# Patient Record
Sex: Male | Born: 1935 | Race: White | Hispanic: No | Smoking: Never smoker
Health system: Southern US, Community
[De-identification: ages and names within clinical notes are randomized; demographics above are authoritative.]

## PROBLEM LIST (undated history)

## (undated) DIAGNOSIS — R0601 Orthopnea: Secondary | ICD-10-CM

## (undated) DIAGNOSIS — K922 Gastrointestinal hemorrhage, unspecified: Secondary | ICD-10-CM

## (undated) DIAGNOSIS — J9610 Chronic respiratory failure, unspecified whether with hypoxia or hypercapnia: Secondary | ICD-10-CM

## (undated) DIAGNOSIS — H9190 Unspecified hearing loss, unspecified ear: Secondary | ICD-10-CM

## (undated) DIAGNOSIS — I10 Essential (primary) hypertension: Secondary | ICD-10-CM

## (undated) DIAGNOSIS — N189 Chronic kidney disease, unspecified: Secondary | ICD-10-CM

## (undated) DIAGNOSIS — I2510 Atherosclerotic heart disease of native coronary artery without angina pectoris: Secondary | ICD-10-CM

## (undated) DIAGNOSIS — R55 Syncope and collapse: Secondary | ICD-10-CM

## (undated) DIAGNOSIS — K219 Gastro-esophageal reflux disease without esophagitis: Secondary | ICD-10-CM

## (undated) DIAGNOSIS — E039 Hypothyroidism, unspecified: Secondary | ICD-10-CM

## (undated) DIAGNOSIS — E785 Hyperlipidemia, unspecified: Secondary | ICD-10-CM

## (undated) DIAGNOSIS — J449 Chronic obstructive pulmonary disease, unspecified: Secondary | ICD-10-CM

## (undated) DIAGNOSIS — M1990 Unspecified osteoarthritis, unspecified site: Secondary | ICD-10-CM

## (undated) DIAGNOSIS — D62 Acute posthemorrhagic anemia: Secondary | ICD-10-CM

## (undated) DIAGNOSIS — G4730 Sleep apnea, unspecified: Secondary | ICD-10-CM

## (undated) DIAGNOSIS — I5032 Chronic diastolic (congestive) heart failure: Secondary | ICD-10-CM

## (undated) DIAGNOSIS — J45909 Unspecified asthma, uncomplicated: Secondary | ICD-10-CM

## (undated) HISTORY — DX: Acute posthemorrhagic anemia: D62

## (undated) HISTORY — DX: Orthopnea: R06.01

## (undated) HISTORY — DX: Syncope and collapse: R55

## (undated) HISTORY — DX: Gastrointestinal hemorrhage, unspecified: K92.2

## (undated) HISTORY — DX: Chronic obstructive pulmonary disease, unspecified: J44.9

## (undated) HISTORY — PX: EXCISIONAL HEMORRHOIDECTOMY: SHX1541

## (undated) HISTORY — DX: Hyperlipidemia, unspecified: E78.5

## (undated) HISTORY — DX: Atherosclerotic heart disease of native coronary artery without angina pectoris: I25.10

## (undated) HISTORY — DX: Chronic diastolic (congestive) heart failure: I50.32

## (undated) HISTORY — DX: Chronic kidney disease, unspecified: N18.9

## (undated) HISTORY — DX: Essential (primary) hypertension: I10

---
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
## 1989-03-19 HISTORY — PX: KNEE ARTHROSCOPY: SHX127

## 1993-07-19 HISTORY — PX: CORONARY ARTERY BYPASS GRAFT: SHX141

## 1996-07-19 HISTORY — PX: PROSTATE SURGERY: SHX751

## 1998-02-28 ENCOUNTER — Ambulatory Visit: Admission: RE | Admit: 1998-02-28 | Discharge: 1998-02-28 | Payer: Self-pay | Admitting: Otolaryngology

## 1999-01-06 ENCOUNTER — Ambulatory Visit (HOSPITAL_COMMUNITY): Admission: RE | Admit: 1999-01-06 | Discharge: 1999-01-06 | Payer: Self-pay | Admitting: Family Medicine

## 1999-11-12 ENCOUNTER — Encounter (INDEPENDENT_AMBULATORY_CARE_PROVIDER_SITE_OTHER): Payer: Self-pay | Admitting: Specialist

## 1999-11-12 ENCOUNTER — Ambulatory Visit (HOSPITAL_COMMUNITY): Admission: RE | Admit: 1999-11-12 | Discharge: 1999-11-12 | Payer: Self-pay | Admitting: *Deleted

## 2003-07-20 HISTORY — PX: HYDROCELE EXCISION: SHX482

## 2004-03-10 ENCOUNTER — Encounter: Admission: RE | Admit: 2004-03-10 | Discharge: 2004-03-10 | Payer: Self-pay | Admitting: Surgery

## 2004-03-12 ENCOUNTER — Ambulatory Visit (HOSPITAL_COMMUNITY): Admission: RE | Admit: 2004-03-12 | Discharge: 2004-03-12 | Payer: Self-pay | Admitting: Obstetrics and Gynecology

## 2004-03-12 ENCOUNTER — Ambulatory Visit (HOSPITAL_BASED_OUTPATIENT_CLINIC_OR_DEPARTMENT_OTHER): Admission: RE | Admit: 2004-03-12 | Discharge: 2004-03-12 | Payer: Self-pay | Admitting: Obstetrics and Gynecology

## 2004-06-01 ENCOUNTER — Ambulatory Visit: Payer: Self-pay | Admitting: Cardiology

## 2004-06-25 ENCOUNTER — Ambulatory Visit (HOSPITAL_BASED_OUTPATIENT_CLINIC_OR_DEPARTMENT_OTHER): Admission: RE | Admit: 2004-06-25 | Discharge: 2004-06-25 | Payer: Self-pay | Admitting: Surgery

## 2004-06-25 ENCOUNTER — Ambulatory Visit (HOSPITAL_COMMUNITY): Admission: RE | Admit: 2004-06-25 | Discharge: 2004-06-25 | Payer: Self-pay | Admitting: Surgery

## 2004-07-19 HISTORY — PX: FUNCTIONAL ENDOSCOPIC SINUS SURGERY: SUR616

## 2004-07-19 HISTORY — PX: INGUINAL HERNIA REPAIR: SUR1180

## 2004-12-10 ENCOUNTER — Ambulatory Visit: Payer: Self-pay | Admitting: Cardiology

## 2004-12-17 ENCOUNTER — Ambulatory Visit: Payer: Self-pay

## 2005-04-02 ENCOUNTER — Ambulatory Visit (HOSPITAL_COMMUNITY): Admission: RE | Admit: 2005-04-02 | Discharge: 2005-04-03 | Payer: Self-pay | Admitting: Otolaryngology

## 2005-04-02 ENCOUNTER — Encounter (INDEPENDENT_AMBULATORY_CARE_PROVIDER_SITE_OTHER): Payer: Self-pay | Admitting: Specialist

## 2005-06-24 ENCOUNTER — Ambulatory Visit (HOSPITAL_COMMUNITY): Admission: RE | Admit: 2005-06-24 | Discharge: 2005-06-25 | Payer: Self-pay | Admitting: Otolaryngology

## 2005-06-24 ENCOUNTER — Encounter (INDEPENDENT_AMBULATORY_CARE_PROVIDER_SITE_OTHER): Payer: Self-pay | Admitting: Specialist

## 2006-01-03 ENCOUNTER — Ambulatory Visit: Payer: Self-pay | Admitting: Cardiology

## 2006-12-06 ENCOUNTER — Encounter (INDEPENDENT_AMBULATORY_CARE_PROVIDER_SITE_OTHER): Payer: Self-pay | Admitting: Otolaryngology

## 2006-12-06 ENCOUNTER — Ambulatory Visit (HOSPITAL_BASED_OUTPATIENT_CLINIC_OR_DEPARTMENT_OTHER): Admission: RE | Admit: 2006-12-06 | Discharge: 2006-12-06 | Payer: Self-pay | Admitting: Otolaryngology

## 2006-12-21 ENCOUNTER — Ambulatory Visit: Payer: Self-pay | Admitting: Cardiology

## 2007-06-21 ENCOUNTER — Ambulatory Visit (HOSPITAL_COMMUNITY): Admission: RE | Admit: 2007-06-21 | Discharge: 2007-06-21 | Payer: Self-pay | Admitting: Otolaryngology

## 2007-06-21 ENCOUNTER — Encounter (INDEPENDENT_AMBULATORY_CARE_PROVIDER_SITE_OTHER): Payer: Self-pay | Admitting: Otolaryngology

## 2007-10-10 ENCOUNTER — Ambulatory Visit: Payer: Self-pay

## 2007-12-18 ENCOUNTER — Ambulatory Visit: Payer: Self-pay | Admitting: Cardiology

## 2008-12-24 ENCOUNTER — Encounter: Payer: Self-pay | Admitting: Cardiology

## 2009-01-17 ENCOUNTER — Encounter: Payer: Self-pay | Admitting: Cardiology

## 2009-01-23 ENCOUNTER — Encounter: Payer: Self-pay | Admitting: Cardiology

## 2009-01-24 DIAGNOSIS — E669 Obesity, unspecified: Secondary | ICD-10-CM

## 2009-01-24 DIAGNOSIS — I10 Essential (primary) hypertension: Secondary | ICD-10-CM | POA: Insufficient documentation

## 2009-02-04 ENCOUNTER — Ambulatory Visit: Payer: Self-pay | Admitting: Cardiology

## 2009-12-16 ENCOUNTER — Telehealth: Payer: Self-pay | Admitting: Cardiology

## 2010-01-27 ENCOUNTER — Ambulatory Visit: Payer: Self-pay | Admitting: Cardiology

## 2010-01-27 DIAGNOSIS — I2510 Atherosclerotic heart disease of native coronary artery without angina pectoris: Secondary | ICD-10-CM | POA: Insufficient documentation

## 2010-01-27 DIAGNOSIS — I25810 Atherosclerosis of coronary artery bypass graft(s) without angina pectoris: Secondary | ICD-10-CM

## 2010-01-27 DIAGNOSIS — R0789 Other chest pain: Secondary | ICD-10-CM | POA: Insufficient documentation

## 2010-02-02 ENCOUNTER — Telehealth (INDEPENDENT_AMBULATORY_CARE_PROVIDER_SITE_OTHER): Payer: Self-pay | Admitting: *Deleted

## 2010-02-03 ENCOUNTER — Encounter: Payer: Self-pay | Admitting: Cardiology

## 2010-02-03 ENCOUNTER — Ambulatory Visit: Payer: Self-pay

## 2010-02-03 ENCOUNTER — Ambulatory Visit: Payer: Self-pay | Admitting: Cardiology

## 2010-02-03 ENCOUNTER — Encounter (HOSPITAL_COMMUNITY)
Admission: RE | Admit: 2010-02-03 | Discharge: 2010-04-07 | Payer: Self-pay | Source: Home / Self Care | Admitting: Cardiology

## 2010-02-04 ENCOUNTER — Ambulatory Visit: Payer: Self-pay

## 2010-08-03 ENCOUNTER — Encounter: Payer: Self-pay | Admitting: Cardiology

## 2010-08-04 ENCOUNTER — Encounter: Payer: Self-pay | Admitting: Physician Assistant

## 2010-08-06 ENCOUNTER — Ambulatory Visit
Admission: RE | Admit: 2010-08-06 | Discharge: 2010-08-06 | Payer: Self-pay | Source: Home / Self Care | Attending: Physician Assistant | Admitting: Physician Assistant

## 2010-08-06 ENCOUNTER — Encounter: Payer: Self-pay | Admitting: Physician Assistant

## 2010-08-06 DIAGNOSIS — R42 Dizziness and giddiness: Secondary | ICD-10-CM | POA: Insufficient documentation

## 2010-08-06 DIAGNOSIS — R0902 Hypoxemia: Secondary | ICD-10-CM | POA: Insufficient documentation

## 2010-08-06 DIAGNOSIS — R55 Syncope and collapse: Secondary | ICD-10-CM | POA: Insufficient documentation

## 2010-08-07 ENCOUNTER — Telehealth: Payer: Self-pay | Admitting: Physician Assistant

## 2010-08-10 ENCOUNTER — Encounter (INDEPENDENT_AMBULATORY_CARE_PROVIDER_SITE_OTHER): Payer: Self-pay

## 2010-08-10 ENCOUNTER — Encounter: Payer: Self-pay | Admitting: Physician Assistant

## 2010-08-10 ENCOUNTER — Ambulatory Visit (HOSPITAL_COMMUNITY)
Admission: RE | Admit: 2010-08-10 | Discharge: 2010-08-10 | Payer: Self-pay | Source: Home / Self Care | Attending: Cardiology | Admitting: Cardiology

## 2010-08-10 ENCOUNTER — Other Ambulatory Visit: Payer: Self-pay | Admitting: Physician Assistant

## 2010-08-10 ENCOUNTER — Ambulatory Visit: Admission: RE | Admit: 2010-08-10 | Discharge: 2010-08-10 | Payer: Self-pay | Source: Home / Self Care

## 2010-08-10 ENCOUNTER — Ambulatory Visit
Admission: RE | Admit: 2010-08-10 | Discharge: 2010-08-10 | Payer: Self-pay | Source: Home / Self Care | Attending: Physician Assistant | Admitting: Physician Assistant

## 2010-08-10 LAB — CBC WITH DIFFERENTIAL/PLATELET
Basophils Absolute: 0 10*3/uL (ref 0.0–0.1)
Basophils Relative: 0.4 % (ref 0.0–3.0)
Eosinophils Absolute: 0.2 10*3/uL (ref 0.0–0.7)
Eosinophils Relative: 3.6 % (ref 0.0–5.0)
HCT: 45.8 % (ref 39.0–52.0)
Lymphocytes Relative: 29.9 % (ref 12.0–46.0)
MCHC: 33.6 g/dL (ref 30.0–36.0)
MCV: 96.8 fl (ref 78.0–100.0)
Neutrophils Relative %: 57.2 % (ref 43.0–77.0)
Platelets: 203 10*3/uL (ref 150.0–400.0)
RBC: 4.73 Mil/uL (ref 4.22–5.81)
RDW: 13.8 % (ref 11.5–14.6)

## 2010-08-10 LAB — BASIC METABOLIC PANEL: Sodium: 135 mEq/L (ref 135–145)

## 2010-08-10 LAB — TSH: TSH: 1.79 u[IU]/mL (ref 0.35–5.50)

## 2010-08-11 ENCOUNTER — Encounter: Payer: Self-pay | Admitting: Physician Assistant

## 2010-08-13 ENCOUNTER — Ambulatory Visit (HOSPITAL_BASED_OUTPATIENT_CLINIC_OR_DEPARTMENT_OTHER)
Admission: RE | Admit: 2010-08-13 | Discharge: 2010-08-13 | Payer: Self-pay | Source: Home / Self Care | Attending: Critical Care Medicine | Admitting: Critical Care Medicine

## 2010-08-13 ENCOUNTER — Ambulatory Visit
Admission: RE | Admit: 2010-08-13 | Discharge: 2010-08-13 | Payer: Self-pay | Source: Home / Self Care | Attending: Critical Care Medicine | Admitting: Critical Care Medicine

## 2010-08-13 ENCOUNTER — Ambulatory Visit: Admit: 2010-08-13 | Payer: Self-pay | Admitting: Critical Care Medicine

## 2010-08-13 ENCOUNTER — Encounter: Payer: Self-pay | Admitting: Critical Care Medicine

## 2010-08-13 ENCOUNTER — Encounter: Payer: Self-pay | Admitting: Physician Assistant

## 2010-08-14 ENCOUNTER — Encounter: Payer: Self-pay | Admitting: Cardiology

## 2010-08-14 ENCOUNTER — Ambulatory Visit: Payer: Self-pay | Admitting: Internal Medicine

## 2010-08-18 ENCOUNTER — Encounter: Payer: Self-pay | Admitting: Critical Care Medicine

## 2010-08-18 ENCOUNTER — Ambulatory Visit
Admission: RE | Admit: 2010-08-18 | Discharge: 2010-08-18 | Payer: Self-pay | Source: Home / Self Care | Attending: Critical Care Medicine | Admitting: Critical Care Medicine

## 2010-08-19 ENCOUNTER — Ambulatory Visit: Payer: Self-pay | Admitting: Cardiology

## 2010-08-19 ENCOUNTER — Ambulatory Visit: Admit: 2010-08-19 | Payer: Self-pay | Admitting: Cardiology

## 2010-08-20 NOTE — Miscellaneous (Signed)
Summary: IV for Definity Echo  Clinical Lists Changes     IV 22 G angiocath (R) Wrist for Definity Echo.Keelin Neville,RN.

## 2010-08-20 NOTE — Assessment & Plan Note (Signed)
Summary: syncopal episodes/mt    Visit Type:  Follow-up Primary Provider:  Dr. Tenny Craw  CC:  PER PT HAS PASSED OUT  GLASSES WEARING AT THIS TIME ARE SCRATCHED  STATES THIS IS SECOND PAIR  FROM FALLING.  History of Present Illness: Primary Cardiologist:  Dr. Valera Castle  [name] is a 77 yo male with a h/o CAD, s/p CABG in 1995, HTN, DM2 and hyperlipidemia.  His last myoview was in 01/2010 and demonstrated an EF 55% and normal perfusion.  He presents today for evaluation of syncope.  He states he has been worked up for this before.  He states he had a syncopal episode before he last saw Dr. Daleen Squibb.  However, he reported chest pain to Dr. Daleen Squibb in July 2011.  As noted above, his myoview was negative for ischemia and he had a normal EF.  He states he typically passess out after walking for 5-10 minutes to go to his neighbor's house.  He denies assoc chest pain.  No arm or jaw pain.  He has some tingling in his left arm sometimes.  He denies coughing or wheezing.  He has slept in a recliner for years.  He denies a significant h/o smoking.  He states he has been evaluated for sleep apnea in the past.  He does not have sleep apnea but states he has a "breathing machine."  He describes a spinning sensation at times as well.  He has no warning prior to the syncopal episodes.  He states he is followed on his face several times with this.  He was apparently worked up in a hospital in [place] several years ago.  He denies knowing what types of testing was done.  Current Medications (verified): 1)  Diovan Hct 160-12.5 Mg Tabs (Valsartan-Hydrochlorothiazide) .Marland Kitchen.. 1 1/2 Tabs Every Morning 2)  Metoprolol Tartrate 100 Mg Tabs (Metoprolol Tartrate) .Marland Kitchen.. 1 Tab Two Times A Day 3)  Lipitor 10 Mg Tabs (Atorvastatin Calcium) .Marland Kitchen.. 1 Tab At Bedtime 4)  Aspirin Ec 325 Mg Tbec (Aspirin) .... Take One Tablet By Mouth Daily 5)  Multivitamins   Tabs (Multiple Vitamin) .Marland Kitchen.. 1 Tab Once Daily 6)  Bonine 25 Mg Chew (Meclizine  Hcl) .Marland Kitchen.. 1 Tab Two Times A Day 7)  Nitrostat 0.4 Mg Subl (Nitroglycerin) .Marland Kitchen.. 1 Tablet Under Tongue At Onset of Chest Pain; You May Repeat Every 5 Minutes For Up To 3 Doses.  Allergies: No Known Drug Allergies  Past History:  Past Medical History: Last updated: 01/24/2009 CAD, UNSPECIFIED SITE (ICD-414.00) HYPERTENSION, UNSPECIFIED (ICD-401.9) HYPERLIPIDEMIA-MIXED (ICD-272.4) DIABETES MELLITUS, TYPE II (ICD-250.00) OBESITY (ICD-278.00)    Past Surgical History: Reviewed history from 01/24/2009 and no changes required. CABG..1995 Frozen section basal cell margins clear, right brow frozen section negative for melanoma.  Resection of the above-mentioned lesions.   06/21/2007 .Marland Kitchen Hermelinda Medicus, M.D.  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 Hermelinda Medicus, M.D.  Functional endoscopic sinus surgery  06/24/2005 ..04/02/05.. SURGEON:  Hermelinda Medicus, M.D. Right hydrocele resection with marsupialization of sac. SURGEON:  Sandria Bales. Newman, M.D..06/25/2004 Right inguinal herniorrhaphy with mesh, marsupialization of the right scrotal hydrocele. SURGEON:  Sandria Bales. Ezzard Standing, M.D. 03/12/2004 History of prostate surgery in 1998.          Social History: Reviewed history from 01/24/2009 and no changes required. Tobacco Use - No.  Alcohol Use - no Drug Use -  no  Review of Systems       As per  the HPI.  All other systems reviewed and negative.   Vital Signs:  Patient profile:   77 year old male Height:      71 inches Weight:      306.50 pounds O2 Sat:      82 % on Room air Pulse (ortho):   78 / minute BP standing:   156 / 95  Vitals Entered By: Micki Riley CNA (August 06, 2010 3:49 PM)  O2 Flow:  Room air  Serial Vital Signs/Assessments:  Time      Position  BP       Pulse  Resp  Temp     By           Lying RA   201/123  76                    Scherrie Bateman, LPN           Sitting   152/96   [address] LPN           Standing  156/95   78                    Scherrie Bateman, LPN   Physical Exam  General:  Well nourished, well developed, in no acute distress HEENT: normal Neck: no JVD Cardiac:  normal S1, S2; RRR; no murmur Lungs:  decreased breath sounds bilat; no rale or wheezing Abd: soft, nontender, no hepatomegaly Ext: trace edema Vascular: no carotid  bruits or supraclavicular bruits Skin: warm and dry Neuro:  CNs 2-12 intact, no focal abnormalities noted    EKG  Procedure date:  08/06/2010  Findings:      normal sinus rhythm Heart rate 73 Left axis Increased artifact First degree AV block with a PR interval of 244 ms Poor R-wave progression Nonspecific ST-T wave changes No significant change when compared to prior tracing  Impression & Recommendations:  Problem # 1:  SYNCOPE AND COLLAPSE (ICD-780.2) I ambulated the patient to the office while monitoring his oxygen saturation.  His O2 sats started about 92% and dropped to  80% with walking approximately 100 feet.  I placed him on O2 at 2LPM on nasal canula.  His sats came up to 93% with this.  He did feel somewhat lightheaded.  I repeated his orthostatic blood pressures.  His blood pressures are hard to hear, but on palpation his systolic blood pressure did not drop significantly from lying to standing.  I did lay him back in a modified Hallpike maneuver.  He did have some spinning sensation with turning his head to the left.  His EKG shows sinus rhythm with a first degree AV block.  This is not significantly different.  He had normal LV function on a Myoview study in July 2011.  It appears that his syncopal episodes are related to hypoxia.  I will arrange for him to have home oxygen.  I am unsure what pulmonary diagnosis he already has.  He has what sounds like a nebulizer at home.  I will set him up for a  chest CT with contrast to rule out the possibility of pulmonary embolism.  I will also check an echocardiogram to assess his LV function as well as his RV pressures.  He will have labs  to include a basic metabolic panel, CBC, TSH and a BNP.  He will have pulmonary function tests with DLCO.  I will set him up for a 24-hour Holter to rule out the possibility of arrhythmia although this seems less likely.  I will refer him to pulmonology for further evaluation.  I will also bring him back in close followup with Dr. Daleen Squibb.  He has been advised to do no driving for now.  I discussed his case with Dr Ladona Ridgel who agreed with the above.  Problem # 2:  HYPOXEMIA (ICD-799.02) As above.  Will work up pulmonary process.  Orders: Pulmonary Referral (Pulmonary) Pulmonary Function Test (PFT) CT Scan  (CT Scan) Misc. Referral (Misc. Ref)  Problem # 3:  CAD, NATIVE VESSEL (ICD-414.01) Continue ASA.  He is not having angina.  Recent myoview negative.  Orders: EKG w/ Interpretation (93000)  Problem # 4:  HYPERTENSION, UNSPECIFIED (ICD-401.9) Elevated.  But, will continue current meds for now.  Orders: EKG w/ Interpretation (93000)  Other Orders: Holter (Holter) Echocardiogram (Echo)  Patient Instructions: 1)  Your physician recommends that you schedule a follow-up appointment in: 2 WEEKS WITH DR WALL 2)  Your physician recommends that you return for lab work ZO:[id number]  BMET BNP TSH CBC 3)  Your physician recommends that you continue on your current medications as directed. Please refer to the Current Medication list given to you today. 4)  You have been referred to PULMONARY  HYPOXIA 5)  Non-Cardiac CT scanning, (CAT scanning), is a noninvasive, special x-ray that produces cross-sectional images of the body using x-rays and a computer. CT scans help physicians diagnose and treat medical conditions. For some CT exams, a contrast material is used to enhance visibility in the area of the body being studied.  CT scans provide greater clarity and reveal more details than regular x-ray exams. 6)  Your physician has recommended that you wear a holter monitor.  Holter monitors are medical devices that record the heart's electrical activity. Doctors most often use these monitors to diagnose arrhythmias. Arrhythmias are problems with the speed or rhythm of the heartbeat. The monitor is a small, portable device. You can wear one while you do your normal daily activities. This is usually used to diagnose what is causing palpitations/syncope (passing out).24 HOUR  7)  Your physician has recommended that you have a pulmonary function test.  Pulmonary Function Tests are a group of tests that measure how well air moves in and out of your lungs. WITH DLCO 8)  Your physician has requested that you have an echocardiogram.  Echocardiography is a painless test that uses sound waves to create images of your heart. It provides your doctor with information about the size and shape of your heart and how well your heart's chambers and valves are working.  This procedure takes approximately one hour. There are no restrictions for this procedure. 9)  MAY NOT DRIVE  AT THIS TIME 10)  ALSO NEEDS EVALUATION FOR HOME 02

## 2010-08-20 NOTE — Progress Notes (Signed)
Summary: pt told to come in today   Phone Note Outgoing Call   Caller: Patient [phone] Reason for Call: Talk to Nurse Summary of Call: pt was told to come back in today-pls call Initial call taken by: Glynda Jaeger,  August 07, 2010 9:10 AM Summary of Call: CMA [facility] for ptcb to verify if he had labs done 08/06/10 or not. If not, he needs to come in today and get labs done. Danielle Rankin, CMA  August 07, 2010 9:23 AM     Follow-up for Phone Call        CMA s/w Merita Norton in our office today who states pt will be coming in on Monday to have his blood work done since he is coming in Monday to have other test done in the office, CMA states that is fine. Danielle Rankin, CMA  August 07, 2010 11:02 AM

## 2010-08-20 NOTE — Progress Notes (Signed)
Summary: Nuclear Pre-Procedure  Phone Note Outgoing Call Call back at [facility] Phone [phone]   Call placed by: Stanton Kidney, EMT-P,  February 02, 2010 2:20 PM Call placed to: Patient Action Taken: Phone Call Completed Summary of Call: Reviewed information on Myoview Information Sheet (see scanned document for further details).  Spoke with Patient.    Nuclear Med Background Indications for Stress Test: Evaluation for Ischemia, Graft Patency   History: CABG, Heart Catheterization, Myocardial Perfusion Study  History Comments: '95 CABG '09 MPS: EF=65%  Symptoms: Chest Tightness, Chest Tightness with Exertion    Nuclear Pre-Procedure Cardiac Risk Factors: Hypertension, Lipids, NIDDM, Obesity Height (in): 71

## 2010-08-20 NOTE — Progress Notes (Signed)
Summary: rx lipitor..diovan...metoprolol tart   Phone Note Refill Request Message from:  Patient on Dec 16, 2009 11:39 AM  Refills Requested: Medication #1:  LIPITOR 10 MG TABS 1 tab at bedtime  Medication #2:  DIOVAN HCT 160-12.5 MG TABS 1 1/2 tabs every morning  Medication #3:  METOPROLOL TARTRATE 100 MG TABS 1 tab two times a day [employer]  Initial call taken by: Judie Grieve,  Dec 16, 2009 11:40 AM    Prescriptions: LIPITOR 10 MG TABS (ATORVASTATIN CALCIUM) 1 tab at bedtime  #30 x 11   Entered by:   Danielle Rankin, CMA   Authorized by:   Gaylord Shih, MD, [facility]   Signed by:   Danielle Rankin, CMA on 12/16/2009   Method used:   Electronically to        [facility]  [phone]* (retail)       [address]       [place], [place]  [address]       Ph: [phone] or [phone]       Fax: [phone]   RxID:   [id number] METOPROLOL TARTRATE 100 MG TABS (METOPROLOL TARTRATE) 1 tab two times a day  #180 x 11   Entered by:   Danielle Rankin, CMA   Authorized by:   Gaylord Shih, MD, [facility]   Signed by:   Danielle Rankin, CMA on 12/16/2009   Method used:   Electronically to        [facility]  [phone]* (retail)       [address]       [place], [place]  [address]       Ph: [phone] or [phone]       Fax: [phone]   RxID:   [id number] DIOVAN HCT 160-12.5 MG TABS (VALSARTAN-HYDROCHLOROTHIAZIDE) 1 1/2 tabs every morning  #45 x 11   Entered by:   Danielle Rankin, CMA   Authorized by:   Gaylord Shih, MD, [facility]   Signed by:   Danielle Rankin, CMA on 12/16/2009   Method used:   Electronically to        [facility]  [phone]* (retail)       [address]       [place], [place]  [address]       Ph: [phone] or [phone]       Fax: [phone]   RxID:   [id number]

## 2010-08-20 NOTE — Assessment & Plan Note (Signed)
Summary: Cardiology Nuclear Study  Nuclear Med Background Indications for Stress Test: Evaluation for Ischemia, Graft Patency   History: CABG, Heart Catheterization, Myocardial Perfusion Study  History Comments: '95 CABG '09 MPS: EF=65%  Symptoms: Chest Tightness, Chest Tightness with Exertion    Nuclear Pre-Procedure Cardiac Risk Factors: Hypertension, Lipids, NIDDM, Obesity Caffeine/Decaff Intake: None NPO After: 6:00 PM Lungs: clear IV 0.9% NS with Angio Cath: 22g     IV Site: (R) Wrist IV Started by: Irean Hong RN Chest Size (in) 54     Height (in): 71 Weight (lb): 299 BMI: 41.85 Tech Comments: Held metoprolol this am.  Patient had Albuterol Inhaler 2 puffs prior to the [place].  Nuclear Med Study 1 or 2 day study:  2 day     Stress Test Type:  Eugenie Birks Reading MD:  Willa Rough, MD     Referring MD:  S.Klein Resting Radionuclide:  Technetium 48m Tetrofosmin     Resting Radionuclide Dose:  33.0 mCi  Stress Radionuclide:  Technetium 106m Tetrofosmin     Stress Radionuclide Dose:  33.0 mCi   Stress Protocol   Lexiscan: 0.4 mg   Stress Test Technologist:  Milana Na EMT-P     Nuclear Technologist:  Harlow Asa CNMT  Rest Procedure  Myocardial perfusion imaging was performed at rest 45 minutes following the intravenous administration of Myoview Technetium 40m Tetrofosmin.  Stress Procedure  The patient received IV Lexiscan 0.4 mg over 15-seconds.  Myoview injected at 30-seconds.  There were no significant changes with infusion.  Quantitative spect images were obtained after a 45 minute delay.  QPS Raw Data Images:  Normal; no motion artifact; normal heart/lung ratio. Stress Images:  No diagnostic abnormalities. Rest Images:  Same as stress Subtraction (SDS):  No evidence of ischemia. Transient Ischemic Dilatation:  .97  (Normal <1.22)  Lung/Heart Ratio:  .33  (Normal <0.45)  Quantitative Gated Spect Images QGS EDV:  126 ml QGS ESV:  57 ml QGS EF:   55 % QGS cine images:  Septal dyssynergy c/w prior CABG  Findings Normal nuclear study      Overall Impression  Exercise Capacity: Lexiscan BP Response: Normal blood pressure response. Clinical Symptoms: numbness ECG Impression: No significant ST segment change suggestive of ischemia. Overall Impression: Normal stress nuclear study.  Appended Document: Cardiology Nuclear Study excellent. no change.  Appended Document: Cardiology Nuclear Study pt's wife aware

## 2010-08-20 NOTE — Miscellaneous (Signed)
Summary: Orders Update pft charges   Clinical Lists Changes  Orders: Added new Service order of Carbon Monoxide diffusing w/capacity (94729) - Signed Added new Service order of Lung Volumes/Gas dilution or washout (94727) - Signed Added new Service order of Spirometry (Pre & Post) (94060) - Signed 

## 2010-08-20 NOTE — Assessment & Plan Note (Signed)
Summary: F1Y/ANAS  Medications Added NITROSTAT 0.4 MG SUBL (NITROGLYCERIN) 1 tablet under tongue at onset of chest pain; you may repeat every 5 minutes for up to 3 doses.      Allergies Added: NKDA  Visit Type:  1 yr f/u Primary Provider:  Dr. Tenny Craw  CC:  chest pain...sob...edema/ankles..pt states he had a pain that radiated down the left side from his face downward...says he cannot pick anything up w/left arm w/o pain.  History of Present Illness: [name] returns today for evaluation and management of his coronary artery disease.  About 3 weeks ago, while working around the yard, and chest tightness with some aching up in his left face and left shoulder. It lasted for about 10 minutes. He did not have any fresh nitroglycerin.  He's not had any recurrent symptoms since then.  He does have an aching in his left arm when he picks up something. He denies any presyncope or presyncope with use of his left arm. He sometimes has aches in his left shoulder.  He is now followed by Dr. Tenny Craw.  His blood pressure at home runs about 1:30 to 150 over about 70-80.  He denies orthopnea, PND or significant edema.  Current Medications (verified): 1)  Diovan Hct 160-12.5 Mg Tabs (Valsartan-Hydrochlorothiazide) .Marland Kitchen.. 1 1/2 Tabs Every Morning 2)  Metoprolol Tartrate 100 Mg Tabs (Metoprolol Tartrate) .Marland Kitchen.. 1 Tab Two Times A Day 3)  Lipitor 10 Mg Tabs (Atorvastatin Calcium) .Marland Kitchen.. 1 Tab At Bedtime 4)  Aspirin Ec 325 Mg Tbec (Aspirin) .... Take One Tablet By Mouth Daily 5)  Multivitamins   Tabs (Multiple Vitamin) .Marland Kitchen.. 1 Tab Once Daily 6)  Bonine 25 Mg Chew (Meclizine Hcl) .Marland Kitchen.. 1 Tab Two Times A Day  Allergies (verified): No Known Drug Allergies  Past History:  Past Medical History: Last updated: 01/24/2009 CAD, UNSPECIFIED SITE (ICD-414.00) HYPERTENSION, UNSPECIFIED (ICD-401.9) HYPERLIPIDEMIA-MIXED (ICD-272.4) DIABETES MELLITUS, TYPE II (ICD-250.00) OBESITY (ICD-278.00)    Past Surgical  History: Last updated: 01/24/2009 CABG..1995 Frozen section basal cell margins clear, right brow frozen section negative for melanoma.  Resection of the above-mentioned lesions.   06/21/2007 .Marland Kitchen Hermelinda Medicus, M.D.  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 Hermelinda Medicus, M.D.  Functional endoscopic sinus surgery  06/24/2005 ..04/02/05.. SURGEON:  Hermelinda Medicus, M.D. Right hydrocele resection with marsupialization of sac. SURGEON:  Sandria Bales. Newman, M.D..06/25/2004 Right inguinal herniorrhaphy with mesh, marsupialization of the right scrotal hydrocele. SURGEON:  Sandria Bales. Ezzard Standing, M.D. 03/12/2004 History of prostate surgery in 1998.          Family History: Last updated: 01/24/2009 noncontributory  Social History: Last updated: 01/24/2009 Tobacco Use - No.  Alcohol Use - no Drug Use - no  Risk Factors: Smoking Status: never (01/24/2009)  Review of Systems       negative other than history of present illness  Vital Signs:  Patient profile:   77 year old male Height:      71 inches Weight:      300 pounds BMI:     41.99 Pulse rate:   79 / minute Pulse rhythm:   irregular BP sitting:   162 / 90  (left arm) Cuff size:   large  Vitals Entered By: Danielle Rankin, CMA (January 27, 2010 9:35 AM)  Physical Exam  General:  obese.  chronically ill-appearing, in no acute distress Head:  normocephalic  and atraumatic Eyes:  injected, otherwise normal Neck:  Neck supple, no JVD. No masses, thyromegaly or abnormal cervical nodes. Chest Raysha Tilmon:  no deformities or breast masses noted Lungs:  Clear bilaterally to auscultation and percussion. Heart:  poorly appreciated PMI, soft S1-S2, no significant murmur. No bruits Abdomen:  markedly overweight, difficult to assess but no gross abnormality Msk:  decreased ROM.   Pulses:  one  plus over 4+ dorsalis pedis and posterior tibial. Decreased capillary refill with some dependent rubor Extremities:  trace left pedal edema and trace right pedal edema.   Neurologic:  Alert and oriented x 3. Skin:  Intact without lesions or rashes. Psych:  Normal affect.   Problems:  Medical Problems Added: 1)  Dx of Cad, Artery Bypass Graft  (ICD-414.04) 2)  Dx of Cad, Native Vessel  (ICD-414.01) 3)  Dx of Other Chest Pain  (ZOX-096.04)  EKG  Procedure date:  01/27/2010  Findings:      normal sinus rhythm first-degree block, left axis deviation, old inferior Estalene Bergey infarct, poor R-wave progression, no acute changes.  Impression & Recommendations:  Problem # 1:  OTHER CHEST PAIN (ICD-786.59) Assessment New He has had one episode of discomfort this worrisome for angina. It is no long time since his head objective assessment of his coronary disease. We'll obtain Myoview for risk stratification. Hopefully can continue medical therapy. His updated medication list for this problem includes:    Metoprolol Tartrate 100 Mg Tabs (Metoprolol tartrate) .Marland Kitchen... 1 tab two times a day    Aspirin Ec 325 Mg Tbec (Aspirin) .Marland Kitchen... Take one tablet by mouth daily    Nitrostat 0.4 Mg Subl (Nitroglycerin) .Marland Kitchen... 1 tablet under tongue at onset of chest pain; you may repeat every 5 minutes for up to 3 doses.  Orders: Nuclear Stress Test (Nuc Stress Test)  Problem # 2:  CAD, NATIVE VESSEL (ICD-414.01)  His updated medication list for this problem includes:    Metoprolol Tartrate 100 Mg Tabs (Metoprolol tartrate) .Marland Kitchen... 1 tab two times a day    Aspirin Ec 325 Mg Tbec (Aspirin) .Marland Kitchen... Take one tablet by mouth daily    Nitrostat 0.4 Mg Subl (Nitroglycerin) .Marland Kitchen... 1 tablet under tongue at onset of chest pain; you may repeat every 5 minutes for up to 3 doses.  Problem # 3:  CAD, ARTERY BYPASS GRAFT (ICD-414.04) Assessment: Unchanged  His updated medication list for this problem includes:    Metoprolol Tartrate  100 Mg Tabs (Metoprolol tartrate) .Marland Kitchen... 1 tab two times a day    Aspirin Ec 325 Mg Tbec (Aspirin) .Marland Kitchen... Take one tablet by mouth daily    Nitrostat 0.4 Mg Subl (Nitroglycerin) .Marland Kitchen... 1 tablet under tongue at onset of chest pain; you may repeat every 5 minutes for up to 3 doses.  Problem # 4:  HYPERTENSION, UNSPECIFIED (ICD-401.9)  His updated medication list for this problem includes:    Diovan Hct 160-12.5 Mg Tabs (Valsartan-hydrochlorothiazide) .Marland Kitchen... 1 1/2 tabs every morning    Metoprolol Tartrate 100 Mg Tabs (Metoprolol tartrate) .Marland Kitchen... 1 tab two times a day    Aspirin Ec 325 Mg Tbec (Aspirin) .Marland Kitchen... Take one tablet by mouth daily  Problem # 5:  OBESITY (ICD-278.00) Assessment: Unchanged  Problem # 6:  HYPERLIPIDEMIA-MIXED (ICD-272.4)  His updated medication list for this problem includes:    Lipitor 10 Mg Tabs (Atorvastatin calcium) .Marland Kitchen... 1 tab at bedtime  Other Orders: EKG w/ Interpretation (93000)  Clinical Reports Reviewed:  Nuclear Study:  10/10/2007:  Excerise capacity: Adenosine  study with no exercise  Blood Pressure response:  Clinical symptoms:  ECG impression: No significant ST segment change suggestive of ischemia  Overall impression: There is no sign of scar or ischemia.  Olga Millers, MD   12/17/2004:  Impression: Probable negative pharmacologic stress Myoview study revealing no significant stress-iduced EKG abnormalities, normal left ventricualr size, and normal overall left ventricular systolic function. By scintigraphic imaging, there was prominent physiologic apical thinning but no convincing evidence for myocardial ischemia or infarction. A minor degree of scarring at the cardiac apex could not be unequivocally excluded. When compared to a prior study performed Dec 06, 2003, there has been no significant interval change.  [place] Bing, MD, [facility]   Patient Instructions: 1)  Your physician recommends that you schedule a follow-up appointment in: 6  months with Dr. Daleen Squibb 2)  Your physician recommends that you continue on your current medications as directed. Please refer to the Current Medication list given to you today. 3)  Your physician has requested that you have an lexiscan myoview.  For further information please visit [email or web address]  Please follow instruction sheet, as given. Prescriptions: NITROSTAT 0.4 MG SUBL (NITROGLYCERIN) 1 tablet under tongue at onset of chest pain; you may repeat every 5 minutes for up to 3 doses.  #25 x 6   Entered by:   Lisabeth Devoid RN   Authorized by:   Gaylord Shih, MD, [facility]   Signed by:   Lisabeth Devoid RN on 01/27/2010   Method used:   Electronically to        [facility]  [phone]* (retail)       [address]       [place], [place]  [address]       Ph: [phone] or [phone]       Fax: [phone]   RxID:   [id number]

## 2010-08-20 NOTE — Assessment & Plan Note (Addendum)
Summary: Pulmonary Consultation   Visit Type:  Initial Consult Primary Provider/Referring Provider:  Dr. Tenny Craw  CC:  consult sob, dizziness with exertion had pftss today  was referred by Dr Delford Field, and COPD initial evaluation.  History of Present Illness: Pulmonary Consultation       This is a 77 year old man who presents for dyspnea and syncope  initial evaluation.  The patient complains of shortness of breath, chest tightness, wheezing, and cough, but denies history of diagnosed COPD, chest pain worse with breathing and coughing, mucous production, nocturnal awakening, exercise induced symptoms, and congestion.  Prior evaluation and testing has included Cxr.  The dyspnea is described as at rest, with ADL's, with walking within room, with walking one or two blocks, with walking stairs, and during the day.  Associated disease(s) include(s) productive cough and non-productive cough.  Oxygen evaluation is described as on supplemental O2; _2_ l/min rest but pt is noncompliant with oxygen.  Prior effective treatment has included short acting beta agonists.    THis pt has been passing out for  3-4 times.  The pt notes if walk across the road will pass out THe is not dyspneic at rest  or exertion.   THe pt is dizzy on exertion. The pt has tried rx oxygen at home.  He has not been compliant  Preventive Screening-Counseling & Management  Alcohol-Tobacco     Smoking Status: quit  Current Medications (verified): 1)  Diovan Hct 160-12.5 Mg Tabs (Valsartan-Hydrochlorothiazide) .Marland Kitchen.. 1 1/2 Tabs Every Morning 2)  Metoprolol Tartrate 100 Mg Tabs (Metoprolol Tartrate) .Marland Kitchen.. 1 Tab Two Times A Day 3)  Lipitor 10 Mg Tabs (Atorvastatin Calcium) .Marland Kitchen.. 1 Tab At Bedtime 4)  Aspirin Ec 325 Mg Tbec (Aspirin) .... Take One Tablet By Mouth Daily 5)  Multivitamins   Tabs (Multiple Vitamin) .Marland Kitchen.. 1 Tab Once Daily 6)  Bonine 25 Mg Chew (Meclizine Hcl) .Marland Kitchen.. 1 Tab Two Times A Day 7)  Nitrostat 0.4 Mg Subl (Nitroglycerin)  .Marland Kitchen.. 1 Tablet Under Tongue At Onset of Chest Pain; You May Repeat Every 5 Minutes For Up To 3 Doses.  Allergies (verified): No Known Drug Allergies  Past History:  Past medical, surgical, family and social histories (including risk factors) reviewed, and no changes noted (except as noted below).  Past Medical History: CAD, UNSPECIFIED SITE (ICD-414.00) HYPERTENSION, UNSPECIFIED (ICD-401.9) HYPERLIPIDEMIA-MIXED (ICD-272.4) OBESITY (ICD-278.00) Echo 07/2010: EF 60-65%; mild LAE; normal LV wall thickness  Syncope   a. Holter 08/10/2010 . . . NSR, sinus brady; no significant arrhythmias  Past Surgical History: Reviewed history from 01/24/2009 and no changes required. CABG..1995 Frozen section basal cell margins clear, right brow frozen section negative for melanoma.  Resection of the above-mentioned lesions.   06/21/2007 .Marland Kitchen Hermelinda Medicus, M.D.  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 Hermelinda Medicus, M.D.  Functional endoscopic sinus surgery  06/24/2005 ..04/02/05.. SURGEON:  Hermelinda Medicus, M.D. Right hydrocele resection with marsupialization of sac. SURGEON:  Sandria Bales. Newman, M.D..06/25/2004 Right inguinal herniorrhaphy with mesh, marsupialization of the right scrotal hydrocele. SURGEON:  Sandria Bales. Ezzard Standing, M.D. 03/12/2004 History of prostate surgery in 1998.          Family History: Reviewed history from 01/24/2009 and no changes required. noncontributory  Social History: Reviewed history from 01/24/2009 and no changes required. Tobacco Use - No.  Alcohol Use - no Drug Use - no  Occupation- retired Environmental consultant equipSmoking Status:  quit  Review of Systems       The patient complains of shortness of breath with activity, shortness of breath at rest, productive cough, indigestion, difficulty swallowing,  itching, and hand/feet swelling.  The patient denies non-productive cough, coughing up blood, chest pain, irregular heartbeats, loss of appetite, weight change, abdominal pain, sore throat, tooth/dental problems, headaches, nasal congestion/difficulty breathing through nose, sneezing, ear ache, anxiety, depression, joint stiffness or pain, rash, change in color of mucus, and fever.         pt remains light headed and dizzy      See HPI for Pulmonary, Cardiac, General, and ENT review of systems.  Vital Signs:  Patient profile:   77 year old male Height:      71 inches Weight:      306 pounds BMI:     42.83 O2 Sat:      95 % on Room air Pulse rate:   80 / minute BP sitting:   150 / 100  (right arm) Cuff size:   large  Vitals Entered By: Gweneth Dimitri RN (August 13, 2010 4:08 PM)  O2 Flow:  Room air  Physical Exam  Additional Exam:  Gen: Pleasant, well-nourished, in no distress,  normal affect ENT: No lesions,  mouth clear,  oropharynx clear, no postnasal drip Neck: No JVD, no TMG, no carotid bruits Lungs: No use of accessory muscles, no dullness to percussion, distant BS  Cardiovascular: RRR, heart sounds normal, no murmur or gallops, no peripheral edema Abdomen: soft and NT, no HSM,  BS norma Musculoskeletal: No deformities, no cyanosis or clubbing Neuro: alert, non focal Skin: Warm, no lesions or rashes    CXR  Procedure date:  08/13/2010  Findings:      Findings: Mild cardiac enlargement.   Status post median sternotomy and CABG procedure.   No airspace consolidation identified.   There is diffuse pulmonary venous congestion and left basilar atelectasis.   IMPRESSION:   1.  Mild cardiac enlargement and pulmonary venous congestion. 2.  Left basilar atelectasis.   Read By:  Rosealee Albee,  M.D.     Released By:  Rosealee Albee,  M.D.   Signed by Storm Frisk MD on 08/13/2010 at 5:29 PM  _____________________  CT of Chest  Procedure date:   08/14/2010  Findings:      Findings:  No filling defect is identified in the pulmonary arterial tree to suggest pulmonary embolus.   Prior CABG noted.  There is mild cardiomegaly.   An AP window lymph node has a short axis diameter of 8 mm.   A 5 mm in diameter pulmonary nodules present the right lower lobe on image 37 of series 6.   There is evidence of old granulomatous disease along the left major fissure.   Review of the MIP images confirms the above findings.   IMPRESSION:   1.  No embolus identified. 2.  5 mm right lower lobe pulmonary nodule. If the patient is at high risk for bronchogenic carcinoma, follow-up chest CT at 6-12 months is recommended.  If the patient is at low risk for bronchogenic carcinoma, follow-up chest CT at 12 months is recommended.  This recommendation follows the consensus statement: Guidelines for Management of Small Pulmonary Nodules Detected on CT Scans: A Statement from the Fleischner Society as published in Radiology 2005; 237:395-400. Online at: DietDisorder.cz. 3.  Mild cardiomegaly and prior CABG.  Impression & Recommendations:  Problem #  1:  COPD (ICD-496) Assessment Deteriorated Severe copd with significant airway obstruction, exertional desat to dangerously low levels and  extremely high dosing of non selective beta blockade note CT chest neg for PE note pfts show combined severe obstruction and restriction due to obesity plan d/c metoprolol start bystolic 20mg  /d Prednisone 4 each am x 4 days, 3 x 4 days, 2 x 4 days, 1 x 4 days then stop USe advair 250 one puff two times a day Use oxygen 3L rest  4 L exertion, will call [facility] note cxr obtained today revealed no active disease  Return one week  Medications Added to Medication List This Visit: 1)  Bystolic 20 Mg Tabs (Nebivolol hcl) .... One by mouth daily 2)  Advair Diskus 250-50 Mcg/dose Misc (Fluticasone-salmeterol) .... One puff twice  daily 3)  Prednisone 10 Mg Tabs (Prednisone) .... Take as directed 4 each am x 4 days, 3 x 4 days, 2 x 4 days, 1 x 4 days then stop 4)  Oxygen  .Marland Kitchen.. 3l rest ;  4l exertion  Complete Medication List: 1)  Diovan Hct 160-12.5 Mg Tabs (Valsartan-hydrochlorothiazide) .Marland Kitchen.. 1 1/2 tabs every morning 2)  Bystolic 20 Mg Tabs (Nebivolol hcl) .... One by mouth daily 3)  Lipitor 10 Mg Tabs (Atorvastatin calcium) .Marland Kitchen.. 1 tab at bedtime 4)  Aspirin Ec 325 Mg Tbec (Aspirin) .... Take one tablet by mouth daily 5)  Multivitamins Tabs (Multiple vitamin) .Marland Kitchen.. 1 tab once daily 6)  Bonine 25 Mg Chew (Meclizine hcl) .Marland Kitchen.. 1 tab two times a day 7)  Nitrostat 0.4 Mg Subl (Nitroglycerin) .Marland Kitchen.. 1 tablet under tongue at onset of chest pain; you may repeat every 5 minutes for up to 3 doses. 8)  Advair Diskus 250-50 Mcg/dose Misc (Fluticasone-salmeterol) .... One puff twice daily 9)  Prednisone 10 Mg Tabs (Prednisone) .... Take as directed 4 each am x 4 days, 3 x 4 days, 2 x 4 days, 1 x 4 days then stop 10)  Oxygen  .Marland Kitchen.. 3l rest ;  4l exertion  Other Orders: New Patient Level V (40981) T-2 View CXR (71020TC) DME Referral (DME) .Ambulatory Pulse Oximetry  Resting; HR__81   ___    02 Sat_93____  Lap1 (185 feet)   HR_93____   02 Sat_83____ Lap2 (185 feet)   HR_____   02 Sat_____    Lap3 (185 feet)   HR_____   02 Sat_____  ___Test Completed without Difficulty __x _Test Stopped due to: pt sob, and desat to 83%, pt put on 3 l o2 recovered to 94 sats hr 80 .Kandice Hams CMA  August 13, 2010 5:29 PM    Patient Instructions: 1)  Prednisone 4 each am x 4 days, 3 x 4 days, 2 x 4 days, 1 x 4 days then stop 2)  USe advair 250 one puff two times a day 3)  Use oxygen 3L rest  4 L exertion, will call [facility] 4)  Stop metoprolol 5)  Start bystolic 20mg  daily  6)  Chest xray today 7)  Return one week Prescriptions: PREDNISONE 10 MG  TABS (PREDNISONE) Take as directed 4 each am x 4 days, 3 x 4 days, 2 x 4 days, 1 x 4 days then  stop  #40 x 0   Entered and Authorized by:   Storm Frisk MD   Signed by:   Storm Frisk MD on 08/13/2010   Method used:   Electronically to        [facility]  [phone]* (retail)       [address]       [place], [place]  [address]       Ph: [phone] or [phone]       Fax: [phone]   RxID:   [id number] ADVAIR DISKUS 250-50 MCG/DOSE  MISC (FLUTICASONE-SALMETEROL) One puff twice daily  #1 x 6   Entered and Authorized by:   Storm Frisk MD   Signed by:   Storm Frisk MD on 08/13/2010   Method used:   Electronically to        [facility]  [phone]* (retail)       [address]       [place], [place]  [address]       Ph: [phone] or [phone]       Fax: [phone]   RxID:   [id number] BYSTOLIC 20 MG TABS (NEBIVOLOL HCL) one by mouth daily  #30 x 6   Entered and Authorized by:   Storm Frisk MD   Signed by:   Storm Frisk MD on 08/13/2010   Method used:   Electronically to        [facility]  [phone]* (retail)       [address]       [place], [place]  [address]       Ph: [phone] or [phone]       Fax: [phone]   RxID:   [id number]

## 2010-08-20 NOTE — Miscellaneous (Signed)
  Clinical Lists Changes  Observations: Added new observation of PAST MED HX: CAD, UNSPECIFIED SITE (ICD-414.00) HYPERTENSION, UNSPECIFIED (ICD-401.9) HYPERLIPIDEMIA-MIXED (ICD-272.4) DIABETES MELLITUS, TYPE II (ICD-250.00) OBESITY (ICD-278.00) Echo 07/2010: EF 60-65%; mild LAE; normal LV wall thickness   (08/10/2010 17:35)       Past History:  Past Medical History: CAD, UNSPECIFIED SITE (ICD-414.00) HYPERTENSION, UNSPECIFIED (ICD-401.9) HYPERLIPIDEMIA-MIXED (ICD-272.4) DIABETES MELLITUS, TYPE II (ICD-250.00) OBESITY (ICD-278.00) Echo 07/2010: EF 60-65%; mild LAE; normal LV wall thickness

## 2010-08-20 NOTE — Miscellaneous (Signed)
  Clinical Lists Changes  Observations: Added new observation of NUCLEAR NOS: Exercise Capacity: Lexiscan BP Response: Normal blood pressure response. Clinical Symptoms: numbness ECG Impression: No significant ST segment change suggestive of ischemia. Overall Impression: Normal stress nuclear study.  (01/25/2011 14:06)      Nuclear Study  Procedure date:  01/25/2011  Findings:      Exercise Capacity: Lexiscan BP Response: Normal blood pressure response. Clinical Symptoms: numbness ECG Impression: No significant ST segment change suggestive of ischemia. Overall Impression: Normal stress nuclear study.

## 2010-08-20 NOTE — Miscellaneous (Signed)
Summary: Orders Update  Clinical Lists Changes  Orders: Added new Referral order of CT Scan  (CT Scan) - Signed 

## 2010-08-20 NOTE — Miscellaneous (Signed)
  Clinical Lists Changes  Observations: Added new observation of PAST MED HX: CAD, UNSPECIFIED SITE (ICD-414.00) HYPERTENSION, UNSPECIFIED (ICD-401.9) HYPERLIPIDEMIA-MIXED (ICD-272.4) DIABETES MELLITUS, TYPE II (ICD-250.00) OBESITY (ICD-278.00) Echo 07/2010: EF 60-65%; mild LAE; normal LV wall thickness  Syncope   a. Holter 08/10/2010 . . . NSR, sinus brady; no significant arrhythmias  (08/13/2010 9:50)       Past History:  Past Medical History: CAD, UNSPECIFIED SITE (ICD-414.00) HYPERTENSION, UNSPECIFIED (ICD-401.9) HYPERLIPIDEMIA-MIXED (ICD-272.4) DIABETES MELLITUS, TYPE II (ICD-250.00) OBESITY (ICD-278.00) Echo 07/2010: EF 60-65%; mild LAE; normal LV wall thickness  Syncope   a. Holter 08/10/2010 . . . NSR, sinus brady; no significant arrhythmias  Appended Document:     Clinical Lists Changes  Observations: Added new observation of PAST MED HX: CAD, UNSPECIFIED SITE (ICD-414.00) HYPERTENSION, UNSPECIFIED (ICD-401.9) HYPERLIPIDEMIA-MIXED (ICD-272.4) DIABETES MELLITUS, TYPE II (ICD-250.00) OBESITY (ICD-278.00) Echo 07/2010: EF 60-65%; mild LAE; normal LV wall thickness  Syncope   a. Holter 08/10/2010 . . . NSR, sinus brady; no significant arrhythmias PFTs 07/2010: mod obstructive defect; mod restrictive defect; diff. capacity normal; + response to bronchodilators (08/14/2010 13:34)        Past History:  Past Medical History: CAD, UNSPECIFIED SITE (ICD-414.00) HYPERTENSION, UNSPECIFIED (ICD-401.9) HYPERLIPIDEMIA-MIXED (ICD-272.4) DIABETES MELLITUS, TYPE II (ICD-250.00) OBESITY (ICD-278.00) Echo 07/2010: EF 60-65%; mild LAE; normal LV wall thickness  Syncope   a. Holter 08/10/2010 . . . NSR, sinus brady; no significant arrhythmias PFTs 07/2010: mod obstructive defect; mod restrictive defect; diff. capacity normal; + response to bronchodilators

## 2010-08-26 NOTE — Procedures (Signed)
Summary: summary report  summary report   Imported By: Mirna Mires 08/18/2010 10:09:31  _____________________________________________________________________  External Attachment:    Type:   Image     Comment:   External Document

## 2010-08-26 NOTE — Assessment & Plan Note (Addendum)
Summary: Pulmonary OV   Primary Provider/Referring Provider:  Dr. Tenny Craw  CC:  1 wk follow up.  Pt states breathing and sleeping are better.  Occas chest tightness, wheezing, and Coughing at times - prod with yellow mucus..  History of Present Illness: Pulmonary Consultation       This is a 77 year old man who presents for dyspnea and syncope  initial evaluation.  The patient complains of shortness of breath, chest tightness, wheezing, and cough, but denies history of diagnosed COPD, chest pain worse with breathing and coughing, mucous production, nocturnal awakening, exercise induced symptoms, and congestion.  Prior evaluation and testing has included Cxr.  The dyspnea is described as at rest, with ADL's, with walking within room, with walking one or two blocks, with walking stairs, and during the day.  Associated disease(s) include(s) productive cough and non-productive cough.  Oxygen evaluation is described as on supplemental O2; _2_ l/min rest but pt is noncompliant with oxygen.  Prior effective treatment has included short acting beta agonists.    THis pt has been passing out for  3-4 times.  The pt notes if walk across the road will pass out THe is not dyspneic at rest  or exertion.   THe pt is dizzy on exertion. The pt has tried rx oxygen at home.  He has not been compliant  August 18, 2010 3:27 PM notes breathing is better CT angio was neg for PE pfts showed obstruction and restriction no headaches   Preventive Screening-Counseling & Management  Alcohol-Tobacco     Smoking Status: never     Passive Smoke Exposure: yes  Comments: wife was heavy smoker ,  now has quit smoking   Current Medications (verified): 1)  Diovan Hct 160-12.5 Mg Tabs (Valsartan-Hydrochlorothiazide) .Marland Kitchen.. 1 1/2 Tabs Every Morning 2)  Bystolic 20 Mg Tabs (Nebivolol Hcl) .... One By Mouth Daily 3)  Lipitor 10 Mg Tabs (Atorvastatin Calcium) .Marland Kitchen.. 1 Tab At Bedtime 4)  Aspirin Ec 325 Mg Tbec (Aspirin) ....  Take One Tablet By Mouth Daily 5)  Multivitamins   Tabs (Multiple Vitamin) .Marland Kitchen.. 1 Tab Once Daily 6)  Bonine 25 Mg Chew (Meclizine Hcl) .Marland Kitchen.. 1 Tab Two Times A Day 7)  Nitrostat 0.4 Mg Subl (Nitroglycerin) .Marland Kitchen.. 1 Tablet Under Tongue At Onset of Chest Pain; You May Repeat Every 5 Minutes For Up To 3 Doses. 8)  Advair Diskus 250-50 Mcg/dose  Misc (Fluticasone-Salmeterol) .... One Puff Twice Daily 9)  Prednisone 10 Mg  Tabs (Prednisone) .... Take As Directed 4 Each Am X 4 Days, 3 X 4 Days, 2 X 4 Days, 1 X 4 Days Then Stop 10)  Oxygen .Marland Kitchen.. 3l Rest ;  4l Exertion  Allergies (verified): No Known Drug Allergies  Social History: Smoking Status:  never Passive Smoke Exposure:  yes  Review of Systems       The patient complains of shortness of breath with activity.  The patient denies shortness of breath at rest, productive cough, non-productive cough, coughing up blood, chest pain, irregular heartbeats, acid heartburn, indigestion, loss of appetite, weight change, abdominal pain, difficulty swallowing, sore throat, tooth/dental problems, headaches, nasal congestion/difficulty breathing through nose, sneezing, itching, ear ache, anxiety, depression, hand/feet swelling, joint stiffness or pain, rash, change in color of mucus, and fever.    Vital Signs:  Patient profile:   77 year old male Height:      71 inches Weight:      318.13 pounds BMI:     44.53 O2  Sat:      91 % on 2 L/minpulsed Temp:     97.5 degrees F oral Pulse rate:   81 / minute BP sitting:   144 / 92  (left arm) Cuff size:   large  Vitals Entered By: Gweneth Dimitri RN (August 18, 2010 3:21 PM)  O2 Flow:  2 L/minpulsed CC: 1 wk follow up.  Pt states breathing and sleeping are better.  Occas chest tightness, wheezing,  Coughing at times - prod with yellow mucus. Comments Medications reviewed with patient Daytime contact number verified with patient. Gweneth Dimitri RN  August 18, 2010 3:21 PM    Physical Exam  Additional  Exam:  Gen: Pleasant, well-nourished, in no distress,  normal affect ENT: No lesions,  mouth clear,  oropharynx clear, no postnasal drip Neck: No JVD, no TMG, no carotid bruits Lungs: No use of accessory muscles, no dullness to percussion, distant BS  Cardiovascular: RRR, heart sounds normal, no murmur or gallops, no peripheral edema Abdomen: soft and NT, no HSM,  BS norma Musculoskeletal: No deformities, no cyanosis or clubbing Neuro: alert, non focal Skin: Warm, no lesions or rashes    Impression & Recommendations:  Problem # 1:  COPD (ICD-496) Assessment Improved  Severe copd with significant airway obstruction, exertional desat to dangerously low levels and  extremely high dosing of non selective beta blockade, now improved with change to bystolic.  now on oxygen therapy note CT chest neg for PE note pfts show combined severe obstruction and restriction due to obesity plan stay on oxygen cont advair finish pred rx  Complete Medication List: 1)  Diovan Hct 160-12.5 Mg Tabs (Valsartan-hydrochlorothiazide) .Marland Kitchen.. 1 1/2 tabs every morning 2)  Bystolic 20 Mg Tabs (Nebivolol hcl) .... One by mouth daily 3)  Lipitor 10 Mg Tabs (Atorvastatin calcium) .Marland Kitchen.. 1 tab at bedtime 4)  Aspirin Ec 325 Mg Tbec (Aspirin) .... Take one tablet by mouth daily 5)  Multivitamins Tabs (Multiple vitamin) .Marland Kitchen.. 1 tab once daily 6)  Bonine 25 Mg Chew (Meclizine hcl) .Marland Kitchen.. 1 tab two times a day 7)  Nitrostat 0.4 Mg Subl (Nitroglycerin) .Marland Kitchen.. 1 tablet under tongue at onset of chest pain; you may repeat every 5 minutes for up to 3 doses. 8)  Advair Diskus 250-50 Mcg/dose Misc (Fluticasone-salmeterol) .... One puff twice daily 9)  Prednisone 10 Mg Tabs (Prednisone) .... Take as directed 4 each am x 4 days, 3 x 4 days, 2 x 4 days, 1 x 4 days then stop 10)  Oxygen  .Marland Kitchen.. 3l rest ;  4l exertion  Other Orders: Est. Patient Level IV (16109)  Patient Instructions: 1)  No change in medications 2)  Finish  prednisone 3)  Return in     2     months

## 2010-08-26 NOTE — Miscellaneous (Signed)
Summary: PFTs   Pulmonary Function Test Date: 08/13/2010 Height (in.): 71 Gender: Male  Pre-Spirometry FVC    Value: 1.96 L/min   Pred: 4.34 L/min     % Pred: 45 % FEV1    Value: 1.45 L     Pred: 2.81 L     % Pred: 52 % FEV1/FVC  Value: 74 %     Pred: 66 %    FEF 25-75  Value: 1.07 L/min   Pred: 2.39 L/min     % Pred: 45 %  Post-Spirometry FVC    Value: 2.08 L/min   Pred: 4.34 L/min     % Pred: 48 % FEV1    Value: 1.57 L     Pred: 2.81 L     % Pred: 56 % FEV1/FVC  Value: 76 %     Pred: 66 %    FEF 25-75  Value: 1.28 L/min   Pred: 2.39 L/min     % Pred: 54 %  Lung Volumes TLC    Value: 3.73 L   % Pred: 56 % RV    Value: 1.69 L   % Pred: 62 % DLCO    Value: 22.2 %   % Pred: 80 % DLCO/VA  Value: 6.94 %   % Pred: 207 %  Comments: combined obstruction and restriction  Clinical Lists Changes  Observations: Added new observation of PFT COMMENTS: combined obstruction and restriction (08/18/2010 9:17) Added new observation of DLCO/VA%EXP: 207 % (08/18/2010 9:17) Added new observation of DLCO/VA: 6.94 % (08/18/2010 9:17) Added new observation of DLCO % EXPEC: 80 % (08/18/2010 9:17) Added new observation of DLCO: 22.2 % (08/18/2010 9:17) Added new observation of RV % EXPECT: 62 % (08/18/2010 9:17) Added new observation of RV: 1.69 L (08/18/2010 9:17) Added new observation of TLC % EXPECT: 56 % (08/18/2010 9:17) Added new observation of TLC: 3.73 L (08/18/2010 9:17) Added new observation of FEF2575%EXPS: 54 % (08/18/2010 9:17) Added new observation of PSTFEF25/75P: 2.39  (08/18/2010 9:17) Added new observation of PSTFEF25/75%: 1.28 L/min (08/18/2010 9:17) Added new observation of FEV1FVCPRDPS: 66 % (08/18/2010 9:17) Added new observation of PSTFEV1/FVC: 76 % (08/18/2010 9:17) Added new observation of POSTFEV1%PRD: 56 % (08/18/2010 9:17) Added new observation of FEV1PRDPST: 2.81 L (08/18/2010 9:17) Added new observation of POST FEV1: 1.57 L/min (08/18/2010 9:17) Added new  observation of POST FVC%EXP: 48 % (08/18/2010 9:17) Added new observation of FVCPRDPST: 4.34 L/min (08/18/2010 9:17) Added new observation of POST FVC: 2.08 L (08/18/2010 9:17) Added new observation of FEF % EXPEC: 45 % (08/18/2010 9:17) Added new observation of FEF25-75%PRE: 2.39 L/min (08/18/2010 9:17) Added new observation of FEF 25-75%: 1.07 L/min (08/18/2010 9:17) Added new observation of FEV1/FVC PRE: 66 % (08/18/2010 9:17) Added new observation of FEV1/FVC: 74 % (08/18/2010 9:17) Added new observation of FEV1 % EXP: 52 % (08/18/2010 9:17) Added new observation of FEV1 PREDICT: 2.81 L (08/18/2010 9:17) Added new observation of FEV1: 1.45 L (08/18/2010 9:17) Added new observation of FVC % EXPECT: 45 % (08/18/2010 9:17) Added new observation of FVC PREDICT: 4.34 L (08/18/2010 9:17) Added new observation of FVC: 1.96 L (08/18/2010 9:17) Added new observation of PFT HEIGHT: 71  (08/18/2010 9:17) Added new observation of PFT DATE: 08/13/2010  (08/18/2010 9:17)

## 2010-09-03 NOTE — Medication Information (Signed)
Summary: Physician's Orders   Physician's Orders   Imported By: Roderic Ovens 08/27/2010 09:44:23  _____________________________________________________________________  External Attachment:    Type:   Image     Comment:   External Document

## 2010-09-15 NOTE — Letter (Signed)
[facility]: [facility]   Imported By: Marylou Mccoy 09/07/2010 10:04:30  _____________________________________________________________________  External Attachment:    Type:   Image     Comment:   External Document

## 2010-09-29 ENCOUNTER — Encounter (INDEPENDENT_AMBULATORY_CARE_PROVIDER_SITE_OTHER): Payer: Self-pay | Admitting: *Deleted

## 2010-09-29 NOTE — Letter (Signed)
Summary: [name] - Office Note  [facility] - Office Note   Imported By: Marylou Mccoy 09/24/2010 15:49:08  _____________________________________________________________________  External Attachment:    Type:   Image     Comment:   External Document

## 2010-10-06 NOTE — Letter (Signed)
Summary: Appointment - Missed  [facility], Main Office  [address]   [place], [place] [address]   Phone: [phone]  Fax: [phone]     September 29, 2010 MRN: [id number]   [name] [address] [place], [place]  [address]   Dear [name],  Our records indicate you missed your appointment on 08-19-2010   with Dr.  Daleen Squibb  .                                    It is very important that we reach you to reschedule this appointment. We look forward to participating in your health care needs. Please contact us at the number listed above at your earliest convenience to reschedule this appointment.     Sincerely,      Lorne Skeens   [facility] Scheduling Team

## 2010-10-13 ENCOUNTER — Encounter: Payer: Self-pay | Admitting: Critical Care Medicine

## 2010-10-16 ENCOUNTER — Ambulatory Visit (INDEPENDENT_AMBULATORY_CARE_PROVIDER_SITE_OTHER): Payer: MEDICARE | Admitting: Adult Health

## 2010-10-16 ENCOUNTER — Ambulatory Visit: Payer: MEDICARE | Admitting: Critical Care Medicine

## 2010-10-16 ENCOUNTER — Encounter: Payer: Self-pay | Admitting: Adult Health

## 2010-10-16 DIAGNOSIS — R0902 Hypoxemia: Secondary | ICD-10-CM

## 2010-10-16 DIAGNOSIS — J449 Chronic obstructive pulmonary disease, unspecified: Secondary | ICD-10-CM

## 2010-10-16 NOTE — Patient Instructions (Signed)
Continue on Advair 1 puff Twice daily  -Brush /rinse and gargle after use.  Please wear Oxygen 3 l/m rest and 4 l/m with walking.  Low salt diet.  Advance activity as tolerated- weight loss will really help you.  follow up Dr. Delford Field in 3 months

## 2010-10-16 NOTE — Assessment & Plan Note (Addendum)
Compensated on present regimen. He is a never smoker with severe chronic obstruction Encouraged compliance with meds.   Plan Cont advair.  Samples given.  follow up Dr. Delford Field  In 3 months

## 2010-10-16 NOTE — Progress Notes (Signed)
Subjective:    Patient ID: [name], male    DOB: 02-Jan-1931, 77 y.o.   MRN: [id number]  HPI Comments:  77 year old man with hx of Asthma   08/13/10 -Initial pulmonary consult. Presents for dyspnea and syncope  initial evaluation.  The patient complains of shortness of breath, chest tightness, wheezing, and cough, but denies history of diagnosed COPD, chest pain worse with breathing and coughing, mucous production, nocturnal awakening, exercise induced symptoms, and congestion.  Prior evaluation and testing has included Cxr.  The dyspnea is described as at rest, with ADL's, with walking within room, with walking one or two blocks, with walking stairs, and during the day.  Associated disease(s) include(s) productive cough and non-productive cough.  Oxygen evaluation is described as on supplemental O2; _2_ l/min rest but pt is noncompliant with oxygen.  Prior effective treatment has included short acting beta agonists.     THis pt has been passing out for  3-4 times.  The pt notes if walk across the road will pass out THe is not dyspneic at rest  or exertion.    THe pt is dizzy on exertion. The pt has tried rx oxygen at home.  He has not been compliant   August 18, 2010 3:27 PM notes breathing is better CT angio was neg for PE pfts showed obstruction and restriction no headaches   10/16/2010 Follow up -Returns for 2 months follow up for COPD . His dyspnea is about the same - OCcas prod cough (clear, yellow) .  He is feeling better.  He is not as weak since he was changed to [employer]. Dizziness/syncope has totally resolved.  He has more endurance , can walk farther now. Can walk 1/4 mile without stopping. He admits he does not always wear O2 when walking. Wears as needed during daytime , but wears all night.  Does tell me he does not take Advair regularly due to cost factor. He appreciates samples. He does get some ankle edema worse in evening. Explained that wearing O2 may help with walking.  He desated to 88% with walking from lobby to exam  room on room air.      Review of Systems   Constitutional:   No  weight loss, night sweats,  Fevers, chills, fatigue, lassitude. HEENT:   No headaches,  Difficulty swallowing,  Tooth/dental problems,  Sore throat,                No sneezing, itching, ear ache, nasal congestion, post nasal drip,   CV:  No chest pain,  Orthopnea, PND,  anasarca, dizziness, palpitations  GI  No heartburn, indigestion, abdominal pain, nausea, vomiting, diarrhea, change in bowel habits, loss of appetite  Resp: No shortness of breath with exertion or at rest.  No excess mucus, no productive cough,  No non-productive cough,  No coughing up of blood.  No change in color of mucus.  No wheezing.  No chest wall deformity Neg snoring or hypersolomence  Skin: no rash or lesions.  GU: no dysuria, change in color of urine, no urgency or frequency.  No flank pain.  MS:  No joint pain or swelling.  No decreased range of motion.  No back pain.  Psych:  No change in mood or affect. No depression or anxiety.  No memory loss.  Objective:   Physical Exam Gen: Pleasant, obese , in no distress,  normal affect  ENT: No lesions,  mouth clear,  oropharynx clear, no postnasal drip, class  2 airway   Neck: No JVD, no TMG, no carotid bruits  Lungs: No use of accessory muscles, no dullness to percussion, clear without rales or rhonchi, Diminshed BS in bases   Cardiovascular: RRR, heart sounds normal, no murmur or gallops, 1+ peripheral edema  Abdomen: soft and NT, no HSM,  BS normal, obese   Musculoskeletal: No deformities, no cyanosis or clubbing  Neuro: alert, non focal  Skin: Warm, no lesions or rashes          Assessment & Plan:

## 2010-10-16 NOTE — Progress Notes (Signed)
Noted and agree. 

## 2010-10-16 NOTE — Assessment & Plan Note (Addendum)
Advised w/ O2 compliance  Improved compliance may help peripheral edema.  Low salt diet  Screened for OSA- he denies snoring or daytime sleepiness- would cont to monitor , he several risk factors

## 2010-12-01 NOTE — H&P (Signed)
NAMESEBASTIN, [name]              ACCOUNT NO.:  [id number]   MEDICAL RECORD NO.:  [id number]          PATIENT TYPE:  AMB   LOCATION:  SDS                          FACILITY:  [facility]   PHYSICIAN:  Hermelinda Medicus, M.D.   DATE OF BIRTH:  [birth year]   DATE OF ADMISSION:  06/21/2007  DATE OF DISCHARGE:                              HISTORY & PHYSICAL   This patient is a 77 year old patient, a male who has had considerable  sun exposure in the past and has had several lesions he was showing and  his superior back and also on his scalp, temporal region, and neck.  He  now enters for these to be removed.  Some appear to be possible  seborrheic keratosis, others I believed to be malignant lesions.  The  list of the lesions to be removed are right superior back, left back,  right brow, possible melanoma, right eye, upper lid, right temporal  region, right post auricular, right supraclavicular, left back  superiorly, and left post-auricularly.  He has a history of having had a  CABG in 1995, three vessels and has done very well.  His EKG is stable.  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  He had prostate  surgery in 1998, and he does have a few cells, will be treated for a  urinary tract infection.  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  Also, this was reevaluated and will be observed carefully,  and then he had a basal cell tumor from the tip of his nose removed in  2008.   PAST MEDICAL HISTORY:  His meds are in the chart, and HE HAS NO  ALLERGIES TO MEDICATION.   MEDICATIONS:  1. Metoprolol 100 mg twice daily.  2. Diovan HCT of 160/12.5 daily.  3. Lipitor 10 daily.  4. Ecotrin 325 daily.  He can continue these.   ALLERGIES:  HE SAYS HIS ONLY ALLERGIES ACTUALLY ARE CREAM CHEESE, BUT NO  ALLERGIES TO MEDICATION.   SOCIAL HISTORY:  He does not smoke and does not drink.   PHYSICAL  EXAMINATION:  VITAL SIGNS:  His physical examination reveals a  blood pressure of 150/80.  His pulse is 80.  HEENT:  His ears are clear.  Tympanic membranes are clear.  Oral cavity  is clear.  He has lesions on the above-mentioned areas of right and left  back, right and left post-auricular, right brow, right temporal, right  upper eyelid lateral, right supraclavicular.  The plan is for excision  of these with frozen sections of the most suspicious ones, the right  back and the right temporal region.  CHEST:  Further physical examination reveals the chest to be clear, the  oral cavity to be clear.  The larynx is clear.  NECK:  Free of any thyromegaly, cervical adenopathy or mass, except for  these lesions above-mentioned.  HEART:  Within normal limits.  No irregular beats or murmurs; however,  he does have the CABG incision.  ABDOMEN:  Obese.  EXTREMITIES:  Essentially unremarkable.   INITIAL DIAGNOSES:  1. Multiple skin lesions history of CABG in 1995.  2. History of hernia surgery.  3. History of scrotal surgery in 2005.  4. History of sinus surgery in 2006 and 2008, a basal cell tumor of      the nose.  5. 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  6. History of hernia repair in 2006.  7. History of prostate surgery in 1998.           ______________________________  Hermelinda Medicus, M.D.     JC/MEDQ  D:  06/21/2007  T:  06/21/2007  Job:  [id number]   cc:   [name]. Daleen Squibb, MD, [facility]  [name], M.D.

## 2010-12-01 NOTE — Assessment & Plan Note (Signed)
[facility]                            CARDIOLOGY OFFICE NOTE   NAME:Sanchez, Fred Starks                     MRN:          [id number]  DATE:12/21/2006                            DOB:          12/27/1930    [name] returns today for further management of his coronary artery  disease.   1. He is having no symptoms of angina or ischemia. Last stress Myoview      was December 17, 2004. EF was 61%. No ischemia.  2. Hypertension.  3. Obesity. He continues to gain weight. He is now over 300 at 304. He      was 296 last June.  4. Hyperlipidemia.   His biggest complaint is not being able to get enough medications to get  him through each month at [facility]. He would like me to write for 4 or 5  more pills per month so that he does not run out when he fails to get  there on time.   MEDICATIONS:  1. Lipitor 10 mg a day.  2. Aspirin 325 a day.  3. Metoprolol 100 b.i.d.   His blood pressure is 136/84, pulse is 88 and regular.   His EKG is stable.   Weight is 304.  HEENT: Normocephalic, atraumatic. Plethoric. He has just had a cancer  removed from the tip of his nose.  Extra-ocular movements intact.  Sclerae are clear. Facial symmetry is normal. Carotids are full. There  is no JVD.  Thyroid is not enlarged. Trachea is normal.  LUNGS:  Are clear.  HEART: Reveals a regular rate and rhythm without gallop. PMI is hard to  appreciate.  ABDOMEN: Protuberant with good bowel sounds. Organomegaly cannot be  assessed.  EXTREMITIES: Reveals no edema. Pulses are intact.  NEURO: Intact.   ASSESSMENT/PLAN:  [name] is stable. Will continue to treat him with  medical therapy. I have renewed his medications and added four or five  pills extra per month. Hopefully, [facility] will fill this. I will see him  back in a year.    Thomas C. Daleen Squibb, MD, [facility]  Electronically Signed   TCW/MedQ  DD: 12/21/2006  DT: 12/21/2006  Job #: [id number]   cc:   [name], M.D.

## 2010-12-01 NOTE — H&P (Signed)
[name], [name] NO.:  [id number]   MEDICAL RECORD NO.:  [id number]          PATIENT TYPE:  AMB   LOCATION:  DSC                          FACILITY:  [facility]   PHYSICIAN:  Hermelinda Medicus, M.D.   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  We have done this  before.  We have tried to remove all recurrent polyps, and we have done  resection of that medial maxillary sinus wall, lateral nasal wall, but I  still concerned about one area, and we plan to biopsy this.  He also has  on his left nasal tip a 1.2-cm lesion which is a basal cell tumor, and  we will remove this and do a post auricular full-thickness skin graft  correction.   His past surgeries have been sinus surgery on April 02, 2005 and  June 24, 2005 for his sinuses.   On his past history, also he has had a complete workup for his cardiac  status, having cardiac evaluation, and he has done really quite well.  His EKG showed sinus rhythm with first-degree AV block, and he had a  study done in 2006, and the study performed showed no interval change  from 2005.  This was a negative pharmacologic stress test.  A Myoview  study revealing significant stress-induced EKG abnormalities, normal  left ventricular size, normal overall left ventricular systolic  function.  By sonographic managing, there was a prominent physiologic  apical thinning but no convincing evidence of myocardial ischemia.  He  is going to have another test in the future but has done very well.  Has  had no problem with any chest pain  and has not used any medication for  this, nitroglycerin, in several months.  He did have a previous coronary  bypass in 1995.  His EKG was normal sinus rhythm, first-degree AV heart  block with a delayed R-wave progression, but still this is unchanged.  His further past history,  he is obese, has got a 40 body mass.   His medications are:  1. Diovan HCT 160/12.5 daily.  2. Metoprolol 100 mg daily.  3. Lipitor 10 daily.  4. He takes a motion sickness medication as necessary.  5. Advair as necessary.  6. Multiple vitamins.   His further past history is one where he has had benign prostatic  hypertrophy.  He has used CPAP.  He has had a hydrocele on the right  side, a right inguinal hernia and a TURP, and a right knee surgery.   He has no allergies to medications.   Does not smoke or drink.   His physical examination reveals a blood pressure 156/86.  His pulse is  65.  Ears are clear.  Tympanic membranes are clear.  His nose shows this 1.2-  cm lesion on the nasal tip with a raised area and also a crusting in the  center of this area; this is on the left tip.  His right internal  examination of his nose reveals some suspicious polyps at the inferior  turbinate region, even though this area was excised in the past, we can  see into his sinus on the scope but it is of concern.  His oral cavity  is clear.  NECK:  Is free of any thyromegaly, cervical adenopathy or mass.  Larynx  is clear to false cords. Epiglottis and base of tongue are clear to cord  mobility, gag reflex, __________ facial nerve are all symmetrical.  The chest is clear.  No rales, rhonchi or wheezes.  CARDIOVASCULAR:  No __________ murmurs or gallops.  ABDOMEN:  Unremarkable.  EXTREMITIES:  Unremarkable.  He is obese.   DIAGNOSIS:  1. History of inverted papilloma, right inferior nasal vestibule,      lateral nasal wall, with resection and reresection and checking  2. 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  3. History of obesity, weighing 300 pounds.  4. History of myocardial infarction.  5. History of open-heart surgery in 1995.  6. History of blood pressure elevation.           ______________________________  Hermelinda Medicus, M.D.     JC/MEDQ  D:  12/06/2006  T:  12/06/2006  Job:  [id number]   cc:   Thomas C. Daleen Squibb, MD, [facility]  Marjory Lies, M.D.

## 2010-12-01 NOTE — Assessment & Plan Note (Signed)
[facility]                            CARDIOLOGY OFFICE NOTE   NAME:[name]                     MRN:          [id number]  DATE:12/18/2007                            DOB:          [birth year]    [name] returns today for further management of the following issues.  1. Coronary disease.  He is having no angina.  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  He had an EF of 65% with no ischemia or      scar.  2. Hypertension.  3. Obesity.  His weight is stable today at 304, but he has gained a      bunch in the past.  4. Hyperlipidemia.  He has not had any blood work done at [facility] in over a year.  5. Type 2 diabetes.  He says he does not check his blood sugar!   MEDICATIONS:  1. Include metoprolol 100 mg p.o. b.i.d.  2. Bonine.  3. Aspirin 325 mg a day.  4. Diovan hydrochlorothiazide 160/12.5 one-and-a-half tabs q. a.m.  5. Lipitor 10 mg a day.  6. Glucophage XR 1000 mg 2 a day.  7. Avandia 4 mg a day.  8. Benicar 20 mg a day.  9. Lisinopril 20 mg a day.  10.Pepcid 2 a day.   EXAM:  Blood pressure is 142/94, which is pretty good for him.  Pulses  62 and regular.  HEENT:  Unchanged.  Carotids are full.  No bruits.  Thyroid is not enlarged.  Trachea is  midline.  Neck is supple.  LUNGS:  Clear.  HEART:  Reveals a poorly appreciated PMI.  Normal S1-S2.  ABDOMEN:  Exam is protuberant, good bowel sounds.  No obvious midline  bruit.  No obvious organomegaly.  Though it is difficult to assess.  EXTREMITIES:  Reveal trace edema.  Pulses are brisk 2+/4+ bilaterally  symmetrical.  NEURO:  Exam is intact.  SKIN:  Unremarkable.   I have strongly urged [name] to follow through with Dr. Caryl Never at  [facility] for blood work.  I have also advised him to  get serious about his blood sugar.  I will plan on seeing him back  again  in a year.     Thomas C. Daleen Squibb, MD, [facility]  Electronically Signed    TCW/MedQ  DD: 12/18/2007  DT: 12/18/2007  Job #: [id number]   cc:   [name], M.D.

## 2010-12-01 NOTE — Op Note (Signed)
[name], [name]              ACCOUNT NO.:  [id number]   MEDICAL RECORD NO.:  [id number]          PATIENT TYPE:  AMB   LOCATION:  DSC                          FACILITY:  [facility]   PHYSICIAN:  Hermelinda Medicus, M.D.   DATE OF BIRTH:  09-Jun-1931   DATE OF PROCEDURE:  12/06/2006  DATE OF DISCHARGE:                               OPERATIVE REPORT   PREOPERATIVE DIAGNOSIS:  Left nasal tip basal cell tumor 1.2 cm and  right inferior turbinate region lateral nasal wall possible inverted  papilloma recurrence within the sinus.   POSTOPERATIVE DIAGNOSIS:  Left nasal tip basal cell tumor 1.2 cm and  right inferior turbinate region lateral nasal wall possible inverted  papilloma recurrence within the sinus.   OPERATION:  Excisional biopsy of the left nasal tip with postauricular  full-thickness skin graft, 1.2 cm, with a right biopsy excision of what  appears to be inverted papilloma polyps, inferior lateral nose.   OPERATOR:  Hermelinda Medicus, M.D.   ANESTHESIA:  Local MAC with Dr. Gelene Mink.   PROCEDURE:  The was placed in the supine position.  Under local MAC  anesthesia, the nose was anesthetized first, injecting the infraorbital  nerves as well as the ethmoid region neurology, and then we injected  locally and did a circumferential excision of this lesion after using  the marking pen.  We marked this superior aspect with a stitch.  Once  all hemostasis was established, we then used a new knife, anesthesia,  all new instruments, and removed from the postauricular region and a  small full-thickness skin graft and closed this with 5-0 Ethilon after  all hemostasis was established.  We then came back to the nose, took  this circular skin graft, and sutured this in place using 5-0 Ethilon  interrupted stitches.  Our attention was then carried to the nose where,  using the endoscope 0-degree, we looked into the nose and evaluated the  mucous membrane and the suspicious polyps were biopsy  excised under  local anesthesia 1% Xylocaine plain.  We placed a Telfa pack within his  nose on the right side.  Once this was completed, Steri-Strips were  applied to his nasal tip and the patient was awakened, tolerated the  procedure well, and is doing well postoperatively.  He was treated  during the procedure for some blood pressure elevation with labetalol.  The patient is doing well, however.  He will be followed tomorrow, then  in one week, then in two weeks, and will return to see me in four weeks  and then every three months after that.           ______________________________  Hermelinda Medicus, M.D.     JC/MEDQ  D:  12/06/2006  T:  12/06/2006  Job:  [id number]   cc:   Maisie Fus C. Daleen Squibb, MD, [facility]  Marjory Lies, M.D.

## 2010-12-01 NOTE — Op Note (Signed)
[name], SCHANZ              ACCOUNT NO.:  [id number]   MEDICAL RECORD NO.:  [id number]          PATIENT TYPE:  AMB   LOCATION:  SDS                          FACILITY:  [facility]   PHYSICIAN:  Hermelinda Medicus, M.D.   DATE OF BIRTH:  May 29, 1931   DATE OF PROCEDURE:  06/21/2007  DATE OF DISCHARGE:                               OPERATIVE REPORT   PREOPERATIVE DIAGNOSIS:  Multiple lesions on the right back, probable  basal cell, left back, right brow, possible melanoma, right eyelid  lateral upper right temporal, right post auricular, right superior  clavicular, left back and left posterior auricular.   POSTOPERATIVE DIAGNOSIS:  Frozen section basal cell margins clear, right  brow frozen section negative for melanoma.   OPERATOR:  Hermelinda Medicus, M.D.   OPERATION:  Resection of the above-mentioned lesions.   ANESTHESIA:  Local MAC with Dr. Michelle Piper.   PROCEDURE:  The patient was placed in the supine position under local  MAC anesthesia.  First, the patient's back was approached, and on the  right side, we did this lesion which had a satellite lesion.  Also both  were somewhat close together, but both were basal cell tumors.  The A  lesion had margins that were positive.  We had to redo the margins which  we did and were now negative and the B section of #1 had negative  margins.  These were closed with chromic catgut 2-0 and 5-0 Ethilon and  skin clips.  The left back was excised and closed with 5-0 Ethilon.  No  frozen section was done.  The patient was then repositioned, and the  right side was first approached, especially the temporal lesion that  appeared to have lobulated black appearance and was concern of a  melanoma, but this was sent back and on frozen section is not melanoma,  and margins were clear.  This was closed was 5-0 Ethilon, and Steri-  Strips were applied.  The lateral upper eyelid was then approached, and  this was removed and closed with 5-0 Ethilon and  Steri-Strips.  The  temporal region was also approached and excised and closed with 5-0  Ethilon.  The post auricular lesion was removed, and this was closed  with Ethilon, and Steri-Strips were applied to all these lesions.  The  supraclavicular lesion was then excised and closed with Ethilon, and  Steri-Strips were applied.  On the left side, we did the post auricular  lesion which was closed with chromic and 5-0 Ethilon and skin clips, and  then the back was again checked, and all lesions were then closed.  The  patient tolerated the procedure very well.  The  patient was prepped before this procedure was done in the several areas,  and the patient was obviously repositioned and redraped each time.  The  patient tolerated the procedure very well and is doing well  postoperatively.  His follow-up will be on Friday in 2 days and then in  1 week, 3 weeks, 6 weeks and 3 months.           ______________________________  Hermelinda Medicus,  M.D.     JC/MEDQ  D:  06/21/2007  T:  06/21/2007  Job:  [id number]   cc:   Thomas C. Daleen Squibb, MD, [facility]  [name], M.D.

## 2010-12-04 ENCOUNTER — Other Ambulatory Visit (HOSPITAL_COMMUNITY): Payer: Self-pay | Admitting: Otolaryngology

## 2010-12-04 ENCOUNTER — Encounter (HOSPITAL_COMMUNITY)
Admission: RE | Admit: 2010-12-04 | Discharge: 2010-12-04 | Disposition: A | Discharge status: Home / Self Care | Payer: Medicare Other | Source: Ambulatory Visit | Attending: Otolaryngology | Admitting: Otolaryngology

## 2010-12-04 ENCOUNTER — Ambulatory Visit (HOSPITAL_COMMUNITY)
Admission: RE | Admit: 2010-12-04 | Discharge: 2010-12-04 | Disposition: A | Discharge status: Home / Self Care | Payer: Medicare Other | Source: Ambulatory Visit | Attending: Otolaryngology | Admitting: Otolaryngology

## 2010-12-04 DIAGNOSIS — Z01810 Encounter for preprocedural cardiovascular examination: Secondary | ICD-10-CM | POA: Insufficient documentation

## 2010-12-04 DIAGNOSIS — I517 Cardiomegaly: Secondary | ICD-10-CM | POA: Insufficient documentation

## 2010-12-04 DIAGNOSIS — Z01811 Encounter for preprocedural respiratory examination: Secondary | ICD-10-CM

## 2010-12-04 DIAGNOSIS — Z01812 Encounter for preprocedural laboratory examination: Secondary | ICD-10-CM | POA: Insufficient documentation

## 2010-12-04 DIAGNOSIS — Z01818 Encounter for other preprocedural examination: Secondary | ICD-10-CM | POA: Insufficient documentation

## 2010-12-04 LAB — URINALYSIS, ROUTINE W REFLEX MICROSCOPIC
Bilirubin Urine: NEGATIVE
Glucose, UA: NEGATIVE mg/dL
Hgb urine dipstick: NEGATIVE
Ketones, ur: NEGATIVE mg/dL
Leukocytes, UA: NEGATIVE
Nitrite: NEGATIVE
Protein, ur: 30 mg/dL — AB
Specific Gravity, Urine: 1.013 (ref 1.005–1.030)
Urobilinogen, UA: 1 mg/dL (ref 0.0–1.0)
pH: 7.5 (ref 5.0–8.0)

## 2010-12-04 LAB — CBC
HCT: 46.2 % (ref 39.0–52.0)
Hemoglobin: 15.6 g/dL (ref 13.0–17.0)
MCH: 32.2 pg (ref 26.0–34.0)
MCHC: 33.8 g/dL (ref 30.0–36.0)
MCV: 95.3 fL (ref 78.0–100.0)
Platelets: 165 10*3/uL (ref 150–400)
RBC: 4.85 MIL/uL (ref 4.22–5.81)
RDW: 12.9 % (ref 11.5–15.5)
WBC: 7.9 K/uL (ref 4.0–10.5)

## 2010-12-04 LAB — URINE MICROSCOPIC-ADD ON

## 2010-12-04 LAB — BASIC METABOLIC PANEL WITH GFR
Chloride: 97 meq/L (ref 96–112)
Creatinine, Ser: 1.17 mg/dL (ref 0.4–1.5)
Glucose, Bld: 130 mg/dL — ABNORMAL HIGH (ref 70–99)
Potassium: 4.2 meq/L (ref 3.5–5.1)
Sodium: 140 meq/L (ref 135–145)

## 2010-12-04 LAB — BASIC METABOLIC PANEL
BUN: 14 mg/dL (ref 6–23)
CO2: 36 mEq/L — ABNORMAL HIGH (ref 19–32)
Calcium: 9.2 mg/dL (ref 8.4–10.5)
GFR calc Af Amer: >60 mL/min (ref >60)
GFR calc non Af Amer: >60 mL/min (ref >60)

## 2010-12-04 LAB — SURGICAL PCR SCREEN
MRSA, PCR: NEGATIVE
Staphylococcus aureus: NEGATIVE

## 2010-12-04 NOTE — Op Note (Signed)
[name], [name]              ACCOUNT NO.:  [id number]   MEDICAL RECORD NO.:  [id number]          PATIENT TYPE:  AMB   LOCATION:  SDS                          FACILITY:  [facility]   PHYSICIAN:  Hermelinda Medicus, M.D.   DATE OF BIRTH:  03-Aug-1930   DATE OF PROCEDURE:  04/02/2005  DATE OF DISCHARGE:                                 OPERATIVE REPORT   PREOPERATIVE DIAGNOSIS:  Right pansinusitis with nasal obstruction secondary  to multiple polyps with rule-out inverted papilloma.   POSTOPERATIVE DIAGNOSIS:  Right pansinusitis with nasal obstruction  secondary to multiple polyps with rule-out inverted papilloma with frozen  section diagnosis of inverted papilloma.   OPERATION:  Functional endoscopic sinus surgery with right frontal  ethmoidectomy with a right nasal polypectomy and posterior nasal polypectomy  with sphenoidotomy with a maxillary sinus ostial enlargement and antrostomy  and removal of polyps with aggressive medial resection of maxillary sinus  with a frozen section diagnosis of inverted papilloma.   OPERATORY:  Hermelinda Medicus, MD   ANESTHESIA:  Local MAC with Dr. Gypsy Balsam and 1% Xylocaine with epinephrine, 8  mL, and topical cocaine 200 mg.   PROCEDURE:  The patient was placed in the supine position and under local  anesthesia, was prepped and draped in the appropriate manner using the usual  Hibiclens and the usual head drape.  Anesthesia was instilled on the polyps  and on the normal tissues.  Anterior nasal polyps were removed and as we  began to view further polyps posteriorly, we approached the ethmoid sinus  first  The middle turbinate had been eroded by the polyps and this  essentially was removed with the polypoid debris up into the ethmoid sinus.  We suctioned the sinus carefully and found that the sinus itself was intact.  The lamina papyracea and superior ceiling of  the sinus were intact and once  this was cleared and the frontal sinus was more clear, we placed  a Gelfoam  in that area for hemostasis.  We then extended our way back posteriorly and  removed many more huge polyps, some measuring 2 x 3, one 3 x 4 cm in size,  that were occupying the nasopharynx and the posterior nose.  We sent also an  area polypoid debris from the natural ostium of the maxillary and ethmoid  sinuses for frozen section and did find there was evidence of inverted  papilloma.  We extended our resection back to our sphenoidotomy and then  separated out tissues, worked back on the maxillary sinus of the natural  ostium and antrostomy and removed a very large polyp out of the right  maxillary sinus.  We then went back and using the 0-degree scope and the 70-  degree scopes, we evaluated the ethmoid, posterior nasopharynx, sphenoid  area and maxillary area, and were aggressively removed a portion of the  medial maxillary wall and ethmoid area where the most likely source of the  inverted papilloma would be.  Once this was achieved, we suctioned all the  sinuses, placed further Gelfoam in that ethmoid sinus and placed a Merocel  pack.  Our blood loss was estimated at 150 mL and the patient tolerated the  procedure very well and is doing well postoperatively.  He never had a  cardiac problem during the procedure.  The patient is aware of the risks and  gains here and also aware that he will be kept overnight because of his  medical status and his age and also will be watched carefully with  packing in place.  He is not to do any heavy lifting and to be very  conservative in his activities over the weekend.  We will see him again in 3  days and then in 1 week and then 2 weeks, 4 weeks, 6 weeks, 3 months, 6  months, and a year.           ______________________________  Hermelinda Medicus, M.D.     JC/MEDQ  D:  04/02/2005  T:  04/02/2005  Job:  [id number]   cc:   Marjory Lies, M.D.  [address]  [place]  [place] [address]  Fax: [phone]   [name], M.D.  [address]  [place]  [place] [address]  Fax: [phone]   [name], M.D.  [address]  [address]  [place]  [place] [address]

## 2010-12-04 NOTE — Op Note (Signed)
[name], [name]                        ACCOUNT NO.:  [id number]   MEDICAL RECORD NO.:  [id number]                   PATIENT TYPE:  AMB   LOCATION:  DSC                                  FACILITY:  [facility]   PHYSICIAN:  Sandria Bales. Ezzard Standing, M.D.               DATE OF BIRTH:  02-27-1931   DATE OF PROCEDURE:  03/12/2004  DATE OF DISCHARGE:                                 OPERATIVE REPORT   PREOPERATIVE DIAGNOSIS:  Right inguinal hernia with scrotal mass, possible  hydrocele.   POSTOPERATIVE DIAGNOSIS:  Direct right inguinal hernia with scrotal  hydrocele, noncommunicating.   PROCEDURE:  Right inguinal herniorrhaphy with mesh, marsupialization of the  right scrotal hydrocele.   SURGEON:  Sandria Bales. Ezzard Standing, M.D.   ANESTHESIA:  General endotracheal with 30 mL of 0.25% Marcaine.   COMPLICATIONS:  None.   INDICATIONS FOR PROCEDURE:  [name] is a 77 year old white male who  comes with evidence of a right inguinal hernia and scrotal mass on physical  exam.  Because of his obesity, it is hard to tell whether the scrotal mass  is a hydrocele versus part of a large hernia component.  The indications and  potential complications were explained to the patient.  The potential  complications include but are not limited to risks of bleeding, infection,  nerve injury, recurrence of either the hernia or the hydrocele if one is  found.   DESCRIPTION OF PROCEDURE:  The patient placed in the supine position and  given a general anesthetic and placed in a mild lithotomy position.  He was  given 1 gram Ancef at the beginning of the procedure.  His right groin was  shaved, prepped with Betadine solution, and sterilely draped.  An inguinal  incision was made with sharp dissection carried down to the external oblique  fascia.  The external oblique fascia was opened and the cord structures were  encircled with a Penrose drain.  I looked for an indirect inguinal hernia  that communicated with this  scrotal mass and I really could not find one and  really, what he has, is a noncommunicating hydrocele of the right scrotum.  He does have a small to moderate direct inguinal hernia.  Again, this did  not communicate with the hydrocele so he only actually had two separate  components going on.  I found no evidence of any indirect hernia.  I  explored the internal ring.  His inguinal floor was weak with protrusion of  about 3-4 cm consistent with a direct inguinal hernia and I carried out an  inguinal floor repair.  The repair was using a Marlex mesh cut in the shape  of the inguinal floor.  This was a 3 by 6 mesh cut down to about 2 1/2 by 5  inches.  A keyhole was cut of the internal ring.  The mesh was sewn medially  to the pubic tubercle, inferiorly  to the shelving edge of the inguinal  ligament, superiorly to the transversalis fascia and the keyhole was closed  behind the cord structures recreating an internal ring.  All the sutures  used were 0 Novafil suture.  I then pulled the testicle up to get to this  scrotal mass.  The mass, again, turned out to be a hydrocele which was  probably about 6-7 cm in diameter.  I was able to get into the hydrocele,  drain out the fluid, and then I cut a large enough hole to try to  marsupialize so if any fluid reaccumulated, it would drain out of the  hydrocele.  There was no bleeding at the edges.  I then irrigated the wound  with saline and returned the cord structures to their normal location.  We  closed the external oblique fascia with interrupted 3-0 Vicryl sutures,  closed the subcutaneous tissues with 3-0 Vicryl sutures, closed the skin  with 5-0  Monocryl suture, painted the wound with tincture of Benzoin and placed Steri-  Strips.  The patient tolerated the procedure well and was transported to the  recovery room in good condition.  Sponge and needle counts were correct at  the end of the case.                                                Sandria Bales. Ezzard Standing, M.D.    DHN/MEDQ  D:  03/12/2004  T:  03/12/2004  Job:  [id number]   cc:   Marjory Lies, M.D.  [address]  [place]  [place] [address]  Fax: [phone]   [name], M.D.

## 2010-12-04 NOTE — Op Note (Signed)
[name], [name]              ACCOUNT NO.:  [id number]   MEDICAL RECORD NO.:  [id number]          PATIENT TYPE:  OIB   LOCATION:  2550                         FACILITY:  [facility]   PHYSICIAN:  Hermelinda Medicus, M.D.   DATE OF BIRTH:  Jul 07, 1931   DATE OF PROCEDURE:  06/24/2005  DATE OF DISCHARGE:                                 OPERATIVE REPORT   PREOPERATIVE DIAGNOSIS:  Right maxillary ethmoid and sphenoid sinusitis with  history of inverted papilloma with recurrent inverted papilloma in the  maxillary region.   POSTOPERATIVE DIAGNOSIS:  Right maxillary ethmoid and sphenoid sinusitis  with history of inverted papilloma with recurrent inverted papilloma in the  maxillary region.   OPERATION:  Functional endoscopic sinus surgery with a excisional biopsy of  the right sphenoid, the anterior ethmoid, the resection of a portion of the  middle turbinate, resection of the inferior turbinate and medial maxillary  sinus wall with removal of inverted papilloma, margins clear and also within  the ethmoid and maxillary sinus.   SURGEON:  Hermelinda Medicus, M.D.   ANESTHESIA:  General endotracheal with Dr. Katrinka Blazing with associated 1%  Xylocaine with epinephrine, topical cocaine 200 mg.   PROCEDURE:  Patient placed in supine position under general endotracheal  anesthesia, the patient was prepped and draped in the appropriate manner  using the usual head drape and the margins were first checked anteriorly  using the Bovie electrocoagulation. We marked our incisional site.  The area  of bleeding was found and resolved.  We used the suction Bovie and then  working inferiorly the inverted papilloma was involving the inferior aspect  middle turbinate and the inferior turbinate. The anterior sphenoid region  was also involved and then the anterior ethmoid region. This was separately  excisional biopsied where we had a sphenoid, we had turbinate and ethmoid  biopsy excisions and then we resected the  medial wall of the maxillary sinus  and also the contents of the maxillary sinus where a combination of inverted  papilloma polyps and normal appearing polyps were removed using the 0 and 70  degree scope and the Blakesley Wilder forceps.  Once these were cleared, all  hemostasis was completely established using the Bovie electrocoagulation and  suction and then placed Gelfoam within the maxillary sinus and also then  placed Merocel within the nose. The patient is doing very well and will be  kept overnight for observation.           ______________________________  Hermelinda Medicus, M.D.     JC/MEDQ  D:  06/24/2005  T:  06/24/2005  Job:  [id number]   cc:   Thomas C. Wall, M.D.  [address]  [address]  [place]  [place] [address]   Marjory Lies, M.D.  Fax: [phone]

## 2010-12-04 NOTE — H&P (Signed)
[name], [name]              ACCOUNT NO.:  [id number]   MEDICAL RECORD NO.:  [id number]          PATIENT TYPE:  AMB   LOCATION:  SDS                          FACILITY:  [facility]   PHYSICIAN:  Hermelinda Medicus, M.D.   DATE OF BIRTH:  1931-05-16   DATE OF ADMISSION:  06/24/2005  DATE OF DISCHARGE:                                HISTORY & PHYSICAL   This patient is a 77 year old male who has had nasal obstruction and nasal  polyposis on the right side for several months.  He has had his nose blocked  up on the right side and has difficulty breathing for approximately two  years.  He has not been a smoker and he has not had a history of drinking  alcohol but he did work on [employer] and worked in the past 30 years on  considerable inspiration of chemicals in a harsh environment.  He began to  also have some bleeding problems in his right nose and CAT scan was done  which showed a right pan sinusitis.  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  We felt we were  extensively removing this but then he has had recurrent polyp on that right  side and a CAT scan shows his maxillary sinus showing further development of  polypoid change.  He also had some bleeding in the past week where I placed  a right Merocel pack.  He now returns for reevaluation and resection of the  medial wall of the right maxillary sinus with possible ethmoidectomy,  turbinectomy, and sphenoidotomy under general endotracheal anesthesia.  He  has had a CABG done in 1995, three vessel, and has been fully evaluated by  Dr. Daleen Squibb with a stress test in June and was cleared by Dr. Juanito Doom for  surgery.  He has also had prostate surgery in the past in 1998, had a  scrotal hydrocele in 2005 and hernia repair in 2006 March.  He has also had  a hemorrhoid problem in the past.  He has been exposed to considerable  noise  and has some sensory neural hearing loss.   MEDICATIONS:  1.  Lipitor 10.  2.  Diovan/hydrochlorothiazide 160/12.5 one in the a.m. and one-half in the      p.m.  3.  Metoprolol 100 mg p.o. daily.  4.  Multivitamins.  5.  __________ 50 mg.  6.  Aspirin 81 where he has been off this for at least five days.   He has no allergies to medications.  Apparently CREAM CHEESE is a bother to  him.   PHYSICAL EXAMINATION:  GENERAL:  He is a well-nourished, well-developed male  in no acute distress.  His packing is still in place as he did have this  recent bleeding.  VITAL SIGNS:  Blood pressure 144/94, height 68 inches, weight 285, heart  rate 74.  HEENT:  His left nose is in good condition.  His right nose shows the  previous surgery and polypoid change that  has recurred.  His CAT scan has  shown similarly and is in the chart.  His oral cavity is clear.  His neck is  free of any thyromegaly, cervical adenopathy, or mass.  His larynx is clear.  True cords, false cords, epiglottis, base of tongue are clear.  True cord  mobility, gag reflex, tongue mobility, EOMs, facial nerve are all  symmetrical.  ABDOMEN:  Mildly obese.  No liver, spleen, or kidney palpable.  EXTREMITIES:  Unremarkable.  Previous surgeries noted.   INITIAL DIAGNOSES:  Right inverted papilloma involving maxillary, ethmoid,  and sphenoid sinus with apparent recurrence with some epistaxis in the right  maxillary sinus.  All sinuses on the right side will be checked.  His past  history is that of a CABG in 1995, good stress test done recently.           ______________________________  Hermelinda Medicus, M.D.     JC/MEDQ  D:  06/24/2005  T:  06/24/2005  Job:  [id number]   cc:   Marjory Lies, M.D.  Fax: [phone]   [name], M.D.  [address]  [address]  [place]  [place] [address]

## 2010-12-04 NOTE — H&P (Signed)
[name], [name]              ACCOUNT NO.:  [id number]   MEDICAL RECORD NO.:  [id number]          PATIENT TYPE:  AMB   LOCATION:  SDS                          FACILITY:  [facility]   PHYSICIAN:  Hermelinda Medicus, M.D.   DATE OF BIRTH:  September 24, 1930   DATE OF ADMISSION:  04/02/2005  DATE OF DISCHARGE:                                HISTORY & PHYSICAL   This patient is a 77 year old male who has had nasal obstruction and nasal  polyposis on the right side for some several months. He stated he has had  some blockage of his nose for approximately 2 years, has not been a smoker,  has also not drunk any alcohol. He does work on [employer], or has  worked in the past, for 30 years, and does have a history of considerable  inspiration of some chemicals and harsh environment. He began to have some  bleeding problems from his nose on the right side and this lead him to [place],  and he had already been treated with steroid nasal sprays, decongestants,  and antibiotics, and a CAT scan of his sinuses showed complete opacification  of the right paranasal sinuses of the right nasal vault going back. We could  see the polyps anteriorly and these extended back into the nasopharynx. This  caused obliteration of the nasal airway on the right side, even causing some  bony remodeling of the septum. He also has total obliteration or obstruction  of the maxillary, sphenoid, frontal, and ethmoid region. He now enters for  evaluation and removal of these polyps and functional endoscopic sinus  surgery where we would enter each sinus and remove polyps from the sinus,  and evaluate and treat for possible inverted papilloma.   PAST HISTORY:  Is that of a CABG done in 1995, three vessel, and he has been  fully evaluated, has had a recent stress test in June, and has been cleared  by Dr. Juanito Sanchez for surgery. He also had prostate surgery in 1998. He had a  scrotal hydrocele in December 2005, a hernia repair in March  2006, and he  has had a hemorrhoid problem in the past. He has some history of some  hearing loss in both ears, has been exposed to considerable noise.   MEDICATIONS:  1.  Metoprolol 100 mg daily.  2.  Lipitor 10 daily.  3.  __________ antiemetic one in the a.m., one in the p.m.  4.  Diovan/hydrochlorothiazide 160 in the a.m. and one-half a tablet in the      p.m.   He has taken Ecotrin and Nasonex in the past; those have been held.   He has no allergies to medications except for CREAM CHEESE. He drinks no  alcohol and has not smoked.   PHYSICAL EXAMINATION:  VITAL SIGNS:  Reveals a blood pressure of 148/83, his  heart rate is 60, and he weighs 184 with a height of 68 inches.  HEENT:  His ears are clear, tympanic membranes are clear. Nose shows total  obstruction of the right nasal vault and the nasopharynx. The neck is free  of any thyromegaly, cervical adenopathy, or mass. His larynx is clear. True  cords, false cords, epiglottis, base of tongue are clear. True cord  mobility, gag reflex, tongue mobility, EOMs, facial nerve are also clear and  symmetrical.  CHEST:  Clear. No rales, rhonchi, or wheezes.  CARDIOVASCULAR:  No opening snaps, murmurs, or gallops. He does have an  increased A/P diameter.   His EKG recently was read out as normal. Past EKG has been read out as  showing old past infarct, possible anterior, age undetermined.   INITIAL DIAGNOSIS:  Right nasal polyposis with right frontoethmoid,  maxillary, and sphenoid sinusitis, rule out inverted papilloma.   POSTOPERATIVE DIAGNOSIS:  Right nasal polyposis with right frontoethmoid,  maxillary, and sphenoid sinusitis, rule out inverted papilloma.   OPERATION:  Functional endoscopic sinus surgery with a right  frontoethmoidectomy, a right maxillary sinus ostial enlargement, and  possible medial Sanchez resection with a sphenoidotomy and nasal polypectomy.   His further diagnoses are those of the prostate surgery history  in 1998,  scrotal hydrocele in 2005, hernia repair in 2006, and hemorrhoidectomy.           ______________________________  Hermelinda Medicus, M.D.     JC/MEDQ  D:  04/02/2005  T:  04/02/2005  Job:  [id number]   cc:   [name], M.D.  [address]  [place]  [place] [address]  Fax: [phone]   [name], M.D.  [address]  [address]  [place]  [place] [address]

## 2010-12-04 NOTE — Op Note (Signed)
[name], [place]              ACCOUNT NO.:  [id number]   MEDICAL RECORD NO.:  [id number]          PATIENT TYPE:  AMB   LOCATION:  DSC                          FACILITY:  [facility]   PHYSICIAN:  Sandria Bales. Ezzard Standing, M.D.  DATE OF BIRTH:  1930/09/05   DATE OF PROCEDURE:  06/25/2004  DATE OF DISCHARGE:                                 OPERATIVE REPORT   PREOPERATIVE DIAGNOSIS:  Right scrotal hydrocele.   POSTOPERATIVE DIAGNOSIS:  Right scrotal hydrocele.   OPERATION PERFORMED:  Right hydrocele resection with marsupialization of  sac.   SURGEON:  Sandria Bales. Ezzard Standing, M.D.   ASSISTANT:  None.   ANESTHESIA:  General LMA with about 10 mL of 0.25% Marcaine.   INDICATIONS FOR PROCEDURE:  Ms. Rase is a 77 year old white male who had  a right inguinal hernia repaired in August of 2005.  He has done well from  the right inguinal hernia repair but he has had a hydrocele of the right  scrotum which has been persistent despite the hernia repair.  He now comes  for resection of this hydrocele.  I have discussed with him the indications,  potential complications.  The potential complications include but are not  limited to bleeding, infection and recurrence of the hydrocele.   DESCRIPTION OF PROCEDURE:  The patient was placed in supine position and  given a general LMA anesthesia.  His scrotum and penis were prepped with  Betadine solution.  I went through the lateral wall of the scrotum through  about a 3 to 4 cm incision, cut down to the hydrocele sac.  I then opened  the hydrocele, aspirated about 200 mL of fluid out.  I then resected about a  third to half the wall of the hydrocele but sparing the attachments to the  structures to the testicle and the testicle itself.  I then oversewed the  edge of the hydrocele with a running locking 2-0 chromic suture.   I then irrigated the wound.  I infiltrated the hydrocele sac, the  surrounding edges with about 10 mL of 0.25% Marcaine.  I closed the  muscle  layer of the scrotum with interrupted 3-0 chromic suture or 2-0 chromic  suture.  I closed the skin with interrupted 2-0 chromic suture.  The patient  tolerated the procedure well and will be discharged to home today to wear a  scrotal support and given Vicodin for pain.  Return to see me in two weeks  for follow-up.      Davi   DHN/MEDQ  D:  06/25/2004  T:  06/26/2004  Job:  [id number]   cc:   Marjory Lies, M.D.  [address]  [place]  [place] [address]  Fax: [phone]

## 2010-12-07 ENCOUNTER — Telehealth: Payer: Self-pay | Admitting: Critical Care Medicine

## 2010-12-07 NOTE — Telephone Encounter (Signed)
In general there are no pulmonary contra-indications for local anesthesia. OK to proceed. He does need oxygen continuously, have them monitor o2 satn & keep this above 88% during procedure. Will forward to PW as FYI. You can fax this note to Dr Haroldine Laws

## 2010-12-07 NOTE — Telephone Encounter (Signed)
Spoke with Steward Drone at Dr. Allayne Stack office. She states that Dr. Haroldine Laws mailed Dr. Delford Field a letter approx 1 wk ago asking for clearance for surgery to remove basal cell ca from pt's ear. She states that this is sched tomorrow am at 10 and he is to be under local anesthesia. I advised PW out of the office this wk and will have to forward to doc on call to handle this.

## 2010-12-07 NOTE — Telephone Encounter (Signed)
Steward Drone informed of RA's recs

## 2010-12-08 ENCOUNTER — Other Ambulatory Visit: Payer: Self-pay | Admitting: Otolaryngology

## 2010-12-08 ENCOUNTER — Ambulatory Visit (HOSPITAL_COMMUNITY)
Admission: RE | Admit: 2010-12-08 | Discharge: 2010-12-08 | Disposition: A | Discharge status: Home / Self Care | Payer: Medicare Other | Source: Ambulatory Visit | Attending: Otolaryngology | Admitting: Otolaryngology

## 2010-12-08 DIAGNOSIS — L821 Other seborrheic keratosis: Secondary | ICD-10-CM | POA: Insufficient documentation

## 2010-12-08 DIAGNOSIS — J449 Chronic obstructive pulmonary disease, unspecified: Secondary | ICD-10-CM | POA: Insufficient documentation

## 2010-12-08 DIAGNOSIS — J4489 Other specified chronic obstructive pulmonary disease: Secondary | ICD-10-CM | POA: Insufficient documentation

## 2010-12-08 DIAGNOSIS — I10 Essential (primary) hypertension: Secondary | ICD-10-CM | POA: Insufficient documentation

## 2010-12-08 DIAGNOSIS — E669 Obesity, unspecified: Secondary | ICD-10-CM | POA: Insufficient documentation

## 2010-12-08 DIAGNOSIS — C4441 Basal cell carcinoma of skin of scalp and neck: Secondary | ICD-10-CM | POA: Insufficient documentation

## 2010-12-08 DIAGNOSIS — C44211 Basal cell carcinoma of skin of unspecified ear and external auricular canal: Secondary | ICD-10-CM | POA: Insufficient documentation

## 2010-12-08 NOTE — Telephone Encounter (Signed)
i agree. 

## 2010-12-16 NOTE — H&P (Signed)
NAMEDEVONN, [name]              ACCOUNT NO.:  [id number]  MEDICAL RECORD NO.:  [id number]           PATIENT TYPE:  O  LOCATION:  SDSC                         FACILITY:  [facility]  PHYSICIAN:  Hermelinda Medicus, M.D.   DATE OF BIRTH:  03-12-1931  DATE OF ADMISSION:  12/08/2010 DATE OF DISCHARGE:  12/08/2010                             HISTORY & PHYSICAL   This is a 77 year old man who comes in [place] to a considerable amount of sun all his life, but he spent cut through years helping his brother fish, recently did not use any sunscreen and has a lesion, does not heal on the right ear pinna representing approximately 2-2.5 cm in size.  He also has two lesions on his left ear and neck measuring 1.5 cm in size, and now here considering his cardiovascular history and his pulmonary history where he is on oxygen at night, we are going to do him as a 23-hour observation at [facility].  I am sure these are basal or squamous cell carcinomas most likely basal cell.  His past history is one of having blackout spells and felt to have an anterior and an inferior myocardial infarction of undetermined age.  He also goes back, has history of heart catheterization, myocardial perfusion study, and a CABG.  He has had recent nuclear studies, which were normal.  He was seen by Dr. Daleen Squibb in 2011 for the syncopal spells and had his medication changed, and he is doing much better.  He was noted to have some difficulty sleeping flat. He had a Myoview, which was negative for ischemia.  He had some tingling in his left arm at times, but his cardiovascular situation at this time is really quite stable.  He was also evaluated for sleep apnea and found he did not need a breathing machine or any CPAP, but he does use oxygen at times when he is trying to sleep.  His medications are: 1. Diovan HCT 160/12.5. 2. Metoprolol tartrate 100. 3. Lipitor 10. 4. Aspirin 325. 5.  Multivitamins. 6. Bonine 25. 7. Nitrostat 0.4, which he uses rarely.  He has no other drug allergies except CORTISONE, causes a rash.  PAST HISTORY:  He has had a CABG in 1995.  He has had basal cells, had been taken care of under my care on his ears as well, functional endoscopic sinus surgery in 2006.  He had a hydrocele removed by Dr. Ovidio Kin in 2005, inguinal herniorrhaphy also in 2005, history of prostate surgery in 1998.  Never smoked, drank, or used any drugs.  PHYSICAL EXAMINATION:  VITAL SIGNS:  His weight is 306.  His blood pressure right now is 118/56, but many times it is higher at 156/95, it is somewhat of a variable range. HEENT:  His right ear shows a large 2.5-cm lesion on the pinna, which is not healing, is bleeding, and I am sure it is a basal cell tumor.  On the left ear, he has a 1-cm to 1.5-cm lesion on the pinna as well as a lesion on his left neck just below the  parotid.  His oral cavity is completely clear.  His larynx is clear.  True cords, false cords, epiglottis, base of tongue, lateral pharyngeal walls are clear, and true cord mobility, gag reflex, tongue mobility, EOMs, facial nerve are all symmetrical.  No salivary gland abnormalities.  Lips, teeth, and gums are unremarkable. NECK:  Otherwise free of any thyromegaly, cervical adenopathy, or mass. CHEST:  Clear, but his chest x-ray shows COPD, increased AP diameter. CARDIOVASCULAR:  No opening snaps, murmurs, or gallops what his EKG showing two different possibilities of myocardial infarction, age undetermined.  His medications are above mentioned.  He has had in January a study that is in the chart.  STUDY CONCLUSIONS:  Left ventricle cavity size normal, wall thickness normal, systolic function is normal.  Estimated ejection fraction in the range of 55-60.  Wall motion was normal.  There were no regional wall motion abnormalities.  Left atrium, atrium was mildly dilated.           ______________________________ Hermelinda Medicus, M.D.     JC/MEDQ  D:  12/08/2010  T:  12/09/2010  Job:  [id number]  cc:   Thomas C. Daleen Squibb, MD, [facility] Charlcie Cradle. Delford Field, MD, [facility]  Electronically Signed by Hermelinda Medicus M.D. on 12/16/2010 11:06:29 AM

## 2010-12-16 NOTE — Op Note (Signed)
  [name], [name]              ACCOUNT NO.:  [id number]  MEDICAL RECORD NO.:  [id number]           PATIENT TYPE:  O  LOCATION:  SDSC                         FACILITY:  [facility]  PHYSICIAN:  Hermelinda Medicus, M.D.   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  OPERATION:  Excision of all three of the lesions with a primary closure on the two left, than on the right, a postauricular full-thickness skin graft replacement of the skin defect.  OPERATOR:  Hermelinda Medicus, MD  ANESTHESIA:  Local MAC with Dr. Noreene Larsson.  PROCEDURE:  The patient was placed in supine position.  Under local MAC anesthesia, the left side was prepped and draped appropriately first and these lesions were excised and sent for pathologic evaluation and closure was with primary 4-0 and 5-0 Ethilon after hemostatic control of any bleeding using the Bovie electrocoagulation.  Once the dressings were applied, the head was turned and the right ear was placed in attention and of course, the Xylocaine with epinephrine was used 1% with all lesions that were removed.  Once this was established and also postauricular donor site was anesthetized, we removed this lesion close the corners of it primarily, but then a full-thickness skin graft was taken from the postauricular region.  This postauricular region was then sutured and closed using of course a fresh suture of 4-0 Ethilon and then full-thickness graft was taken and used to repair the excision site.  This was closed with 4-0 and 5-0  Ethilon and a mastoid-type circumferential dressing was applied.  The patient tolerated the procedure very well and was doing well postoperatively. He will be kept overnight because of his cardiac and pulmonary history, but has done very well.  His follow up will then be in 3 days, then 5 days, 10 days, then 3 weeks, 6 weeks, and 6 months and a year.          ______________________________ Hermelinda Medicus, M.D.     JC/MEDQ  D:  12/08/2010  T:  12/09/2010  Job:  [id number]  cc:   Charlcie Cradle. Delford Field, MD, FCCP [name], MD, [facility]  Electronically Signed by Hermelinda Medicus M.D. on 12/16/2010 11:06:26 AM

## 2010-12-21 ENCOUNTER — Other Ambulatory Visit: Payer: Self-pay | Admitting: Cardiology

## 2011-03-18 ENCOUNTER — Telehealth: Payer: Self-pay | Admitting: Critical Care Medicine

## 2011-03-18 MED ORDER — NEBIVOLOL HCL 20 MG PO TABS: 1.0000 | ORAL_TABLET | Freq: Every day | ORAL | Status: DC

## 2011-03-18 NOTE — Telephone Encounter (Signed)
Spoke with pt wife and advised her pt was due for OV. Pt is scheduled to come in 9/6 at 10:45 in [place] location. I advised pt of address, phone #, and explained directions to pt wife. Nothing further was needed and was aware he needed to show up for OV for further refills. Last rx for bystolic was sent 08/13/10 #30 x 6 refills.   rx was sent

## 2011-03-25 ENCOUNTER — Encounter: Payer: Self-pay | Admitting: Critical Care Medicine

## 2011-03-25 ENCOUNTER — Ambulatory Visit (INDEPENDENT_AMBULATORY_CARE_PROVIDER_SITE_OTHER): Payer: Medicare Other | Admitting: Critical Care Medicine

## 2011-03-25 DIAGNOSIS — J449 Chronic obstructive pulmonary disease, unspecified: Secondary | ICD-10-CM

## 2011-03-25 DIAGNOSIS — I10 Essential (primary) hypertension: Secondary | ICD-10-CM

## 2011-03-25 MED ORDER — NEBIVOLOL HCL 20 MG PO TABS: 1.0000 | ORAL_TABLET | Freq: Every day | ORAL | Status: DC

## 2011-03-25 NOTE — Progress Notes (Signed)
Subjective:    Patient ID: [name], male    DOB: 04/04/1931, 77 y.o.   MRN: [id number]  HPI Comments:  77 year old man with hx of Asthma   08/13/10 -Initial pulmonary consult. Presents for dyspnea and syncope  initial evaluation.  The patient complains of shortness of breath, chest tightness, wheezing, and cough, but denies history of diagnosed COPD, chest pain worse with breathing and coughing, mucous production, nocturnal awakening, exercise induced symptoms, and congestion.  Prior evaluation and testing has included Cxr.  The dyspnea is described as at rest, with ADL's, with walking within room, with walking one or two blocks, with walking stairs, and during the day.  Associated disease(s) include(s) productive cough and non-productive cough.  Oxygen evaluation is described as on supplemental O2; _2_ l/min rest but pt is noncompliant with oxygen.  Prior effective treatment has included short acting beta agonists.     THis pt has been passing out for  3-4 times.  The pt notes if walk across the road will pass out THe is not dyspneic at rest  or exertion.    THe pt is dizzy on exertion. The pt has tried rx oxygen at home.  He has not been compliant   August 18, 2010 3:27 PM notes breathing is better CT angio was neg for PE pfts showed obstruction and restriction no headaches   10/16/2010 Follow up -Returns for 2 months follow up for COPD . His dyspnea is about the same - OCcas prod cough (clear, yellow) .  He is feeling better.  He is not as weak since he was changed to [employer]. Dizziness/syncope has totally resolved.  He has more endurance , can walk farther now. Can walk 1/4 mile without stopping. He admits he does not always wear O2 when walking. Wears as needed during daytime , but wears all night.  Does tell me he does not take Advair regularly due to cost factor. He appreciates samples. He does get some ankle edema worse in evening. Explained that wearing O2 may help with walking.  He desated to 88% with walking from lobby to exam  room on room air.   Shortness of Breath  03/25/2011 Since last ov not as much cough.  Dyspnea is better.  Wears oxygen more consistently No real chest pain.  Notes some pedal edema.     Review of Systems  Respiratory: Positive for shortness of breath.      Constitutional:   No  weight loss, night sweats,  Fevers, chills, fatigue, lassitude. HEENT:   No headaches,  Difficulty swallowing,  Tooth/dental problems,  Sore throat,                No sneezing, itching, ear ache, nasal congestion, post nasal drip,   CV:  No chest pain,  Orthopnea, PND,  anasarca, dizziness, palpitations  GI  No heartburn, indigestion, abdominal pain, nausea, vomiting, diarrhea, change in bowel habits, loss of appetite  Resp: No shortness of breath with exertion or at rest.  No excess mucus, no productive cough,  No non-productive cough,  No coughing up of blood.  No change in color of mucus.  No wheezing.  No chest wall deformity Neg snoring or hypersolomence  Skin: no rash or lesions.  GU: no dysuria, change in color of urine, no urgency or frequency.  No flank pain.  MS:  No joint pain or swelling.  No decreased range of motion.  No back pain.  Psych:  No change in  mood or affect. No depression or anxiety.  No memory loss.  Objective:   Physical Exam  Gen: Pleasant, obese , in no distress,  normal affect  ENT: No lesions,  mouth clear,  oropharynx clear, no postnasal drip, class 2 airway   Neck: No JVD, no TMG, no carotid bruits  Lungs: No use of accessory muscles, no dullness to percussion, clear without rales or rhonchi, Diminshed BS in bases   Cardiovascular: RRR, heart sounds normal, no murmur or gallops, 1+ peripheral edema  Abdomen: soft and NT, no HSM,  BS normal, obese   Musculoskeletal: No deformities, no cyanosis or clubbing  Neuro: alert, non focal  Skin: Warm, no lesions or rashes          Assessment & Plan:    Chronic  obstructive asthma NEVER SMOKER, significant passive smoke exposure  PFTs 08/13/10 >>FEV1 1.45l/m (52%), ratio 74 , no sign. Change with bronchodilator (8%) , DLCO 80% Intolerant of advair Improved with d/c of nonselective beta blocker -improved with change to bystolic 07/2010  [place] - On O2 2  l/m at rest, 3 l/m walking.   Intolerant of inhalers, fixed airflow obstruction Plan Manage off inhalers for now Cont oxygen 2L rest , 3L exertion Rov 1 year bystolic refilled, this has helped for pt to be on a more selective BB    Updated Medication List Outpatient Encounter Prescriptions as of 03/25/2011  Medication Sig Dispense Refill  . aspirin 325 MG tablet Take 325 mg by mouth daily.        Marland Kitchen DIOVAN HCT 160-12.5 MG per tablet TAKE ONE & ONE-HALF TABLETS BY MOUTH IN THE MORNING.  45 each  12  . LIPITOR 10 MG tablet TAKE ONE TABLET BY MOUTH AT BEDTIME.  30 each  12  . Meclizine HCl (BONINE) 25 MG CHEW Chew 1 tablet by mouth 2 (two) times daily.        . Multiple Vitamins-Minerals (MULTIVITAMIN & MINERAL PO) Take 1 tablet by mouth daily.        . Nebivolol HCl (BYSTOLIC) 20 MG TABS Take 1 tablet (20 mg total) by mouth daily.  30 tablet  12  . nitroGLYCERIN (NITROSTAT) 0.4 MG SL tablet Place 0.4 mg under the tongue every 5 (five) minutes as needed.        Marland Kitchen DISCONTD: Nebivolol HCl (BYSTOLIC) 20 MG TABS Take 1 tablet (20 mg total) by mouth daily.  30 tablet  0  . DISCONTD: Fluticasone-Salmeterol (ADVAIR DISKUS) 250-50 MCG/DOSE AEPB Inhale 1 puff into the lungs every 12 (twelve) hours.

## 2011-03-25 NOTE — Patient Instructions (Signed)
1 year refill on bystolic sent Use oxygen 2liter rest and 3liter exertion Ok to stop advair Return 1 year or sooner if necessary

## 2011-03-25 NOTE — Assessment & Plan Note (Signed)
NEVER SMOKER, significant passive smoke exposure  PFTs 08/13/10 >>FEV1 1.45l/m (52%), ratio 74 , no sign. Change with bronchodilator (8%) , DLCO 80% Intolerant of advair Improved with d/c of nonselective beta blocker -improved with change to bystolic 07/2010  Desats - On O2 2  l/m at rest, 3 l/m walking.   Intolerant of inhalers, fixed airflow obstruction Plan Manage off inhalers for now Cont oxygen 2L rest , 3L exertion Rov 1 year bystolic refilled, this has helped for pt to be on a more selective BB

## 2011-04-26 LAB — URINE MICROSCOPIC-ADD ON

## 2011-04-26 LAB — URINALYSIS, ROUTINE W REFLEX MICROSCOPIC
Bilirubin Urine: NEGATIVE
Glucose, UA: NEGATIVE
Ketones, ur: NEGATIVE
Nitrite: NEGATIVE
Protein, ur: 30 — AB
Specific Gravity, Urine: 1.023
Urobilinogen, UA: 1
pH: 7

## 2011-04-26 LAB — CBC
HCT: 46.5
Hemoglobin: 16.1
MCHC: 34.6
MCV: 90
Platelets: 259
RBC: 5.17
RDW: 13.8
WBC: 8.4

## 2011-04-26 LAB — BASIC METABOLIC PANEL WITH GFR
BUN: 12
Calcium: 9.4
GFR calc non Af Amer: >60
Potassium: 4.2
Sodium: 135

## 2011-04-26 LAB — BASIC METABOLIC PANEL
CO2: 33 — ABNORMAL HIGH
Chloride: 94 — ABNORMAL LOW
Creatinine, Ser: 1.15
GFR calc Af Amer: >60
Glucose, Bld: 98

## 2011-09-14 ENCOUNTER — Telehealth: Payer: Self-pay | Admitting: Critical Care Medicine

## 2011-09-14 NOTE — Telephone Encounter (Signed)
Called pt's home number - [email or web address]

## 2011-09-14 NOTE — Telephone Encounter (Signed)
Pts insurance co called. They want his bystolic and diovan HCT changed to generics  I am ok with changing Bystolic to Zebeta 10mg /d and Diovan/HCT to Losartan 100mg /12.5 /d  # 30 each He will need OV with his PCP or myself for BP recheck 2-4 weeks after this change  Call pt first to tell him about this before calling pharmacy

## 2011-09-16 NOTE — Telephone Encounter (Signed)
[email or web address]  

## 2011-09-16 NOTE — Telephone Encounter (Signed)
Called pt's home # - [email or web address] 

## 2011-09-16 NOTE — Telephone Encounter (Signed)
Pt returned call. [phone]. [name]

## 2011-09-17 NOTE — Telephone Encounter (Signed)
[name] with [employer] called re: status of form (recs for medication therapy) that was faxed on 09-13-11. [phone]. [name]

## 2011-09-17 NOTE — Telephone Encounter (Signed)
[email or web address] for pt -- once I speak with him I will inform insurance co per PW's request below

## 2011-09-20 NOTE — Telephone Encounter (Signed)
I called pt's home # - [email or web address]

## 2011-09-22 NOTE — Telephone Encounter (Signed)
Called, spoke with pt.  I informed him of below per Dr. Delford Field.  Pt would like to try the zebeta 10 mg daily in place of bystolic 20 mg daily and losartan 100/12.5 in place of diovan hct 160/12.5.  He would like the rxs to be sent to [facility].  However, the instructions below state pt should take the losartan 100/12.5 qd but pt states he takes the diovan hct 1 tab in the and and 1/2 tab in the evening.  He would like clarification on how he should take the losartan 100/12.5 - ? Should he take it how he is currently taking diovan hct?  He is aware Dr. Delford Field is off and not back in the office until Monday.  He has 6 days of his current BP meds left and ok with call back on Monday regarding clarification.  Dr. Delford Field, pls advise.  Thank you.

## 2011-09-23 MED ORDER — LOSARTAN POTASSIUM-HCTZ 100-12.5 MG PO TABS: ORAL_TABLET | ORAL | Status: DC

## 2011-09-23 NOTE — Telephone Encounter (Signed)
Pt notified that PW is okay with changes in BP med to Losartan 100/12.5. Directions per PW should read 1 tablet in the morning and 1/2 tablet in the evening. Pt verbalized understanding and RX has been sent to [facility] on [address].

## 2011-09-23 NOTE — Telephone Encounter (Signed)
i am ok with these changes He would need to take one losartan/hct in am and 1/2 in PM for the changes to occur

## 2011-09-23 NOTE — Telephone Encounter (Signed)
Addended by: Michel Bickers A on: 09/23/2011 05:13 PM   Modules accepted: Orders

## 2011-10-11 ENCOUNTER — Ambulatory Visit (INDEPENDENT_AMBULATORY_CARE_PROVIDER_SITE_OTHER): Payer: Medicare Other | Admitting: Critical Care Medicine

## 2011-10-11 ENCOUNTER — Encounter: Payer: Self-pay | Admitting: Critical Care Medicine

## 2011-10-11 ENCOUNTER — Ambulatory Visit (HOSPITAL_BASED_OUTPATIENT_CLINIC_OR_DEPARTMENT_OTHER)
Admission: RE | Admit: 2011-10-11 | Discharge: 2011-10-11 | Disposition: A | Discharge status: Home / Self Care | Payer: Medicare Other | Source: Ambulatory Visit | Attending: Critical Care Medicine | Admitting: Critical Care Medicine

## 2011-10-11 VITALS — BP 156/88 | HR 83 | Temp 97.8°F | Ht 71.0 in | Wt 301.0 lb

## 2011-10-11 DIAGNOSIS — R0989 Other specified symptoms and signs involving the circulatory and respiratory systems: Secondary | ICD-10-CM

## 2011-10-11 DIAGNOSIS — I509 Heart failure, unspecified: Secondary | ICD-10-CM

## 2011-10-11 DIAGNOSIS — R0602 Shortness of breath: Secondary | ICD-10-CM

## 2011-10-11 DIAGNOSIS — R0600 Dyspnea, unspecified: Secondary | ICD-10-CM

## 2011-10-11 DIAGNOSIS — I5031 Acute diastolic (congestive) heart failure: Secondary | ICD-10-CM

## 2011-10-11 DIAGNOSIS — I5033 Acute on chronic diastolic (congestive) heart failure: Secondary | ICD-10-CM

## 2011-10-11 DIAGNOSIS — R55 Syncope and collapse: Secondary | ICD-10-CM

## 2011-10-11 DIAGNOSIS — J449 Chronic obstructive pulmonary disease, unspecified: Secondary | ICD-10-CM

## 2011-10-11 LAB — BASIC METABOLIC PANEL
Chloride: 99 mEq/L (ref 96–112)
Creat: 1.17 mg/dL (ref 0.50–1.35)
Sodium: 138 mEq/L (ref 135–145)

## 2011-10-11 MED ORDER — FUROSEMIDE 40 MG PO TABS: 40.0000 mg | ORAL_TABLET | Freq: Every day | ORAL | Status: DC

## 2011-10-11 MED ORDER — HYDRALAZINE HCL 25 MG PO TABS: 25.0000 mg | ORAL_TABLET | Freq: Three times a day (TID) | ORAL | Status: DC

## 2011-10-11 MED ORDER — POTASSIUM CHLORIDE CRYS ER 20 MEQ PO TBCR: 20.0000 meq | EXTENDED_RELEASE_TABLET | Freq: Every day | ORAL | Status: DC

## 2011-10-11 NOTE — Progress Notes (Signed)
Subjective:    Patient ID: [name], male    DOB: 05/30/1931, 78 y.o.   MRN: [id number]  HPI Comments:  78 year old man with hx of Asthma   08/13/10 -Initial pulmonary consult. Presents for dyspnea and syncope  initial evaluation.  The patient complains of shortness of breath, chest tightness, wheezing, and cough, but denies history of diagnosed COPD, chest pain worse with breathing and coughing, mucous production, nocturnal awakening, exercise induced symptoms, and congestion.  Prior evaluation and testing has included Cxr.  The dyspnea is described as at rest, with ADL's, with walking within room, with walking one or two blocks, with walking stairs, and during the day.  Associated disease(s) include(s) productive cough and non-productive cough.  Oxygen evaluation is described as on supplemental O2; _2_ l/min rest but pt is noncompliant with oxygen.  Prior effective treatment has included short acting beta agonists.     THis pt has been passing out for  3-4 times.  The pt notes if walk across the road will pass out THe is not dyspneic at rest  or exertion.    THe pt is dizzy on exertion. The pt has tried rx oxygen at home.  He has not been compliant   August 18, 2010 3:27 PM notes breathing is better CT angio was neg for PE pfts showed obstruction and restriction no headaches   10/16/2010 Follow up -Returns for 2 months follow up for COPD . His dyspnea is about the same - OCcas prod cough (clear, yellow) .  He is feeling better.  He is not as weak since he was changed to [employer]. Dizziness/syncope has totally resolved.  He has more endurance , can walk farther now. Can walk 1/4 mile without stopping. He admits he does not always wear O2 when walking. Wears as needed during daytime , but wears all night.  Does tell me he does not take Advair regularly due to cost factor. He appreciates samples. He does get some ankle edema worse in evening. Explained that wearing O2 may help with walking.  He desated to 88% with walking from lobby to exam  room on room air.  03/30/12 Since last ov not as much cough.  Dyspnea is better.  Wears oxygen more consistently No real chest pain.  Notes some pedal edema.  10/11/2011  Bystolic not helping BP meds Pt feels bad.  Pt notes dyspnea is worse.  No cough.  Notes chest pain.  Notes ant chest pain No wheeze.  Notes more edema and fluid retention. Pt denies any significant sore throat, nasal congestion or excess secretions, fever, chills, sweats, unintended weight loss, pleurtic or exertional chest pain, orthopnea PND, or leg swelling Pt denies any increase in rescue therapy over baseline, denies waking up needing it or having any early am or nocturnal exacerbations of coughing/wheezing/or dyspnea. Pt also denies any obvious fluctuation in symptoms with  weather or environmental change or other alleviating or aggravating factors  Past Medical History  Diagnosis Date  . CAD (coronary artery disease)   . Unspecified essential hypertension   . Other and unspecified hyperlipidemia   . Type II or unspecified type diabetes mellitus without mention of complication, not stated as uncontrolled   . Obesity   . Syncope      No family history on file.   History   Social History  . Marital Status: Married    Spouse Name: N/A    Number of Children: N/A  . Years of Education: N/A  Occupational History  . Retired     English as a second language teacher   Social History Main Topics  . Smoking status: Never Smoker   . Smokeless tobacco: Never Used  . Alcohol Use: No  . Drug Use: No  . Sexually Active: Not on file   Other Topics Concern  . Not on file   Social History Narrative  . No narrative on file     No Known Allergies   Outpatient Prescriptions Prior to Visit  Medication Sig Dispense Refill  . aspirin 325 MG tablet Take 325 mg by mouth daily.        Marland Kitchen LIPITOR 10 MG tablet TAKE ONE TABLET BY MOUTH AT BEDTIME.  30 each  12  .  losartan-hydrochlorothiazide (HYZAAR) 100-12.5 MG per tablet Take 1 tablet in the morning and 1/2 tablet in the evening.  45 tablet  3  . Meclizine HCl (BONINE) 25 MG CHEW Chew 1 tablet by mouth 2 (two) times daily.        . Multiple Vitamins-Minerals (MULTIVITAMIN & MINERAL PO) Take 1 tablet by mouth daily.        . Nebivolol HCl (BYSTOLIC) 20 MG TABS Take 1 tablet (20 mg total) by mouth daily.  30 tablet  12  . nitroGLYCERIN (NITROSTAT) 0.4 MG SL tablet Place 0.4 mg under the tongue every 5 (five) minutes as needed.             Review of Systems   Constitutional:   No  weight loss, night sweats,  Fevers, chills, fatigue, lassitude. HEENT:   No headaches,  Difficulty swallowing,  Tooth/dental problems,  Sore throat,                No sneezing, itching, ear ache, nasal congestion, post nasal drip,   CV:  Notes chest pain,  ++Orthopnea,++ PND, ++ anasarca, dizziness, palpitations  ++LE edema  GI  No heartburn, indigestion, abdominal pain, nausea, vomiting, diarrhea, change in bowel habits, loss of appetite  Resp: Notes shortness of breath with exertion and at rest.  No excess mucus, no productive cough,  No non-productive cough,  No coughing up of blood.  No change in color of mucus.  No wheezing.  No chest wall deformity Neg snoring or hypersolomence  Skin: no rash or lesions.  GU: no dysuria, change in color of urine, no urgency or frequency.  No flank pain.  MS:  No joint pain or swelling.  No decreased range of motion.  No back pain.  Psych:  No change in mood or affect. No depression or anxiety.  No memory loss.  Objective:   Physical Exam  Gen:  obese , in no distress, anxious  ENT: No lesions,  mouth clear,  oropharynx clear, no postnasal drip, class 2 airway   Neck: ++ JVD, no TMG, no carotid bruits  Lungs: No use of accessory muscles, no dullness to percussion, bibasilar rales Diminshed BS in bases   Cardiovascular: RRR, heart sounds normal, no murmur or gallops, 3+  peripheral edema  Abdomen: soft and NT, no HSM,  BS normal, obese   Musculoskeletal: No deformities, no cyanosis or clubbing  Neuro: alert, non focal  Skin: Warm, no lesions or rashes          Assessment & Plan:    Chronic obstructive asthma Lifelong never smoker with passive smoke exposure and associated chronic obstructive asthma. This patient has been poorly tolerant of inhaled bronchodilators. The patient was on a nonselective beta blocker at high dose  and improved by changing to Bystolic 1 year ago. However now blood pressure control is not satisfactory on 20 mg daily of Bystolic. The patient is on home oxygen. Plan Review  congestive heart failure evaluation  Diastolic CHF, acute on chronic This patient has hypertension poorly controlled with evidence of volume excess on exam on today's date 10/11/2011 This patient has a history of ejection fraction 55-65%. There has been diastolic dysfunction detected on previous echocardiograms. The patient has a history of coronary artery disease but had no ischemia during the last evaluation of this noninvasively 1 year ago. The patient has not seen cardiology in over a year. Patient does complain of some ongoing anterior chest pain which is vague and nonspecific.  Plan We'll continue Bystolic and losartan HCTZ at current doses Add hydralazine 25 mg 3 times daily Add Lasix 40 mg daily with potassium 20 mEq daily Obtain chest x-ray Obtain baseline being BMP and BNP Refer back to cardiology for appointment the week of 10/11/2011      Updated Medication List Outpatient Encounter Prescriptions as of 10/11/2011  Medication Sig Dispense Refill  . aspirin 325 MG tablet Take 325 mg by mouth daily.        Marland Kitchen LIPITOR 10 MG tablet TAKE ONE TABLET BY MOUTH AT BEDTIME.  30 each  12  . losartan-hydrochlorothiazide (HYZAAR) 100-12.5 MG per tablet Take 1 tablet in the morning and 1/2 tablet in the evening.  45 tablet  3  . Meclizine HCl (BONINE)  25 MG CHEW Chew 1 tablet by mouth 2 (two) times daily.        . Multiple Vitamins-Minerals (MULTIVITAMIN & MINERAL PO) Take 1 tablet by mouth daily.        . Nebivolol HCl (BYSTOLIC) 20 MG TABS Take 1 tablet (20 mg total) by mouth daily.  30 tablet  12  . nitroGLYCERIN (NITROSTAT) 0.4 MG SL tablet Place 0.4 mg under the tongue every 5 (five) minutes as needed.        . furosemide (LASIX) 40 MG tablet Take 1 tablet (40 mg total) by mouth daily.  30 tablet  11  . hydrALAZINE (APRESOLINE) 25 MG tablet Take 1 tablet (25 mg total) by mouth 3 (three) times daily.  90 tablet  6  . potassium chloride SA (K-DUR,KLOR-CON) 20 MEQ tablet Take 1 tablet (20 mEq total) by mouth daily.  30 tablet  6

## 2011-10-11 NOTE — Assessment & Plan Note (Signed)
Lifelong never smoker with passive smoke exposure and associated chronic obstructive asthma. This patient has been poorly tolerant of inhaled bronchodilators. The patient was on a nonselective beta blocker at high dose and improved by changing to Bystolic 1 year ago. However now blood pressure control is not satisfactory on 20 mg daily of Bystolic. The patient is on home oxygen. Plan Review  congestive heart failure evaluation

## 2011-10-11 NOTE — Patient Instructions (Addendum)
Stay on losartan and bystolic Start hydralazine one three times daily Start Lasix one daily Start KDUR (potassium) one daily Obtain a chest xray today An appointment with Dr Daleen Squibb or one of his associates will be made Labs today Return 2 months [place] office

## 2011-10-11 NOTE — Progress Notes (Signed)
Quick Note:  Notify the patient that the Xray is stable and no pneumonia, no excess fluid on lungs No change in medications are recommended. Continue current meds as prescribed at last office visit ______

## 2011-10-11 NOTE — Assessment & Plan Note (Signed)
This patient has hypertension poorly controlled with evidence of volume excess on exam on today's date 10/11/2011 This patient has a history of ejection fraction 55-65%. There has been diastolic dysfunction detected on previous echocardiograms. The patient has a history of coronary artery disease but had no ischemia during the last evaluation of this noninvasively 1 year ago. The patient has not seen cardiology in over a year. Patient does complain of some ongoing anterior chest pain which is vague and nonspecific.  Plan We'll continue Bystolic and losartan HCTZ at current doses Add hydralazine 25 mg 3 times daily Add Lasix 40 mg daily with potassium 20 mEq daily Obtain chest x-ray Obtain baseline being BMP and BNP Refer back to cardiology for appointment the week of 10/11/2011

## 2011-10-11 NOTE — Progress Notes (Signed)
Quick Note:  Called, spoke with pt. I informed him xray is stable, no pna and no excess fluid on lungs per PW. Advised PW recs no changes from recs from today's OV. He should cont with current meds as prescribed. I did go over his instructions again as per his pt instructions from today. He verbalized understanding of this. ______

## 2011-10-12 ENCOUNTER — Ambulatory Visit: Payer: Medicare Other | Admitting: Nurse Practitioner

## 2011-10-12 LAB — BRAIN NATRIURETIC PEPTIDE: Brain Natriuretic Peptide: 83.7 pg/mL (ref 0.0–100.0)

## 2011-10-12 NOTE — Progress Notes (Signed)
Quick Note:  Call pt and tell him labs are ok, No change in medications from our recommendations as before ______

## 2011-10-13 ENCOUNTER — Ambulatory Visit (INDEPENDENT_AMBULATORY_CARE_PROVIDER_SITE_OTHER): Payer: Medicare Other | Admitting: Nurse Practitioner

## 2011-10-13 ENCOUNTER — Encounter: Payer: Self-pay | Admitting: Nurse Practitioner

## 2011-10-13 DIAGNOSIS — I5033 Acute on chronic diastolic (congestive) heart failure: Secondary | ICD-10-CM

## 2011-10-13 DIAGNOSIS — I509 Heart failure, unspecified: Secondary | ICD-10-CM

## 2011-10-13 DIAGNOSIS — I2510 Atherosclerotic heart disease of native coronary artery without angina pectoris: Secondary | ICD-10-CM

## 2011-10-13 DIAGNOSIS — I10 Essential (primary) hypertension: Secondary | ICD-10-CM

## 2011-10-13 DIAGNOSIS — R0602 Shortness of breath: Secondary | ICD-10-CM

## 2011-10-13 DIAGNOSIS — R079 Chest pain, unspecified: Secondary | ICD-10-CM

## 2011-10-13 DIAGNOSIS — R0600 Dyspnea, unspecified: Secondary | ICD-10-CM

## 2011-10-13 LAB — BASIC METABOLIC PANEL
BUN: 23 mg/dL (ref 6–23)
CO2: 33 mEq/L — ABNORMAL HIGH (ref 19–32)
Calcium: 9.2 mg/dL (ref 8.4–10.5)
Chloride: 96 mEq/L (ref 96–112)
Creatinine, Ser: 1.3 mg/dL (ref 0.4–1.5)
GFR: 58.12 mL/min — ABNORMAL LOW (ref >60.00)
Glucose, Bld: 151 mg/dL — ABNORMAL HIGH (ref 70–99)
Potassium: 3.8 mEq/L (ref 3.5–5.1)
Sodium: 137 mEq/L (ref 135–145)

## 2011-10-13 LAB — CBC WITH DIFFERENTIAL/PLATELET
Basophils Absolute: 0 10*3/uL (ref 0.0–0.1)
Basophils Relative: 0.3 % (ref 0.0–3.0)
Eosinophils Absolute: 0.5 10*3/uL (ref 0.0–0.7)
Eosinophils Relative: 5.5 % — ABNORMAL HIGH (ref 0.0–5.0)
HCT: 46.7 % (ref 39.0–52.0)
Hemoglobin: 15.5 g/dL (ref 13.0–17.0)
Lymphocytes Relative: 28.3 % (ref 12.0–46.0)
Lymphs Abs: 2.4 10*3/uL (ref 0.7–4.0)
MCHC: 33.2 g/dL (ref 30.0–36.0)
MCV: 95.4 fl (ref 78.0–100.0)
Monocytes Absolute: 0.5 10*3/uL (ref 0.1–1.0)
Monocytes Relative: 5.2 % (ref 3.0–12.0)
Neutro Abs: 5.2 10*3/uL (ref 1.4–7.7)
Neutrophils Relative %: 60.7 % (ref 43.0–77.0)
Platelets: 271 10*3/uL (ref 150.0–400.0)
RBC: 4.9 Mil/uL (ref 4.22–5.81)
RDW: 14.1 % (ref 11.5–14.6)
WBC: 8.6 10*3/uL (ref 4.5–10.5)

## 2011-10-13 LAB — BRAIN NATRIURETIC PEPTIDE: Pro B Natriuretic peptide (BNP): 80 pg/mL (ref 0.0–100.0)

## 2011-10-13 MED ORDER — LOSARTAN POTASSIUM 100 MG PO TABS: 100.0000 mg | ORAL_TABLET | Freq: Every day | ORAL | Status: DC

## 2011-10-13 MED ORDER — ISOSORBIDE MONONITRATE ER 30 MG PO TB24: 30.0000 mg | ORAL_TABLET | Freq: Every day | ORAL | Status: DC

## 2011-10-13 NOTE — Patient Instructions (Signed)
We need to recheck some labs today.  We are going to arrange for an ultrasound of your heart and a stress test  Stop the Losartan HCTZ  Start Losartan 100 mg each day  Start Imdur 30 mg each day  We will see you back after your tests are complete  Use your NTG under your tongue for recurrent chest pain. May take one tablet every 5 minutes. If you are still having discomfort after 3 tablets in 15 minutes, call [phone].  Call the [facility] office at [phone] if you have any questions, problems or concerns.

## 2011-10-13 NOTE — Progress Notes (Signed)
[name] Date of Birth: [birth year] Medical Record #[id number]  History of Present Illness: [name] is seen today for a work in visit at the request of Dr. Delford Field. He is seen for Dr. Daleen Squibb. He has a known history of CAD. His last Myoview was in 2011 and showed an EF of 55% with normal perfusion. He has had CABG back in 1995. He has HTN, DM, HLD, obesity and reported diastolic dysfunction.His last echocardiogram was performed in January of 2012 and showed a normal EF. There was no mention of diastolic dysfunction. He has not been seen here in this office in over a year. There appears to be some degree of noncompliance according to the history. He has had a long standing history of passing out with walking that seemed to be related to hypoxia. He does not wear his oxygen as prescribed.   He comes in today. He is here alone. He saw Dr. Delford Field earlier this week. Blood pressure was poorly controlled and he was complaining of some vague chest pain. Hydralazine was added. He was also felt to have some volume overload. Dr. Delford Field reports an EF of 55 to 60%. Lasix and potassium was added in addition to his Bystolic and Losartan HCT. On his follow up today, he says he is just not doing well and hasn't been for several months. He does report a vague chest discomfort periodically. He has not used NTG in over 6 to 7 months but does have it on hand. His discomfort is described as a dullness and is relieved with rest. He is chronically short of breath. Blood sugars are unknown. Blood pressure is improved. He thinks he does not feel good due to his Losartan HCTZ. His swelling is better since the Lasix was added. We will need to recheck labs today.   Current Outpatient Prescriptions on File Prior to Visit  Medication Sig Dispense Refill  . aspirin 325 MG tablet Take 325 mg by mouth daily.        . furosemide (LASIX) 40 MG tablet Take 1 tablet (40 mg total) by mouth daily.  30 tablet  11  . hydrALAZINE  (APRESOLINE) 25 MG tablet Take 1 tablet (25 mg total) by mouth 3 (three) times daily.  90 tablet  6  . LIPITOR 10 MG tablet TAKE ONE TABLET BY MOUTH AT BEDTIME.  30 each  12  . Meclizine HCl (BONINE) 25 MG CHEW Chew 1 tablet by mouth 2 (two) times daily.        . Multiple Vitamins-Minerals (MULTIVITAMIN & MINERAL PO) Take 1 tablet by mouth daily.        . Nebivolol HCl (BYSTOLIC) 20 MG TABS Take 1 tablet (20 mg total) by mouth daily.  30 tablet  12  . nitroGLYCERIN (NITROSTAT) 0.4 MG SL tablet Place 0.4 mg under the tongue every 5 (five) minutes as needed.        . potassium chloride SA (K-DUR,KLOR-CON) 20 MEQ tablet Take 1 tablet (20 mEq total) by mouth daily.  30 tablet  6    No Known Allergies  Past Medical History  Diagnosis Date  . CAD (coronary artery disease)     remote CABG in 1995 per Dr. Laneta Simmers. Last myoview in 2011 with normal perfusion and normal EF  . HTN (hypertension)   . HLD (hyperlipidemia)   . Type II or unspecified type diabetes mellitus without mention of complication, not stated as uncontrolled   . Obesity   . Syncope   .  COPD (chronic obstructive pulmonary disease)   . Hypoxia     Past Surgical History  Procedure Date  . Coronary artery bypass graft 1995  . Functional endoscopic sinus surgery 2006  . Inguinal hernia repair 2006  . Hydrocele excision 2005  . Prostate surgery 1998    History  Smoking status  . Never Smoker   Smokeless tobacco  . Never Used    History  Alcohol Use No    History reviewed. No pertinent family history.  Review of Systems: The review of systems is per the HPI.  He is always short of breath. Says he is using his oxygen more now. Does not have on today. Some cough. Dull chest pain reported with some activities of daily living. Salt use is questionable. Not a lot of help at home with him and his wife. He says he has not had a syncopal spell in several months. He has a lot of arthritis, especially in his knees. All other  systems were reviewed and are negative.  Physical Exam: BP 122/60  Pulse 78  Ht 5\' 11"  (1.803 m)  Wt 301 lb (136.533 kg)  BMI 41.98 kg/m2 Patient is pleasant and in no acute distress. He is morbidly obese. Skin is warm and dry. Color is normal.  HEENT is unremarkable except for poor dentition. Normocephalic/atraumatic. PERRL. Sclera are nonicteric. Neck is supple. No masses. No JVD. Lungs are fairly clear. Cardiac exam shows a regular rate and rhythm. Abdomen is obese but soft. Extremities are with just trace edema today. Gait and ROM are intact. No gross neurologic deficits noted.   LABORATORY DATA: EKG today shows sinus rhythm. Anterior MI of undetermined age. Inferior Q's. Tracing is unchanged.   Repeat labs today is pending.  Lab Results  Component Value Date   WBC 7.9 12/04/2010   HGB 15.6 12/04/2010   HCT 46.2 12/04/2010   PLT 165 12/04/2010   GLUCOSE 127* 10/11/2011   NA 138 10/11/2011   K 3.9 10/11/2011   CL 99 10/11/2011   CREATININE 1.17 10/11/2011   BUN 22 10/11/2011   CO2 31 10/11/2011   TSH 1.79 08/10/2010   HGBA1C 6.2 08/10/2010    Assessment / Plan:

## 2011-10-13 NOTE — Assessment & Plan Note (Signed)
Blood pressure is better today with the addition of hydralazine.

## 2011-10-13 NOTE — Assessment & Plan Note (Signed)
Weight is unchanged. I have stopped his losartan HCT and placed him just on plain losartan. Nitrates are added. We are rechecking his labs today. He will remain on Lasix. We will update his echo. I have asked him to follow up with Dr. Daleen Squibb after his studies are complete. He is encouraged to cut back on his salt. Patient is agreeable to this plan and will call if any problems develop in the interim.

## 2011-10-13 NOTE — Assessment & Plan Note (Signed)
Patient presents with recurrent chest pain. Has known CAD with remote CABG. I have added Imdur 30 mg to his regimen. We will arrange for repeat stress testing. He is encouraged to use his sl NTG as needed.

## 2011-10-14 NOTE — Progress Notes (Signed)
Quick Note:  Called, spoke with pt. I informed him labs are ok, no change in meds or recs from last OV per PW. He verbalized understanding of this and voiced no further questions/concerns at this time. ______

## 2011-10-22 ENCOUNTER — Telehealth: Payer: Self-pay | Admitting: Critical Care Medicine

## 2011-10-22 NOTE — Telephone Encounter (Signed)
I spoke with [place] from [place] and she states that she needed the dx code for the labs pt had on 10/11/11 to bill to insurance. She stated it was sent over to them w/o the dx on it. I advised her what was in pt chart. dyspnea and acute diastolic chf. She voiced her understanding and needed nothing further

## 2011-10-25 ENCOUNTER — Ambulatory Visit (HOSPITAL_COMMUNITY): Payer: Medicare Other | Attending: Cardiology | Admitting: Radiology

## 2011-10-25 VITALS — BP 164/97 | Ht 71.0 in | Wt 301.0 lb

## 2011-10-25 DIAGNOSIS — R0602 Shortness of breath: Secondary | ICD-10-CM | POA: Insufficient documentation

## 2011-10-25 DIAGNOSIS — E785 Hyperlipidemia, unspecified: Secondary | ICD-10-CM | POA: Insufficient documentation

## 2011-10-25 DIAGNOSIS — I2510 Atherosclerotic heart disease of native coronary artery without angina pectoris: Secondary | ICD-10-CM

## 2011-10-25 DIAGNOSIS — J4489 Other specified chronic obstructive pulmonary disease: Secondary | ICD-10-CM | POA: Insufficient documentation

## 2011-10-25 DIAGNOSIS — E119 Type 2 diabetes mellitus without complications: Secondary | ICD-10-CM | POA: Insufficient documentation

## 2011-10-25 DIAGNOSIS — R0609 Other forms of dyspnea: Secondary | ICD-10-CM | POA: Insufficient documentation

## 2011-10-25 DIAGNOSIS — E669 Obesity, unspecified: Secondary | ICD-10-CM | POA: Insufficient documentation

## 2011-10-25 DIAGNOSIS — J449 Chronic obstructive pulmonary disease, unspecified: Secondary | ICD-10-CM | POA: Insufficient documentation

## 2011-10-25 DIAGNOSIS — R079 Chest pain, unspecified: Secondary | ICD-10-CM | POA: Insufficient documentation

## 2011-10-25 DIAGNOSIS — R55 Syncope and collapse: Secondary | ICD-10-CM | POA: Insufficient documentation

## 2011-10-25 DIAGNOSIS — Z951 Presence of aortocoronary bypass graft: Secondary | ICD-10-CM | POA: Insufficient documentation

## 2011-10-25 DIAGNOSIS — I509 Heart failure, unspecified: Secondary | ICD-10-CM | POA: Insufficient documentation

## 2011-10-25 DIAGNOSIS — I10 Essential (primary) hypertension: Secondary | ICD-10-CM | POA: Insufficient documentation

## 2011-10-25 DIAGNOSIS — R0600 Dyspnea, unspecified: Secondary | ICD-10-CM

## 2011-10-25 DIAGNOSIS — R42 Dizziness and giddiness: Secondary | ICD-10-CM | POA: Insufficient documentation

## 2011-10-25 DIAGNOSIS — R0989 Other specified symptoms and signs involving the circulatory and respiratory systems: Secondary | ICD-10-CM | POA: Insufficient documentation

## 2011-10-25 MED ORDER — REGADENOSON 0.4 MG/5ML IV SOLN
0.4000 mg | Freq: Once | INTRAVENOUS | Status: AC
  Administered 2011-10-25: 0.4 mg via INTRAVENOUS

## 2011-10-25 MED ORDER — TECHNETIUM TC 99M TETROFOSMIN IV KIT
33.0000 | PACK | Freq: Once | INTRAVENOUS | Status: AC | PRN
  Administered 2011-10-25: 33 via INTRAVENOUS

## 2011-10-25 NOTE — Progress Notes (Signed)
[facility] SITE 3 NUCLEAR MED [address] [place] [address] [phone]  Cardiology Nuclear Med Study  [name] is a 78 y.o. male     MRN : [id number]     DOB: Dec 01, 1930  Procedure Date: 10/25/2011  Nuclear Med Background Indication for Stress Test:  Evaluation for Ischemia and Graft Patency History: CHF, COPD, 1995 CABG x3, 2011 MPS: NL EF:55%, 07/2010 ECHO: EF:55% Cardiac Risk Factors: Hypertension, Lipids, NIDDM and Obesity  Symptoms:  Chest Pain, Chest Pain, Dizziness, SOB and Syncope   Nuclear Pre-Procedure Caffeine/Decaff Intake:  None NPO After: 7:00pm   Lungs:  clear O2 Sat: 95% on room air. IV 0.9% NS with Angio Cath:  22g  IV Site: R Hand  IV Started by:  Stanton Kidney, EMT-P  Chest Size (in):  50 Cup Size: n/a  Height: 5\' 11"  (1.803 m)  Weight:  301 lb (136.533 kg)  BMI:  Body mass index is 41.98 kg/(m^2). Tech Comments:  Meds were taken as directed at 6:30am, per patient.    Nuclear Med Study 1 or 2 day study: 2 day Stress Test Type:  Lexiscan  Reading QV:ZDGLO Jatoria Kneeland,M.D. Order Authorizing Provider:  Valera Castle MD  Resting Radionuclide: Technetium 59m Tetrofosmin  Resting Radionuclide Dose: 33.0 mCi           On 10-26-11   Stress Radionuclide:  Technetium 90m Tetrofosmin  Stress Radionuclide Dose:33.0 mCi on               10-25-11                                 Stress Protocol Rest HR: 58 Stress HR: 82  Rest BP: 164/97 Stress BP: 163/79  Exercise Time (min): n/a METS: n/a   Predicted Max HR: 141 bpm % Max HR: 58.16 bpm Rate Pressure Product: 75643   Dose of Adenosine (mg):  n/a Dose of Lexiscan: 0.4 mg  Dose of Atropine (mg): n/a Dose of Dobutamine: n/a mcg/kg/min (at max HR)  Stress Test Technologist: Milana Na, EMT-P  Nuclear Technologist:  Domenic Polite, CNMT     Rest Procedure:  Myocardial perfusion imaging was performed at rest 45 minutes following the intravenous administration of Technetium 67m  Tetrofosmin. Rest ECG: NSR - Normal EKG  Stress Procedure:  The patient received IV Lexiscan 0.4 mg over 15-seconds.  Technetium 76m Tetrofosmin injected at 30-seconds.  There were no significant changes, + sob, and rare pvcs with Lexiscan.  Quantitative spect images were obtained after a 45 minute delay. Stress ECG: No significant change from baseline ECG  QPS Raw Data Images:  Normal; no motion artifact; normal heart/lung ratio. Stress Images:  Normal homogeneous uptake in all areas of the myocardium. Rest Images:  Normal homogeneous uptake in all areas of the myocardium. Subtraction (SDS):  Normal Transient Ischemic Dilatation (Normal <1.22):  0.97 Lung/Heart Ratio (Normal <0.45):  0.35  Quantitative Gated Spect Images QGS EDV:  145 ml QGS ESV:  57 ml  Impression Exercise Capacity:  Lexiscan with no exercise. BP Response:  Normal blood pressure response. Clinical Symptoms:  There is dyspnea. ECG Impression:  No significant ST segment change suggestive of ischemia. Comparison with Prior Nuclear Study: No images to compare  Overall Impression:  Normal stress nuclear study.  LV Ejection Fraction: 60%.  LV Wall Motion:  NL LV Function; NL Wall Motion  Charlton Haws

## 2011-10-26 ENCOUNTER — Ambulatory Visit (HOSPITAL_COMMUNITY): Payer: Medicare Other | Attending: Cardiovascular Disease

## 2011-10-26 ENCOUNTER — Other Ambulatory Visit: Payer: Self-pay

## 2011-10-26 DIAGNOSIS — E119 Type 2 diabetes mellitus without complications: Secondary | ICD-10-CM | POA: Insufficient documentation

## 2011-10-26 DIAGNOSIS — I517 Cardiomegaly: Secondary | ICD-10-CM | POA: Insufficient documentation

## 2011-10-26 DIAGNOSIS — I2510 Atherosclerotic heart disease of native coronary artery without angina pectoris: Secondary | ICD-10-CM | POA: Insufficient documentation

## 2011-10-26 DIAGNOSIS — R0600 Dyspnea, unspecified: Secondary | ICD-10-CM

## 2011-10-26 DIAGNOSIS — E669 Obesity, unspecified: Secondary | ICD-10-CM | POA: Insufficient documentation

## 2011-10-26 DIAGNOSIS — R0602 Shortness of breath: Secondary | ICD-10-CM | POA: Insufficient documentation

## 2011-10-26 DIAGNOSIS — I10 Essential (primary) hypertension: Secondary | ICD-10-CM | POA: Insufficient documentation

## 2011-10-26 DIAGNOSIS — E785 Hyperlipidemia, unspecified: Secondary | ICD-10-CM | POA: Insufficient documentation

## 2011-10-26 DIAGNOSIS — R0989 Other specified symptoms and signs involving the circulatory and respiratory systems: Secondary | ICD-10-CM

## 2011-10-26 DIAGNOSIS — R42 Dizziness and giddiness: Secondary | ICD-10-CM | POA: Insufficient documentation

## 2011-10-26 MED ORDER — TECHNETIUM TC 99M TETROFOSMIN IV KIT
33.0000 | PACK | Freq: Once | INTRAVENOUS | Status: AC | PRN
  Administered 2011-10-26: 33 via INTRAVENOUS

## 2011-11-26 ENCOUNTER — Encounter: Payer: Self-pay | Admitting: Cardiology

## 2011-11-26 ENCOUNTER — Ambulatory Visit (INDEPENDENT_AMBULATORY_CARE_PROVIDER_SITE_OTHER): Payer: Medicare Other | Admitting: Cardiology

## 2011-11-26 VITALS — BP 150/80 | HR 78 | Ht 71.0 in | Wt 308.0 lb

## 2011-11-26 DIAGNOSIS — I509 Heart failure, unspecified: Secondary | ICD-10-CM

## 2011-11-26 DIAGNOSIS — I5032 Chronic diastolic (congestive) heart failure: Secondary | ICD-10-CM

## 2011-11-26 DIAGNOSIS — E669 Obesity, unspecified: Secondary | ICD-10-CM

## 2011-11-26 DIAGNOSIS — I10 Essential (primary) hypertension: Secondary | ICD-10-CM

## 2011-11-26 DIAGNOSIS — I25810 Atherosclerosis of coronary artery bypass graft(s) without angina pectoris: Secondary | ICD-10-CM

## 2011-11-26 DIAGNOSIS — E785 Hyperlipidemia, unspecified: Secondary | ICD-10-CM

## 2011-11-26 DIAGNOSIS — I2510 Atherosclerotic heart disease of native coronary artery without angina pectoris: Secondary | ICD-10-CM

## 2011-11-26 NOTE — Patient Instructions (Addendum)
Your physician recommends that you continue on your current medications as directed. Please refer to the Current Medication list given to you today.  Your physician wants you to follow-up in: 1 year. You will receive a reminder letter in the mail two months in advance. If you don't receive a letter, please call our office to schedule the follow-up appointment.  Your physician encouraged you to lose weight for better health.

## 2011-11-26 NOTE — Progress Notes (Signed)
HPI [name] returns today. He was evaluated recently and had a stress Myoview. This showed good LV systolic function with no ischemia. He continues to have dyspnea on exertion. He denies chest pain.  His pulmonary disease and is O2 dependent at night. He is also morbidly obese. I think this is largely responsible for his dyspnea and we discussed today. I do not think i diastolic dysfunction is a major component of this.  Past Medical History  Diagnosis Date  . CAD (coronary artery disease)     remote CABG in 1995 per Dr. Laneta Simmers. Last myoview in 2011 with normal perfusion and normal EF  . HTN (hypertension)   . HLD (hyperlipidemia)   . Type II or unspecified type diabetes mellitus without mention of complication, not stated as uncontrolled   . Obesity   . Syncope   . COPD (chronic obstructive pulmonary disease)   . Hypoxia     Current Outpatient Prescriptions  Medication Sig Dispense Refill  . aspirin 325 MG tablet Take 325 mg by mouth daily.        . furosemide (LASIX) 40 MG tablet Take 1 tablet (40 mg total) by mouth daily.  30 tablet  11  . hydrALAZINE (APRESOLINE) 25 MG tablet Take 1 tablet (25 mg total) by mouth 3 (three) times daily.  90 tablet  6  . isosorbide mononitrate (IMDUR) 30 MG 24 hr tablet Take 1 tablet (30 mg total) by mouth daily.  30 tablet  11  . LIPITOR 10 MG tablet TAKE ONE TABLET BY MOUTH AT BEDTIME.  30 each  12  . losartan (COZAAR) 100 MG tablet Take 1 tablet (100 mg total) by mouth daily.  30 tablet  6  . Meclizine HCl (BONINE) 25 MG CHEW Chew 1 tablet by mouth 2 (two) times daily.        . Multiple Vitamins-Minerals (MULTIVITAMIN & MINERAL PO) Take 1 tablet by mouth daily.        . Nebivolol HCl (BYSTOLIC) 20 MG TABS Take 1 tablet (20 mg total) by mouth daily.  30 tablet  12  . nitroGLYCERIN (NITROSTAT) 0.4 MG SL tablet Place 0.4 mg under the tongue every 5 (five) minutes as needed.        . potassium chloride SA (K-DUR,KLOR-CON) 20 MEQ tablet Take 1 tablet  (20 mEq total) by mouth daily.  30 tablet  6    No Known Allergies  No family history on file.  History   Social History  . Marital Status: Married    Spouse Name: N/A    Number of Children: N/A  . Years of Education: N/A   Occupational History  . Retired     English as a second language teacher   Social History Main Topics  . Smoking status: Never Smoker   . Smokeless tobacco: Never Used  . Alcohol Use: No  . Drug Use: No  . Sexually Active: No   Other Topics Concern  . Not on file   Social History Narrative  . No narrative on file    ROS ALL NEGATIVE EXCEPT THOSE NOTED IN HPI  PE  General Appearance: well developed, well nourished in no acute distress, morbidly obese HEENT: symmetrical face, PERRLA, good dentition  Neck: no JVD, thyromegaly, or adenopathy, trachea midline Chest: symmetric without deformity Cardiac: PMI poorly appreciated,, RRR, soft S1, S2, no gallop or murmur Lung: clear to ausculation and percussion Vascular: all pulses full without bruits  Abdominal: nondistended, nontender, good bowel sounds, no HSM, no bruits  Extremities: no cyanosis, clubbing , chronic edematous changes with 1+ pitting edema pretibially no sign of DVT, no varicosities  Skin: normal color, no rashes Neuro: alert and oriented x 3, non-focal Pysch: normal affect  EKG  BMET    Component Value Date/Time   NA 137 10/13/2011 0931   K 3.8 10/13/2011 0931   CL 96 10/13/2011 0931   CO2 33* 10/13/2011 0931   GLUCOSE 151* 10/13/2011 0931   BUN 23 10/13/2011 0931   CREATININE 1.3 10/13/2011 0931   CREATININE 1.17 10/11/2011 1032   CALCIUM 9.2 10/13/2011 0931   GFRNONAA >60 12/04/2010 1112   GFRAA  Value: >60        The eGFR has been calculated using the MDRD equation. This calculation has not been validated in all clinical situations. eGFR's persistently <60 mL/min signify possible Chronic Kidney Disease. 12/04/2010 1112    Lipid Panel  No results found for this basename: chol, trig,  hdl, cholhdl, vldl, ldlcalc    CBC    Component Value Date/Time   WBC 8.6 10/13/2011 0931   RBC 4.90 10/13/2011 0931   HGB 15.5 10/13/2011 0931   HCT 46.7 10/13/2011 0931   PLT 271.0 10/13/2011 0931   MCV 95.4 10/13/2011 0931   MCH 32.2 12/04/2010 1112   MCHC 33.2 10/13/2011 0931   RDW 14.1 10/13/2011 0931   LYMPHSABS 2.4 10/13/2011 0931   MONOABS 0.5 10/13/2011 0931   EOSABS 0.5 10/13/2011 0931   BASOSABS 0.0 10/13/2011 0931

## 2011-11-26 NOTE — Assessment & Plan Note (Signed)
Stable. Continue current medications. His dyspnea on exertion is multifactorial but mostly from his weight. Discussed this with him today.

## 2011-11-26 NOTE — Assessment & Plan Note (Signed)
Stable. No further workup at this time.

## 2011-12-16 ENCOUNTER — Encounter: Payer: Self-pay | Admitting: Critical Care Medicine

## 2011-12-16 ENCOUNTER — Ambulatory Visit (INDEPENDENT_AMBULATORY_CARE_PROVIDER_SITE_OTHER): Payer: Medicare Other | Admitting: Critical Care Medicine

## 2011-12-16 VITALS — BP 140/76 | HR 76 | Temp 97.5°F | Ht 71.0 in | Wt 302.0 lb

## 2011-12-16 DIAGNOSIS — J449 Chronic obstructive pulmonary disease, unspecified: Secondary | ICD-10-CM

## 2011-12-16 NOTE — Assessment & Plan Note (Signed)
History of asthmatic bronchitis lifelong never smoker however there was significant passive smoke exposure. There has been obstructive airways disease documented previously. This was improved following discontinuance of nonselective beta blocker. The patient does require oxygen therapy at rest and with exertion. The patient has been intolerant of all inhaled medications. The patient's pulmonary edema pattern has improved and with improved diuresis and blood pressure control patient's pulmonary status has improved. Plan No additional bronchodilator therapy indicated Continue oxygen as prescribed No additional medication changes Return 6 months

## 2011-12-16 NOTE — Patient Instructions (Signed)
No change in medications. Return in        6 months        

## 2011-12-16 NOTE — Progress Notes (Signed)
Subjective:    Patient ID: [name], male    DOB: [birth year], 78 y.o.   MRN: [id number]  HPI Comments:    12/16/2011 Pt seen last ov with HTN and pulm edema and diastolic CHF.  We adjusted meds and then cardiology f/u and made further adjustments. Weight is the same at 301.  Since last OV is better.  No real cough.  Better with ambulation.  No real edema. Pt denies any significant sore throat, nasal congestion or excess secretions, fever, chills, sweats, unintended weight loss, pleurtic or exertional chest pain, orthopnea PND, or leg swelling Pt denies any increase in rescue therapy over baseline, denies waking up needing it or having any early am or nocturnal exacerbations of coughing/wheezing/or dyspnea. Pt also denies any obvious fluctuation in symptoms with  weather or environmental change or other alleviating or aggravating factors    Past Medical History  Diagnosis Date  . CAD (coronary artery disease)     remote CABG in 1995 per Dr. Laneta Simmers. Last myoview in 2011 with normal perfusion and normal EF  . HTN (hypertension)   . HLD (hyperlipidemia)   . Type II or unspecified type diabetes mellitus without mention of complication, not stated as uncontrolled   . Obesity   . Syncope   . COPD (chronic obstructive pulmonary disease)   . Hypoxia      No family history on file.   History   Social History  . Marital Status: Married    Spouse Name: N/A    Number of Children: N/A  . Years of Education: N/A   Occupational History  . Retired     English as a second language teacher   Social History Main Topics  . Smoking status: Never Smoker   . Smokeless tobacco: Never Used  . Alcohol Use: No  . Drug Use: No  . Sexually Active: No   Other Topics Concern  . Not on file   Social History Narrative  . No narrative on file     No Known Allergies   Outpatient Prescriptions Prior to Visit  Medication Sig Dispense Refill  . aspirin 325 MG tablet Take 325 mg by mouth daily.         . furosemide (LASIX) 40 MG tablet Take 1 tablet (40 mg total) by mouth daily.  30 tablet  11  . isosorbide mononitrate (IMDUR) 30 MG 24 hr tablet Take 1 tablet (30 mg total) by mouth daily.  30 tablet  11  . LIPITOR 10 MG tablet TAKE ONE TABLET BY MOUTH AT BEDTIME.  30 each  12  . losartan (COZAAR) 100 MG tablet Take 1 tablet (100 mg total) by mouth daily.  30 tablet  6  . Meclizine HCl (BONINE) 25 MG CHEW Chew 1 tablet by mouth 2 (two) times daily.        . Multiple Vitamins-Minerals (MULTIVITAMIN & MINERAL PO) Take 1 tablet by mouth daily.        . Nebivolol HCl (BYSTOLIC) 20 MG TABS Take 1 tablet (20 mg total) by mouth daily.  30 tablet  12  . nitroGLYCERIN (NITROSTAT) 0.4 MG SL tablet Place 0.4 mg under the tongue every 5 (five) minutes as needed.        . hydrALAZINE (APRESOLINE) 25 MG tablet Take 1 tablet (25 mg total) by mouth 3 (three) times daily.  90 tablet  6  . potassium chloride SA (K-DUR,KLOR-CON) 20 MEQ tablet Take 1 tablet (20 mEq total) by mouth daily.  30 tablet  6       Review of Systems    Constitutional:   No  weight loss, night sweats,  Fevers, chills, fatigue, lassitude. HEENT:   No headaches,  Difficulty swallowing,  Tooth/dental problems,  Sore throat,                No sneezing, itching, ear ache, nasal congestion, post nasal drip,   CV:  Notes chest pain,  +Orthopnea,   PND,  No anasarca, dizziness, palpitations  +LE edema  GI  No heartburn, indigestion, abdominal pain, nausea, vomiting, diarrhea, change in bowel habits, loss of appetite  Resp: Notes shortness of breath with exertion not at rest.  No excess mucus, no productive cough,  No non-productive cough,  No coughing up of blood.  No change in color of mucus.  No wheezing.  No chest wall deformity Neg snoring or hypersolomence  Skin: no rash or lesions.  GU: no dysuria, change in color of urine, no urgency or frequency.  No flank pain.  MS:  No joint pain or swelling.  No decreased range of  motion.  No back pain.  Psych:  No change in mood or affect. No depression or anxiety.  No memory loss.  Objective:   Physical Exam BP 140/76  Pulse 76  Temp(Src) 97.5 F (36.4 C) (Oral)  Ht 5\' 11"  (1.803 m)  Wt 302 lb (136.986 kg)  BMI 42.12 kg/m2  SpO2 92%  Gen:  obese , in no distress, anxious  ENT: No lesions,  mouth clear,  oropharynx clear, no postnasal drip, class 2 airway   Neck: no JVD, no TMG, no carotid bruits  Lungs: No use of accessory muscles, no dullness to percussion, clear at  bases   Cardiovascular: RRR, heart sounds normal, no murmur or gallops, no  peripheral edema  Abdomen: soft and NT, no HSM,  BS normal, obese   Musculoskeletal: No deformities, no cyanosis or clubbing  Neuro: alert, non focal  Skin: Warm, no lesions or rashes          Assessment & Plan:    Obstructive chronic bronchitis without exacerbation History of asthmatic bronchitis lifelong never smoker however there was significant passive smoke exposure. There has been obstructive airways disease documented previously. This was improved following discontinuance of nonselective beta blocker. The patient does require oxygen therapy at rest and with exertion. The patient has been intolerant of all inhaled medications. The patient's pulmonary edema pattern has improved and with improved diuresis and blood pressure control patient's pulmonary status has improved. Plan No additional bronchodilator therapy indicated Continue oxygen as prescribed No additional medication changes Return 6 months     Updated Medication List Outpatient Encounter Prescriptions as of 12/16/2011  Medication Sig Dispense Refill  . aspirin 325 MG tablet Take 325 mg by mouth daily.        . furosemide (LASIX) 40 MG tablet Take 1 tablet (40 mg total) by mouth daily.  30 tablet  11  . hydrALAZINE (APRESOLINE) 25 MG tablet Take 25 mg by mouth 2 (two) times daily.      . isosorbide mononitrate (IMDUR) 30 MG 24 hr  tablet Take 1 tablet (30 mg total) by mouth daily.  30 tablet  11  . LIPITOR 10 MG tablet TAKE ONE TABLET BY MOUTH AT BEDTIME.  30 each  12  . losartan (COZAAR) 100 MG tablet Take 1 tablet (100 mg total) by mouth daily.  30 tablet  6  . Meclizine HCl (BONINE) 25 MG  CHEW Chew 1 tablet by mouth 2 (two) times daily.        . Multiple Vitamins-Minerals (MULTIVITAMIN & MINERAL PO) Take 1 tablet by mouth daily.        . Nebivolol HCl (BYSTOLIC) 20 MG TABS Take 1 tablet (20 mg total) by mouth daily.  30 tablet  12  . nitroGLYCERIN (NITROSTAT) 0.4 MG SL tablet Place 0.4 mg under the tongue every 5 (five) minutes as needed.        Marland Kitchen DISCONTD: hydrALAZINE (APRESOLINE) 25 MG tablet Take 1 tablet (25 mg total) by mouth 3 (three) times daily.  90 tablet  6  . potassium chloride SA (K-DUR,KLOR-CON) 20 MEQ tablet Take 1 tablet (20 mEq total) by mouth daily.  30 tablet  6

## 2011-12-18 HISTORY — PX: CORONARY ANGIOPLASTY WITH STENT PLACEMENT: SHX49

## 2012-01-06 ENCOUNTER — Encounter (HOSPITAL_COMMUNITY): Payer: Self-pay

## 2012-01-06 ENCOUNTER — Emergency Department (HOSPITAL_COMMUNITY): Payer: Medicare Other

## 2012-01-06 ENCOUNTER — Inpatient Hospital Stay (HOSPITAL_COMMUNITY)
Admission: EM | Admit: 2012-01-06 | Discharge: 2012-01-11 | DRG: 246 | Disposition: A | Discharge status: Home / Self Care | Payer: Medicare Other | Attending: Internal Medicine | Admitting: Internal Medicine

## 2012-01-06 DIAGNOSIS — R079 Chest pain, unspecified: Secondary | ICD-10-CM

## 2012-01-06 DIAGNOSIS — E119 Type 2 diabetes mellitus without complications: Secondary | ICD-10-CM

## 2012-01-06 DIAGNOSIS — I2510 Atherosclerotic heart disease of native coronary artery without angina pectoris: Secondary | ICD-10-CM

## 2012-01-06 DIAGNOSIS — Z9981 Dependence on supplemental oxygen: Secondary | ICD-10-CM

## 2012-01-06 DIAGNOSIS — Z7982 Long term (current) use of aspirin: Secondary | ICD-10-CM

## 2012-01-06 DIAGNOSIS — Z951 Presence of aortocoronary bypass graft: Secondary | ICD-10-CM

## 2012-01-06 DIAGNOSIS — I252 Old myocardial infarction: Secondary | ICD-10-CM

## 2012-01-06 DIAGNOSIS — I10 Essential (primary) hypertension: Secondary | ICD-10-CM | POA: Diagnosis present

## 2012-01-06 DIAGNOSIS — I5033 Acute on chronic diastolic (congestive) heart failure: Secondary | ICD-10-CM

## 2012-01-06 DIAGNOSIS — E1129 Type 2 diabetes mellitus with other diabetic kidney complication: Secondary | ICD-10-CM

## 2012-01-06 DIAGNOSIS — I129 Hypertensive chronic kidney disease with stage 1 through stage 4 chronic kidney disease, or unspecified chronic kidney disease: Secondary | ICD-10-CM | POA: Diagnosis present

## 2012-01-06 DIAGNOSIS — E782 Mixed hyperlipidemia: Secondary | ICD-10-CM | POA: Diagnosis present

## 2012-01-06 DIAGNOSIS — I214 Non-ST elevation (NSTEMI) myocardial infarction: Principal | ICD-10-CM

## 2012-01-06 DIAGNOSIS — Z6841 Body Mass Index (BMI) 40.0 and over, adult: Secondary | ICD-10-CM

## 2012-01-06 DIAGNOSIS — I25810 Atherosclerosis of coronary artery bypass graft(s) without angina pectoris: Secondary | ICD-10-CM

## 2012-01-06 DIAGNOSIS — J449 Chronic obstructive pulmonary disease, unspecified: Secondary | ICD-10-CM | POA: Diagnosis present

## 2012-01-06 DIAGNOSIS — J4489 Other specified chronic obstructive pulmonary disease: Secondary | ICD-10-CM

## 2012-01-06 DIAGNOSIS — I509 Heart failure, unspecified: Secondary | ICD-10-CM | POA: Diagnosis present

## 2012-01-06 DIAGNOSIS — N189 Chronic kidney disease, unspecified: Secondary | ICD-10-CM | POA: Diagnosis present

## 2012-01-06 DIAGNOSIS — I5032 Chronic diastolic (congestive) heart failure: Secondary | ICD-10-CM

## 2012-01-06 HISTORY — DX: Sleep apnea, unspecified: G47.30

## 2012-01-06 HISTORY — DX: Unspecified asthma, uncomplicated: J45.909

## 2012-01-06 HISTORY — DX: Unspecified osteoarthritis, unspecified site: M19.90

## 2012-01-06 HISTORY — DX: Unspecified hearing loss, unspecified ear: H91.90

## 2012-01-06 LAB — CBC
HCT: 42.4 % (ref 39.0–52.0)
Hemoglobin: 14 g/dL (ref 13.0–17.0)
MCH: 31.3 pg (ref 26.0–34.0)
MCHC: 33 g/dL (ref 30.0–36.0)
MCV: 94.9 fL (ref 78.0–100.0)
Platelets: 168 K/uL (ref 150–400)
RBC: 4.47 MIL/uL (ref 4.22–5.81)
RDW: 13.1 % (ref 11.5–15.5)
WBC: 7.9 K/uL (ref 4.0–10.5)

## 2012-01-06 LAB — COMPREHENSIVE METABOLIC PANEL WITH GFR
ALT: 18 U/L (ref 0–53)
AST: 21 U/L (ref 0–37)
Alkaline Phosphatase: 65 U/L (ref 39–117)
CO2: 34 meq/L — ABNORMAL HIGH (ref 19–32)
Calcium: 9.4 mg/dL (ref 8.4–10.5)
Chloride: 101 meq/L (ref 96–112)
GFR calc Af Amer: 58 mL/min — ABNORMAL LOW (ref >90)
GFR calc non Af Amer: 50 mL/min — ABNORMAL LOW (ref >90)
Glucose, Bld: 109 mg/dL — ABNORMAL HIGH (ref 70–99)
Potassium: 4.4 meq/L (ref 3.5–5.1)
Sodium: 141 meq/L (ref 135–145)

## 2012-01-06 LAB — CARDIAC PANEL(CRET KIN+CKTOT+MB+TROPI)
CK, MB: 52 ng/mL (ref 0.3–4.0)
Relative Index: 8.1 — ABNORMAL HIGH (ref 0.0–2.5)
Troponin I: 6.65 ng/mL (ref <0.30)

## 2012-01-06 LAB — DIFFERENTIAL
Basophils Absolute: 0 10*3/uL (ref 0.0–0.1)
Basophils Relative: 0 % (ref 0–1)
Eosinophils Absolute: 0.1 10*3/uL (ref 0.0–0.7)
Eosinophils Relative: 2 % (ref 0–5)
Lymphocytes Relative: 24 % (ref 12–46)
Lymphs Abs: 1.9 K/uL (ref 0.7–4.0)
Monocytes Absolute: 0.7 10*3/uL (ref 0.1–1.0)
Monocytes Relative: 8 % (ref 3–12)
Neutro Abs: 5.2 K/uL (ref 1.7–7.7)
Neutrophils Relative %: 66 % (ref 43–77)

## 2012-01-06 LAB — APTT: aPTT: 28 s (ref 24–37)

## 2012-01-06 LAB — COMPREHENSIVE METABOLIC PANEL
Albumin: 3.6 g/dL (ref 3.5–5.2)
BUN: 18 mg/dL (ref 6–23)
Creatinine, Ser: 1.32 mg/dL (ref 0.50–1.35)
Total Bilirubin: 0.4 mg/dL (ref 0.3–1.2)
Total Protein: 7.1 g/dL (ref 6.0–8.3)

## 2012-01-06 LAB — PROTIME-INR
INR: 1.01 (ref 0.00–1.49)
Prothrombin Time: 13.5 seconds (ref 11.6–15.2)

## 2012-01-06 LAB — TROPONIN I: Troponin I: <0.3 ng/mL (ref <0.30)

## 2012-01-06 MED ORDER — MORPHINE SULFATE 4 MG/ML IJ SOLN
4.0000 mg | Freq: Once | INTRAMUSCULAR | Status: AC
  Administered 2012-01-06: 4 mg via INTRAVENOUS
  Filled 2012-01-06: qty 1

## 2012-01-06 MED ORDER — SODIUM CHLORIDE 0.9 % IV SOLN
250.0000 mL | INTRAVENOUS | Status: DC | PRN
  Administered 2012-01-06: 250 mL via INTRAVENOUS

## 2012-01-06 MED ORDER — MORPHINE SULFATE 4 MG/ML IJ SOLN
4.0000 mg | INTRAMUSCULAR | Status: DC | PRN
  Administered 2012-01-06: 4 mg via INTRAVENOUS
  Filled 2012-01-06: qty 1

## 2012-01-06 MED ORDER — NITROGLYCERIN IN D5W 200-5 MCG/ML-% IV SOLN
2.0000 ug/min | INTRAVENOUS | Status: DC
  Administered 2012-01-07: 35 ug/min via INTRAVENOUS
  Administered 2012-01-07: 5 ug/min via INTRAVENOUS
  Filled 2012-01-06: qty 250

## 2012-01-06 MED ORDER — FUROSEMIDE 40 MG PO TABS
40.0000 mg | ORAL_TABLET | Freq: Every day | ORAL | Status: DC
  Administered 2012-01-06: 40 mg via ORAL
  Filled 2012-01-06 (×2): qty 1

## 2012-01-06 MED ORDER — ALPRAZOLAM 0.25 MG PO TABS: 0.2500 mg | ORAL_TABLET | Freq: Two times a day (BID) | ORAL | Status: DC | PRN

## 2012-01-06 MED ORDER — MORPHINE SULFATE 4 MG/ML IJ SOLN: 3.0000 mg | INTRAMUSCULAR | Status: DC | PRN

## 2012-01-06 MED ORDER — ASPIRIN 325 MG PO TABS
325.0000 mg | ORAL_TABLET | Freq: Every day | ORAL | Status: DC
  Administered 2012-01-08 – 2012-01-09 (×2): 325 mg via ORAL
  Filled 2012-01-06 (×3): qty 1

## 2012-01-06 MED ORDER — ZOLPIDEM TARTRATE 5 MG PO TABS: 5.0000 mg | ORAL_TABLET | Freq: Every evening | ORAL | Status: DC | PRN

## 2012-01-06 MED ORDER — LOSARTAN POTASSIUM 50 MG PO TABS
100.0000 mg | ORAL_TABLET | Freq: Every day | ORAL | Status: DC
  Administered 2012-01-07 – 2012-01-11 (×5): 100 mg via ORAL
  Filled 2012-01-06 (×5): qty 2

## 2012-01-06 MED ORDER — ATORVASTATIN CALCIUM 10 MG PO TABS
10.0000 mg | ORAL_TABLET | Freq: Every day | ORAL | Status: DC
  Administered 2012-01-06 – 2012-01-07 (×2): 10 mg via ORAL
  Filled 2012-01-06 (×3): qty 1

## 2012-01-06 MED ORDER — HYDRALAZINE HCL 25 MG PO TABS
25.0000 mg | ORAL_TABLET | Freq: Two times a day (BID) | ORAL | Status: DC
  Administered 2012-01-06 – 2012-01-11 (×10): 25 mg via ORAL
  Filled 2012-01-06 (×11): qty 1

## 2012-01-06 MED ORDER — CLOPIDOGREL BISULFATE 75 MG PO TABS
75.0000 mg | ORAL_TABLET | Freq: Every day | ORAL | Status: DC
  Administered 2012-01-07 – 2012-01-11 (×5): 75 mg via ORAL
  Filled 2012-01-06 (×6): qty 1

## 2012-01-06 MED ORDER — HEPARIN (PORCINE) IN NACL 100-0.45 UNIT/ML-% IJ SOLN
1400.0000 [IU]/h | INTRAMUSCULAR | Status: DC
  Administered 2012-01-06: 1250 [IU]/h via INTRAVENOUS
  Filled 2012-01-06 (×3): qty 250

## 2012-01-06 MED ORDER — SODIUM CHLORIDE 0.9 % IJ SOLN
3.0000 mL | Freq: Two times a day (BID) | INTRAMUSCULAR | Status: DC
  Administered 2012-01-07 – 2012-01-11 (×8): 3 mL via INTRAVENOUS

## 2012-01-06 MED ORDER — ALBUTEROL SULFATE HFA 108 (90 BASE) MCG/ACT IN AERS
2.0000 | INHALATION_SPRAY | RESPIRATORY_TRACT | Status: DC | PRN
  Filled 2012-01-06: qty 6.7

## 2012-01-06 MED ORDER — ONDANSETRON HCL 4 MG/2ML IJ SOLN
4.0000 mg | Freq: Four times a day (QID) | INTRAMUSCULAR | Status: DC | PRN
  Administered 2012-01-06: 4 mg via INTRAVENOUS
  Filled 2012-01-06: qty 2

## 2012-01-06 MED ORDER — POTASSIUM CHLORIDE CRYS ER 20 MEQ PO TBCR
20.0000 meq | EXTENDED_RELEASE_TABLET | Freq: Every day | ORAL | Status: DC
  Administered 2012-01-07 – 2012-01-09 (×3): 20 meq via ORAL
  Filled 2012-01-06 (×3): qty 1

## 2012-01-06 MED ORDER — CLOPIDOGREL BISULFATE 300 MG PO TABS
300.0000 mg | ORAL_TABLET | Freq: Once | ORAL | Status: AC
  Administered 2012-01-06: 300 mg via ORAL
  Filled 2012-01-06: qty 1

## 2012-01-06 MED ORDER — ISOSORBIDE MONONITRATE ER 30 MG PO TB24
30.0000 mg | ORAL_TABLET | Freq: Every day | ORAL | Status: DC
  Administered 2012-01-07 – 2012-01-10 (×4): 30 mg via ORAL
  Filled 2012-01-06 (×6): qty 1

## 2012-01-06 MED ORDER — NITROGLYCERIN 0.4 MG SL SUBL: 0.4000 mg | SUBLINGUAL_TABLET | SUBLINGUAL | Status: DC | PRN

## 2012-01-06 MED ORDER — HEPARIN BOLUS VIA INFUSION
4000.0000 [IU] | Freq: Once | INTRAVENOUS | Status: AC
  Administered 2012-01-06: 4000 [IU] via INTRAVENOUS

## 2012-01-06 MED ORDER — SODIUM CHLORIDE 0.9 % IJ SOLN: 3.0000 mL | INTRAMUSCULAR | Status: DC | PRN

## 2012-01-06 MED ORDER — NEBIVOLOL HCL 20 MG PO TABS
20.0000 mg | ORAL_TABLET | Freq: Every day | ORAL | Status: DC
  Administered 2012-01-07 – 2012-01-11 (×5): 20 mg via ORAL
  Filled 2012-01-06 (×5): qty 1

## 2012-01-06 MED ORDER — ACETAMINOPHEN 325 MG PO TABS: 650.0000 mg | ORAL_TABLET | ORAL | Status: DC | PRN

## 2012-01-06 MED ORDER — NITROGLYCERIN IN D5W 200-5 MCG/ML-% IV SOLN
2.0000 ug/min | Freq: Once | INTRAVENOUS | Status: AC
  Administered 2012-01-06: 10 ug/min via INTRAVENOUS
  Filled 2012-01-06: qty 250

## 2012-01-06 NOTE — Consult Note (Signed)
As above Sherryl Manges, MD 01/06/2012 9:09 PM   Pt s troponin 0.81  Pt much more comfortable w barely residual discomfort  Will begin on plavix following discussion wth dr Zara Chess and proceed with cath is am Sherryl Manges, MD 01/06/2012 9:10 PM'

## 2012-01-06 NOTE — Consult Note (Signed)
ANTICOAGULATION CONSULT NOTE - Initial Consult  Pharmacy Consult for Heparin Indication: chest pain/ACS  No Known Allergies  Patient Measurements: Height: 5\' 11"  (180.3 cm) Weight: 304 lb (137.893 kg) IBW/kg (Calculated) : 75.3  Heparin Adjusted Dosing Wt: 107.3 kg  Vital Signs: Temp: 98.3 F (36.8 C) (06/20 1304) Temp src: Oral (06/20 1304) BP: 124/59 mmHg (06/20 1530) Pulse Rate: 60  (06/20 1530)  Labs:  Basename 01/06/12 1336  HGB 14.0  HCT 42.4  PLT 168  APTT 28  LABPROT 13.5  INR 1.01  HEPARINUNFRC --  CREATININE 1.32  CKTOTAL --  CKMB --  TROPONINI <0.30   Estimated Creatinine Clearance: 64.4 ml/min (by C-G formula based on Cr of 1.32).  Medical / Surgical History: Past Medical History  Diagnosis Date  . CAD (coronary artery disease)     remote CABG in 1995 per Dr. Laneta Simmers. Last myoview in 2011 with normal perfusion and normal EF  . HTN (hypertension)   . HLD (hyperlipidemia)   . Obesity   . Syncope   . COPD (chronic obstructive pulmonary disease)   . Hypoxia   . Anginal pain   . Pneumonia     "once"  . HOH (hard of hearing)     left ear  . Asthma   . Shortness of breath     "can't lay down flat at all"  . Sleep apnea   . Arthritis     "all over"   Past Surgical History  Procedure Date  . Functional endoscopic sinus surgery 2006  . Hydrocele excision 2005  . Prostate surgery 1998  . Inguinal hernia repair 2006    right  . Coronary artery bypass graft 1995    CABG X3  . Excisional hemorrhoidectomy     "twice cut them out; burnt them out once"    Medications: Home Meds include:  ASA, Lipitor, Lasix, Apresoline, Imdur, Losartan, Meclizine, Bystolic, K+   Scheduled:    . heparin  4,000 Units Intravenous Once  .  morphine injection  4 mg Intravenous Once  .  morphine injection  4 mg Intravenous Once  . nitroGLYCERIN  2-200 mcg/min Intravenous Once   Infusions:    . heparin      Assessment:  [name] is a 78yo male with PMHx  significant for CAD (CABG in 1995; Lexiscan Myoview normal; LVEF 60% 03/13), COPD (with hypoxia, O2 dependent), type 2 DM, HTN, HL and morbid obesity who presents to [facility] ED today with c/o chest pain and shortness of breath.   Heparin per protocol to be initiated.  If chest pain does not resolve, expect repeat cath.  Goal of Therapy:   Heparin level 0.3-0.7 units/ml   Plan:   Heparin 4000 units load.  Begin Heparin at 1250 units/hr.  Will check Heparin level, CBC in 8 hours.  Daily Heparin level and CBC while on Heparin.  [name], [name], Pharm.D.   01/06/2012 5:43 PM

## 2012-01-06 NOTE — ED Provider Notes (Signed)
History     CSN: [id number]  Arrival date & time 01/06/12  1243   First MD Initiated Contact with Patient 01/06/12 1308      Chief Complaint  Patient presents with  . Chest Pain    (Consider location/radiation/quality/duration/timing/severity/associated sxs/prior treatment) HPI Comments: Pt woke this AM and had very mild CP, no sweats, SOB.  He was trying to make breakfast for family member and pain worsened.  He has h/o reflux, but this feels very different, also has h/o CABG many years ago, but doesn't typically have CP.  Has never had to use NTG until today.  Used 2 at home, also with EMS with no sig relief.  Did take 4 baby aspirin.  Pt denies coughing, although does have baseline allergies.  No fevers, chills.  No abd pain, no N/V/D.  Feels SOB currently although O2 has helped.  No radiation of pain.    Patient is a 78 y.o. male presenting with chest pain. The history is provided by the patient.  Chest Pain The chest pain began 3 - 5 hours ago. Primary symptoms include shortness of breath. Pertinent negatives for primary symptoms include no fever, no cough, no abdominal pain, no nausea and no vomiting.     Past Medical History  Diagnosis Date  . CAD (coronary artery disease)     remote CABG in 1995 per Dr. Laneta Simmers. Last myoview in 2011 with normal perfusion and normal EF  . HTN (hypertension)   . HLD (hyperlipidemia)   . Type II or unspecified type diabetes mellitus without mention of complication, not stated as uncontrolled   . Obesity   . Syncope   . COPD (chronic obstructive pulmonary disease)   . Hypoxia     Past Surgical History  Procedure Date  . Coronary artery bypass graft 1995  . Functional endoscopic sinus surgery 2006  . Inguinal hernia repair 2006  . Hydrocele excision 2005  . Prostate surgery 1998    History reviewed. No pertinent family history.  History  Substance Use Topics  . Smoking status: Never Smoker   . Smokeless tobacco: Never Used  .  Alcohol Use: No      Review of Systems  Constitutional: Negative for fever and chills.  Respiratory: Positive for shortness of breath. Negative for cough.   Cardiovascular: Positive for chest pain.  Gastrointestinal: Negative for nausea, vomiting and abdominal pain.  Musculoskeletal: Negative for back pain.  All other systems reviewed and are negative.    Allergies  Review of patient's allergies indicates no known allergies.  Home Medications   Current Outpatient Rx  Name Route Sig Dispense Refill  . ARTIFICIAL TEAR OP Both Eyes Place 2 drops into both eyes daily as needed. For dry eyes    . ASPIRIN 325 MG PO TABS Oral Take 325 mg by mouth daily.      . ATORVASTATIN CALCIUM 10 MG PO TABS Oral Take 10 mg by mouth at bedtime.    . FUROSEMIDE 40 MG PO TABS Oral Take 40 mg by mouth daily.    Marland Kitchen HYDRALAZINE HCL 25 MG PO TABS Oral Take 25 mg by mouth 2 (two) times daily.    . ISOSORBIDE MONONITRATE ER 30 MG PO TB24 Oral Take 30 mg by mouth daily.    Marland Kitchen LOSARTAN POTASSIUM 100 MG PO TABS Oral Take 100 mg by mouth daily.    . MECLIZINE HCL 25 MG PO CHEW Oral Chew 1 tablet by mouth 2 (two) times daily.      Marland Kitchen  MULTIVITAMIN & MINERAL PO Oral Take 1 tablet by mouth daily.      . NEBIVOLOL HCL 20 MG PO TABS Oral Take 20 mg by mouth daily.    Marland Kitchen NITROGLYCERIN 0.4 MG SL SUBL Sublingual Place 0.4 mg under the tongue every 5 (five) minutes as needed.      Marland Kitchen POTASSIUM CHLORIDE CRYS ER 20 MEQ PO TBCR Oral Take 20 mEq by mouth daily.      BP 127/75  Pulse 59  Temp 98.3 F (36.8 C) (Oral)  Resp 7  SpO2 99%  Physical Exam  Nursing note and vitals reviewed. Constitutional: He appears well-developed and well-nourished. No distress.  HENT:  Head: Normocephalic and atraumatic.  Eyes: EOM are normal. Pupils are equal, round, and reactive to light.  Neck: Normal range of motion. Neck supple.  Cardiovascular: Normal rate.   No murmur heard. Pulmonary/Chest: Effort normal. No respiratory distress.  He has no wheezes.  Abdominal: Soft. Bowel sounds are normal. He exhibits no distension. There is no tenderness. There is no rebound and no guarding.  Musculoskeletal: He exhibits no edema and no tenderness.  Neurological: He is alert.  Skin: Skin is warm and dry. No rash noted. He is not diaphoretic.  Psychiatric: He has a normal mood and affect.    ED Course  Procedures (including critical care time)  CRITICAL CARE Performed by: Lear Ng.   Total critical care time: 30 min  Critical care time was exclusive of separately billable procedures and treating other patients.  Critical care was necessary to treat or prevent imminent or life-threatening deterioration.  Critical care was time spent personally by me on the following activities: development of treatment plan with patient and/or surrogate as well as nursing, discussions with consultants, evaluation of patient's response to treatment, examination of patient, obtaining history from patient or surrogate, ordering and performing treatments and interventions, ordering and review of laboratory studies, ordering and review of radiographic studies, pulse oximetry and re-evaluation of patient's condition.   Labs Reviewed  COMPREHENSIVE METABOLIC PANEL - Abnormal; Notable for the following:    CO2 34 (*)     Glucose, Bld 109 (*)     GFR calc non Af Amer 50 (*)     GFR calc Af Amer 58 (*)     All other components within normal limits  CBC  DIFFERENTIAL  TROPONIN I  PROTIME-INR  APTT   Dg Chest Port 1 View  01/06/2012  *RADIOLOGY REPORT*  Clinical Data: Chest pain.  Numbness.  Short of breath.  PORTABLE CHEST - 1 VIEW  Comparison: 10/11/2011.  Findings: Apical lordotic projection. Cardiomegaly.  CABG.  Low volume chest with basilar atelectasis.  No gross opacification.  IMPRESSION: Low volume chest and cardiomegaly.  No gross evidence of failure.  Original Report Authenticated By: Andreas Newport, M.D.     1. Chest pain      RA sat is 96% and normal by my interpretation  ECG at time 13:01 shows SR at rate 60, first degree AV block, normal axis, poor r wave progression, no ST or T wave abn's.  Q waves in lead III and AVF seen previously and similar to ECG from 06/20/07.  First degree block is new however.     3:00 PM Spoke to [facility] cardiology who will see pt in the ED.  Pain down to 4/10 from 8/10.  Will give another dose.  NTG is on board, BP is stabilizing.  Troponin is negative.    MDM  Pt  with new severe chest pressure pain and SOB, h/o CABG.  Concern for UA.  ECG shows no definitive acute ischemia.  Labs, pain control, NTG drip here.  Will continue to monitor.  No symptoms that suggest PE or PTX.          Gavin Pound. Mayetta Castleman, MD 01/06/12 1525

## 2012-01-06 NOTE — Consult Note (Signed)
Cardiology Admission Note   Patient ID: [name] MRN: [id number], DOB/AGE: 76-May-1932   Admit date: 01/06/2012 Date of Consult: 01/06/2012  Primary Physician: Daisy Floro, MD Primary Cardiologist: Juanito Doom, MD  Pt. Profile: [name] is a 78yo male with PMHx significant for CAD (CABG in 1995; Lexiscan Myoview normal; LVEF 60% 03/13), COPD (with hypoxia, O2 dependent), type 2 DM, HTN, HL and morbid obesity who presents to [facility] ED today with c/o chest pain and shortness of breath.   Of note, the patient has reported diastolic dysfunction. Echo in 04/13 revealed LVEF 55-60%, no mentioned diastolic dysfunction. Moderate LA dilatation was noted.   Reason for consult: evaluation/management of chest pain  Problem List: Past Medical History  Diagnosis Date  . CAD (coronary artery disease)     remote CABG in 1995 per Dr. Laneta Simmers. Last myoview in 2011 with normal perfusion and normal EF  . HTN (hypertension)   . HLD (hyperlipidemia)   . Type II or unspecified type diabetes mellitus without mention of complication, not stated as uncontrolled   . Obesity   . Syncope   . COPD (chronic obstructive pulmonary disease)   . Hypoxia     Past Surgical History  Procedure Date  . Coronary artery bypass graft 1995  . Functional endoscopic sinus surgery 2006  . Inguinal hernia repair 2006  . Hydrocele excision 2005  . Prostate surgery 1998     Allergies: No Known Allergies  HPI:   The patient has been seen multiple times by Dr. Delford Field with pulmonology since 2012. He was initially seen for syncope and collapse. He was found to be hypoxemic, and diagnosed with COPD attributed to secondary tobacco exposure and chronic obstructive asthma. On follow-up in 03/13, he was noted to have evidence of volume excess on exam attributed to acute on chronic diastolic CHF (evidenced on prior echocardiograms). Additionally, he complained of vague, nonspecific chest pain. He was started on  ARB/BB/Hydralazine/Lasix with KCl.   He was seen in our clinic shortly after with similar complaints. He was started on Imdur and set up for [facility] which revealed no evidence of ischemia and preserved LV function. A 2D echo was ordered returned the above findings. He recently followed up with Dr. Daleen Squibb in the office on 11/26/11. The patient still complained of DOE, but denied chest pain. Given his normal Lexiscan Myoview results, no further cardiac ischemia work-up was pursued. The patient's DOE was largely attributed to his underlying pulmonary disease and COPD.   He reports experiencing constant SSCP beginning around 8AM with associated shortness of breath 9/10. No radiation. No association with meals and different from reflux symptoms. He had removed his oxygen in order to prepare breakfast for his wife. He sat to rest, and placed his oxygen back on. His chest pain persisted. He took NTG SL x 2 with some relief rated at a 6/10. He attempted to go to the drug store, but the pain and shortness of breath persisted. He reports associated diaphoresis, lightheadedness. He endorses baseline PND, orthopnea. Denies n/v, fevers, chills, cough and sick contacts. He reports baseline LE edema. No unilateral leg swelling, tenderness. EMS was called at the request of the patient and he was transported to [facility] ED. Low-dose ASA x 4 given.   Upon ED arrival, EKG reveals unchanged anterior and inferior Q waves, LVH, otherwise no acute changes. Initial troponin-I WNL. CBC unremarkable. BMET with mildly elevated CO2. CXR revealed cardiomegaly, low volumes and no gross evidence of CHF.  Home Medications: Prior to Admission medications   Medication Sig Start Date End Date Taking? Authorizing Provider  ARTIFICIAL TEAR OP Place 2 drops into both eyes daily as needed. For dry eyes   Yes Historical Provider, MD  aspirin 325 MG tablet Take 325 mg by mouth daily.     Yes Historical Provider, MD  atorvastatin (LIPITOR) 10  MG tablet Take 10 mg by mouth at bedtime.   Yes Historical Provider, MD  furosemide (LASIX) 40 MG tablet Take 40 mg by mouth daily.   Yes Historical Provider, MD  hydrALAZINE (APRESOLINE) 25 MG tablet Take 25 mg by mouth 2 (two) times daily. 10/11/11 10/10/12 Yes Storm Frisk, MD  isosorbide mononitrate (IMDUR) 30 MG 24 hr tablet Take 30 mg by mouth daily.   Yes Historical Provider, MD  losartan (COZAAR) 100 MG tablet Take 100 mg by mouth daily.   Yes Historical Provider, MD  Meclizine HCl (BONINE) 25 MG CHEW Chew 1 tablet by mouth 2 (two) times daily.     Yes Historical Provider, MD  Multiple Vitamins-Minerals (MULTIVITAMIN & MINERAL PO) Take 1 tablet by mouth daily.     Yes Historical Provider, MD  Nebivolol HCl (BYSTOLIC) 20 MG TABS Take 20 mg by mouth daily.   Yes Historical Provider, MD  nitroGLYCERIN (NITROSTAT) 0.4 MG SL tablet Place 0.4 mg under the tongue every 5 (five) minutes as needed.     Yes Historical Provider, MD  potassium chloride SA (K-DUR,KLOR-CON) 20 MEQ tablet Take 20 mEq by mouth daily.   Yes Historical Provider, MD    Inpatient Medications:     .  morphine injection  4 mg Intravenous Once  .  morphine injection  4 mg Intravenous Once  . nitroGLYCERIN  2-200 mcg/min Intravenous Once    (Not in a hospital admission)  History reviewed. No pertinent family history.   History   Social History  . Marital Status: Married    Spouse Name: N/A    Number of Children: N/A  . Years of Education: N/A   Occupational History  . Retired     English as a second language teacher   Social History Main Topics  . Smoking status: Never Smoker   . Smokeless tobacco: Never Used  . Alcohol Use: No  . Drug Use: No  . Sexually Active: No   Other Topics Concern  . Not on file   Social History Narrative  . No narrative on file     Review of Systems: General: negative for chills, fever, night sweats or weight changes.  Cardiovascular: positive for chest pain, sob,  lightheadedness, PND, diaphoresis, edema, negative for dyspnea on exertion, orthopnea, palpitations Dermatological: positive for RLE rash Respiratory: negative for cough or wheezing Urologic: negative for hematuria Abdominal: negative for nausea for vomiting, diarrhea, bright red blood per rectum, melena, or hematemesis Neurologic: negative for visual changes, syncope All other systems reviewed and are otherwise negative except as noted above.  Physical Exam: Blood pressure 127/75, pulse 59, temperature 98.3 F (36.8 C), temperature source Oral, resp. rate 7, SpO2 99.00%.   General: Obese, well developed, in no acute distress. Head: Normocephalic, atraumatic, sclera non-icteric, no xanthomas, nares are without discharge Neck: Negative for carotid bruits. JVD not elevated. Lungs: Distant breath sounds. No wheezes, rales or rhonchi appreciated. Breathing unlabored.  Heart: RRR with S1 S2. No murmurs, rubs, or gallops appreciated. Abdomen: Soft, non-tender, distention of central obesity noted, normoactive bowel sounds. No hepatomegaly. No rebound/guarding. No obvious abdominal masses. Msk:  Strength and  tone appears normal for age. Extremities: Trace bilateral edema. Tortuous LLE vein vs palpable cord. Non-tender. Some appreciable bilateral LE edema. No clubbing or cyanosis.  Distal pedal pulses are 2+ and equal bilaterally. Neuro: Alert and oriented X 3. Moves all extremities spontaneously. Psych:  Responds to questions appropriately with a normal affect.  Labs: Recent Labs  Basename 01/06/12 1336   WBC 7.9   HGB 14.0   HCT 42.4   MCV 94.9   PLT 168   Lab 01/06/12 1336  NA 141  K 4.4  CL 101  CO2 34*  BUN 18  CREATININE 1.32  CALCIUM 9.4  PROT 7.1  BILITOT 0.4  ALKPHOS 65  ALT 18  AST 21  AMYLASE --  LIPASE --  GLUCOSE 109*   Recent Labs  Basename 01/06/12 1336   CKTOTAL --   CKMB --   CKMBINDEX --   TROPONINI <0.30   Radiology/Studies: Dg Chest Port 1  View  01/06/2012  *RADIOLOGY REPORT*  Clinical Data: Chest pain.  Numbness.  Short of breath.  PORTABLE CHEST - 1 VIEW  Comparison: 10/11/2011.  Findings: Apical lordotic projection. Cardiomegaly.  CABG.  Low volume chest with basilar atelectasis.  No gross opacification.  IMPRESSION: Low volume chest and cardiomegaly.  No gross evidence of failure.  Original Report Authenticated By: Andreas Newport, M.D.    EKG: NSR, 60 bpm, 1st degree AVB, Q waves vs poor R wave progression V1-V4, III, aVF, LVH, LAE; unchanged from prior tracings  ASSESSMENT AND PLAN:   1. CAD/chest pain- patient with remote CABG in 1995; cardiac risk factors noted above. Recently seen in the clinic with normal Lexiscan Myoview findings. Patient has significant COPD and is oxygen-dependent. He has endorsed prior intermittent episodes of chest pain, however this was more severe and constant. Improved by NTG SL and gtt. Not reminiscent of reflux discomfort. Denied symptoms concerning for DVT, however patient is fairly immobile, obese with venous insufficiency (tortuous vein LLE) vs palpable cord. Non-tender. Lungs clear. Bilateral trace pretibial edema noted. EKG with acute ischemic changes. Consistent with prior tracings. Troponin-I negative x 1. CXR without evidence of pulmonary edema or acute process. CBC unremarkable. Patient has history of COPD secondary to asthmatic bronchitis. Removed oxygen briefly today while preparing breakfast, and may represent bronchoconstriction accounting for associated shortness of breath. Patient has been intolerant to bronchodilators in the past. Did not attempt today. Patient continues to have chest pain. He has typical and atypical features. Given prior cardiac history and risk factors, will admit for ACS rule-out. Upon further discussion with Dr. Graciela Husbands, if pain unrelieved with morphine/NTG, will plan for cardiac catheterization to evaluate his grafts.   - Admit to stepdown  - Cycle cardiac  biomarkers  - Heparin, morphine, NTG gtt   - If pain unrelieved, will plan for cath given high pretest probability of cardiac ischemia, even given negative cardiac enzymes  - Will order D-dimer  - Continue ASA, ARB, selective BB, statin, Imdur/Hydralazine/NTG SL  - NPO now  2. COPD- as above. O2 dependent. Likely contributing to chest pain/shortness of breath picture.  - Continue O2  - Nebs PRN  3. LE edema- likely secondary to chronic venous insufficiency. No echo evidence of systolic or diastolic dysfunction on echo 04/13.   - Monitor weights, sodium restriction, I/Os  4. Type 2 DM- no outpatient medications   - CBGs  - Will order Hgb A1C  - Add SSI should he require glycemic control   5. HTN  - Continue  antihypertensives 6. HL   - Continue statin     Signed, R. Hurman Horn, PA-C 01/06/2012, 3:20 PM  Pt with worrisome chest pain, now again after neg myoview a month ago, with hx of prior CABG 1995 assoc with sob diaphoresis and mod response intially to NTG but now persisting. Will add hep and MS and hold on BB 2/2 bradycardia, notwithstanding normal ECG  If can't resolve chest pain, will cath urgently    COPD may be contributing but not impressive wheezing on exam.     Berton Mount MD 01/06/2012 5:01 PM

## 2012-01-06 NOTE — ED Notes (Signed)
Chest pain on set this am at 0800 substernal non radiating, took ntg x 3 at home and some releif, given asa and morphine by ems with no relief. 12 lead normal with ems.

## 2012-01-07 ENCOUNTER — Encounter (HOSPITAL_COMMUNITY)
Admission: EM | Disposition: A | Discharge status: Home / Self Care | Payer: Self-pay | Source: Home / Self Care | Attending: Internal Medicine

## 2012-01-07 DIAGNOSIS — R079 Chest pain, unspecified: Secondary | ICD-10-CM

## 2012-01-07 DIAGNOSIS — I2510 Atherosclerotic heart disease of native coronary artery without angina pectoris: Secondary | ICD-10-CM

## 2012-01-07 HISTORY — PX: LEFT HEART CATHETERIZATION WITH CORONARY/GRAFT ANGIOGRAM: SHX5450

## 2012-01-07 LAB — CBC
HCT: 40.8 % (ref 39.0–52.0)
Hemoglobin: 13.2 g/dL (ref 13.0–17.0)
MCH: 31.1 pg (ref 26.0–34.0)
MCHC: 32 g/dL (ref 30.0–36.0)
MCV: 96.2 fL (ref 78.0–100.0)
MCV: 96.9 fL (ref 78.0–100.0)
Platelets: 180 10*3/uL (ref 150–400)
RBC: 4.24 MIL/uL (ref 4.22–5.81)
RBC: 4.25 MIL/uL (ref 4.22–5.81)
RDW: 13.3 % (ref 11.5–15.5)
WBC: 9 10*3/uL (ref 4.0–10.5)

## 2012-01-07 LAB — HEPARIN LEVEL (UNFRACTIONATED): Heparin Unfractionated: 0.29 IU/mL — ABNORMAL LOW (ref 0.30–0.70)

## 2012-01-07 LAB — BASIC METABOLIC PANEL
CO2: 34 mEq/L — ABNORMAL HIGH (ref 19–32)
Calcium: 9 mg/dL (ref 8.4–10.5)
Creatinine, Ser: 1.38 mg/dL — ABNORMAL HIGH (ref 0.50–1.35)
GFR calc non Af Amer: 47 mL/min — ABNORMAL LOW (ref >90)
Glucose, Bld: 122 mg/dL — ABNORMAL HIGH (ref 70–99)

## 2012-01-07 LAB — GLUCOSE, CAPILLARY
Glucose-Capillary: 125 mg/dL — ABNORMAL HIGH (ref 70–99)
Glucose-Capillary: 120 mg/dL — ABNORMAL HIGH (ref 70–99)
Glucose-Capillary: 120 mg/dL — ABNORMAL HIGH (ref 70–99)

## 2012-01-07 LAB — CARDIAC PANEL(CRET KIN+CKTOT+MB+TROPI)
CK, MB: 61.8 ng/mL (ref 0.3–4.0)
CK, MB: 50 ng/mL (ref 0.3–4.0)
Relative Index: 5.6 — ABNORMAL HIGH (ref 0.0–2.5)
Troponin I: 12.3 ng/mL (ref <0.30)

## 2012-01-07 SURGERY — LEFT HEART CATHETERIZATION WITH CORONARY/GRAFT ANGIOGRAM

## 2012-01-07 MED ORDER — HEPARIN (PORCINE) IN NACL 100-0.45 UNIT/ML-% IJ SOLN
1800.0000 [IU]/h | INTRAMUSCULAR | Status: DC
  Administered 2012-01-07: 1400 [IU]/h via INTRAVENOUS
  Administered 2012-01-08: 1600 [IU]/h via INTRAVENOUS
  Administered 2012-01-08 – 2012-01-09 (×3): 1800 [IU]/h via INTRAVENOUS
  Filled 2012-01-07 (×7): qty 250

## 2012-01-07 MED ORDER — NITROGLYCERIN 0.2 MG/ML ON CALL CATH LAB
INTRAVENOUS | Status: AC
  Filled 2012-01-07: qty 1

## 2012-01-07 MED ORDER — FUROSEMIDE 10 MG/ML IJ SOLN
40.0000 mg | Freq: Two times a day (BID) | INTRAMUSCULAR | Status: DC
  Administered 2012-01-07 – 2012-01-09 (×5): 40 mg via INTRAVENOUS
  Filled 2012-01-07 (×6): qty 4

## 2012-01-07 MED ORDER — LIDOCAINE HCL (PF) 1 % IJ SOLN
INTRAMUSCULAR | Status: AC
  Filled 2012-01-07: qty 30

## 2012-01-07 MED ORDER — SODIUM CHLORIDE 0.9 % IV SOLN: INTRAVENOUS | Status: DC

## 2012-01-07 MED ORDER — SODIUM CHLORIDE 0.9 % IV SOLN
INTRAVENOUS | Status: DC
  Administered 2012-01-07 – 2012-01-09 (×3): via INTRAVENOUS

## 2012-01-07 MED ORDER — HEPARIN (PORCINE) IN NACL 2-0.9 UNIT/ML-% IJ SOLN
INTRAMUSCULAR | Status: AC
  Filled 2012-01-07: qty 2000

## 2012-01-07 MED ORDER — ASPIRIN 81 MG PO CHEW
CHEWABLE_TABLET | ORAL | Status: AC
  Filled 2012-01-07: qty 4

## 2012-01-07 MED ORDER — FENTANYL CITRATE 0.05 MG/ML IJ SOLN
INTRAMUSCULAR | Status: AC
  Filled 2012-01-07: qty 2

## 2012-01-07 MED ORDER — MIDAZOLAM HCL 2 MG/2ML IJ SOLN
INTRAMUSCULAR | Status: AC
  Filled 2012-01-07: qty 2

## 2012-01-07 MED ORDER — ACETAMINOPHEN 325 MG PO TABS: 650.0000 mg | ORAL_TABLET | ORAL | Status: DC | PRN

## 2012-01-07 NOTE — Progress Notes (Signed)
Pts. Rhythm in the monitor irregular, noticed some dropping p wave, 12 lead ekg done shows, type 1- 2nd degree heart block, pts. denies any  Symptoms, cardiac enzymes result shows +, troponin is up to 6.65. Dr. Terressa Koyanagi made aware. Will cont. to monitor.

## 2012-01-07 NOTE — Interval H&P Note (Signed)
History and Physical Interval Note:  01/07/2012 8:48 AM  [name]  has presented today for surgery, with the diagnosis of Chest pain  The various methods of treatment have been discussed with the patient and family. After consideration of risks, benefits and other options for treatment, the patient has consented to  Procedure(s) (LRB): LEFT HEART CATHETERIZATION WITH CORONARY ANGIOGRAM (N/A) as a surgical intervention .  The patient's history has been reviewed, patient examined, no change in status, stable for surgery.  I have reviewed the patients' chart and labs.  Questions were answered to the patient's satisfaction.     Theron Arista [facility] 01/07/2012 8:48 AM

## 2012-01-07 NOTE — Progress Notes (Signed)
Pt. woke up due to rt. leg cramping, resolved on its own after sitting up at the side of the bed. Few minutes complained of chest pain 4/10, slight short of breath, NTG drip tirtrated up for pain. EKG done , after 10-15 mins  Pt. stated pain was now 2/10, feeling better. Dr. Terressa Koyanagi made aware of pts. chest pain and EKG result. O2 maintained at 3l ,sats. up to 93-96%. Will cont. to monitor pt.

## 2012-01-07 NOTE — Progress Notes (Signed)
Pt. has 2 episode of vomiting especially after eating 30% of his food on the tray earlier, zofran given 4mg . for nausea, Dr. Terressa Koyanagi was paged for NGT drip order clarification. and also made aware of pts. symptoms.

## 2012-01-07 NOTE — Progress Notes (Signed)
PROGRESS NOTE  Subjective:   [name] is a 78 yo with PMHx significant for CAD (CABG in 1995; Lexiscan Myoview normal; LVEF 60% 03/13), COPD (with hypoxia, O2 dependent), type 2 DM, HTN, HL and morbid obesity who presents to [facility] ED today with c/o chest pain and shortness of breath.  Of note, the patient has reported diastolic dysfunction. Echo in 04/13 revealed LVEF 55-60%, no mentioned diastolic dysfunction. Moderate LA dilatation was noted.   He was admitted with CP and abnormal troponin levels.  Cath6/20 revealed a significant stenosis in the SVG to the Ramus and RCA.  The plan is to proceed with PCI in several days if his renal fxn is stable.  He is feeling better.  Objective:    Vital Signs:   Temp:  [97.3 F (36.3 C)-98.4 F (36.9 C)] 97.7 F (36.5 C) (06/21 0750) Pulse Rate:  [53-134] 82  (06/21 0837) Resp:  [7-24] 13  (06/21 0800) BP: (104-147)/(48-82) 124/51 mmHg (06/21 0800) SpO2:  [92 %-100 %] 95 % (06/21 0800) Weight:  [300 lb 0.7 oz (136.1 kg)-304 lb (137.893 kg)] 300 lb 0.7 oz (136.1 kg) (06/20 2000)      24-hour weight change: Weight change:   Weight trends: Filed Weights   01/06/12 1701 01/06/12 2000  Weight: 304 lb (137.893 kg) 300 lb 0.7 oz (136.1 kg)    Intake/Output:  06/20 0701 - 06/21 0700 In: 348 [P.O.:240; I.V.:108] Out: 650 [Urine:650] Total I/O In: 85 [I.V.:85] Out: 30 [Urine:30]   Physical Exam: BP 124/51  Pulse 82  Temp 97.7 F (36.5 C) (Oral)  Resp 13  Ht 5\' 11"  (1.803 m)  Wt 300 lb 0.7 oz (136.1 kg)  BMI 41.85 kg/m2  SpO2 95%  General: Vital signs reviewed and noted. Well-developed, well-nourished, in no acute distress; alert, appropriate and cooperative .  Head: Normocephalic, atraumatic.  Eyes: conjunctivae/corneas clear.  EOM's intact.   Throat: normal  Neck: Supple. Normal carotids. No JVD  Lungs:  Clear to auscultation  Heart: Regular rate,  With normal  S1 S2. No murmurs, gallops or rubs  Abdomen:  Soft, non-tender,  non-distended with normoactive bowel sounds. No hepatomegaly. No rebound/guarding. No abdominal masses.  Extremities: Distal pedal pulses are 2+ .  No edema.  Cath site is ok   Neurologic: A&O X3, CN II - XII are grossly intact. Motor strength is 5/5 in the all 4 extremities.  Psych: Responds to questions appropriately with normal affect.    Labs: BMET:  Basename 01/07/12 0509 01/06/12 1336  NA 140 141  K 4.4 4.4  CL 99 101  CO2 34* 34*  GLUCOSE 122* 109*  BUN 20 18  CREATININE 1.38* 1.32  CALCIUM 9.0 9.4  MG -- --  PHOS -- --    Liver function tests:  Basename 01/06/12 1336  AST 21  ALT 18  ALKPHOS 65  BILITOT 0.4  PROT 7.1  ALBUMIN 3.6   No results found for this basename: LIPASE:2,AMYLASE:2 in the last 72 hours  CBC:  Basename 01/07/12 0509 01/07/12 0140 01/06/12 1336  WBC 9.2 9.0 --  NEUTROABS -- -- 5.2  HGB 13.2 13.2 --  HCT 41.2 40.8 --  MCV 96.9 96.2 --  PLT 180 174 --    Cardiac Enzymes:  Basename 01/07/12 0509 01/06/12 2254 01/06/12 1709 01/06/12 1336  CKTOTAL 808* 641* 299* --  CKMB 61.8* 52.0* 15.5* --  TROPONINI 9.81* 6.65* 0.81* <0.30    Coagulation Studies:  Basename 01/06/12 1336  LABPROT 13.5  INR 1.01    Other: No components found with this basename: POCBNP:3  Basename 01/06/12 1709  DDIMER 0.30    01/07/2012 NSR  Medications:    Infusions:    . sodium chloride 50 mL/hr at 01/07/12 1040  . heparin    . nitroGLYCERIN 30 mcg/min (01/07/12 0700)  . DISCONTD: heparin 1,400 Units/hr (01/07/12 0700)    Scheduled Medications:    . aspirin      . aspirin  325 mg Oral Daily  . atorvastatin  10 mg Oral QHS  . clopidogrel  300 mg Oral Once  . clopidogrel  75 mg Oral Q breakfast  . fentaNYL      . furosemide  40 mg Intravenous Q12H  . heparin      . heparin  4,000 Units Intravenous Once  . hydrALAZINE  25 mg Oral BID  . isosorbide mononitrate  30 mg Oral Daily  . lidocaine      . losartan  100 mg Oral Daily  .  midazolam      .  morphine injection  4 mg Intravenous Once  .  morphine injection  4 mg Intravenous Once  . Nebivolol HCl  20 mg Oral Daily  . nitroGLYCERIN      . nitroGLYCERIN  2-200 mcg/min Intravenous Once  . potassium chloride SA  20 mEq Oral Daily  . sodium chloride  3 mL Intravenous Q12H  . DISCONTD: furosemide  40 mg Oral Daily    Assessment/ Plan:    1. NSTEMI (non-ST elevated myocardial infarction) (01/06/2012)  due to stenosis of the SVG.  Plan is to proceed with PCI on Monday if his renal function remains stable  HYPERLIPIDEMIA-MIXED - continue atorvastatin  HYPERTENSION, UNSPECIFIED (01/24/2009)  BP has been stable   Obstructive chronic bronchitis without exacerbation (10/16/2010) Continue meds  Type 2 diabetes mellitus (01/06/2012)   Glucose levels are ok   Disposition: PCI with Dr. [place] on Monday. Length of Stay: 1  Vesta Mixer, Montez Hageman., MD, [facility] 01/07/2012, 11:04 AM Office [phone] Pager [phone]

## 2012-01-07 NOTE — H&P (Signed)
 Cardiology Admission Note   Patient ID: [name] MRN: [id number], DOB/AGE: 04/07/1931   Admit date: 01/06/2012 Date of Consult: 01/06/2012  Primary Physician: Sanchez,Fred ALAN, MD Primary Cardiologist: Sanchez, Tom, MD  Pt. Profile: [name] is a 78yo male with PMHx significant for CAD (CABG in 1995; Lexiscan Myoview normal; LVEF 60% 03/13), COPD (with hypoxia, O2 dependent), type 2 DM, HTN, HL and morbid obesity who presents to [facility] ED today with c/o chest pain and shortness of breath.   Of note, the patient has reported diastolic dysfunction. Echo in 04/13 revealed LVEF 55-60%, no mentioned diastolic dysfunction. Moderate LA dilatation was noted.   Reason for consult: evaluation/management of chest pain  Problem List: Past Medical History  Diagnosis Date  . CAD (coronary artery disease)     remote CABG in 1995 per Dr. Bartle. Last myoview in 2011 with normal perfusion and normal EF  . HTN (hypertension)   . HLD (hyperlipidemia)   . Type II or unspecified type diabetes mellitus without mention of complication, not stated as uncontrolled   . Obesity   . Syncope   . COPD (chronic obstructive pulmonary disease)   . Hypoxia     Past Surgical History  Procedure Date  . Coronary artery bypass graft 1995  . Functional endoscopic sinus surgery 2006  . Inguinal hernia repair 2006  . Hydrocele excision 2005  . Prostate surgery 1998     Allergies: No Known Allergies  HPI:   The patient has been seen multiple times by Dr. Wright with pulmonology since 2012. He was initially seen for syncope and collapse. He was found to be hypoxemic, and diagnosed with COPD attributed to secondary tobacco exposure and chronic obstructive asthma. On follow-up in 03/13, he was noted to have evidence of volume excess on exam attributed to acute on chronic diastolic CHF (evidenced on prior echocardiograms). Additionally, he complained of vague, nonspecific chest pain. He was started on  ARB/BB/Hydralazine/Lasix with KCl.   He was seen in our clinic shortly after with similar complaints. He was started on Imdur and set up for Lexsican Myoview which revealed no evidence of ischemia and preserved LV function. A 2D echo was ordered returned the above findings. He recently followed up with Dr. Wall in the office on 11/26/11. The patient still complained of DOE, but denied chest pain. Given his normal Lexiscan Myoview results, no further cardiac ischemia work-up was pursued. The patient's DOE was largely attributed to his underlying pulmonary disease and COPD.   He reports experiencing constant SSCP beginning around 8AM with associated shortness of breath 9/10. No radiation. No association with meals and different from reflux symptoms. He had removed his oxygen in order to prepare breakfast for his wife. He sat to rest, and placed his oxygen back on. His chest pain persisted. He took NTG SL x 2 with some relief rated at a 6/10. He attempted to go to the drug store, but the pain and shortness of breath persisted. He reports associated diaphoresis, lightheadedness. He endorses baseline PND, orthopnea. Denies n/v, fevers, chills, cough and sick contacts. He reports baseline LE edema. No unilateral leg swelling, tenderness. EMS was called at the request of the patient and he was transported to [facility] ED. Low-dose ASA x 4 given.   Upon ED arrival, EKG reveals unchanged anterior and inferior Q waves, LVH, otherwise no acute changes. Initial troponin-I WNL. CBC unremarkable. BMET with mildly elevated CO2. CXR revealed cardiomegaly, low volumes and no gross evidence of CHF.     Home Medications: Prior to Admission medications   Medication Sig Start Date End Date Taking? Authorizing Provider  ARTIFICIAL TEAR OP Place 2 drops into both eyes daily as needed. For dry eyes   Yes Historical Provider, MD  aspirin 325 MG tablet Take 325 mg by mouth daily.     Yes Historical Provider, MD  atorvastatin (LIPITOR) 10  MG tablet Take 10 mg by mouth at bedtime.   Yes Historical Provider, MD  furosemide (LASIX) 40 MG tablet Take 40 mg by mouth daily.   Yes Historical Provider, MD  hydrALAZINE (APRESOLINE) 25 MG tablet Take 25 mg by mouth 2 (two) times daily. 10/11/11 10/10/12 Yes Fred E Wright, MD  isosorbide mononitrate (IMDUR) 30 MG 24 hr tablet Take 30 mg by mouth daily.   Yes Historical Provider, MD  losartan (COZAAR) 100 MG tablet Take 100 mg by mouth daily.   Yes Historical Provider, MD  Meclizine HCl (BONINE) 25 MG CHEW Chew 1 tablet by mouth 2 (two) times daily.     Yes Historical Provider, MD  Multiple Vitamins-Minerals (MULTIVITAMIN & MINERAL PO) Take 1 tablet by mouth daily.     Yes Historical Provider, MD  Nebivolol HCl (BYSTOLIC) 20 MG TABS Take 20 mg by mouth daily.   Yes Historical Provider, MD  nitroGLYCERIN (NITROSTAT) 0.4 MG SL tablet Place 0.4 mg under the tongue every 5 (five) minutes as needed.     Yes Historical Provider, MD  potassium chloride SA (K-DUR,KLOR-CON) 20 MEQ tablet Take 20 mEq by mouth daily.   Yes Historical Provider, MD    Inpatient Medications:     .  morphine injection  4 mg Intravenous Once  .  morphine injection  4 mg Intravenous Once  . nitroGLYCERIN  2-200 mcg/min Intravenous Once    (Not in a hospital admission)  History reviewed. No pertinent family history.   History   Social History  . Marital Status: Married    Spouse Name: N/A    Number of Children: N/A  . Years of Education: N/A   Occupational History  . Retired     diesel mechanic/heavy equipment   Social History Main Topics  . Smoking status: Never Smoker   . Smokeless tobacco: Never Used  . Alcohol Use: No  . Drug Use: No  . Sexually Active: No   Other Topics Concern  . Not on file   Social History Narrative  . No narrative on file     Review of Systems: General: negative for chills, fever, night sweats or weight changes.  Cardiovascular: positive for chest pain, sob,  lightheadedness, PND, diaphoresis, edema, negative for dyspnea on exertion, orthopnea, palpitations Dermatological: positive for RLE rash Respiratory: negative for cough or wheezing Urologic: negative for hematuria Abdominal: negative for nausea for vomiting, diarrhea, bright red blood per rectum, melena, or hematemesis Neurologic: negative for visual changes, syncope All other systems reviewed and are otherwise negative except as noted above.  Physical Exam: Blood pressure 127/75, pulse 59, temperature 98.3 F (36.8 C), temperature source Oral, resp. rate 7, SpO2 99.00%.   General: Obese, well developed, in no acute distress. Head: Normocephalic, atraumatic, sclera non-icteric, no xanthomas, nares are without discharge Neck: Negative for carotid bruits. JVD not elevated. Lungs: Distant breath sounds. No wheezes, rales or rhonchi appreciated. Breathing unlabored.  Heart: RRR with S1 S2. No murmurs, rubs, or gallops appreciated. Abdomen: Soft, non-tender, distention of central obesity noted, normoactive bowel sounds. No hepatomegaly. No rebound/guarding. No obvious abdominal masses. Msk:  Strength and   tone appears normal for age. Extremities: Trace bilateral edema. Tortuous LLE vein vs palpable cord. Non-tender. Some appreciable bilateral LE edema. No clubbing or cyanosis.  Distal pedal pulses are 2+ and equal bilaterally. Neuro: Alert and oriented X 3. Moves all extremities spontaneously. Psych:  Responds to questions appropriately with a normal affect.  Labs: Recent Labs  Basename 01/06/12 1336   WBC 7.9   HGB 14.0   HCT 42.4   MCV 94.9   PLT 168   Lab 01/06/12 1336  NA 141  K 4.4  CL 101  CO2 34*  BUN 18  CREATININE 1.32  CALCIUM 9.4  PROT 7.1  BILITOT 0.4  ALKPHOS 65  ALT 18  AST 21  AMYLASE --  LIPASE --  GLUCOSE 109*   Recent Labs  Basename 01/06/12 1336   CKTOTAL --   CKMB --   CKMBINDEX --   TROPONINI <0.30   Radiology/Studies: Dg Chest Port 1  View  01/06/2012  *RADIOLOGY REPORT*  Clinical Data: Chest pain.  Numbness.  Short of breath.  PORTABLE CHEST - 1 VIEW  Comparison: 10/11/2011.  Findings: Apical lordotic projection. Cardiomegaly.  CABG.  Low volume chest with basilar atelectasis.  No gross opacification.  IMPRESSION: Low volume chest and cardiomegaly.  No gross evidence of failure.  Original Report Authenticated By: GEOFFREY LAMKE, M.D.    EKG: NSR, 60 bpm, 1st degree AVB, Q waves vs poor R wave progression V1-V4, III, aVF, LVH, LAE; unchanged from prior tracings  ASSESSMENT AND PLAN:   1. CAD/chest pain- patient with remote CABG in 1995; cardiac risk factors noted above. Recently seen in the clinic with normal Lexiscan Myoview findings. Patient has significant COPD and is oxygen-dependent. He has endorsed prior intermittent episodes of chest pain, however this was more severe and constant. Improved by NTG SL and gtt. Not reminiscent of reflux discomfort. Denied symptoms concerning for DVT, however patient is fairly immobile, obese with venous insufficiency (tortuous vein LLE) vs palpable cord. Non-tender. Lungs clear. Bilateral trace pretibial edema noted. EKG with acute ischemic changes. Consistent with prior tracings. Troponin-I negative x 1. CXR without evidence of pulmonary edema or acute process. CBC unremarkable. Patient has history of COPD secondary to asthmatic bronchitis. Removed oxygen briefly today while preparing breakfast, and may represent bronchoconstriction accounting for associated shortness of breath. Patient has been intolerant to bronchodilators in the past. Did not attempt today. Patient continues to have chest pain. He has typical and atypical features. Given prior cardiac history and risk factors, will admit for ACS rule-out. Upon further discussion with Dr. Klein, if pain unrelieved with morphine/NTG, will plan for cardiac catheterization to evaluate his grafts.   - Admit to stepdown  - Cycle cardiac  biomarkers  - Heparin, morphine, NTG gtt   - If pain unrelieved, will plan for cath given high pretest probability of cardiac ischemia, even given negative cardiac enzymes  - Will order D-dimer  - Continue ASA, ARB, selective BB, statin, Imdur/Hydralazine/NTG SL  - NPO now  2. COPD- as above. O2 dependent. Likely contributing to chest pain/shortness of breath picture.  - Continue O2  - Nebs PRN  3. LE edema- likely secondary to chronic venous insufficiency. No echo evidence of systolic or diastolic dysfunction on echo 04/13.   - Monitor weights, sodium restriction, I/Os  4. Type 2 DM- no outpatient medications   - CBGs  - Will order Hgb A1C  - Add SSI should he require glycemic control   5. HTN  - Continue   antihypertensives 6. HL   - Continue statin     Signed, R. Tony Arguello, PA-C 01/06/2012, 3:20 PM  Pt with worrisome chest pain, now again after neg myoview a month ago, with hx of prior CABG 1995 assoc with sob diaphoresis and mod response intially to NTG but now persisting. Will add hep and MS and hold on BB 2/2 bradycardia, notwithstanding normal ECG  If can't resolve chest pain, will cath urgently    COPD may be contributing but not impressive wheezing on exam.     Steve Klein MD 01/06/2012 5:01 PM   

## 2012-01-07 NOTE — CV Procedure (Signed)
   Cardiac Catheterization Procedure Note  Name: [name] MRN: [id number] DOB: 02/28/1931  Procedure: Left Heart Cath, Selective Coronary Angiography, LV angiography  Indication: 78 year old white male with history of morbid obesity, COPD, diabetes, hyperlipidemia, hypertension, and known coronary disease who presents with a non-ST elevation myocardial infarction. He had prior coronary bypass surgery in 1995 by Dr. Laneta Simmers.    Procedural details: The right groin was prepped, draped, and anesthetized with 1% lidocaine. Using modified Seldinger technique, a 5 French sheath was introduced into the right femoral artery. Standard Judkins catheters were used for coronary angiography and left ventriculography. Catheter exchanges were performed over a guidewire. There were no immediate procedural complications. The patient was transferred to the post catheterization recovery area for further monitoring.  Procedural Findings: Hemodynamics:  AO 146/75 with a mean of 107 mmHg LV 148/39 mmHg   Coronary angiography: Coronary dominance: right  Left mainstem: There is 20-30% tapering of the distal left main.  Left anterior descending (LAD): There is a 90% stenosis in the proximal LAD. It is occluded after the first septal perforator.  There is a large ramus branch which has a 95% ostial stenosis and then is occluded proximally. This is supplied by a vein graft.  Left circumflex (LCx): The left circumflex coronary traverses the AV groove. It gives rise to 3 very small marginal branches. There is a 50% stenosis in the mid circumflex.  Right coronary artery (RCA): The right coronary is a large dominant vessel. It has diffuse 30% disease in the proximal vessel. The mid vessel there is a 90% stenosis prior to the takeoff of 2 rather large right ventricular branches. Right coronary is occluded in the mid vessel.  The saphenous vein graft to the distal right coronary is widely patent and fills both the  PDA and posterior lateral branches.  The saphenous vein graft to the large intermediate branch is patent. In the mid graft there is a complex aneurysmal stenosis 70-80%.  The LIMA graft to the LAD is difficult to directly cannulate. By flush shots we demonstrate that it is widely patent with good runoff to the LAD.  Left ventriculography: Left ventricular systolic function is normal, LVEF is estimated at 55-60%, there is mild midanterior wall hypokinesis, there is no significant mitral regurgitation   Final Conclusions:  1. Severe three-vessel obstructive atherosclerotic coronary disease. 2. Patent saphenous vein graft to the distal right coronary. 3. Patent saphenous vein graft to the intermediate branch with moderate to severe complex stenosis in the mid graft. 4. Patent LIMA graft to LAD. 5. Preserved LV function  Recommendations: Potential areas of ischemia include the saphenous vein graft to the intermediate and the mid native right coronary prior to 2 large right ventricular branches. This is an 78 year old vein graft. The patient's left ventricular filling pressures were quite high. He has chronic renal insufficiency. I've recommended intensification of his medical therapy with diuresis. We will resume heparin later today. We will continue with aspirin, Plavix, and IV nitroglycerin. I'll plan on percutaneous intervention of the saphenous vein graft to the intermediate and the native right coronary on Monday if his renal function is stable.  Theron Arista [facility] 01/07/2012, 9:28 AM

## 2012-01-07 NOTE — Progress Notes (Signed)
Received order however sts "If PCI", which pt did not receive (plans for [facility]). Please reorder or specify ambulation for weekend if appropriate. O/w will hold until after PCI Mon. Thx. Ethelda Chick CES, ACSM

## 2012-01-07 NOTE — Progress Notes (Signed)
ANTICOAGULATION CONSULT NOTE - Follow Up Consult  Pharmacy Consult for heparin Indication: chest pain/ACS  Labs:  Basename 01/07/12 0140 01/06/12 2254 01/06/12 1709 01/06/12 1336  HGB 13.2 -- -- 14.0  HCT 40.8 -- -- 42.4  PLT 174 -- -- 168  APTT -- -- -- 28  LABPROT -- -- -- 13.5  INR -- -- -- 1.01  HEPARINUNFRC 0.29* -- -- --  CREATININE -- -- -- 1.32  CKTOTAL -- 641* 299* --  CKMB -- 52.0* 15.5* --  TROPONINI -- 6.65* 0.81* <0.30    Assessment: 78yo male slightly subtherapeutic on heparin with initial dosing for CP; troponin now up to 6.65.  Goal of Therapy:  Heparin level 0.3-0.7 units/ml   Plan:  Will increase heparin gtt by 1-2 units/kg/hr to 1400 units/hr and check level in 8hr.  Colleen Can PharmD BCPS 01/07/2012,2:39 AM

## 2012-01-07 NOTE — Progress Notes (Signed)
ANTICOAGULATION CONSULT NOTE - Follow Up Consult  Pharmacy Consult for Heparin Indication: chest pain/ACS  No Known Allergies  Patient Measurements: Heparin Dosing Weight: 107.3kg  Vital Signs: Temp: 97.7 F (36.5 C) (06/21 0750) Temp src: Oral (06/21 0750) BP: 125/56 mmHg (06/21 0700) Pulse Rate: 82  (06/21 0837)  Labs:  Basename 01/07/12 0509 01/07/12 0140 01/06/12 2254 01/06/12 1709 01/06/12 1336  HGB 13.2 13.2 -- -- --  HCT 41.2 40.8 -- -- 42.4  PLT 180 174 -- -- 168  APTT -- -- -- -- 28  LABPROT -- -- -- -- 13.5  INR -- -- -- -- 1.01  HEPARINUNFRC -- 0.29* -- -- --  CREATININE 1.38* -- -- -- 1.32  CKTOTAL 808* -- 641* 299* --  CKMB 61.8* -- 52.0* 15.5* --  TROPONINI 9.81* -- 6.65* 0.81* --    Estimated Creatinine Clearance: 61.1 ml/min (by C-G formula based on Cr of 1.38).  Assessment: 81yom to resume heparin s/p cath for severe 3v disease awaiting staged PCI of saphenous vein graft on Monday.    Goal of Therapy:  Heparin level 0.3-0.7 units/ml Monitor platelets by anticoagulation protocol: Yes   Plan:  1) At 1800, resume heparin @ 1400 units/hr 2) Heparin level 8h after resumed  Fredrik Rigger 01/07/2012,10:02 AM

## 2012-01-08 DIAGNOSIS — I5032 Chronic diastolic (congestive) heart failure: Secondary | ICD-10-CM

## 2012-01-08 DIAGNOSIS — I214 Non-ST elevation (NSTEMI) myocardial infarction: Secondary | ICD-10-CM

## 2012-01-08 LAB — BASIC METABOLIC PANEL
BUN: 21 mg/dL (ref 6–23)
BUN: 19 mg/dL (ref 6–23)
CO2: 38 mEq/L — ABNORMAL HIGH (ref 19–32)
Chloride: 96 mEq/L (ref 96–112)
Chloride: 96 mEq/L (ref 96–112)
Creatinine, Ser: 1.32 mg/dL (ref 0.50–1.35)
GFR calc Af Amer: 58 mL/min — ABNORMAL LOW (ref >90)
GFR calc non Af Amer: 51 mL/min — ABNORMAL LOW (ref >90)
Glucose, Bld: 89 mg/dL (ref 70–99)
Potassium: 4.2 mEq/L (ref 3.5–5.1)
Sodium: 140 mEq/L (ref 135–145)

## 2012-01-08 LAB — CBC
HCT: 40.6 % (ref 39.0–52.0)
MCH: 31.2 pg (ref 26.0–34.0)
MCHC: 32.3 g/dL (ref 30.0–36.0)
MCV: 96.7 fL (ref 78.0–100.0)
RDW: 13.4 % (ref 11.5–15.5)
WBC: 8.4 10*3/uL (ref 4.0–10.5)

## 2012-01-08 LAB — HEPARIN LEVEL (UNFRACTIONATED): Heparin Unfractionated: 0.32 IU/mL (ref 0.30–0.70)

## 2012-01-08 LAB — GLUCOSE, CAPILLARY
Glucose-Capillary: 89 mg/dL (ref 70–99)
Glucose-Capillary: 119 mg/dL — ABNORMAL HIGH (ref 70–99)
Glucose-Capillary: 92 mg/dL (ref 70–99)

## 2012-01-08 MED ORDER — ATORVASTATIN CALCIUM 80 MG PO TABS
80.0000 mg | ORAL_TABLET | Freq: Every day | ORAL | Status: DC
  Administered 2012-01-08 – 2012-01-10 (×3): 80 mg via ORAL
  Filled 2012-01-08 (×4): qty 1

## 2012-01-08 NOTE — Progress Notes (Signed)
Patient ID: [name], male   DOB: [birth year], 78 y.o.   MRN: [id number]    SUBJECTIVE: Doing well this morning.  No chest pain, no dyspnea.  Diuresed well last night.   Current Facility-Administered Medications  Medication Dose Route Frequency Provider Last Rate Last Dose  . 0.9 %  sodium chloride infusion  250 mL Intravenous PRN Gery Pray, PA-C 10 mL/hr at 01/06/12 2000 250 mL at 01/06/12 2000  . 0.9 %  sodium chloride infusion   Intravenous Continuous Peter M Swazil[place], MD 50 mL/hr at 01/07/12 1536    . 0.9 %  sodium chloride infusion   Intravenous Continuous Peter M [place], MD      . acetaminophen (TYLENOL) tablet 650 mg  650 mg Oral Q4H PRN Roger A Arguello, PA-C      . albuterol (PROVENTIL HFA;VENTOLIN HFA) 108 (90 BASE) MCG/ACT inhaler 2 puff  2 puff Inhalation Q4H PRN Roger A Arguello, PA-C      . ALPRAZolam Prudy Feeler) tablet 0.25 mg  0.25 mg Oral BID PRN Gery Pray, PA-C      . aspirin tablet 325 mg  325 mg Oral Daily Roger A Arguello, PA-C   325 mg at 01/08/12 0933  . atorvastatin (LIPITOR) tablet 10 mg  10 mg Oral QHS Roger A Arguello, PA-C   10 mg at 01/07/12 2212  . clopidogrel (PLAVIX) tablet 75 mg  75 mg Oral Q breakfast Duke Salvia, MD   75 mg at 01/08/12 5284  . furosemide (LASIX) injection 40 mg  40 mg Intravenous Q12H Peter M [place], MD   40 mg at 01/08/12 0934  . heparin ADULT infusion 100 units/mL (25000 units/250 mL)  1,600 Units/hr Intravenous Continuous Colleen Can, PHARMD 16 mL/hr at 01/08/12 0341 1,600 Units/hr at 01/08/12 0341  . hydrALAZINE (APRESOLINE) tablet 25 mg  25 mg Oral BID Roger A Arguello, PA-C   25 mg at 01/08/12 0935  . isosorbide mononitrate (IMDUR) 24 hr tablet 30 mg  30 mg Oral Daily Roger A Arguello, PA-C   30 mg at 01/07/12 1716  . losartan (COZAAR) tablet 100 mg  100 mg Oral Daily Roger A Arguello, PA-C   100 mg at 01/08/12 0935  . morphine 4 MG/ML injection 4 mg  4 mg Intravenous Q4H PRN Gery Pray, PA-C   4 mg at  01/06/12 1716  . Nebivolol HCl TABS 20 mg  20 mg Oral Daily Roger A Arguello, PA-C   20 mg at 01/08/12 0935  . nitroGLYCERIN (NITROSTAT) SL tablet 0.4 mg  0.4 mg Sublingual Q5 min PRN Roger A Arguello, PA-C      . nitroGLYCERIN 0.2 mg/mL in dextrose 5 % infusion  2-200 mcg/min Intravenous Titrated Pelbreton C. Balfour, MD 1.5 mL/hr at 01/07/12 2339 5 mcg/min at 01/07/12 2339  . ondansetron (ZOFRAN) injection 4 mg  4 mg Intravenous Q6H PRN Gery Pray, PA-C   4 mg at 01/06/12 2212  . potassium chloride SA (K-DUR,KLOR-CON) CR tablet 20 mEq  20 mEq Oral Daily Roger A Arguello, PA-C   20 mEq at 01/08/12 0935  . sodium chloride 0.9 % injection 3 mL  3 mL Intravenous Q12H Roger A Arguello, PA-C   3 mL at 01/08/12 0936  . sodium chloride 0.9 % injection 3 mL  3 mL Intravenous PRN Roger A Arguello, PA-C      . zolpidem (AMBIEN) tablet 5 mg  5 mg Oral QHS PRN Gery Pray, PA-C  heparin gtt NTG gtt at 5    Filed Vitals:   01/08/12 0400 01/08/12 0500 01/08/12 0743 01/08/12 0745  BP: 121/59  118/54   Pulse: 72   62  Temp: 99 F (37.2 C)  99.1 F (37.3 C)   TempSrc: Oral  Oral   Resp:   15 18  Height:      Weight:  134.6 kg (296 lb 11.8 oz)    SpO2: 96%   97%    Intake/Output Summary (Last 24 hours) at 01/08/12 0957 Last data filed at 01/08/12 0800  Gross per 24 hour  Intake 2468.96 ml  Output   3595 ml  Net -1126.04 ml    LABS: Basic Metabolic Panel:  Basename 01/08/12 0159 01/07/12 0509  NA 138 140  K 3.7 4.4  CL 96 99  CO2 35* 34*  GLUCOSE 107* 122*  BUN 21 20  CREATININE 1.32 1.38*  CALCIUM 8.6 9.0  MG -- --  PHOS -- --   Liver Function Tests:  Basename 01/06/12 1336  AST 21  ALT 18  ALKPHOS 65  BILITOT 0.4  PROT 7.1  ALBUMIN 3.6   No results found for this basename: LIPASE:2,AMYLASE:2 in the last 72 hours CBC:  Basename 01/08/12 0159 01/07/12 0509 01/06/12 1336  WBC 8.4 9.2 --  NEUTROABS -- -- 5.2  HGB 13.1 13.2 --  HCT 40.6 41.2 --  MCV 96.7  96.9 --  PLT 159 180 --   Cardiac Enzymes:  Basename 01/07/12 1111 01/07/12 0509 01/06/12 2254  CKTOTAL 895* 808* 641*  CKMB 50.0* 61.8* 52.0*  CKMBINDEX -- -- --  TROPONINI 12.30* 9.81* 6.65*   BNP: No components found with this basename: POCBNP:3 D-Dimer:  Basename 01/06/12 1709  DDIMER 0.30   Hemoglobin A1C:  Basename 01/07/12 0509  HGBA1C 5.7*   Fasting Lipid Panel: No results found for this basename: CHOL,HDL,LDLCALC,TRIG,CHOLHDL,LDLDIRECT in the last 72 hours Thyroid Function Tests: No results found for this basename: TSH,T4TOTAL,FREET3,T3FREE,THYROIDAB in the last 72 hours Anemia Panel: No results found for this basename: VITAMINB12,FOLATE,FERRITIN,TIBC,IRON,RETICCTPCT in the last 72 hours  RADIOLOGY: Dg Chest Port 1 View  01/06/2012  *RADIOLOGY REPORT*  Clinical Data: Chest pain.  Numbness.  Short of breath.  PORTABLE CHEST - 1 VIEW  Comparison: 10/11/2011.  Findings: Apical lordotic projection. Cardiomegaly.  CABG.  Low volume chest with basilar atelectasis.  No gross opacification.  IMPRESSION: Low volume chest and cardiomegaly.  No gross evidence of failure.  Original Report Authenticated By: Andreas Newport, M.D.    PHYSICAL EXAM General: NAD, obese.  Neck: Thick, JVP 8-9 cm, no thyromegaly or thyroid nodule.  Lungs: Slight crackles at bases bilaterally.  CV: Nondisplaced PMI.  Heart regular S1/S2, no S3/S4, no murmur.  1+ ankle edema bilaterally.  No carotid bruit.  Abdomen: Soft, nontender, no hepatosplenomegaly, no distention.  Neurologic: Alert and oriented x 3.  Psych: Normal affect. Extremities: No clubbing or cyanosis.   TELEMETRY: Reviewed telemetry pt in NSR  ASSESSMENT AND PLAN:  78 yo with history of COPD on home oxygen and CAD s/p CABG presented with NSTEMI and acute on chronic diastolic CHF.  1. CAD: s/p NSTEMI.  Plan for PCI to SVG-ramus and native RCA on Monday if creatinine remains stable.  No further chest pain.  Continue ASA, Plavix,  heparin gtt, increase atorvastatin to 80.  2. Diastolic CHF: Acute on chronic.  LVEDP 39 at cath, EF 60% on LV-gram.  Continue Lasix 40 mg IV bid, still some volume overload.  Creatinine  remains stable at 1.3.  3. COPD: Stable on oxygen.  4. Renal: CKD.  Stable at 1.3.   [name] 01/08/2012 10:01 AM

## 2012-01-08 NOTE — Progress Notes (Signed)
Pt's eyes pink with crust per Phelbotomist, observed pt's eyes to be pink tinged with crust, pt's eyes cleaned,will cont to monitor.   Heparin gtt increased per pharm, shortly thereafter observed eyes to be a little more red, On call MD (Dr. Antoine Poche) notified, no new orders. Will cont to monitor.

## 2012-01-08 NOTE — Progress Notes (Signed)
ANTICOAGULATION CONSULT NOTE - Follow Up Consult  Pharmacy Consult for heparin Indication: NSTEMI  Labs:  Basename 01/08/12 1217 01/08/12 0159 01/07/12 1111 01/07/12 0509 01/07/12 0140 01/06/12 2254 01/06/12 1336  HGB -- 13.1 -- 13.2 -- -- --  HCT -- 40.6 -- 41.2 40.8 -- --  PLT -- 159 -- 180 174 -- --  APTT -- -- -- -- -- -- 28  LABPROT -- -- -- -- -- -- 13.5  INR -- -- -- -- -- -- 1.01  HEPARINUNFRC 0.23* 0.17* -- -- 0.29* -- --  CREATININE 1.30 1.32 -- 1.38* -- -- --  CKTOTAL -- -- 895* 808* -- 641* --  CKMB -- -- 50.0* 61.8* -- 52.0* --  TROPONINI -- -- 12.30* 9.81* -- 6.65* --    Assessment: 81yoM on IV heparin after resumed post-cath, now awaiting PCI.  Heparin level subtherapeutic, though trending up.  No bleeding noted. CBC ok.  Renal fxn ok.  Hep dosing weight = 106kg.  Pt also noted to be on full dose aspirin and plavix.    Goal of Therapy:  Heparin level 0.3-0.7 units/ml   Plan:  Increase heparin gtt to 1800 units/hr and check level in 8hr @ 2300.  F/u on decreasing ASA to 81mg .    Haynes Hoehn E PharmD BCPS 01/08/2012,2:46 PM

## 2012-01-08 NOTE — Progress Notes (Signed)
CARDIAC REHAB PHASE I   PRE:  Rate/Rhythm: 69 NSR  BP:  Supine:   Sitting: 106/57  Standing:    SaO2: 96% 3L   MODE:  Ambulation: 75 ft   POST:  Rate/Rhythem: 66 NSR  BP:  Supine:   Sitting: 124/62  Standing:    SaO2: 100 3L   Patient ambulated x2. Patient c/o dizzy and heavy head. Back pain due to sitting for prolonged times. Patients gait steady. VSS. Patient felt weak so stopped and turned around and went back to room. Patient requested to lie down. Call bell in reach and RN notified.  [phone]  BROWN, Sylas Twombly L

## 2012-01-08 NOTE — Progress Notes (Signed)
ANTICOAGULATION CONSULT NOTE - Follow Up Consult  Pharmacy Consult for heparin Indication: NSTEMI  Labs:  Basename 01/08/12 0159 01/07/12 1111 01/07/12 0509 01/07/12 0140 01/06/12 2254 01/06/12 1336  HGB 13.1 -- 13.2 -- -- --  HCT 40.6 -- 41.2 40.8 -- --  PLT 159 -- 180 174 -- --  APTT -- -- -- -- -- 28  LABPROT -- -- -- -- -- 13.5  INR -- -- -- -- -- 1.01  HEPARINUNFRC 0.17* -- -- 0.29* -- --  CREATININE 1.32 -- 1.38* -- -- 1.32  CKTOTAL -- 895* 808* -- 641* --  CKMB -- 50.0* 61.8* -- 52.0* --  TROPONINI -- 12.30* 9.81* -- 6.65* --    Assessment: 78yo male subtherapeutic on heparin after resumed post-cath, now awaiting PCI.  Goal of Therapy:  Heparin level 0.3-0.7 units/ml   Plan:  Will increase heparin gtt to 1600 units/hr and check level in 8hr.  Colleen Can PharmD BCPS 01/08/2012,3:40 AM

## 2012-01-09 LAB — BASIC METABOLIC PANEL
Calcium: 8.7 mg/dL (ref 8.4–10.5)
GFR calc Af Amer: 67 mL/min — ABNORMAL LOW (ref >90)
GFR calc non Af Amer: 57 mL/min — ABNORMAL LOW (ref >90)
Glucose, Bld: 97 mg/dL (ref 70–99)
Potassium: 4.5 mEq/L (ref 3.5–5.1)
Sodium: 136 mEq/L (ref 135–145)

## 2012-01-09 LAB — CBC
Hemoglobin: 13.8 g/dL (ref 13.0–17.0)
MCH: 31.7 pg (ref 26.0–34.0)
MCHC: 32.7 g/dL (ref 30.0–36.0)
Platelets: 146 10*3/uL — ABNORMAL LOW (ref 150–400)
RBC: 4.35 MIL/uL (ref 4.22–5.81)

## 2012-01-09 LAB — HEPARIN LEVEL (UNFRACTIONATED): Heparin Unfractionated: 0.54 IU/mL (ref 0.30–0.70)

## 2012-01-09 MED ORDER — SODIUM CHLORIDE 0.9 % IJ SOLN: 3.0000 mL | INTRAMUSCULAR | Status: DC | PRN

## 2012-01-09 MED ORDER — FUROSEMIDE 40 MG PO TABS
40.0000 mg | ORAL_TABLET | Freq: Two times a day (BID) | ORAL | Status: DC
  Administered 2012-01-09 – 2012-01-11 (×3): 40 mg via ORAL
  Filled 2012-01-09 (×4): qty 1

## 2012-01-09 MED ORDER — SODIUM CHLORIDE 0.9 % IV SOLN: 250.0000 mL | INTRAVENOUS | Status: DC | PRN

## 2012-01-09 MED ORDER — ASPIRIN 81 MG PO CHEW
324.0000 mg | CHEWABLE_TABLET | ORAL | Status: AC
  Administered 2012-01-10: 324 mg via ORAL
  Filled 2012-01-09: qty 4

## 2012-01-09 MED ORDER — SODIUM CHLORIDE 0.9 % IJ SOLN: 3.0000 mL | Freq: Two times a day (BID) | INTRAMUSCULAR | Status: DC

## 2012-01-09 MED ORDER — ASPIRIN 325 MG PO TABS
325.0000 mg | ORAL_TABLET | Freq: Every day | ORAL | Status: DC
  Administered 2012-01-11: 325 mg via ORAL
  Filled 2012-01-09: qty 1

## 2012-01-09 MED ORDER — BIOTENE DRY MOUTH MT LIQD
15.0000 mL | Freq: Two times a day (BID) | OROMUCOSAL | Status: DC
  Administered 2012-01-10 – 2012-01-11 (×2): 15 mL via OROMUCOSAL

## 2012-01-09 NOTE — Plan of Care (Signed)
Problem: Consults Goal: Cardiac Cath Patient Education (See Patient Education module for education specifics.) Outcome: Completed/Met Date Met:  01/09/12 Pt has had prior cath; did not watch video for PCI; pt verbalizes understanding of procedure

## 2012-01-09 NOTE — Progress Notes (Signed)
ANTICOAGULATION CONSULT NOTE - Follow Up Consult  Pharmacy Consult for heparin Indication: NSTEMI  Labs:  Basename 01/08/12 2250 01/08/12 1217 01/08/12 0159 01/07/12 1111 01/07/12 0509 01/07/12 0140 01/06/12 2254 01/06/12 1336  HGB -- -- 13.1 -- 13.2 -- -- --  HCT -- -- 40.6 -- 41.2 40.8 -- --  PLT -- -- 159 -- 180 174 -- --  APTT -- -- -- -- -- -- -- 28  LABPROT -- -- -- -- -- -- -- 13.5  INR -- -- -- -- -- -- -- 1.01  HEPARINUNFRC 0.32 0.23* 0.17* -- -- -- -- --  CREATININE -- 1.30 1.32 -- 1.38* -- -- --  CKTOTAL -- -- -- 895* 808* -- 641* --  CKMB -- -- -- 50.0* 61.8* -- 52.0* --  TROPONINI -- -- -- 12.30* 9.81* -- 6.65* --    Assessment/Plan: 78yo male now therapeutic on heparin after multiple rate increases, awaiting PCI.  Will continue gtt at current rate and confirm stable with am labs.  Colleen Can PharmD BCPS 01/09/2012,12:10 AM

## 2012-01-09 NOTE — Progress Notes (Signed)
ANTICOAGULATION CONSULT NOTE - Follow Up Consult  Pharmacy Consult for heparin Indication: NSTEMI  Labs:  Basename 01/09/12 0907 01/09/12 0605 01/08/12 2250 01/08/12 1217 01/08/12 0159 01/07/12 1111 01/07/12 0509 01/06/12 2254 01/06/12 1336  HGB -- 13.8 -- -- 13.1 -- -- -- --  HCT -- 42.2 -- -- 40.6 -- 41.2 -- --  PLT -- 146* -- -- 159 -- 180 -- --  APTT -- -- -- -- -- -- -- -- 28  LABPROT -- -- -- -- -- -- -- -- 13.5  INR -- -- -- -- -- -- -- -- 1.01  HEPARINUNFRC 0.54 -- 0.32 0.23* -- -- -- -- --  CREATININE -- 1.17 -- 1.30 1.32 -- -- -- --  CKTOTAL -- -- -- -- -- 895* 808* 641* --  CKMB -- -- -- -- -- 50.0* 61.8* 52.0* --  TROPONINI -- -- -- -- -- 12.30* 9.81* 6.65* --    Assessment: 81yoM with NSTEMI, acute on chronic HF now s/p cath and  IV heparin resumed; planning staged PCI on Monday. HL therapeutic. Other meds: asa 325, plavix. Hgb ok, plts trending down. No bleeding noted. CrCl ~70 ml/min.   Goal of Therapy:  Heparin level 0.3-0.7 units/ml   Plan:  Continue heparin gtt at 1800 units/hr.  F/u daily CBC, heparin levels.  F/u on ASA dose.    Wynonia Hazard PharmD BCPS 01/09/2012,10:38 AM

## 2012-01-09 NOTE — Progress Notes (Signed)
Patient ID: THUNDER BRIDGEWATER, male   DOB: 22-Oct-1930, 78 y.o.   MRN: [id number]     SUBJECTIVE: Doing well this morning.  No chest pain, no dyspnea.    Current Facility-Administered Medications  Medication Dose Route Frequency Provider Last Rate Last Dose  . 0.9 %  sodium chloride infusion  250 mL Intravenous PRN Gery Pray, PA-C 10 mL/hr at 01/06/12 2000 250 mL at 01/06/12 2000  . acetaminophen (TYLENOL) tablet 650 mg  650 mg Oral Q4H PRN Roger A Arguello, PA-C      . albuterol (PROVENTIL HFA;VENTOLIN HFA) 108 (90 BASE) MCG/ACT inhaler 2 puff  2 puff Inhalation Q4H PRN Roger A Arguello, PA-C      . ALPRAZolam Prudy Feeler) tablet 0.25 mg  0.25 mg Oral BID PRN Gery Pray, PA-C      . aspirin tablet 325 mg  325 mg Oral Daily Roger A Arguello, PA-C   325 mg at 01/09/12 0913  . atorvastatin (LIPITOR) tablet 80 mg  80 mg Oral QHS Laurey Morale, MD   80 mg at 01/08/12 2210  . clopidogrel (PLAVIX) tablet 75 mg  75 mg Oral Q breakfast Duke Salvia, MD   75 mg at 01/09/12 0806  . furosemide (LASIX) tablet 40 mg  40 mg Oral BID Laurey Morale, MD      . heparin ADULT infusion 100 units/mL (25000 units/250 mL)  1,800 Units/hr Intravenous Continuous Riccardo Dubin Summe, PHARMD 18 mL/hr at 01/09/12 0453 1,800 Units/hr at 01/09/12 0453  . hydrALAZINE (APRESOLINE) tablet 25 mg  25 mg Oral BID Roger A Arguello, PA-C   25 mg at 01/09/12 0913  . isosorbide mononitrate (IMDUR) 24 hr tablet 30 mg  30 mg Oral Daily Roger A Arguello, PA-C   30 mg at 01/08/12 1714  . losartan (COZAAR) tablet 100 mg  100 mg Oral Daily Roger A Arguello, PA-C   100 mg at 01/09/12 0914  . morphine 4 MG/ML injection 4 mg  4 mg Intravenous Q4H PRN Gery Pray, PA-C   4 mg at 01/06/12 1716  . Nebivolol HCl TABS 20 mg  20 mg Oral Daily Roger A Arguello, PA-C   20 mg at 01/09/12 0913  . nitroGLYCERIN (NITROSTAT) SL tablet 0.4 mg  0.4 mg Sublingual Q5 min PRN Roger A Arguello, PA-C      . nitroGLYCERIN 0.2 mg/mL in dextrose 5 %  infusion  2-200 mcg/min Intravenous Titrated Pelbreton C. Balfour, MD 1.5 mL/hr at 01/07/12 2339 5 mcg/min at 01/07/12 2339  . ondansetron (ZOFRAN) injection 4 mg  4 mg Intravenous Q6H PRN Gery Pray, PA-C   4 mg at 01/06/12 2212  . potassium chloride SA (K-DUR,KLOR-CON) CR tablet 20 mEq  20 mEq Oral Daily Roger A Arguello, PA-C   20 mEq at 01/09/12 0913  . sodium chloride 0.9 % injection 3 mL  3 mL Intravenous Q12H Roger A Arguello, PA-C   3 mL at 01/09/12 0914  . sodium chloride 0.9 % injection 3 mL  3 mL Intravenous PRN Roger A Arguello, PA-C      . zolpidem (AMBIEN) tablet 5 mg  5 mg Oral QHS PRN Roger A Arguello, PA-C      . DISCONTD: 0.9 %  sodium chloride infusion   Intravenous Continuous Peter M [place], MD 50 mL/hr at 01/09/12 0320    . DISCONTD: 0.9 %  sodium chloride infusion   Intravenous Continuous Peter M [place], MD      . DISCONTD:  furosemide (LASIX) injection 40 mg  40 mg Intravenous Q12H Peter M [place], MD   40 mg at 01/09/12 0914  heparin gtt NTG gtt at 5    Filed Vitals:   01/09/12 0800 01/09/12 0808 01/09/12 0900 01/09/12 1000  BP:      Pulse: 58  69   Temp:  98.5 F (36.9 C)    TempSrc:  Oral    Resp: 19  15 24   Height:      Weight:      SpO2: 97%  95%     Intake/Output Summary (Last 24 hours) at 01/09/12 1016 Last data filed at 01/09/12 1000  Gross per 24 hour  Intake 2343.5 ml  Output   3300 ml  Net -956.5 ml    LABS: Basic Metabolic Panel:  Basename 01/09/12 0605 01/08/12 1217  NA 136 140  K 4.5 4.2  CL 97 96  CO2 29 38*  GLUCOSE 97 89  BUN 18 19  CREATININE 1.17 1.30  CALCIUM 8.7 8.6  MG -- --  PHOS -- --   Liver Function Tests:  Basename 01/06/12 1336  AST 21  ALT 18  ALKPHOS 65  BILITOT 0.4  PROT 7.1  ALBUMIN 3.6   No results found for this basename: LIPASE:2,AMYLASE:2 in the last 72 hours CBC:  Basename 01/09/12 0605 01/08/12 0159 01/06/12 1336  WBC 8.7 8.4 --  NEUTROABS -- -- 5.2  HGB 13.8 13.1 --  HCT 42.2 40.6 --    MCV 97.0 96.7 --  PLT 146* 159 --   Cardiac Enzymes:  Basename 01/07/12 1111 01/07/12 0509 01/06/12 2254  CKTOTAL 895* 808* 641*  CKMB 50.0* 61.8* 52.0*  CKMBINDEX -- -- --  TROPONINI 12.30* 9.81* 6.65*   BNP: No components found with this basename: POCBNP:3 D-Dimer:  Basename 01/06/12 1709  DDIMER 0.30   Hemoglobin A1C:  Basename 01/07/12 0509  HGBA1C 5.7*   Fasting Lipid Panel: No results found for this basename: CHOL,HDL,LDLCALC,TRIG,CHOLHDL,LDLDIRECT in the last 72 hours Thyroid Function Tests: No results found for this basename: TSH,T4TOTAL,FREET3,T3FREE,THYROIDAB in the last 72 hours Anemia Panel: No results found for this basename: VITAMINB12,FOLATE,FERRITIN,TIBC,IRON,RETICCTPCT in the last 72 hours  RADIOLOGY: Dg Chest Port 1 View  01/06/2012  *RADIOLOGY REPORT*  Clinical Data: Chest pain.  Numbness.  Short of breath.  PORTABLE CHEST - 1 VIEW  Comparison: 10/11/2011.  Findings: Apical lordotic projection. Cardiomegaly.  CABG.  Low volume chest with basilar atelectasis.  No gross opacification.  IMPRESSION: Low volume chest and cardiomegaly.  No gross evidence of failure.  Original Report Authenticated By: Andreas Newport, M.D.    PHYSICAL EXAM General: NAD, obese.  Neck: Thick, JVP 7-8 cm, no thyromegaly or thyroid nodule.  Lungs: Slight crackles at bases bilaterally.  CV: Nondisplaced PMI.  Heart regular S1/S2, no S3/S4, no murmur.  Trace ankle edema bilaterally.  No carotid bruit.  Abdomen: Soft, nontender, no hepatosplenomegaly, no distention.  Neurologic: Alert and oriented x 3.  Psych: Normal affect. Extremities: No clubbing or cyanosis.   TELEMETRY: Reviewed telemetry pt in NSR  ASSESSMENT AND PLAN:  78 yo with history of COPD on home oxygen and CAD s/p CABG presented with NSTEMI and acute on chronic diastolic CHF.  1. CAD: s/p NSTEMI.  Plan for PCI to SVG-ramus and native RCA on Monday.  No further chest pain.  Continue ASA, Plavix, heparin gtt,  statin.  2. Diastolic CHF: Acute on chronic.  LVEDP 39 at cath, EF 60% on LV-gram.  He has diuresed well,  and volume looks better. Transition to po Lasix, will hold am dose pre-cath.   3. COPD: Stable on oxygen.  4. Renal: CKD.  Lower at 1.12.   [name] 01/09/2012 10:16 AM

## 2012-01-10 ENCOUNTER — Encounter (HOSPITAL_COMMUNITY)
Admission: EM | Disposition: A | Discharge status: Home / Self Care | Payer: Self-pay | Source: Home / Self Care | Attending: Internal Medicine

## 2012-01-10 DIAGNOSIS — I25810 Atherosclerosis of coronary artery bypass graft(s) without angina pectoris: Secondary | ICD-10-CM

## 2012-01-10 DIAGNOSIS — I214 Non-ST elevation (NSTEMI) myocardial infarction: Secondary | ICD-10-CM

## 2012-01-10 HISTORY — PX: PERCUTANEOUS CORONARY STENT INTERVENTION (PCI-S): SHX5485

## 2012-01-10 LAB — CBC
HCT: 41.8 % (ref 39.0–52.0)
MCH: 31.3 pg (ref 26.0–34.0)
MCHC: 32.8 g/dL (ref 30.0–36.0)
MCV: 95.4 fL (ref 78.0–100.0)
Platelets: 175 10*3/uL (ref 150–400)
RDW: 13.3 % (ref 11.5–15.5)

## 2012-01-10 LAB — HEPARIN LEVEL (UNFRACTIONATED): Heparin Unfractionated: 0.44 IU/mL (ref 0.30–0.70)

## 2012-01-10 LAB — BASIC METABOLIC PANEL
BUN: 19 mg/dL (ref 6–23)
Calcium: 9.1 mg/dL (ref 8.4–10.5)
Creatinine, Ser: 1.33 mg/dL (ref 0.50–1.35)
GFR calc Af Amer: 57 mL/min — ABNORMAL LOW (ref >90)

## 2012-01-10 LAB — POCT ACTIVATED CLOTTING TIME: Activated Clotting Time: 364 seconds

## 2012-01-10 SURGERY — LEFT HEART CATH
Anesthesia: Moderate Sedation

## 2012-01-10 SURGERY — PERCUTANEOUS CORONARY STENT INTERVENTION (PCI-S)
Anesthesia: Moderate Sedation

## 2012-01-10 SURGERY — PERCUTANEOUS CORONARY STENT INTERVENTION (PCI-S)
Anesthesia: LOCAL

## 2012-01-10 MED ORDER — LIDOCAINE HCL (PF) 1 % IJ SOLN
INTRAMUSCULAR | Status: AC
  Filled 2012-01-10: qty 30

## 2012-01-10 MED ORDER — BIVALIRUDIN 250 MG IV SOLR
INTRAVENOUS | Status: AC
  Filled 2012-01-10: qty 250

## 2012-01-10 MED ORDER — HEPARIN (PORCINE) IN NACL 2-0.9 UNIT/ML-% IJ SOLN
INTRAMUSCULAR | Status: AC
  Filled 2012-01-10: qty 2000

## 2012-01-10 MED ORDER — SODIUM CHLORIDE 0.9 % IV SOLN
0.2500 mg/kg/h | INTRAVENOUS | Status: AC
  Administered 2012-01-10: 0.25 mg/kg/h via INTRAVENOUS
  Filled 2012-01-10: qty 250

## 2012-01-10 MED ORDER — MIDAZOLAM HCL 2 MG/2ML IJ SOLN
INTRAMUSCULAR | Status: AC
  Filled 2012-01-10: qty 2

## 2012-01-10 MED ORDER — FENTANYL CITRATE 0.05 MG/ML IJ SOLN
INTRAMUSCULAR | Status: AC
  Filled 2012-01-10: qty 2

## 2012-01-10 MED ORDER — NITROGLYCERIN 0.2 MG/ML ON CALL CATH LAB
INTRAVENOUS | Status: AC
  Filled 2012-01-10: qty 1

## 2012-01-10 MED ORDER — VERAPAMIL HCL 2.5 MG/ML IV SOLN
INTRAVENOUS | Status: AC
  Filled 2012-01-10: qty 2

## 2012-01-10 MED ORDER — SODIUM CHLORIDE 0.9 % IV SOLN
1.0000 mL/kg/h | INTRAVENOUS | Status: AC
  Administered 2012-01-10: 1 mL/kg/h via INTRAVENOUS

## 2012-01-10 NOTE — Interval H&P Note (Signed)
History and Physical Interval Note:  01/10/2012 7:55 AM  [name]  has presented today for surgery, with the diagnosis of NSTEMI  The various methods of treatment have been discussed with the patient and family. After consideration of risks, benefits and other options for treatment, the patient has consented to  Procedure(s) (LRB): PERCUTANEOUS CORONARY STENT INTERVENTION (PCI-S) (N/A) as a surgical intervention .  The patient's history has been reviewed, patient examined, no change in status, stable for surgery.  I have reviewed the patients' chart and labs.  Questions were answered to the patient's satisfaction.     Theron Arista [facility] 01/10/2012 7:56 AM

## 2012-01-10 NOTE — Progress Notes (Signed)
TR BAND REMOVAL  LOCATION:    right radial  DEFLATED PER PROTOCOL:    yes  TIME BAND OFF / DRESSING APPLIED:    1330   SITE UPON ARRIVAL:    Level 0  SITE AFTER BAND REMOVAL:    Level 0  REVERSE ALLEN'S TEST:     positive  CIRCULATION SENSATION AND MOVEMENT:    Within Normal Limits   yes  COMMENTS:   Tolerated procedure well 

## 2012-01-10 NOTE — CV Procedure (Signed)
   CARDIAC CATH NOTE  Name: [name] MRN: [id number] DOB: August 28, 1930  Procedure: PTCA and stenting of the mid RCA and the saphenous vein graft to the intermediate.  Indication: 78 year old white male with history of coronary disease status post prior coronary bypass surgery. He presented with a non-ST elevation myocardial infarction. Diagnostic angiography demonstrated a high-grade stenosis in the mid right coronary. There was also a complex moderate to severe stenosis in the saphenous vein graft to the intermediate. He was brought back for PCI.  Procedural Details: The right wrist was prepped, draped, and anesthetized with 1% lidocaine. Using the modified Seldinger technique, a 6 Fr sheath was introduced into the radial artery. 3 mg verapamil was administered through the radial sheath. Weight-based bivalirudin was given for anticoagulation. Once a therapeutic ACT was achieved, a 6 [place] FR4 guide catheter was inserted.  A  pro-water coronary guidewire was used to cross the lesion.  The lesion was predilated with a  2.5 mm  balloon.  The lesion was then stented with a  3.0 x 16 mm Promus  stent.  The stent was postdilated with a  3.0 mm  noncompliant balloon.  Following PCI, there was 0% residual stenosis and TIMI-3 flow. Final angiography confirmed an excellent result. The patient tolerated the procedure well.   We next attempted the saphenous vein graft to the intermediate. Because of significant tortuosity of the right innominate vessel we were unable to find a guide to sit in the vein graft. We proceeded with femoral access. The right femoral groin was anesthetized with 1% lidocaine. Using a modified Seldinger technique a 6 French sheath was introduced into the right femoral artery. A 6 French left Amplatz 1 guide was inserted. Using a 3.5 mm filter wire we crossed the lesion. The filter was deployed in the distal vein graft. The lesion was then primarily stented with a 4.0 x 28 mm Promus  stent. This was dilated to 12 with the stent balloon. Following PCI, there was 0% residual stenosis and TIMI grade 3 flow. Final angiography confirmed an excellent result. The filter wire was retrieved without difficulty. Patient tolerated the procedure well. There were no immediate procedural complications. A TR band was used for radial hemostasis.  femoral hemostasis was obtained with manual compression. The patient was transferred to the post catheterization recovery area for further monitoring.  Lesion Data: Vessel:  mid RCA  Percent stenosis (pre):  90% TIMI-flow (pre):   3 Stent:   3.0 x 16 mm Promus Percent stenosis (post):  0% TIMI-flow (post): 3  Vessel #2: Saphenous vein graft to the intermediate Percent stenosis (pre-) 80% TIMI flow (pre-) 3 Stent: 4.0 x 28 mm Promus Percent stenoses (post ) 0% TIMI flow (post ) 3   Conclusions:  1. Successful intracoronary stenting of the mid right coronary with a DES. 2. Successful intracoronary stenting of the saphenous vein graft to the intermediate vessel with a DES.  Recommendations: Continue dual antiplatelet therapy for one year. Anticipate discharge in the morning if stable.   Theron Arista [facility] 01/10/2012, 9:17 AM

## 2012-01-10 NOTE — H&P (View-Only) (Signed)
Patient ID: [name], male   DOB: 01/19/1931, 78 y.o.   MRN: [id number]     SUBJECTIVE: Doing well this morning.  No chest pain, no dyspnea.    Current Facility-Administered Medications  Medication Dose Route Frequency Provider Last Rate Last Dose  . 0.9 %  sodium chloride infusion  250 mL Intravenous PRN Roger A Arguello, PA-C 10 mL/hr at 01/06/12 2000 250 mL at 01/06/12 2000  . acetaminophen (TYLENOL) tablet 650 mg  650 mg Oral Q4H PRN Roger A Arguello, PA-C      . albuterol (PROVENTIL HFA;VENTOLIN HFA) 108 (90 BASE) MCG/ACT inhaler 2 puff  2 puff Inhalation Q4H PRN Roger A Arguello, PA-C      . ALPRAZolam (XANAX) tablet 0.25 mg  0.25 mg Oral BID PRN Roger A Arguello, PA-C      . aspirin tablet 325 mg  325 mg Oral Daily Roger A Arguello, PA-C   325 mg at 01/09/12 0913  . atorvastatin (LIPITOR) tablet 80 mg  80 mg Oral QHS Siona Coulston S Barbaraann Avans, MD   80 mg at 01/08/12 2210  . clopidogrel (PLAVIX) tablet 75 mg  75 mg Oral Q breakfast Steven C Klein, MD   75 mg at 01/09/12 0806  . furosemide (LASIX) tablet 40 mg  40 mg Oral BID Brigham Cobbins S Morningstar Toft, MD      . heparin ADULT infusion 100 units/mL (25000 units/250 mL)  1,800 Units/hr Intravenous Continuous Colleen E Summe, PHARMD 18 mL/hr at 01/09/12 0453 1,800 Units/hr at 01/09/12 0453  . hydrALAZINE (APRESOLINE) tablet 25 mg  25 mg Oral BID Roger A Arguello, PA-C   25 mg at 01/09/12 0913  . isosorbide mononitrate (IMDUR) 24 hr tablet 30 mg  30 mg Oral Daily Roger A Arguello, PA-C   30 mg at 01/08/12 1714  . losartan (COZAAR) tablet 100 mg  100 mg Oral Daily Roger A Arguello, PA-C   100 mg at 01/09/12 0914  . morphine 4 MG/ML injection 4 mg  4 mg Intravenous Q4H PRN Roger A Arguello, PA-C   4 mg at 01/06/12 1716  . Nebivolol HCl TABS 20 mg  20 mg Oral Daily Roger A Arguello, PA-C   20 mg at 01/09/12 0913  . nitroGLYCERIN (NITROSTAT) SL tablet 0.4 mg  0.4 mg Sublingual Q5 min PRN Roger A Arguello, PA-C      . nitroGLYCERIN 0.2 mg/mL in dextrose 5 %  infusion  2-200 mcg/min Intravenous Titrated Pelbreton C. Balfour, MD 1.5 mL/hr at 01/07/12 2339 5 mcg/min at 01/07/12 2339  . ondansetron (ZOFRAN) injection 4 mg  4 mg Intravenous Q6H PRN Roger A Arguello, PA-C   4 mg at 01/06/12 2212  . potassium chloride SA (K-DUR,KLOR-CON) CR tablet 20 mEq  20 mEq Oral Daily Roger A Arguello, PA-C   20 mEq at 01/09/12 0913  . sodium chloride 0.9 % injection 3 mL  3 mL Intravenous Q12H Roger A Arguello, PA-C   3 mL at 01/09/12 0914  . sodium chloride 0.9 % injection 3 mL  3 mL Intravenous PRN Roger A Arguello, PA-C      . zolpidem (AMBIEN) tablet 5 mg  5 mg Oral QHS PRN Roger A Arguello, PA-C      . DISCONTD: 0.9 %  sodium chloride infusion   Intravenous Continuous Peter M Jordan, MD 50 mL/hr at 01/09/12 0320    . DISCONTD: 0.9 %  sodium chloride infusion   Intravenous Continuous Peter M Jordan, MD      . DISCONTD:   furosemide (LASIX) injection 40 mg  40 mg Intravenous Q12H Peter M Jordan, MD   40 mg at 01/09/12 0914  heparin gtt NTG gtt at 5    Filed Vitals:   01/09/12 0800 01/09/12 0808 01/09/12 0900 01/09/12 1000  BP:      Pulse: 58  69   Temp:  98.5 F (36.9 C)    TempSrc:  Oral    Resp: 19  15 24  Height:      Weight:      SpO2: 97%  95%     Intake/Output Summary (Last 24 hours) at 01/09/12 1016 Last data filed at 01/09/12 1000  Gross per 24 hour  Intake 2343.5 ml  Output   3300 ml  Net -956.5 ml    LABS: Basic Metabolic Panel:  Basename 01/09/12 0605 01/08/12 1217  NA 136 140  K 4.5 4.2  CL 97 96  CO2 29 38*  GLUCOSE 97 89  BUN 18 19  CREATININE 1.17 1.30  CALCIUM 8.7 8.6  MG -- --  PHOS -- --   Liver Function Tests:  Basename 01/06/12 1336  AST 21  ALT 18  ALKPHOS 65  BILITOT 0.4  PROT 7.1  ALBUMIN 3.6   No results found for this basename: LIPASE:2,AMYLASE:2 in the last 72 hours CBC:  Basename 01/09/12 0605 01/08/12 0159 01/06/12 1336  WBC 8.7 8.4 --  NEUTROABS -- -- 5.2  HGB 13.8 13.1 --  HCT 42.2 40.6 --    MCV 97.0 96.7 --  PLT 146* 159 --   Cardiac Enzymes:  Basename 01/07/12 1111 01/07/12 0509 01/06/12 2254  CKTOTAL 895* 808* 641*  CKMB 50.0* 61.8* 52.0*  CKMBINDEX -- -- --  TROPONINI 12.30* 9.81* 6.65*   BNP: No components found with this basename: POCBNP:3 D-Dimer:  Basename 01/06/12 1709  DDIMER 0.30   Hemoglobin A1C:  Basename 01/07/12 0509  HGBA1C 5.7*   Fasting Lipid Panel: No results found for this basename: CHOL,HDL,LDLCALC,TRIG,CHOLHDL,LDLDIRECT in the last 72 hours Thyroid Function Tests: No results found for this basename: TSH,T4TOTAL,FREET3,T3FREE,THYROIDAB in the last 72 hours Anemia Panel: No results found for this basename: VITAMINB12,FOLATE,FERRITIN,TIBC,IRON,RETICCTPCT in the last 72 hours  RADIOLOGY: Dg Chest Port 1 View  01/06/2012  *RADIOLOGY REPORT*  Clinical Data: Chest pain.  Numbness.  Short of breath.  PORTABLE CHEST - 1 VIEW  Comparison: 10/11/2011.  Findings: Apical lordotic projection. Cardiomegaly.  CABG.  Low volume chest with basilar atelectasis.  No gross opacification.  IMPRESSION: Low volume chest and cardiomegaly.  No gross evidence of failure.  Original Report Authenticated By: GEOFFREY LAMKE, M.D.    PHYSICAL EXAM General: NAD, obese.  Neck: Thick, JVP 7-8 cm, no thyromegaly or thyroid nodule.  Lungs: Slight crackles at bases bilaterally.  CV: Nondisplaced PMI.  Heart regular S1/S2, no S3/S4, no murmur.  Trace ankle edema bilaterally.  No carotid bruit.  Abdomen: Soft, nontender, no hepatosplenomegaly, no distention.  Neurologic: Alert and oriented x 3.  Psych: Normal affect. Extremities: No clubbing or cyanosis.   TELEMETRY: Reviewed telemetry pt in NSR  ASSESSMENT AND PLAN:  78 yo with history of COPD on home oxygen and CAD s/p CABG presented with NSTEMI and acute on chronic diastolic CHF.  1. CAD: s/p NSTEMI.  Plan for PCI to SVG-ramus and native RCA on Monday.  No further chest pain.  Continue ASA, Plavix, heparin gtt,  statin.  2. Diastolic CHF: Acute on chronic.  LVEDP 39 at cath, EF 60% on LV-gram.  He has diuresed well,   and volume looks better. Transition to po Lasix, will hold am dose pre-cath.   3. COPD: Stable on oxygen.  4. Renal: CKD.  Lower at 1.12.   Quention Mcneill 01/09/2012 10:16 AM   

## 2012-01-11 DIAGNOSIS — J449 Chronic obstructive pulmonary disease, unspecified: Secondary | ICD-10-CM

## 2012-01-11 DIAGNOSIS — I2510 Atherosclerotic heart disease of native coronary artery without angina pectoris: Secondary | ICD-10-CM

## 2012-01-11 DIAGNOSIS — I5033 Acute on chronic diastolic (congestive) heart failure: Secondary | ICD-10-CM

## 2012-01-11 LAB — CBC
HCT: 42.7 % (ref 39.0–52.0)
Hemoglobin: 13.6 g/dL (ref 13.0–17.0)
MCH: 30.7 pg (ref 26.0–34.0)
MCV: 96.4 fL (ref 78.0–100.0)
Platelets: 179 10*3/uL (ref 150–400)
RBC: 4.43 MIL/uL (ref 4.22–5.81)
WBC: 9.6 10*3/uL (ref 4.0–10.5)

## 2012-01-11 LAB — BASIC METABOLIC PANEL
CO2: 31 mEq/L (ref 19–32)
Calcium: 9.4 mg/dL (ref 8.4–10.5)
Chloride: 97 mEq/L (ref 96–112)
Creatinine, Ser: 1.32 mg/dL (ref 0.50–1.35)
Glucose, Bld: 110 mg/dL — ABNORMAL HIGH (ref 70–99)

## 2012-01-11 MED ORDER — FUROSEMIDE 40 MG PO TABS: 40.0000 mg | ORAL_TABLET | Freq: Two times a day (BID) | ORAL | Status: DC

## 2012-01-11 MED ORDER — CLOPIDOGREL BISULFATE 75 MG PO TABS: 75.0000 mg | ORAL_TABLET | Freq: Every day | ORAL | Status: DC

## 2012-01-11 MED ORDER — ATORVASTATIN CALCIUM 80 MG PO TABS: 80.0000 mg | ORAL_TABLET | Freq: Every day | ORAL | Status: DC

## 2012-01-11 MED ORDER — POTASSIUM CHLORIDE CRYS ER 20 MEQ PO TBCR: 20.0000 meq | EXTENDED_RELEASE_TABLET | Freq: Two times a day (BID) | ORAL | Status: DC

## 2012-01-11 MED ORDER — NITROGLYCERIN 0.4 MG SL SUBL: 0.4000 mg | SUBLINGUAL_TABLET | SUBLINGUAL | Status: DC | PRN

## 2012-01-11 MED FILL — Dextrose Inj 5%: INTRAVENOUS | Qty: 50 | Status: AC

## 2012-01-11 NOTE — Progress Notes (Signed)
Patient ID: [name], male   DOB: 1931-07-15, 78 y.o.   MRN: [id number]     SUBJECTIVE: Doing well this morning.  No chest pain, no dyspnea.  No groin or radial complications. Anxious to go home.  Current Facility-Administered Medications  Medication Dose Route Frequency Provider Last Rate Last Dose  . 0.9 %  sodium chloride infusion  250 mL Intravenous PRN Roger A Arguello, PA-C   250 mL at 01/06/12 2000  . 0.9 %  sodium chloride infusion  1 mL/kg/hr Intravenous Continuous Tempie Gibeault M [place], MD 132.1 mL/hr at 01/10/12 0957 1 mL/kg/hr at 01/10/12 0957  . acetaminophen (TYLENOL) tablet 650 mg  650 mg Oral Q4H PRN Roger A Arguello, PA-C      . albuterol (PROVENTIL HFA;VENTOLIN HFA) 108 (90 BASE) MCG/ACT inhaler 2 puff  2 puff Inhalation Q4H PRN Roger A Arguello, PA-C      . ALPRAZolam Prudy Feeler) tablet 0.25 mg  0.25 mg Oral BID PRN Roger A Arguello, PA-C      . antiseptic oral rinse (BIOTENE) solution 15 mL  15 mL Mouth Rinse BID Duke Salvia, MD   15 mL at 01/10/12 2000  . aspirin tablet 325 mg  325 mg Oral Daily Hessie Diener [place], [place]      . atorvastatin (LIPITOR) tablet 80 mg  80 mg Oral QHS Laurey Morale, MD   80 mg at 01/10/12 2229  . bivalirudin (ANGIOMAX) 250 MG injection           . bivalirudin (ANGIOMAX) 5 mg/mL in sodium chloride 0.9 % 50 mL infusion  0.25 mg/kg/hr Intravenous To Cath Teisha Trowbridge M [place], MD 6.6 mL/hr at 01/10/12 0809 0.25 mg/kg/hr at 01/10/12 0809  . clopidogrel (PLAVIX) tablet 75 mg  75 mg Oral Q breakfast Duke Salvia, MD   75 mg at 01/10/12 1302  . fentaNYL (SUBLIMAZE) 0.05 MG/ML injection           . furosemide (LASIX) tablet 40 mg  40 mg Oral BID Laurey Morale, MD   40 mg at 01/10/12 1736  . heparin 2-0.9 UNIT/ML-% infusion           . hydrALAZINE (APRESOLINE) tablet 25 mg  25 mg Oral BID Roger A Arguello, PA-C   25 mg at 01/10/12 2229  . isosorbide mononitrate (IMDUR) 24 hr tablet 30 mg  30 mg Oral Daily Roger A Arguello, PA-C   30 mg at 01/10/12 1737    . lidocaine (XYLOCAINE) 1 % injection           . losartan (COZAAR) tablet 100 mg  100 mg Oral Daily Roger A Arguello, PA-C   100 mg at 01/10/12 1302  . midazolam (VERSED) 2 MG/2ML injection           . midazolam (VERSED) 2 MG/2ML injection           . morphine 4 MG/ML injection 4 mg  4 mg Intravenous Q4H PRN Gery Pray, PA-C   4 mg at 01/06/12 1716  . Nebivolol HCl TABS 20 mg  20 mg Oral Daily Roger A Arguello, PA-C   20 mg at 01/10/12 1302  . nitroGLYCERIN (NITROSTAT) SL tablet 0.4 mg  0.4 mg Sublingual Q5 min PRN Roger A Arguello, PA-C      . nitroGLYCERIN (NTG ON-CALL) 0.2 mg/mL injection           . ondansetron (ZOFRAN) injection 4 mg  4 mg Intravenous Q6H PRN Gery Pray, PA-C  4 mg at 01/06/12 2212  . sodium chloride 0.9 % injection 3 mL  3 mL Intravenous Q12H Roger A Arguello, PA-C   3 mL at 01/10/12 2230  . sodium chloride 0.9 % injection 3 mL  3 mL Intravenous PRN Roger A Arguello, PA-C      . verapamil (ISOPTIN) 2.5 MG/ML injection           . zolpidem (AMBIEN) tablet 5 mg  5 mg Oral QHS PRN Roger A Arguello, PA-C      . DISCONTD: 0.9 %  sodium chloride infusion  250 mL Intravenous PRN Laurey Morale, MD      . DISCONTD: heparin ADULT infusion 100 units/mL (25000 units/250 mL)  1,800 Units/hr Intravenous Continuous Riccardo Dubin Summe, PHARMD   1,800 Units/hr at 01/09/12 2008  . DISCONTD: nitroGLYCERIN 0.2 mg/mL in dextrose 5 % infusion  2-200 mcg/min Intravenous Titrated Pelbreton C. Terressa Koyanagi, MD   5 mcg/min at 01/07/12 2339  . DISCONTD: sodium chloride 0.9 % injection 3 mL  3 mL Intravenous Q12H Laurey Morale, MD      . DISCONTD: sodium chloride 0.9 % injection 3 mL  3 mL Intravenous PRN Laurey Morale, MD          Filed Vitals:   01/10/12 1557 01/10/12 2030 01/11/12 0014 01/11/12 0440  BP: 120/61 108/54 114/59 137/66  Pulse: 69 69 75 79  Temp: 97.8 F (36.6 C) 98.2 F (36.8 C) 98.2 F (36.8 C) 98.3 F (36.8 C)  TempSrc: Oral Oral Oral Oral  Resp: 18 19 17 20    Height:      Weight:    135.5 kg (298 lb 11.6 oz)  SpO2: 97% 98% 98% 97%    Intake/Output Summary (Last 24 hours) at 01/11/12 1610 Last data filed at 01/11/12 0025  Gross per 24 hour  Intake   1000 ml  Output    925 ml  Net     75 ml    LABS: Basic Metabolic Panel:  Basename 01/11/12 0345 01/10/12 0510  NA 137 140  K 4.0 4.0  CL 97 98  CO2 31 33*  GLUCOSE 110* 94  BUN 21 19  CREATININE 1.32 1.33  CALCIUM 9.4 9.1  MG -- --  PHOS -- --    Basename 01/11/12 0345 01/10/12 0510  WBC 9.6 8.6  NEUTROABS -- --  HGB 13.6 13.7  HCT 42.7 41.8  MCV 96.4 95.4  PLT 179 175    RADIOLOGY: Dg Chest Port 1 View  01/06/2012  *RADIOLOGY REPORT*  Clinical Data: Chest pain.  Numbness.  Short of breath.  PORTABLE CHEST - 1 VIEW  Comparison: 10/11/2011.  Findings: Apical lordotic projection. Cardiomegaly.  CABG.  Low volume chest with basilar atelectasis.  No gross opacification.  IMPRESSION: Low volume chest and cardiomegaly.  No gross evidence of failure.  Original Report Authenticated By: Andreas Newport, M.D.    PHYSICAL EXAM General: NAD, obese.  Neck: Thick, JVP 4 cm, no thyromegaly or thyroid nodule.  Lungs: Clear.  CV: Nondisplaced PMI.  Heart regular S1/S2, no S3/S4, no murmur.  Trace ankle edema bilaterally.  No carotid bruit.  Abdomen: Soft, nontender, no hepatosplenomegaly, no distention.  Neurologic: Alert and oriented x 3.  Psych: Normal affect. Extremities: No clubbing or cyanosis.   TELEMETRY: Reviewed telemetry pt in NSR  ASSESSMENT AND PLAN:  78 yo with history of COPD on home oxygen and CAD s/p CABG presented with NSTEMI and acute on chronic diastolic CHF.  1.  CAD: s/p NSTEMI.  S/p PCI to SVG-ramus and native RCA yesterday with DES.  No further chest pain.  Continue ASA, Plavix, statin. Plan on discharge today. Follow up with Dr. Daleen Squibb as outpatient. 2. Diastolic CHF: Acute on chronic.  LVEDP 39 at cath, EF 60% on LV-gram.  He has diuresed well, and volume looks  better. On po Lasix now.  Renal function stable. 3. COPD: Stable on oxygen.  4. Renal: CKD. Stable at 1.3.   Theron Arista [facility] 01/11/2012 6:38 AM

## 2012-01-11 NOTE — Discharge Summary (Signed)
Patient seen and examined and history reviewed. Agree with above findings and plan. See rounding note earlier today.  Theron Arista [facility] 01/11/2012 12:35 PM

## 2012-01-11 NOTE — Discharge Summary (Signed)
Discharge Summary   Patient ID: [name],  MRN: [id number], DOB/AGE: [birth year] 78 y.o.  Admit date: 02-02-12 Discharge date: 01/11/2012  Discharge Diagnoses Principal Problem:  *NSTEMI (non-ST elevated myocardial infarction) Active Problems:  HYPERLIPIDEMIA-MIXED  HYPERTENSION, UNSPECIFIED  CAD, NATIVE VESSEL  CAD, ARTERY BYPASS GRAFT  Obstructive chronic bronchitis without exacerbation  Type 2 diabetes mellitus  Acute on chronic diastolic CHF (congestive heart failure)   Allergies No Known Allergies  Diagnostic Studies/Procedures  CHEST X-RAY- 02-02-12  Comparison: 10/11/2011.  Findings: Apical lordotic projection. Cardiomegaly. CABG. Low  volume chest with basilar atelectasis. No gross opacification.  IMPRESSION:  Low volume chest and cardiomegaly. No gross evidence of failure.  CARDIAC CATHETERIZATION- 01/07/12  Indication: 78 year old white male with history of morbid obesity, COPD, diabetes, hyperlipidemia, hypertension, and known coronary disease who presents with a non-ST elevation myocardial infarction. He had prior coronary bypass surgery in 1995 by Dr. Laneta Simmers.  Procedural details: The right groin was prepped, draped, and anesthetized with 1% lidocaine. Using modified Seldinger technique, a 5 French sheath was introduced into the right femoral artery. Standard Judkins catheters were used for coronary angiography and left ventriculography. Catheter exchanges were performed over a guidewire. There were no immediate procedural complications. The patient was transferred to the post catheterization recovery area for further monitoring.   Procedural Findings:  Hemodynamics:  AO 146/75 with a mean of 107 mmHg  LV 148/39 mmHg  Coronary angiography:  Coronary dominance: right  Left mainstem: There is 20-30% tapering of the distal left main.  Left anterior descending (LAD): There is a 90% stenosis in the proximal LAD. It is occluded after the first septal  perforator.  There is a large ramus branch which has a 95% ostial stenosis and then is occluded proximally. This is supplied by a vein graft.  Left circumflex (LCx): The left circumflex coronary traverses the AV groove. It gives rise to 3 very small marginal branches. There is a 50% stenosis in the mid circumflex.  Right coronary artery (RCA): The right coronary is a large dominant vessel. It has diffuse 30% disease in the proximal vessel. The mid vessel there is a 90% stenosis prior to the takeoff of 2 rather large right ventricular branches. Right coronary is occluded in the mid vessel.  The saphenous vein graft to the distal right coronary is widely patent and fills both the PDA and posterior lateral branches.  The saphenous vein graft to the large intermediate branch is patent. In the mid graft there is a complex aneurysmal stenosis 70-80%.  The LIMA graft to the LAD is difficult to directly cannulate. By flush shots we demonstrate that it is widely patent with good runoff to the LAD.  Left ventriculography: Left ventricular systolic function is normal, LVEF is estimated at 55-60%, there is mild midanterior wall hypokinesis, there is no significant mitral regurgitation   Final Conclusions:  1. Severe three-vessel obstructive atherosclerotic coronary disease.  2. Patent saphenous vein graft to the distal right coronary.  3. Patent saphenous vein graft to the intermediate branch with moderate to severe complex stenosis in the mid graft.  4. Patent LIMA graft to LAD.  5. Preserved LV function  CARDIAC CATHETERIZATION WITH PCI- 01/10/12  Indication: 78 year old white male with history of coronary disease status post prior coronary bypass surgery. He presented with a non-ST elevation myocardial infarction. Diagnostic angiography demonstrated a high-grade stenosis in the mid right coronary. There was also a complex moderate to severe stenosis in the saphenous vein graft to the  intermediate. He was  brought back for PCI.  Procedural Details: The right wrist was prepped, draped, and anesthetized with 1% lidocaine. Using the modified Seldinger technique, a 6 Fr sheath was introduced into the radial artery. 3 mg verapamil was administered through the radial sheath. Weight-based bivalirudin was given for anticoagulation. Once a therapeutic ACT was achieved, a 6 [place] FR4 guide catheter was inserted. A pro-water coronary guidewire was used to cross the lesion. The lesion was predilated with a 2.5 mm balloon. The lesion was then stented with a 3.0 x 16 mm Promus stent. The stent was postdilated with a 3.0 mm noncompliant balloon. Following PCI, there was 0% residual stenosis and TIMI-3 flow. Final angiography confirmed an excellent result. The patient tolerated the procedure well.  We next attempted the saphenous vein graft to the intermediate. Because of significant tortuosity of the right innominate vessel we were unable to find a guide to sit in the vein graft. We proceeded with femoral access. The right femoral groin was anesthetized with 1% lidocaine. Using a modified Seldinger technique a 6 French sheath was introduced into the right femoral artery. A 6 French left Amplatz 1 guide was inserted. Using a 3.5 mm filter wire we crossed the lesion. The filter was deployed in the distal vein graft. The lesion was then primarily stented with a 4.0 x 28 mm Promus stent. This was dilated to 12 with the stent balloon. Following PCI, there was 0% residual stenosis and TIMI grade 3 flow. Final angiography confirmed an excellent result. The filter wire was retrieved without difficulty. Patient tolerated the procedure well. There were no immediate procedural complications. A TR band was used for radial hemostasis. femoral hemostasis was obtained with manual compression. The patient was transferred to the post catheterization recovery area for further monitoring.   Lesion Data:  Vessel: mid RCA  Percent stenosis (pre):  90%  TIMI-flow (pre): 3  Stent: 3.0 x 16 mm Promus  Percent stenosis (post): 0%  TIMI-flow (post): 3  Vessel #2: Saphenous vein graft to the intermediate  Percent stenosis (pre-) 80%  TIMI flow (pre-) 3  Stent: 4.0 x 28 mm Promus  Percent stenoses (post ) 0%  TIMI flow (post ) 3   Conclusions:  1. Successful intracoronary stenting of the mid right coronary with a DES.  2. Successful intracoronary stenting of the saphenous vein graft to the intermediate vessel with a DES.  History of Present Illness  Mr. Livingood is a 78yo gentleman with the above problem list, including morbid obesity and history of syncope secondary to his severe COPD with hypoxia, who was admitted to [facility] on 01/06/12 with chest pain.  He had been following Dr. Delford Field with pulmonology regarding his severe COPD. He had complained of severe, atypical, precordial, intermittent chest pain prompting referral to [facility] cardiology for further evaluation given the patient's cardiac history and risk factors. He was seen in clinic in 09/2011, and scheduled for a Lexiscan Myoview which revealed no evidence of ischemia and preserved LV function. Additionally, a 2D echo was ordered revealing normal EF, no evidence of diastolic dysfunction, however moderate LA dilatation. No further ischemic evaluation was pursued.   He had continued to experience intermittent chest discomfort without association with exertion. The day of admission, he experienced constant SSCP with associated shortness of breath, relieved somewhat by NTG SL x 2 and oxygen supplementation. His shortness of breath improved, but chest pain persisted upon running some errands. His chest pain worsened, and he became nauseous  and diaphoretic. A passerby called EMS and he was transported to [facility] ED. He was given low-dose ASA x 4 en route.   Upon ED arrival, EKG revealed no evidence of acute ischemia. Initial troponin-i WNL. CXR without acute cardiopulmonary  process. He was placed on NTG gtt with some relief (4-5/10). Morphine IV was given x 3 with improvement to (1/10). He remained comfortable, in NAD. An additional set of cardiac biomarkers revealed mildly elevated troponin-I. Heparin IV was started and continued. Given the patient's history of CAD (CABG in 1995 previously, extending beyond expected patency time), elevated cardiac markers and risk factors, the patient was admitted. He remained comfortable, in NAD, and the plan was made to pursue cardiac catheterization the following day.   Hospital Course   Outpatient medications were continued. He experienced nausea and vomiting overnight relieved with Zofran. Cardiac biomarkers continued to up-trend. The patient complained of intermittent chest pain throughout the night which was relieved with NTG gtt up-titration. EKG was nonacute.   The following day, he was informed, consented and prepped for cardiac catheterization. This was accessed via the R femoral artery. The procedure is described in full above, and notably revealed severe 3v CAD, patent SVG-distal RCA/LIMA-LAD; SVG-intermediate branch with an aneurysmal 70-80% complex stenosis to mid graft; 90% mid RCA stenosis; LVEF 55-60% and LVEDP increased at 39. The recommendation was made to optimize medically, with added diuresis. Creatinine was mildly elevated that morning. The plan was for PCI on the following Monday with hopes that his renal function would improve.   He was started on Lasix IV. He diuresed well over the weekend. His symptoms remained stable. He remained heparinized. His respiratory status remained stable on oxygen. He ambulated over the weekend with exercise physiology. He did experience some dizziness/lightheadedness requiring him to rest and stop. He was transitioned to Lasix PO. Renal function improved.    On 01/10/12, he underwent PCI as described above including successful DES placement to mid RCA and SVG-intermediate vessel. The  recommendation was made to continue DAPT x 1 year. He tolerated the procedure well, without complications. He was transferred to CRU in good condition. There were no overnight events. Renal function and volume status remained stable (total I/O -4619.5 cc). He ambulated well with cardiac rehab (limited by severe COPD). He was evaluated today by Dr. [place] and found to be stable for discharge. He will continue the medications listed below. He will follow-up as scheduled below. He will have BMET drawn on follow-up to assess electrolytes given recent diuresis. This information, including activity restrictions, was clearly outlined in the discharge AVS.   Discharge Vitals:  Blood pressure 126/70, pulse 80, temperature 98.1 F (36.7 C), temperature source Oral, resp. rate 20, height 5\' 11"  (1.803 m), weight 135.5 kg (298 lb 11.6 oz), SpO2 97.00%.  Weight change: 3.4 kg (7 lb 7.9 oz)  Labs: Recent Labs  Basename 01/11/12 0345 01/10/12 0510   WBC 9.6 8.6   HGB 13.6 13.7   HCT 42.7 41.8   MCV 96.4 95.4   PLT 179 175    Lab 01/11/12 0345 01/10/12 0510 01/09/12 0605 01/06/12 1336  NA 137 140 136 --  K 4.0 4.0 4.5 --  CL 97 98 97 --  CO2 31 33* 29 --  BUN 21 19 18  --  CREATININE 1.32 1.33 1.17 --  CALCIUM 9.4 9.1 8.7 --  PROT -- -- -- 7.1  BILITOT -- -- -- 0.4  ALKPHOS -- -- -- 65  ALT -- -- --  18  AST -- -- -- 21  AMYLASE -- -- -- --  LIPASE -- -- -- --  GLUCOSE 110* 94 97 --   Disposition:  Discharge Orders    Future Appointments: Provider: Department: Dept Phone: Center:   01/17/2012 9:00 AM Ok Anis, NP [facility] [phone] [facility]   01/17/2012 11:20 AM [facility] [place] [phone] [facility]   06/13/2012 9:15 AM Edwyna Perfect, MD [facility] [phone] [facility]     Follow-up Information    Follow up with Nicolasa Ducking, NP on 01/17/2012. (At 9:00 AM for follow-up after this hospitalization. You will also have lab work done.  )    Solicitor information:   [address] [place] [address] [phone]          Discharge Medications:  Medication List  As of 01/11/2012 11:12 AM   START taking these medications         clopidogrel 75 MG tablet   Commonly known as: PLAVIX   Take 1 tablet (75 mg total) by mouth daily with breakfast.         CHANGE how you take these medications         atorvastatin 80 MG tablet   Commonly known as: LIPITOR   Take 1 tablet (80 mg total) by mouth at bedtime.   What changed: - medication strength - dose      furosemide 40 MG tablet   Commonly known as: LASIX   Take 1 tablet (40 mg total) by mouth 2 (two) times daily.   What changed: how often to take the med      nitroGLYCERIN 0.4 MG SL tablet   Commonly known as: NITROSTAT   Place 1 tablet (0.4 mg total) under the tongue every 5 (five) minutes as needed. Call 9-1-1 after taking your third.   What changed: doctor's instructions      potassium chloride SA 20 MEQ tablet   Commonly known as: K-DUR,KLOR-CON   Take 1 tablet (20 mEq total) by mouth 2 (two) times daily.   What changed: how often to take the med         CONTINUE taking these medications         ARTIFICIAL TEAR OP      aspirin 325 MG tablet      BONINE 25 MG Chew   Generic drug: Meclizine HCl      BYSTOLIC 20 MG Tabs   Generic drug: Nebivolol HCl      hydrALAZINE 25 MG tablet   Commonly known as: APRESOLINE      isosorbide mononitrate 30 MG 24 hr tablet   Commonly known as: IMDUR      losartan 100 MG tablet   Commonly known as: COZAAR      MULTIVITAMIN & MINERAL PO          Where to get your medications    These are the prescriptions that you need to pick up. We sent them to a specific pharmacy, so you will need to go there to get them.   [id number] - [place], [place] - [address]    [address] [place] [address]    Phone: [phone]        atorvastatin 80 MG  tablet   clopidogrel 75 MG tablet   furosemide 40 MG tablet   nitroGLYCERIN 0.4 MG SL tablet   potassium chloride SA 20 MEQ tablet  Outstanding Labs/Studies: BMET in ~ 1 week   Duration of Discharge Encounter: Greater than 30 minutes including physician time.  Signed, R. Hurman Horn, PA-C 01/11/2012, 11:12 AM

## 2012-01-11 NOTE — Progress Notes (Signed)
CARDIAC REHAB PHASE I   PRE:  Rate/Rhythm: 77 SR with PVCs    BP: sitting 120/62    SaO2: 93 2L  MODE:  Ambulation: 300 ft   POST:  Rate/Rhythm: 90 SR (no PVCs)    BP: sitting 126/70     SaO2: 94 2L  Pt used RW and 2L O2, assist x1. Fairly steady. Got feet outside of RW on turn x1. Difficult to get OOB (son tried to help him.) Apparently pt doen't use RW or O2 at home. Enouraged pt to use RW and O2 although could probably use a test again to determine if he needs O2. Was 93-94 2L before and after walk. Ed completed with son present. Pt only slightly receptive. Declined CRPII due to distance of drive.  [phone] Harriet Masson CES, ACSM

## 2012-01-11 NOTE — Discharge Instructions (Signed)
PLEASE REMEMBER TO BRING ALL OF YOUR MEDICATIONS TO EACH OF YOUR FOLLOW-UP OFFICE VISITS.  PLEASE FOR REFILLS OF YOUR HEART MEDICATIONS.   PLEASE TAKE ALL NEW MEDICATIONS/MEDICATION CHANGES AS PRESCRIBED.   PLEASE ATTEND ALL SCHEDULED FOLLOW-UP APPOINTMENTS.   NO HEAVY LIFTING X 2 WEEKS. NO SEXUAL ACTIVITY X 2 WEEKS. NO DRIVING X 1 WEEK. NO SOAKING BATHS, HOT TUBS, POOLS, ETC., X 7 DAYS. YOU MAY RETURN TO WORK IN 1 WEEK (IF APPLICABLE).  You may wash cath site gently with soap and water. Keep cath site clean and dry. If you notice pain, swelling, bleeding or pus at your cath site, please call [phone].  Coronary Angiography with Stent This is a procedure to widen or open a narrow blood vessel of the heart (coronary artery). When a coronary artery becomes partially blocked it decreases blood flow to that area. This may lead to chest pain or a heart attack (myocardial infarction). Arteries may become blocked by cholesterol buildup (plaque) in the lining or wall. A stent is a small piece of metal that looks like a mesh or a spring. Stent placement may be done right after an angiogram that finds a blocked artery or as a treatment for a heart attack. RISKS AND COMPLICATIONS  Damage to the heart.   A blockage may return.   Bleeding at the site.   Blood clot to another part of the body.  PROCEDURE  You may be given a medication to help you relax before and during the procedure through an IV in your hand or arm.   A local anesthetic to make the area numb may be used before inserting the catheter (a long, hollow tube about the size of a piece of cooked spaghetti).   You will be prepared for the procedure by washing and shaving the area where the catheter will be inserted. This is usually done in the groin.   A specially trained doctor will insert the catheter with a guide wire into an artery. This is guided under a special type of X-ray (fluoroscopy) to the opening of the blocked artery.     Special dye is then injected and X-rays are taken.   A tiny wire is guided to the blocked spot and a balloon is inflated to make the artery wider. The stent is expanded and crushes the plaque into the wall of the vessel. The stent holds the area open like a scaffolding and improves the blood flow.   Sometimes the artery may be made wider using a laser or other tools to remove plaque.   When the blood flow is better, the catheter is removed. The lining of the artery will grow over the stent which stays where it was placed.  AFTER THE PROCEDURE  You will stay in bed for several hours.   The access site will be watched and you will be checked frequently.   Blood tests, X-rays and an EKG may be done.   You may stay in the hospital overnight for observation.  SEEK IMMEDIATE MEDICAL CARE IF:   You develop chest pain, shortness of breath, feel faint, or pass out.   There is bleeding, swelling, or drainage from the catheter insertion site.   You develop pain, discoloration, coldness, or severe bruising in the leg or arm that held the catheter.   You see blood in your urine or stool. This may be bright red blood in urine or stools, or also appear as black, tarry stools.   You have a  fever.  Document Released: 01/09/2003 Document Revised: 06/24/2011 Document Reviewed: 09/01/2007 [facility] Patient Information 2012 [place], [place].Groin Site Care Refer to this sheet in the next few weeks. These instructions provide you with information on caring for yourself after your procedure. Your caregiver may also give you more specific instructions. Your treatment has been planned according to current medical practices, but problems sometimes occur. Call your caregiver if you have any problems or questions after your procedure. HOME CARE INSTRUCTIONS  You may shower 24 hours after the procedure. Remove the bandage (dressing) and gently wash the site with plain soap and water. Gently pat the site dry.    Do not apply powder or lotion to the site.   Do not sit in a bathtub, swimming pool, or whirlpool for 5 to 7 days.   No bending, squatting, or lifting anything over 10 pounds (4.5 kg) as directed by your caregiver.   Inspect the site at least twice daily.   Do not drive home if you are discharged the same day of the procedure. Have someone else drive you.   You may drive 24 hours after the procedure unless otherwise instructed by your caregiver.  What to expect:  Any bruising will usually fade within 1 to 2 weeks.   Blood that collects in the tissue (hematoma) may be painful to the touch. It should usually decrease in size and tenderness within 1 to 2 weeks.  SEEK IMMEDIATE MEDICAL CARE IF:  You have unusual pain at the groin site or down the affected leg.   You have redness, warmth, swelling, or pain at the groin site.   You have drainage (other than a small amount of blood on the dressing).   You have chills.   You have a fever or persistent symptoms for more than 72 hours.   You have a fever and your symptoms suddenly get worse.   Your leg becomes pale, cool, tingly, or numb.   You have heavy bleeding from the site. Hold pressure on the site.  Document Released: 08/07/2010 Document Revised: 06/24/2011 Document Reviewed: 08/07/2010 [facility] Patient Information 2012 [place], [place].Radial Site Care Refer to this sheet in the next few weeks. These instructions provide you with information on caring for yourself after your procedure. Your caregiver may also give you more specific instructions. Your treatment has been planned according to current medical practices, but problems sometimes occur. Call your caregiver if you have any problems or questions after your procedure. HOME CARE INSTRUCTIONS  You may shower the day after the procedure.Remove the bandage (dressing) and gently wash the site with plain soap and water.Gently pat the site dry.   Do not apply powder or  lotion to the site.   Do not submerge the affected site in water for 3 to 5 days.   Inspect the site at least twice daily.   Do not flex or bend the affected arm for 24 hours.   No lifting over 5 pounds (2.3 kg) for 5 days after your procedure.   Do not drive home if you are discharged the same day of the procedure. Have someone else drive you.   You may drive 24 hours after the procedure unless otherwise instructed by your caregiver.   Do not operate machinery or power tools for 24 hours.   A responsible adult should be with you for the first 24 hours after you arrive home.  What to expect:  Any bruising will usually fade within 1 to 2 weeks.  Blood that collects in the tissue (hematoma) may be painful to the touch. It should usually decrease in size and tenderness within 1 to 2 weeks.  SEEK IMMEDIATE MEDICAL CARE IF:  You have unusual pain at the radial site.   You have redness, warmth, swelling, or pain at the radial site.   You have drainage (other than a small amount of blood on the dressing).   You have chills.   You have a fever or persistent symptoms for more than 72 hours.   You have a fever and your symptoms suddenly get worse.   Your arm becomes pale, cool, tingly, or numb.   You have heavy bleeding from the site. Hold pressure on the site.  Document Released: 08/07/2010 Document Revised: 06/24/2011 Document Reviewed: 08/07/2010 [employer] Patient Information 2012 [employer], LLCClopidogrel tablets What is this medicine? CLOPIDOGREL (kloh PID oh grel) helps to prevent blood clots. This medicine is used to prevent heart attack, stroke, or other vascular events in people who are at high risk. This medicine may be used for other purposes; ask your health care provider or pharmacist if you have questions. What should I tell my health care provider before I take this medicine? They need to know if you have any of the following conditions: -bleeding  disorder -bleeding in the brain -planned surgery -stomach or intestinal ulcers -stroke or transient ischemic attack -an unusual or allergic reaction to clopidogrel, other medicines, foods, dyes, or preservatives -pregnant or trying to get pregnant -breast-feeding How should I use this medicine? Take this medicine by mouth with a drink of water. Follow the directions on the prescription label. You may take this medicine with or without food. Take your medicine at regular intervals. Do not take your medicine more often than directed. Talk to your pediatrician regarding the use of this medicine in children. Special care may be needed. Overdosage: If you think you have taken too much of this medicine contact a poison control center or emergency room at once. NOTE: This medicine is only for you. Do not share this medicine with others. What if I miss a dose? If you miss a dose, take it as soon as you can. If it is almost time for your next dose, take only that dose. Do not take double or extra doses. What may interact with this medicine? -aspirin -blood thinners like cilostazol, enoxaparin, ticlopidine, and warfarin -certain medicines for depression like citalopram, fluoxetine, and fluvoxamine -certain medicines for fungal infections like ketoconazole, fluconazole, and voriconazole -certain medicines for HIV infection like delavirdine, efavirenz, and etravirine -certain medicines for seizures like felbamate, oxcarbazepine, and phenytoin -chloramphenicol -fluvastatin -isoniazid, INH -medicines for inflammation like ibuprofen and naproxen -modafinil -nicardipine -over-the counter supplements like echinacea, feverfew, fish oil, garlic, ginger, ginkgo, green tea, horse chestnut -quinine -stomach acid blockers like cimetidine, omeprazole, and esomeprazole -tamoxifen -tolbutamide -topiramate -torsemide This list may not describe all possible interactions. Give your health care provider a list  of all the medicines, herbs, non-prescription drugs, or dietary supplements you use. Also tell them if you smoke, drink alcohol, or use illegal drugs. Some items may interact with your medicine. What should I watch for while using this medicine? Visit your doctor or health care professional for regular check ups. Do not stop taking your medicine unless your doctor tells you to. If you are going to have surgery or dental work, tell your doctor or health care professional that you are taking this medicine. Certain genetic factors may reduce the effect of this  medicine. Your doctor may use genetic tests to determine treatment. What side effects may I notice from receiving this medicine? Side effects that you should report to your doctor or health care professional as soon as possible: -allergic reactions like skin rash, itching or hives, swelling of the face, lips, or tongue -black, tarry stools -blood in urine or vomit -breathing problems -changes in vision -fever -sudden weakness -unusual bleeding or bruising Side effects that usually do not require medical attention (report to your doctor or health care professional if they continue or are bothersome): -constipation or diarrhea -headache -pain in back or joints -stomach upset This list may not describe all possible side effects. Call your doctor for medical advice about side effects. You may report side effects to FDA at 1-800-FDA-1088. Where should I keep my medicine? Keep out of the reach of children. Store at room temperature of 59 to 86 degrees F (15 to 30 degrees C). Throw away any unused medicine after the expiration date. NOTE: This sheet is a summary. It may not cover all possible information. If you have questions about this medicine, talk to your doctor, pharmacist, or health care provider.  2012, [employer]. (12/24/2008 9:41:49 AM).

## 2012-01-13 ENCOUNTER — Telehealth: Payer: Self-pay | Admitting: Cardiology

## 2012-01-13 NOTE — Telephone Encounter (Signed)
New Problem:   Refill: Patient's daughter called in needing a refill of her father's Meclizine HCl (BONINE) 25 MG CHEW. Please call back  Debbie: Patient has not had a bowel movement in the past five days and would like to know if there were any over the counter medicines he could take for that.  Please call back

## 2012-01-13 NOTE — Telephone Encounter (Signed)
Daughter aware of medications. Will try colace 200mg  daily and add fruit juices. If no bm will try bisacodyl Pt has follow-up appt with Ward Givens on 01/17/12 Mylo Red RN

## 2012-01-13 NOTE — Telephone Encounter (Signed)
LMOVM  Bonine is OTC. Recommended docusate sodium (colace) stool softner. Mylo Red RN

## 2012-01-17 ENCOUNTER — Ambulatory Visit (INDEPENDENT_AMBULATORY_CARE_PROVIDER_SITE_OTHER): Payer: Medicare Other | Admitting: *Deleted

## 2012-01-17 ENCOUNTER — Encounter: Payer: Self-pay | Admitting: Nurse Practitioner

## 2012-01-17 ENCOUNTER — Ambulatory Visit (INDEPENDENT_AMBULATORY_CARE_PROVIDER_SITE_OTHER): Payer: Medicare Other | Admitting: Nurse Practitioner

## 2012-01-17 VITALS — BP 118/66 | HR 68 | Ht 71.0 in | Wt 294.0 lb

## 2012-01-17 DIAGNOSIS — J00 Acute nasopharyngitis [common cold]: Secondary | ICD-10-CM

## 2012-01-17 DIAGNOSIS — I214 Non-ST elevation (NSTEMI) myocardial infarction: Secondary | ICD-10-CM

## 2012-01-17 DIAGNOSIS — I10 Essential (primary) hypertension: Secondary | ICD-10-CM

## 2012-01-17 DIAGNOSIS — I509 Heart failure, unspecified: Secondary | ICD-10-CM

## 2012-01-17 DIAGNOSIS — E785 Hyperlipidemia, unspecified: Secondary | ICD-10-CM

## 2012-01-17 DIAGNOSIS — I2510 Atherosclerotic heart disease of native coronary artery without angina pectoris: Secondary | ICD-10-CM | POA: Insufficient documentation

## 2012-01-17 DIAGNOSIS — I5032 Chronic diastolic (congestive) heart failure: Secondary | ICD-10-CM

## 2012-01-17 DIAGNOSIS — IMO0001 Reserved for inherently not codable concepts without codable children: Secondary | ICD-10-CM

## 2012-01-17 LAB — BASIC METABOLIC PANEL
BUN: 28 mg/dL — ABNORMAL HIGH (ref 6–23)
Chloride: 97 mEq/L (ref 96–112)
Creatinine, Ser: 1.7 mg/dL — ABNORMAL HIGH (ref 0.4–1.5)
Glucose, Bld: 100 mg/dL — ABNORMAL HIGH (ref 70–99)

## 2012-01-17 NOTE — Patient Instructions (Addendum)
Your physician wants you to follow-up in: 3 MONTHS WITH DR WALL  You will receive a reminder letter in the mail two months in advance. If you don't receive a letter, please call our office to schedule the follow-up appointment.   Your physician recommends that you HAVE LAB WORK TODAY

## 2012-01-17 NOTE — Progress Notes (Signed)
Patient Name: [name] Date of Encounter: 01/17/2012  Primary Care Provider:  Daisy Floro, MD Primary Cardiologist:  T. Wall, MD  Patient Profile  78 year old male with prior history of CAD status post recent non-STEMI and stenting who presents for followup.  Problem List   Past Medical History  Diagnosis Date  . CAD (coronary artery disease)     a. s/p CABG x 3 in 1995;  b.Lexi MV 10/2011: NL EF, No isch/infarct;;  d.  NSTEMI 12/2011 -> Cath 6/21: LM 20-30, LAD 90, RI 95, LCX 60m, RCA 90/13m (treated w/ 2.0x16 Promus DES), VG->RCA ok, VG->RI 70-40m (treated w/ 4.0x28 Promus), LIMA->LAD ok, EF 55-60%.  Marland Kitchen HTN (hypertension)   . HLD (hyperlipidemia)   . Obesity   . Syncope   . COPD (chronic obstructive pulmonary disease)   . Hypoxia   . History of pneumonia     "once"  . HOH (hard of hearing)     left ear  . Asthma   . Sleep apnea   . Arthritis     "all over"  . Dyspnea   . Orthopnea     a. Has been sleeping in a recliner x 30 yrs  . Chronic diastolic CHF (congestive heart failure)     a. Echo 10/2011: EF 55-60%;  b. elevated EDP req diuresis.   Past Surgical History  Procedure Date  . Functional endoscopic sinus surgery 2006  . Hydrocele excision 2005  . Prostate surgery 1998  . Inguinal hernia repair 2006    right  . Coronary artery bypass graft 1995    CABG X3  . Excisional hemorrhoidectomy     "twice cut them out; burnt them out once"    Allergies  No Known Allergies  HPI  78 year old male with the above problem list.  The patient was in his usual state of health until a few weeks ago when he began to experience progressive exertional angina.  About 10 days ago symptoms worsen suddenly and he presented to [facility] where he ruled in for non-STEMI.  He underwent diagnostic catheterization showing severe three-vessel disease as outlined above with significant stenoses in the right coronary artery and vein graft to the ramus intermedius.   Intervention was initially delayed secondary to elevated LVEDP on diagnostic catheter and the patient was diuresed over the weekend.  He was taken back to the catheter lab on June 24 underwent successful stenting of the native RCA and vein graft to the ramus intermedius.  He was discharged the following day and since his discharge has had occasional mild exertional chest discomfort which he says is significantly better than what he had been having prior to his procedure.  Symptoms last a few minutes and resolve with rest.  He has not taken any sublingual nitroglycerin.  He continues to have chronic dyspnea on exertion along with chronic orthopnea and sleeps in a recliner as he has been for 30 years.  Home Medications  Prior to Admission medications   Medication Sig Start Date End Date Taking? Authorizing Provider  ARTIFICIAL TEAR OP Place 2 drops into both eyes daily as needed. For dry eyes   Yes Historical Provider, MD  aspirin 325 MG tablet Take 325 mg by mouth daily.     Yes Historical Provider, MD  atorvastatin (LIPITOR) 80 MG tablet Take 1 tablet (80 mg total) by mouth at bedtime. 01/11/12 01/10/13 Yes Roger A Arguello, PA-C  clopidogrel (PLAVIX) 75 MG tablet Take 1 tablet (75 mg total)  by mouth daily with breakfast. 01/11/12 01/10/13 Yes Roger A Arguello, PA-C  furosemide (LASIX) 40 MG tablet Take 1 tablet (40 mg total) by mouth 2 (two) times daily. 01/11/12 01/10/13 Yes Roger A Arguello, PA-C  hydrALAZINE (APRESOLINE) 25 MG tablet Take 25 mg by mouth 2 (two) times daily. 10/11/11 10/10/12 Yes Storm Frisk, MD  isosorbide mononitrate (IMDUR) 30 MG 24 hr tablet Take 30 mg by mouth daily.   Yes Historical Provider, MD  losartan (COZAAR) 100 MG tablet Take 100 mg by mouth daily.   Yes Historical Provider, MD  Meclizine HCl (BONINE) 25 MG CHEW Chew 1 tablet by mouth 2 (two) times daily.     Yes Historical Provider, MD  Multiple Vitamins-Minerals (MULTIVITAMIN & MINERAL PO) Take 1 tablet by mouth daily.      Yes Historical Provider, MD  Nebivolol HCl (BYSTOLIC) 20 MG TABS Take 20 mg by mouth daily.   Yes Historical Provider, MD  nitroGLYCERIN (NITROSTAT) 0.4 MG SL tablet Place 1 tablet (0.4 mg total) under the tongue every 5 (five) minutes as needed. Call 9-1-1 after taking your third. 01/11/12  Yes Roger A Arguello, PA-C  potassium chloride SA (K-DUR,KLOR-CON) 20 MEQ tablet Take 1 tablet (20 mEq total) by mouth 2 (two) times daily. 01/11/12  Yes Roger A Arguello, PA-C    Review of Systems  Occasional exertional angina which patient says has occurred about 4 times.  He knows this is better than it had been in the past and he is not overly concerned about it.  He has chronic dyspnea on exertion and orthopnea as outlined above.  He does not check his blood pressure or weight himself regularly.  He reports feeling cold all the time though notes that his wife keeps her house at 62.  All other systems reviewed and are otherwise negative except as noted above.  Physical Exam  Blood pressure 118/66, pulse 68, height 5\' 11"  (1.803 m), weight 294 lb (133.358 kg).  General: Pleasant, NAD Psych: Normal affect. Neuro: Alert and oriented X 3. Moves all extremities spontaneously. HEENT: Normal  Neck: Supple without bruits.  Obese and difficult to assess JVP but without obvious elevation. Lungs:  Resp regular and unlabored.  Right basilar crackles noted. Heart: RRR no s3, s4, or murmurs. Abdomen: Soft, non-tender, non-distended, BS + x 4.  Extremities: No clubbing, cyanosis.  Trace bilateral lower extremity edema. DP/PT/Radials 1+ and equal bilaterally.  Right wrist and right femoral catheter site a mildly ecchymotic without bleeding, bruit, or hematoma.  Accessory Clinical Findings  ECG - regular sinus rhythm, 68, poor R-wave progression, left axis deviation, no acute ST or T changes.  Assessment & Plan  1.  Coronary artery disease: Status post PCI and drug-eluting stent placement to the vein graft to  the ramus intermedius along with the native right coronary artery.  He reports having very mild exertional angina on about 4 occasions while walking around his home in the last a few minutes and resolving with rest.  His ECG is nonacute.  He says overall the symptoms are much improved compared to prior to his stent placement.  I offered to titrate his Imdur to 60 mg daily however he says he is feeling great overall and prefers not to titrate medicines any further.  I advised that if he has progressive activity limiting symptoms, he should contact us for either further titration of nitrate, or consideration for relook catheterization.  He is agreeable but reiterates that he is not interested in  additional intervention at this time.  He remains on aspirin, statin, Plavix, and beta blocker therapy.  2.  Chronic diastolic congestive heart failure: Patient had elevated LVEDP in the hospital requiring IV diuresis.  He will have a basic metabolic panel checked today.  He does not weigh himself at home and I encouraged him to begin to.  We also discussed the importance limitation of salt in his diet.  Though he does not add salt to food he does regularly have cold cuts, though he says he looks for a low sodium meat.  His blood pressure and heart rate are well controlled and his weight is listed at 294 today which is down from 298 on discharge.  3.  Hypertension: Stable.  4.  Hyperlipidemia: Continue statin therapy.  5.  Cold intolerance: Patient reports feeling cold ever since his hospitalization.  I will add a TSH to his blood work today.  6.  Disposition: Patient will followup in 3 months with Dr. Daleen Squibb or sooner if his symptoms worsen.  Nicolasa Ducking, NP 01/17/2012, 9:45 AM

## 2012-01-18 ENCOUNTER — Other Ambulatory Visit: Payer: Self-pay | Admitting: *Deleted

## 2012-01-18 DIAGNOSIS — I10 Essential (primary) hypertension: Secondary | ICD-10-CM

## 2012-01-18 LAB — TSH: TSH: 5.05 u[IU]/mL (ref 0.35–5.50)

## 2012-01-25 ENCOUNTER — Other Ambulatory Visit (INDEPENDENT_AMBULATORY_CARE_PROVIDER_SITE_OTHER): Payer: Medicare Other

## 2012-01-25 DIAGNOSIS — I10 Essential (primary) hypertension: Secondary | ICD-10-CM

## 2012-01-25 LAB — BASIC METABOLIC PANEL
BUN: 21 mg/dL (ref 6–23)
CO2: 30 mEq/L (ref 19–32)
Calcium: 8.8 mg/dL (ref 8.4–10.5)
Creatinine, Ser: 1.5 mg/dL (ref 0.4–1.5)

## 2012-04-22 ENCOUNTER — Other Ambulatory Visit: Payer: Self-pay | Admitting: Critical Care Medicine

## 2012-04-24 NOTE — Telephone Encounter (Signed)
Spoke with Dr. Delford Field, he is ok with refilling this medication for pt.

## 2012-04-25 ENCOUNTER — Encounter: Payer: Self-pay | Admitting: Cardiology

## 2012-04-25 ENCOUNTER — Ambulatory Visit (INDEPENDENT_AMBULATORY_CARE_PROVIDER_SITE_OTHER): Payer: Medicare Other | Admitting: Cardiology

## 2012-04-25 VITALS — BP 138/72 | HR 58 | Ht 71.0 in | Wt 293.0 lb

## 2012-04-25 DIAGNOSIS — I2510 Atherosclerotic heart disease of native coronary artery without angina pectoris: Secondary | ICD-10-CM

## 2012-04-25 DIAGNOSIS — I5032 Chronic diastolic (congestive) heart failure: Secondary | ICD-10-CM

## 2012-04-25 NOTE — Patient Instructions (Addendum)
**Note De-identified  Obfuscation** Your physician recommends that you continue on your current medications as directed. Please refer to the Current Medication list given to you today.  Your physician wants you to follow-up in: 1 year. You will receive a reminder letter in the mail two months in advance. If you don't receive a letter, please call our office to schedule the follow-up appointment.  

## 2012-04-25 NOTE — Assessment & Plan Note (Signed)
His angina has resolved. Continue current secondary preventative therapy. Continue dual antiplatelet therapy for at least a year. Return to the office in one year.

## 2012-04-25 NOTE — Progress Notes (Signed)
HPI [name] returns today for followup of his complex cardiovascular disease. He is now about 3 months out from having stents placed after having a non-STEMI. These were placed in the right coronary artery as well as a vein graft to the ramus intermedius. They were drug-eluting stents.  He had initially problems with exertional angina but this is resolved. He has chronic baseline dyspnea on exertion as well as orthopnea and slept in a recliner for years. He seems to be compliant with his meds. He is happy with his results.  Past Medical History  Diagnosis Date  . CAD (coronary artery disease)     a. s/p CABG x 3 in 1995;  b.Lexi MV 10/2011: NL EF, No isch/infarct;;  d.  NSTEMI 12/2011 -> Cath 6/21: LM 20-30, LAD 90, RI 95, LCX 28m, RCA 90/162m (treated w/ 2.0x16 Promus DES), VG->RCA ok, VG->RI 70-58m (treated w/ 4.0x28 Promus), LIMA->LAD ok, EF 55-60%.  Marland Kitchen HTN (hypertension)   . HLD (hyperlipidemia)   . Obesity   . Syncope   . COPD (chronic obstructive pulmonary disease)   . Hypoxia   . History of pneumonia     "once"  . HOH (hard of hearing)     left ear  . Asthma   . Sleep apnea   . Arthritis     "all over"  . Dyspnea   . Orthopnea     a. Has been sleeping in a recliner x 30 yrs  . Chronic diastolic CHF (congestive heart failure)     a. Echo 10/2011: EF 55-60%;  b. elevated EDP req diuresis.    Current Outpatient Prescriptions  Medication Sig Dispense Refill  . ARTIFICIAL TEAR OP Place 2 drops into both eyes daily as needed. For dry eyes      . aspirin 325 MG tablet Take 325 mg by mouth daily.        Marland Kitchen atorvastatin (LIPITOR) 80 MG tablet Take 1 tablet (80 mg total) by mouth at bedtime.  30 tablet  3  . BYSTOLIC 20 MG TABS TAKE ONE TABLET BY MOUTH DAILY.  30 each  3  . clopidogrel (PLAVIX) 75 MG tablet Take 1 tablet (75 mg total) by mouth daily with breakfast.  30 tablet  3  . furosemide (LASIX) 40 MG tablet Take 1 tablet (40 mg total) by mouth 2 (two) times daily.  60 tablet  3    . hydrALAZINE (APRESOLINE) 25 MG tablet Take 25 mg by mouth 2 (two) times daily.      . isosorbide mononitrate (IMDUR) 30 MG 24 hr tablet Take 30 mg by mouth daily.      Marland Kitchen losartan (COZAAR) 100 MG tablet Take 100 mg by mouth daily.      . Meclizine HCl (BONINE) 25 MG CHEW Chew 1 tablet by mouth 2 (two) times daily.        . Multiple Vitamins-Minerals (MULTIVITAMIN & MINERAL PO) Take 1 tablet by mouth daily.        . Nebivolol HCl (BYSTOLIC) 20 MG TABS Take 20 mg by mouth daily.      . nitroGLYCERIN (NITROSTAT) 0.4 MG SL tablet Place 1 tablet (0.4 mg total) under the tongue every 5 (five) minutes as needed. Call 9-1-1 after taking your third.  25 tablet  3  . potassium chloride SA (K-DUR,KLOR-CON) 20 MEQ tablet Take 1 tablet (20 mEq total) by mouth 2 (two) times daily.  60 tablet  3    Not on File  No family history  on file.  History   Social History  . Marital Status: Married    Spouse Name: N/A    Number of Children: N/A  . Years of Education: N/A   Occupational History  . Retired     English as a second language teacher   Social History Main Topics  . Smoking status: Never Smoker   . Smokeless tobacco: Never Used  . Alcohol Use: Yes     01/06/12 "used to drink a little; last drink ~ 15-20 yr ago"  . Drug Use: No  . Sexually Active: Yes   Other Topics Concern  . Not on file   Social History Narrative  . No narrative on file    ROS ALL NEGATIVE EXCEPT THOSE NOTED IN HPI  PE  General Appearance: well developed, well nourished in no acute , markedly overweight HEENT: symmetrical face, PERRLA, Neck: no JVD, thyromegaly, or adenopathy, trachea midline Chest: symmetric without deformity Cardiac: PMI non-displaced, RRR, soft S1, S2, no gallop or murmur Lung: clear to ausculation and percussion Vascular: all pulses full without bruits  Abdominal: Distended, nontender, good bowel sounds, no HSM, no bruits Extremities: no cyanosis, clubbing , minimal pitting pretibial edema no  sign of DVT, no varicosities  Skin: normal color, no rashes Neuro: alert and oriented x 3, non-focal Pysch: normal affect  EKG Not repeated  BMET    Component Value Date/Time   NA 140 01/25/2012 1049   K 4.1 01/25/2012 1049   CL 102 01/25/2012 1049   CO2 30 01/25/2012 1049   GLUCOSE 107* 01/25/2012 1049   BUN 21 01/25/2012 1049   CREATININE 1.5 01/25/2012 1049   CREATININE 1.17 10/11/2011 1032   CALCIUM 8.8 01/25/2012 1049   GFRNONAA 50* 01/11/2012 0345   GFRAA 58* 01/11/2012 0345    Lipid Panel  No results found for this basename: chol, trig, hdl, cholhdl, vldl, ldlcalc    CBC    Component Value Date/Time   WBC 9.6 01/11/2012 0345   RBC 4.43 01/11/2012 0345   HGB 13.6 01/11/2012 0345   HCT 42.7 01/11/2012 0345   PLT 179 01/11/2012 0345   MCV 96.4 01/11/2012 0345   MCH 30.7 01/11/2012 0345   MCHC 31.9 01/11/2012 0345   RDW 13.5 01/11/2012 0345   LYMPHSABS 1.9 01/06/2012 1336   MONOABS 0.7 01/06/2012 1336   EOSABS 0.1 01/06/2012 1336   BASOSABS 0.0 01/06/2012 1336

## 2012-04-25 NOTE — Assessment & Plan Note (Signed)
Stable clinically. Continue medical therapy and encourage weight loss.

## 2012-05-05 ENCOUNTER — Other Ambulatory Visit (HOSPITAL_COMMUNITY): Payer: Self-pay | Admitting: Physician Assistant

## 2012-05-12 ENCOUNTER — Other Ambulatory Visit (HOSPITAL_COMMUNITY): Payer: Self-pay | Admitting: Physician Assistant

## 2012-05-19 ENCOUNTER — Other Ambulatory Visit: Payer: Self-pay | Admitting: Nurse Practitioner

## 2012-05-27 ENCOUNTER — Other Ambulatory Visit (HOSPITAL_COMMUNITY): Payer: Self-pay | Admitting: Physician Assistant

## 2012-06-13 ENCOUNTER — Ambulatory Visit: Payer: Medicare Other | Admitting: Internal Medicine

## 2012-07-03 ENCOUNTER — Encounter: Payer: Self-pay | Admitting: Critical Care Medicine

## 2012-07-03 ENCOUNTER — Ambulatory Visit (INDEPENDENT_AMBULATORY_CARE_PROVIDER_SITE_OTHER): Payer: Medicare Other | Admitting: Critical Care Medicine

## 2012-07-03 VITALS — BP 114/74 | HR 66 | Temp 97.5°F | Wt 294.0 lb

## 2012-07-03 DIAGNOSIS — J449 Chronic obstructive pulmonary disease, unspecified: Secondary | ICD-10-CM

## 2012-07-03 NOTE — Assessment & Plan Note (Signed)
Only mild peripheral airflow obstruction without improvement with bronchodilators Pt is much better with dyspnea after CAD rx with DES stents and rx of diastolic heart failure and change to more selective Beta blocker Plan D/c of all further inhaled meds Oxygen QHS only Pulm f/u is prn

## 2012-07-03 NOTE — Patient Instructions (Signed)
No change in medications. Return as needed Use oxygen 2Liters at night and as needed daytime

## 2012-07-03 NOTE — Progress Notes (Signed)
Subjective:    Patient ID: [name], male    DOB: 1931-05-07, 78 y.o.   MRN: [id number]  HPI Comments:   07/03/2012 Since last OV pt had to have stents to Coronaries.  12/2011.  VG and RCA.  (2 DES). Pt notes dyspnea is better with this. Pt is still dyspneic with exertion.  Notes some cough. No real wheeze.  Notes some edema in feet. Mucus is now yellow.  No real qhs dyspnea  Past Medical History  Diagnosis Date  . CAD (coronary artery disease)     a. s/p CABG x 3 in 1995;  b.Lexi MV 10/2011: NL EF, No isch/infarct;;  d.  NSTEMI 12/2011 -> Cath 6/21: LM 20-30, LAD 90, RI 95, LCX 13m, RCA 90/130m (treated w/ 2.0x16 Promus DES), VG->RCA ok, VG->RI 70-51m (treated w/ 4.0x28 Promus), LIMA->LAD ok, EF 55-60%.  Marland Kitchen HTN (hypertension)   . HLD (hyperlipidemia)   . Obesity   . Syncope   . COPD (chronic obstructive pulmonary disease)   . Hypoxia   . History of pneumonia     "once"  . HOH (hard of hearing)     left ear  . Asthma   . Sleep apnea   . Arthritis     "all over"  . Dyspnea   . Orthopnea     a. Has been sleeping in a recliner x 30 yrs  . Chronic diastolic CHF (congestive heart failure)     a. Echo 10/2011: EF 55-60%;  b. elevated EDP req diuresis.     No family history on file.   History   Social History  . Marital Status: Married    Spouse Name: N/A    Number of Children: N/A  . Years of Education: N/A   Occupational History  . Retired     English as a second language teacher   Social History Main Topics  . Smoking status: Never Smoker   . Smokeless tobacco: Never Used  . Alcohol Use: Yes     Comment: 01/06/12 "used to drink a little; last drink ~ 15-20 yr ago"  . Drug Use: No  . Sexually Active: Yes   Other Topics Concern  . Not on file   Social History Narrative  . No narrative on file     No Known Allergies   Outpatient Prescriptions Prior to Visit  Medication Sig Dispense Refill  . ARTIFICIAL TEAR OP Place 2 drops into both eyes daily as needed. For  dry eyes      . aspirin 325 MG tablet Take 325 mg by mouth daily.        Marland Kitchen atorvastatin (LIPITOR) 80 MG tablet TAKE ONE TABLET BY MOUTH AT BEDTIME  30 tablet  2  . BYSTOLIC 20 MG TABS TAKE ONE TABLET BY MOUTH DAILY.  30 each  3  . clopidogrel (PLAVIX) 75 MG tablet TAKE ONE TABLET BY MOUTH EVERY DAY WITH  BREAKFAST  30 tablet  2  . furosemide (LASIX) 40 MG tablet Take 1 tablet (40 mg total) by mouth 2 (two) times daily.  60 tablet  3  . hydrALAZINE (APRESOLINE) 25 MG tablet Take 25 mg by mouth 2 (two) times daily.      . isosorbide mononitrate (IMDUR) 30 MG 24 hr tablet Take 30 mg by mouth daily.      Marland Kitchen KLOR-CON M20 20 MEQ tablet TAKE ONE TABLET BY MOUTH TWICE DAILY  60 tablet  2  . losartan (COZAAR) 100 MG tablet TAKE ONE TABLET BY MOUTH  DAILY  30 tablet  5  . Meclizine HCl (BONINE) 25 MG CHEW Chew 1 tablet by mouth 2 (two) times daily.        . Multiple Vitamins-Minerals (MULTIVITAMIN & MINERAL PO) Take 1 tablet by mouth daily.        . nitroGLYCERIN (NITROSTAT) 0.4 MG SL tablet Place 1 tablet (0.4 mg total) under the tongue every 5 (five) minutes as needed. Call 9-1-1 after taking your third.  25 tablet  3  . [DISCONTINUED] losartan (COZAAR) 100 MG tablet Take 100 mg by mouth daily.      . [DISCONTINUED] Nebivolol HCl (BYSTOLIC) 20 MG TABS Take 20 mg by mouth daily.      Last reviewed on 07/03/2012 10:07 AM by Storm Frisk, MD     Review of Systems Constitutional:   No  weight loss, night sweats,  Fevers, chills, fatigue, lassitude. HEENT:   No headaches,  Difficulty swallowing,  Tooth/dental problems,  Sore throat,                No sneezing, itching, ear ache, nasal congestion, post nasal drip,   CV:  Notes chest pain,  +Orthopnea,   PND,  No anasarca, dizziness, palpitations  +LE edema  GI  No heartburn, indigestion, abdominal pain, nausea, vomiting, diarrhea, change in bowel habits, loss of appetite  Resp: Notes shortness of breath with exertion not at rest.  No excess mucus,  no productive cough,  No non-productive cough,  No coughing up of blood.  No change in color of mucus.  No wheezing.  No chest wall deformity Neg snoring or hypersolomence  Skin: no rash or lesions.  GU: no dysuria, change in color of urine, no urgency or frequency.  No flank pain.  MS:  No joint pain or swelling.  No decreased range of motion.  No back pain.  Psych:  No change in mood or affect. No depression or anxiety.  No memory loss.  Objective:   Physical Exam BP 114/74  Pulse 66  Temp 97.5 F (36.4 C)  Wt 294 lb (133.358 kg)  SpO2 96%  Gen:  obese , in no distress, anxious  ENT: No lesions,  mouth clear,  oropharynx clear, no postnasal drip, class 2 airway   Neck: no JVD, no TMG, no carotid bruits  Lungs: No use of accessory muscles, no dullness to percussion, clear at  bases   Cardiovascular: RRR, heart sounds normal, no murmur or gallops, no  peripheral edema  Abdomen: soft and NT, no HSM,  BS normal, obese   Musculoskeletal: No deformities, no cyanosis or clubbing  Neuro: alert, non focal  Skin: Warm, no lesions or rashes          Assessment & Plan:    Obstructive chronic bronchitis without exacerbation Only mild peripheral airflow obstruction without improvement with bronchodilators Pt is much better with dyspnea after CAD rx with DES stents and rx of diastolic heart failure and change to more selective Beta blocker Plan D/c of all further inhaled meds Oxygen QHS only Pulm f/u is prn      Updated Medication List Outpatient Encounter Prescriptions as of 07/03/2012  Medication Sig Dispense Refill  . ARTIFICIAL TEAR OP Place 2 drops into both eyes daily as needed. For dry eyes      . aspirin 325 MG tablet Take 325 mg by mouth daily.        Marland Kitchen atorvastatin (LIPITOR) 80 MG tablet TAKE ONE TABLET BY MOUTH AT BEDTIME  30 tablet  2  . BYSTOLIC 20 MG TABS TAKE ONE TABLET BY MOUTH DAILY.  30 each  3  . clopidogrel (PLAVIX) 75 MG tablet TAKE ONE TABLET BY  MOUTH EVERY DAY WITH  BREAKFAST  30 tablet  2  . furosemide (LASIX) 40 MG tablet Take 1 tablet (40 mg total) by mouth 2 (two) times daily.  60 tablet  3  . hydrALAZINE (APRESOLINE) 25 MG tablet Take 25 mg by mouth 2 (two) times daily.      . isosorbide mononitrate (IMDUR) 30 MG 24 hr tablet Take 30 mg by mouth daily.      Marland Kitchen KLOR-CON M20 20 MEQ tablet TAKE ONE TABLET BY MOUTH TWICE DAILY  60 tablet  2  . losartan (COZAAR) 100 MG tablet TAKE ONE TABLET BY MOUTH DAILY  30 tablet  5  . Meclizine HCl (BONINE) 25 MG CHEW Chew 1 tablet by mouth 2 (two) times daily.        . Multiple Vitamins-Minerals (MULTIVITAMIN & MINERAL PO) Take 1 tablet by mouth daily.        . nitroGLYCERIN (NITROSTAT) 0.4 MG SL tablet Place 1 tablet (0.4 mg total) under the tongue every 5 (five) minutes as needed. Call 9-1-1 after taking your third.  25 tablet  3  . [DISCONTINUED] losartan (COZAAR) 100 MG tablet Take 100 mg by mouth daily.      . [DISCONTINUED] Nebivolol HCl (BYSTOLIC) 20 MG TABS Take 20 mg by mouth daily.

## 2012-07-31 ENCOUNTER — Other Ambulatory Visit: Payer: Self-pay | Admitting: Critical Care Medicine

## 2012-08-03 NOTE — Telephone Encounter (Signed)
Per PW, ok to refill medication as requested for tid.

## 2012-08-08 ENCOUNTER — Other Ambulatory Visit: Payer: Self-pay | Admitting: Critical Care Medicine

## 2012-08-23 ENCOUNTER — Other Ambulatory Visit: Payer: Self-pay | Admitting: *Deleted

## 2012-08-23 MED ORDER — POTASSIUM CHLORIDE CRYS ER 20 MEQ PO TBCR: 20.0000 meq | EXTENDED_RELEASE_TABLET | Freq: Two times a day (BID) | ORAL | Status: DC

## 2012-09-21 ENCOUNTER — Other Ambulatory Visit: Payer: Self-pay | Admitting: Critical Care Medicine

## 2012-10-05 ENCOUNTER — Other Ambulatory Visit: Payer: Self-pay | Admitting: Critical Care Medicine

## 2012-10-05 MED ORDER — FUROSEMIDE 40 MG PO TABS: 40.0000 mg | ORAL_TABLET | Freq: Two times a day (BID) | ORAL | Status: DC

## 2012-10-31 ENCOUNTER — Other Ambulatory Visit: Payer: Self-pay | Admitting: Nurse Practitioner

## 2012-11-07 ENCOUNTER — Other Ambulatory Visit: Payer: Self-pay

## 2012-11-07 ENCOUNTER — Other Ambulatory Visit: Payer: Self-pay | Admitting: *Deleted

## 2012-11-07 MED ORDER — LOSARTAN POTASSIUM 100 MG PO TABS: 100.0000 mg | ORAL_TABLET | Freq: Every day | ORAL | Status: DC

## 2012-11-07 MED ORDER — ATORVASTATIN CALCIUM 80 MG PO TABS: 80.0000 mg | ORAL_TABLET | Freq: Every day | ORAL | Status: DC

## 2012-11-07 NOTE — Telephone Encounter (Signed)
Patient Instructions    Your physician recommends that you continue on your current medications as directed. Please refer to the Current Medication list given to you today.  Your physician wants you to follow-up in: 1 year. You will receive a reminder letter in the mail two months in advance. If you don't receive a letter, please call our office to schedule the follow-up appointment.          Patient Instructions History Recorded                     Previous Visit      Provider Department Encounter #   04/22/2012 10:21 AM Valera Castle, MD [facility] [id number]

## 2012-11-14 ENCOUNTER — Other Ambulatory Visit: Payer: Self-pay | Admitting: Nurse Practitioner

## 2012-11-14 ENCOUNTER — Other Ambulatory Visit: Payer: Self-pay | Admitting: Critical Care Medicine

## 2012-11-15 NOTE — Telephone Encounter (Signed)
Hydralazine 25 mg tid rx last sent on Jul 31, 2012 # 90 x 2. Last OV with Dr. Delford Field 07/03/12. Dr. Delford Field, pls advise if you are ok to fill this medication or not.  Thank you.

## 2012-12-17 ENCOUNTER — Other Ambulatory Visit: Payer: Self-pay | Admitting: Critical Care Medicine

## 2012-12-19 ENCOUNTER — Ambulatory Visit (INDEPENDENT_AMBULATORY_CARE_PROVIDER_SITE_OTHER): Payer: Medicare Other | Admitting: Cardiology

## 2012-12-19 ENCOUNTER — Encounter: Payer: Self-pay | Admitting: Cardiology

## 2012-12-19 ENCOUNTER — Ambulatory Visit (HOSPITAL_COMMUNITY)
Admission: RE | Admit: 2012-12-19 | Discharge: 2012-12-19 | Disposition: A | Discharge status: Home / Self Care | Payer: Medicare Other | Source: Ambulatory Visit | Attending: Cardiology | Admitting: Cardiology

## 2012-12-19 VITALS — BP 133/80 | HR 61 | Ht 71.0 in | Wt 299.0 lb

## 2012-12-19 DIAGNOSIS — J4489 Other specified chronic obstructive pulmonary disease: Secondary | ICD-10-CM

## 2012-12-19 DIAGNOSIS — I2510 Atherosclerotic heart disease of native coronary artery without angina pectoris: Secondary | ICD-10-CM

## 2012-12-19 DIAGNOSIS — I10 Essential (primary) hypertension: Secondary | ICD-10-CM

## 2012-12-19 DIAGNOSIS — R0602 Shortness of breath: Secondary | ICD-10-CM

## 2012-12-19 DIAGNOSIS — R079 Chest pain, unspecified: Secondary | ICD-10-CM | POA: Insufficient documentation

## 2012-12-19 DIAGNOSIS — J449 Chronic obstructive pulmonary disease, unspecified: Secondary | ICD-10-CM

## 2012-12-19 MED ORDER — NEBIVOLOL HCL 10 MG PO TABS: 10.0000 mg | ORAL_TABLET | Freq: Every day | ORAL | Status: DC

## 2012-12-19 NOTE — Progress Notes (Signed)
HPI [name] returns today for his history of coronary artery disease, history of angina and dyspnea on exertion as an anginal equivalent, hypertension, hyperlipidemia, obesity, COPD with chronic dyspnea and shortness of breath. He is status post stents last year with improvement in his dyspnea on exertion. He had negative stress Myoview after this and has normal left ventricular systolic function.  He's been more short of breath in the last 3 months. He is short of breath just at rest. He has been known to sleep in a recliner for years. Nebulization does help. He has thick clear mucus. He denies any fever chills or hemoptysis. He sees Dr. Delford Field in pulmonary.  He denies any increase in weight and any increase in his edema. In fact is lost about 20 pounds voluntarily.  Past Medical History  Diagnosis Date  . CAD (coronary artery disease)     a. s/p CABG x 3 in 1995;  b.Lexi MV 10/2011: NL EF, No isch/infarct;;  d.  NSTEMI 12/2011 -> Cath 6/21: LM 20-30, LAD 90, RI 95, LCX 38m, RCA 90/139m (treated w/ 2.0x16 Promus DES), VG->RCA ok, VG->RI 70-44m (treated w/ 4.0x28 Promus), LIMA->LAD ok, EF 55-60%.  Marland Kitchen HTN (hypertension)   . HLD (hyperlipidemia)   . Obesity   . Syncope   . COPD (chronic obstructive pulmonary disease)   . Hypoxia   . History of pneumonia     "once"  . HOH (hard of hearing)     left ear  . Asthma   . Sleep apnea   . Arthritis     "all over"  . Dyspnea   . Orthopnea     a. Has been sleeping in a recliner x 30 yrs  . Chronic diastolic CHF (congestive heart failure)     a. Echo 10/2011: EF 55-60%;  b. elevated EDP req diuresis.    Current Outpatient Prescriptions  Medication Sig Dispense Refill  . albuterol (PROVENTIL) (2.5 MG/3ML) 0.083% nebulizer solution Take 2.5 mg by nebulization every 6 (six) hours as needed for wheezing.      Marland Kitchen ARTIFICIAL TEAR OP Place 2 drops into both eyes daily as needed. For dry eyes      . aspirin 325 MG tablet Take 325 mg by mouth daily.         Marland Kitchen atorvastatin (LIPITOR) 80 MG tablet Take 1 tablet (80 mg total) by mouth at bedtime.  90 tablet  1  . BYSTOLIC 20 MG TABS TAKE ONE TABLET BY MOUTH DAILY  30 each  3  . clopidogrel (PLAVIX) 75 MG tablet TAKE ONE TABLET BY MOUTH EVERY DAY WITH  BREAKFAST  30 tablet  2  . furosemide (LASIX) 40 MG tablet Take 1 tablet (40 mg total) by mouth 2 (two) times daily.  60 tablet  3  . hydrALAZINE (APRESOLINE) 25 MG tablet       . isosorbide mononitrate (IMDUR) 30 MG 24 hr tablet TAKE ONE TABLET BY MOUTH DAILY  30 tablet  4  . losartan (COZAAR) 100 MG tablet TAKE ONE TABLET BY MOUTH DAILY  30 tablet  3  . Meclizine HCl (BONINE) 25 MG CHEW Chew 1 tablet by mouth 2 (two) times daily.        . Multiple Vitamins-Minerals (MULTIVITAMIN & MINERAL PO) Take 1 tablet by mouth daily.        . nitroGLYCERIN (NITROSTAT) 0.4 MG SL tablet Place 1 tablet (0.4 mg total) under the tongue every 5 (five) minutes as needed. Call 9-1-1 after taking your  third.  25 tablet  3  . potassium chloride SA (KLOR-CON M20) 20 MEQ tablet Take 1 tablet (20 mEq total) by mouth 2 (two) times daily.  60 tablet  5   No current facility-administered medications for this visit.    No Known Allergies  History reviewed. No pertinent family history.  History   Social History  . Marital Status: Married    Spouse Name: N/A    Number of Children: N/A  . Years of Education: N/A   Occupational History  . Retired     English as a second language teacher   Social History Main Topics  . Smoking status: Never Smoker   . Smokeless tobacco: Never Used  . Alcohol Use: Yes     Comment: 01/06/12 "used to drink a little; last drink ~ 15-20 yr ago"  . Drug Use: No  . Sexually Active: Yes   Other Topics Concern  . Not on file   Social History Narrative  . No narrative on file    ROS ALL NEGATIVE EXCEPT THOSE NOTED IN HPI  PE  General Appearance: well developed, well nourished in no acute distress, obese, mildly dyspneic at rest HEENT:  symmetrical face, PERRLA,   Neck: no JVD, thyromegaly, or adenopathy, trachea midline Chest: symmetric without deformity Cardiac: PMI non-displaced, RRR, normal S1, S2, no gallop or murmur Lung: Mild inspiratory expiratory rhonchi with crackles and reduced breath sounds in both bases right greater than left Vascular: all pulses full without bruits  Abdominal: nondistended, nontender, good bowel sounds, no HSM, no bruits Extremities: no cyanosis, clubbing , minimal edema, no sign of DVT, no varicosities  Skin: normal color, no rashes Neuro: alert and oriented x 3, non-focal Pysch: normal affect  EKG All sinus rhythm with first-degree block, no acute changes, low-voltage BMET    Component Value Date/Time   NA 140 01/25/2012 1049   K 4.1 01/25/2012 1049   CL 102 01/25/2012 1049   CO2 30 01/25/2012 1049   GLUCOSE 107* 01/25/2012 1049   BUN 21 01/25/2012 1049   CREATININE 1.5 01/25/2012 1049   CREATININE 1.17 10/11/2011 1032   CALCIUM 8.8 01/25/2012 1049   GFRNONAA 50* 01/11/2012 0345   GFRAA 58* 01/11/2012 0345    Lipid Panel  No results found for this basename: chol, trig, hdl, cholhdl, vldl, ldlcalc    CBC    Component Value Date/Time   WBC 9.6 01/11/2012 0345   RBC 4.43 01/11/2012 0345   HGB 13.6 01/11/2012 0345   HCT 42.7 01/11/2012 0345   PLT 179 01/11/2012 0345   MCV 96.4 01/11/2012 0345   MCH 30.7 01/11/2012 0345   MCHC 31.9 01/11/2012 0345   RDW 13.5 01/11/2012 0345   LYMPHSABS 1.9 01/06/2012 1336   MONOABS 0.7 01/06/2012 1336   EOSABS 0.1 01/06/2012 1336   BASOSABS 0.0 01/06/2012 1336

## 2012-12-19 NOTE — Patient Instructions (Addendum)
A chest x-ray takes a picture of the organs and structures inside the chest, including the heart, lungs, and blood vessels. This test can show several things, including, whether the heart is enlarges; whether fluid is building up in the lungs; and whether pacemaker / defibrillator leads are still in place.  Your physician has recommended you make the following change in your medication: DECREASE Bystolic to 10mg  take one by mouth daily  Your physician wants you to follow-up in: 6 MONTHS with Dr Delton See in [place].  You will receive a reminder letter in the mail two months in advance. If you don't receive a letter, please call our office to schedule the follow-up appointment.

## 2012-12-19 NOTE — Assessment & Plan Note (Signed)
Stable. He is not having angina. He has dyspnea but is at rest. EKG is normal. I think this is pulmonary.

## 2012-12-19 NOTE — Assessment & Plan Note (Signed)
It appears she is having a COPD exacerbation. Will continue his nebulizer. We'll obtain chest x-ray rule out pneumonia. I do not think this is heart failure. We'll cut his bisoprolol down to 10 mg a day.

## 2012-12-20 ENCOUNTER — Encounter: Payer: Self-pay | Admitting: Cardiology

## 2012-12-27 ENCOUNTER — Other Ambulatory Visit: Payer: Self-pay

## 2013-01-03 NOTE — Telephone Encounter (Signed)
Candace I will forward this to her pulmonologist debbie

## 2013-01-05 ENCOUNTER — Encounter: Payer: Self-pay | Admitting: Internal Medicine

## 2013-01-05 ENCOUNTER — Ambulatory Visit (INDEPENDENT_AMBULATORY_CARE_PROVIDER_SITE_OTHER): Payer: Medicare Other | Admitting: Internal Medicine

## 2013-01-05 ENCOUNTER — Telehealth: Payer: Self-pay | Admitting: *Deleted

## 2013-01-05 VITALS — BP 112/64 | HR 90 | Ht 69.0 in | Wt 307.8 lb

## 2013-01-05 DIAGNOSIS — R079 Chest pain, unspecified: Secondary | ICD-10-CM

## 2013-01-05 DIAGNOSIS — J449 Chronic obstructive pulmonary disease, unspecified: Secondary | ICD-10-CM

## 2013-01-05 NOTE — Telephone Encounter (Addendum)
Message copied by Valentino Hue on Fri Jan 05, 2013  9:28 AM ------      Message from: Lisabeth Devoid F      Created: Wed Jan 03, 2013  2:39 PM      Regarding: refilll pulmonary medication       Heloise Gordan            I believe Dr. Daleen Squibb may have refilled this once ( albuterol nebulizer)  for pt. I sent you the refill as he is also a patient of Dr. Delford Field. This will ensure continuity for pt.   Dr. Daleen Squibb will not be in the office anymore after 03/16/13.            Thank you      Mylo Red RN       ------  Per last OV with PW on 07/03/2012:  Obstructive chronic bronchitis without exacerbation - Storm Frisk, MD at 07/03/2012 6:12 PM   Status: Written Related Problem: Obstructive chronic bronchitis without exacerbation        Only mild peripheral airflow obstruction without improvement with bronchodilators  Pt is much better with dyspnea after CAD rx with DES stents and rx of diastolic heart failure and change to more selective Beta blocker  Plan  D/c of all further inhaled meds  Oxygen QHS only  Pulm f/u is prn   ------  During this OV, albuterol neb wasn't on pt's med list.  Called, spoke with pt who reports since seeing PW last, he has been seen by PCP for an episode of chest pain, increased SOB, wheezing, and cough.  Per pt, Dr. Tenny Craw advised him to take the albuterol neb 3-4 times daily.  Pt reports he is still having SOB with any activity and states "Dr. Tenny Craw said my asthma is what started this mess."  He would like to come in today to see provider to get this sorted out.  We have scheduled him to see CDY today at 2:45 pm in [place].  Pt aware of appt location,date, and time.  Advised to bring all meds he is currently taking to OV.  He verbalized understanding.

## 2013-01-05 NOTE — Assessment & Plan Note (Signed)
Present intermittently since approx Jan, 2014 and related to exertion but not affected by NTG. Note cardiology w/u focused more at dyspnea but with neg Myoview. I don't hear pericardial rub. This may be chest wall pain. It may be worse if work of breathing is increased. Doubt GI pain or broken sternal wire.  Plan- He is asked to report any change in character of this pain.

## 2013-01-05 NOTE — Patient Instructions (Addendum)
On days when you are going to be around the house, try taking 3 lasix/ furosemide per day. Since its inconvenient to have to stay so close to bathroom, ok to just take 2 tabs on days you need to be away from home.  Let Dr Daleen Squibb and Dr Tenny Craw know if the chest pains get worse.

## 2013-01-05 NOTE — Assessment & Plan Note (Signed)
Weight up 14 lbs since Oct, 2013. Dyspnea likely due to fluid retention, obesity, deconditioning, obesity/ hypoventilation Plan- on days when at home, take lasix 3 daily. Otherwise ok to take only 2 daily, since the polyuria is a major problem for him apparently.

## 2013-01-05 NOTE — Progress Notes (Signed)
01/05/13- 82 yoM never smoker followed by Dr Delford Field (LOV 07/03/12) for chronic obstructive bronchitis, complicated by CAD/ Stents/ CABG/ Diastolic CHF/HBP ACUTE VISIT: PW patient; having center chest pain for about 6 months; seen by Dr Valla Leaver Ross-put on nebulizer medciations(albuterol and singulair)-felt fine for a while. Has gained about 20# in past 2-3 months. Dr Daleen Squibb saw 12/19/12- refers to negative Myoview, attributed dyspnea to lungs, not CHF, EKG wnl, but didn't mention chest pain. Pt describes intermittent mid sternal pain x 6 months- exertional/ relieved by rest, non-radiating, not related to use of arms vs walking, not tender to touch, not noted during sleep or swallowing, not relieved by NTG, not associated with reflux or cough, palpitation or syncope. Nebulizer helps dyspnea.  Lasix 3 daily> inconvenient polyuria. Reduced to 2 daily 1 month ago. He is aware of fluid retention and calves tight. O2 2L/ sleep/ Advanced. CXR 12/22/12-  IMPRESSION:  No acute cardiopulmonary disease.  Original Report Authenticated By: Amie Portland, M.D.  ROS-see HPI Constitutional:   No-   weight loss, night sweats, fevers, chills, fatigue, lassitude. HEENT:   No-  headaches, difficulty swallowing, tooth/dental problems, sore throat,       No-  sneezing, itching, ear ache, nasal congestion, post nasal drip,  CV:  + chest pain, No- orthopnea, PND, +swelling in lower extremities, No-anasarca,                                  dizziness, palpitations Resp: +shortness of breath with exertion or at rest.              No-   productive cough,  No non-productive cough,  No- coughing up of blood.              No-   change in color of mucus.  No- wheezing.  eF .Skin: No-   rash or lesions. GI:  No-   heartburn, indigestion, abdominal pain, nausea, vomiting,  GU:  MS:  No-   joint pain or swelling.   Neuro-     nothing unusual Psych:  No- change in mood or affect. No depression or anxiety.  No memory loss.  OBJ-  Physical Exam General- Alert, Oriented, Affect-appropriate, Distress- none acute, + marked obesity, NAD Skin- rash-none, lesions- none, excoriation- none Lymphadenopathy- none Head- atraumatic            Eyes- Gross vision intact, PERRLA, conjunctivae and secretions clear            Ears- +HOH            Nose- Clear, no-Septal dev, mucus, polyps, erosion, perforation             Throat- Mallampati II , mucosa clear , drainage- none, tonsils- atrophic Neck- flexible , trachea midline, no stridor , thyroid nl, carotid no bruit Chest - symmetrical excursion , unlabored           Heart/CV- RRR , no murmur , no gallop  , no rub, nl s1 s2                           - JVD+ 1 cm , edema-+1, stasis changes+mild, varices- none           Lung- +decreased in lower zones w/ faint rales. , wheeze- none, cough- none , dullness-none, rub- none  Chest wall- Sternum not tender. No eroding wire sutures Abd-  Br/ Gen/ Rectal- Not done, not indicated Extrem- cyanosis- none, clubbing, none, atrophy- none, strength- nl Neuro- grossly intact to observation

## 2013-02-20 ENCOUNTER — Other Ambulatory Visit: Payer: Self-pay | Admitting: Cardiology

## 2013-03-02 ENCOUNTER — Other Ambulatory Visit: Payer: Self-pay | Admitting: *Deleted

## 2013-03-02 MED ORDER — FUROSEMIDE 40 MG PO TABS: 40.0000 mg | ORAL_TABLET | Freq: Two times a day (BID) | ORAL | Status: DC

## 2013-03-22 ENCOUNTER — Other Ambulatory Visit: Payer: Self-pay | Admitting: Cardiology

## 2013-04-23 ENCOUNTER — Encounter: Payer: Self-pay | Admitting: Pulmonary Disease

## 2013-04-23 ENCOUNTER — Ambulatory Visit (INDEPENDENT_AMBULATORY_CARE_PROVIDER_SITE_OTHER): Payer: Medicare Other | Admitting: Pulmonary Disease

## 2013-04-23 VITALS — BP 132/82 | HR 77 | Temp 98.0°F | Ht 69.0 in | Wt 301.6 lb

## 2013-04-23 DIAGNOSIS — J45909 Unspecified asthma, uncomplicated: Secondary | ICD-10-CM

## 2013-04-23 DIAGNOSIS — J45901 Unspecified asthma with (acute) exacerbation: Secondary | ICD-10-CM

## 2013-04-23 MED ORDER — LEVOFLOXACIN 750 MG PO TABS: 750.0000 mg | ORAL_TABLET | Freq: Every day | ORAL | Status: DC

## 2013-04-23 MED ORDER — PREDNISONE 10 MG PO TABS: ORAL_TABLET | ORAL | Status: DC

## 2013-04-23 NOTE — Progress Notes (Signed)
  Subjective:    Patient ID: [name], male    DOB: December 26, 1930, 79 y.o.   MRN: [id number]  HPI The patient comes in today for an acute sick visit.  He is usually followed by Dr. Delford Field for chronic obstructive bronchitis, and gives a 5 week history of persistent cough with milky sputum, increased chest congestion, wheezing, and increased shortness of breath.  He was treated by his primary doctor her with a course of prednisone, and tells me that a chest x-ray with him was unremarkable.  His weight is actually down from the last visit, and he tells me that his lower extremity edema has been well controlled.  He is not having any fevers, chills, or sweats.   Review of Systems  Constitutional: Negative for fever and unexpected weight change.  HENT: Positive for congestion, rhinorrhea and postnasal drip. Negative for ear pain, nosebleeds, sore throat, sneezing, trouble swallowing, dental problem and sinus pressure.   Eyes: Negative for redness and itching.  Respiratory: Positive for cough, chest tightness, shortness of breath and wheezing.   Cardiovascular: Negative for palpitations and leg swelling.  Gastrointestinal: Negative for nausea and vomiting.  Genitourinary: Negative for dysuria.  Musculoskeletal: Negative for joint swelling.  Skin: Negative for rash.  Neurological: Negative for headaches.  Hematological: Does not bruise/bleed easily.  Psychiatric/Behavioral: Negative for dysphoric mood. The patient is not nervous/anxious.        Objective:   Physical Exam Obese male in no acute distress Nose without purulence or discharge noted Oropharynx clear Neck without lymphadenopathy or thyromegaly Chest with basilar crackles right greater than left, a few rhonchi but no true wheezing, loud upper airway pseudo-wheezing noted Cardiac exam with regular rate and rhythm Lower extremities with no significant edema, no cyanosis alert and oriented, moves all 4 extremities.        Assessment & Plan:

## 2013-04-23 NOTE — Addendum Note (Signed)
Addended by: Maisie Fus on: 04/23/2013 11:28 AM   Modules accepted: Orders

## 2013-04-23 NOTE — Assessment & Plan Note (Signed)
The patient is having a persistent cough with consistent mucus and chest congestion, and this is leading toward increased shortness of breath.  He is not wheezing on exam, but does have rhonchi and basilar crackles.  He has no significant edema, and his weight is actually down from the last visit, and therefore I suspect this is not related to his congestive heart failure.  I will treat him with a course of antibiotics as well as prednisone, and see if he improves.

## 2013-04-23 NOTE — Patient Instructions (Addendum)
Will treat you with a course of levaquin 750mg  one a day for 7 days Prednisone taper over 8 days. Stay inside and do not do anything active until you are better. Will need to wait on flu shot since you are sick. followup with Dr. Delford Field in 2 weeks, but call us if you are not getting better next 48-72 hrs.

## 2013-05-03 ENCOUNTER — Other Ambulatory Visit: Payer: Self-pay | Admitting: Cardiology

## 2013-05-15 ENCOUNTER — Encounter: Payer: Self-pay | Admitting: Critical Care Medicine

## 2013-05-15 ENCOUNTER — Ambulatory Visit (INDEPENDENT_AMBULATORY_CARE_PROVIDER_SITE_OTHER): Payer: Medicare Other | Admitting: Critical Care Medicine

## 2013-05-15 VITALS — BP 120/70 | HR 84 | Temp 98.1°F | Ht 71.0 in | Wt 302.6 lb

## 2013-05-15 DIAGNOSIS — J45901 Unspecified asthma with (acute) exacerbation: Secondary | ICD-10-CM

## 2013-05-15 DIAGNOSIS — R079 Chest pain, unspecified: Secondary | ICD-10-CM

## 2013-05-15 DIAGNOSIS — J45909 Unspecified asthma, uncomplicated: Secondary | ICD-10-CM

## 2013-05-15 DIAGNOSIS — J449 Chronic obstructive pulmonary disease, unspecified: Secondary | ICD-10-CM

## 2013-05-15 MED ORDER — PREDNISONE 10 MG PO TABS: ORAL_TABLET | ORAL | Status: DC

## 2013-05-15 MED ORDER — MOXIFLOXACIN HCL 400 MG PO TABS: 400.0000 mg | ORAL_TABLET | Freq: Every day | ORAL | Status: DC

## 2013-05-15 NOTE — Progress Notes (Signed)
Subjective:    Patient ID: [name], male    DOB: 06-Feb-1931, 79 y.o.   MRN: [id number]  HPI Comments:   07/03/2012 Since last OV pt had to have stents to Coronaries.  12/2011.  VG and RCA.  (2 DES). Pt notes dyspnea is better with this. Pt is still dyspneic with exertion.  Notes some cough. No real wheeze.  Notes some edema in feet. Mucus is now yellow.  No real qhs dyspnea  05/15/2013 Chief Complaint  Patient presents with  . 2 wk follow up    Breathing is unchanged.  Has increased SOB at rest and with exertion, wheezing, ches ttightness, and prod cough with milky mucus.     Has been seen twice for acute OVs,  12/2012 and 04/23/13 . At last ov [facility] rx levaquin/pred pulse x 8d. This helped, and then worsened.  Notes more dyspnea at rest.  Mucus is milky and yellow. No blood.  No chest pain. On oxygen QHS and prn daytime. Uses alb neb 3-4 times daily on average    Past Medical History  Diagnosis Date  . CAD (coronary artery disease)     a. s/p CABG x 3 in 1995;  b.Lexi MV 10/2011: NL EF, No isch/infarct;;  d.  NSTEMI 12/2011 -> Cath 6/21: LM 20-30, LAD 90, RI 95, LCX 45m, RCA 90/128m (treated w/ 2.0x16 Promus DES), VG->RCA ok, VG->RI 70-22m (treated w/ 4.0x28 Promus), LIMA->LAD ok, EF 55-60%.  Marland Kitchen HTN (hypertension)   . HLD (hyperlipidemia)   . Obesity   . Syncope   . COPD (chronic obstructive pulmonary disease)   . Hypoxia   . History of pneumonia     "once"  . HOH (hard of hearing)     left ear  . Asthma   . Sleep apnea   . Arthritis     "all over"  . Dyspnea   . Orthopnea     a. Has been sleeping in a recliner x 30 yrs  . Chronic diastolic CHF (congestive heart failure)     a. Echo 10/2011: EF 55-60%;  b. elevated EDP req diuresis.     No family history on file.   History   Social History  . Marital Status: Married    Spouse Name: N/A    Number of Children: N/A  . Years of Education: N/A   Occupational History  . Retired     English as a second language teacher    Social History Main Topics  . Smoking status: Never Smoker   . Smokeless tobacco: Never Used  . Alcohol Use: Yes     Comment: 01/06/12 "used to drink a little; last drink ~ 15-20 yr ago"  . Drug Use: No  . Sexual Activity: Yes   Other Topics Concern  . Not on file   Social History Narrative  . No narrative on file     No Known Allergies   Outpatient Prescriptions Prior to Visit  Medication Sig Dispense Refill  . albuterol (PROVENTIL) (2.5 MG/3ML) 0.083% nebulizer solution Take 2.5 mg by nebulization every 6 (six) hours as needed for wheezing.      Marland Kitchen ARTIFICIAL TEAR OP Place 2 drops into both eyes daily as needed. For dry eyes      . aspirin 325 MG tablet Take 325 mg by mouth daily.        Marland Kitchen atorvastatin (LIPITOR) 80 MG tablet TAKE ONE TABLET BY MOUTH AT BEDTIME.  90 tablet  0  . clopidogrel (PLAVIX) 75 MG  tablet TAKE ONE TABLET BY MOUTH EVERY DAY WITH  BREAKFAST  30 tablet  2  . furosemide (LASIX) 40 MG tablet Take 1 tablet (40 mg total) by mouth 2 (two) times daily.  60 tablet  3  . hydrALAZINE (APRESOLINE) 25 MG tablet Take 25 mg by mouth.       . isosorbide mononitrate (IMDUR) 30 MG 24 hr tablet TAKE ONE TABLET BY MOUTH ONCE DAILY.  30 tablet  1  . KLOR-CON M20 20 MEQ tablet TAKE ONE TABLET BY MOUTH TWICE DAILY  60 tablet  1  . losartan (COZAAR) 100 MG tablet TAKE ONE TABLET BY MOUTH DAILY  30 tablet  3  . Meclizine HCl (BONINE) 25 MG CHEW Chew 1 tablet by mouth 2 (two) times daily.        . montelukast (SINGULAIR) 10 MG tablet Take 10 mg by mouth at bedtime.      . Multiple Vitamins-Minerals (MULTIVITAMIN & MINERAL PO) Take 1 tablet by mouth daily.        . nebivolol (BYSTOLIC) 10 MG tablet Take 1 tablet (10 mg total) by mouth daily.  30 tablet  8  . nitroGLYCERIN (NITROSTAT) 0.4 MG SL tablet Place 1 tablet (0.4 mg total) under the tongue every 5 (five) minutes as needed. Call 9-1-1 after taking your third.  25 tablet  3  . levofloxacin (LEVAQUIN) 750 MG tablet Take 1 tablet  (750 mg total) by mouth daily.  7 tablet  0  . predniSONE (DELTASONE) 10 MG tablet Take 4 tabs po x 2 days, then 3 x 2 days, then 2 x 2 days, then 1 x 2 days then stop.  20 tablet  0   No facility-administered medications prior to visit.       Review of Systems Constitutional:   No  weight loss, night sweats,  Fevers, chills, fatigue, lassitude. HEENT:   No headaches,  Difficulty swallowing,  Tooth/dental problems,  Sore throat,                No sneezing, itching, ear ache, nasal congestion, post nasal drip,   CV:  Notes chest pain,  +Orthopnea,   PND,  No anasarca, dizziness, palpitations  +LE edema  GI  No heartburn, indigestion, abdominal pain, nausea, vomiting, diarrhea, change in bowel habits, loss of appetite  Resp: Notes shortness of breath with exertion not at rest.  No excess mucus, no productive cough,  No non-productive cough,  No coughing up of blood.  No change in color of mucus.  No wheezing.  No chest wall deformity Neg snoring or hypersolomence  Skin: no rash or lesions.  GU: no dysuria, change in color of urine, no urgency or frequency.  No flank pain.  MS:  No joint pain or swelling.  No decreased range of motion.  No back pain.  Psych:  No change in mood or affect. No depression or anxiety.  No memory loss.  Objective:   Physical Exam BP 120/70  Pulse 84  Temp(Src) 98.1 F (36.7 C) (Oral)  Ht 5\' 11"  (1.803 m)  Wt 302 lb 9.6 oz (137.258 kg)  BMI 42.22 kg/m2  SpO2 94%  Gen:  obese , in no distress, anxious  ENT: No lesions,  mouth clear,  oropharynx clear, no postnasal drip, class 2 airway   Neck: no JVD, no TMG, no carotid bruits  Lungs: No use of accessory muscles, no dullness to percussion, exp wheezes  Cardiovascular: RRR, heart sounds normal, no murmur or gallops,  no  peripheral edema  Abdomen: soft and NT, no HSM,  BS normal, obese   Musculoskeletal: No deformities, no cyanosis or clubbing  Neuro: alert, non focal  Skin: Warm, no lesions  or rashes          Assessment & Plan:    Obstructive chronic bronchitis with exacerbation Recurrent exacerbation of small airway disease with improvement previously with change to bystolic  Plan Prednisone 10mg  Take 4 for three days 3 for three days 2 for three days 1 for three days and stop Avelox 400mg  daily for 5 days Use albuterol in nebulizer 3-4 times daily Stay on oxygen at night and as needed daytime Return 2 months Return 1 week for flu vaccine     Updated Medication List Outpatient Encounter Prescriptions as of 05/15/2013  Medication Sig Dispense Refill  . albuterol (PROVENTIL) (2.5 MG/3ML) 0.083% nebulizer solution Take 2.5 mg by nebulization every 6 (six) hours as needed for wheezing.      Marland Kitchen ARTIFICIAL TEAR OP Place 2 drops into both eyes daily as needed. For dry eyes      . aspirin 325 MG tablet Take 325 mg by mouth daily.        Marland Kitchen atorvastatin (LIPITOR) 80 MG tablet TAKE ONE TABLET BY MOUTH AT BEDTIME.  90 tablet  0  . clopidogrel (PLAVIX) 75 MG tablet TAKE ONE TABLET BY MOUTH EVERY DAY WITH  BREAKFAST  30 tablet  2  . furosemide (LASIX) 40 MG tablet Take 1 tablet (40 mg total) by mouth 2 (two) times daily.  60 tablet  3  . hydrALAZINE (APRESOLINE) 25 MG tablet Take 25 mg by mouth.       . isosorbide mononitrate (IMDUR) 30 MG 24 hr tablet TAKE ONE TABLET BY MOUTH ONCE DAILY.  30 tablet  1  . KLOR-CON M20 20 MEQ tablet TAKE ONE TABLET BY MOUTH TWICE DAILY  60 tablet  1  . losartan (COZAAR) 100 MG tablet TAKE ONE TABLET BY MOUTH DAILY  30 tablet  3  . Meclizine HCl (BONINE) 25 MG CHEW Chew 1 tablet by mouth 2 (two) times daily.        . montelukast (SINGULAIR) 10 MG tablet Take 10 mg by mouth at bedtime.      . Multiple Vitamins-Minerals (MULTIVITAMIN & MINERAL PO) Take 1 tablet by mouth daily.        . nebivolol (BYSTOLIC) 10 MG tablet Take 1 tablet (10 mg total) by mouth daily.  30 tablet  8  . nitroGLYCERIN (NITROSTAT) 0.4 MG SL tablet Place 1 tablet (0.4 mg  total) under the tongue every 5 (five) minutes as needed. Call 9-1-1 after taking your third.  25 tablet  3  . moxifloxacin (AVELOX) 400 MG tablet Take 1 tablet (400 mg total) by mouth daily.  5 tablet  0  . predniSONE (DELTASONE) 10 MG tablet Take 4 for three days 3 for three days 2 for three days 1 for three days and stop  30 tablet  0  . [DISCONTINUED] levofloxacin (LEVAQUIN) 750 MG tablet Take 1 tablet (750 mg total) by mouth daily.  7 tablet  0  . [DISCONTINUED] predniSONE (DELTASONE) 10 MG tablet Take 4 tabs po x 2 days, then 3 x 2 days, then 2 x 2 days, then 1 x 2 days then stop.  20 tablet  0   No facility-administered encounter medications on file as of 05/15/2013.

## 2013-05-15 NOTE — Patient Instructions (Signed)
Prednisone 10mg  Take 4 for three days 3 for three days 2 for three days 1 for three days and stop Avelox 400mg  daily for 5 days Use albuterol in nebulizer 3-4 times daily Stay on oxygen at night and as needed daytime Return 2 months Return 1 week for flu vaccine

## 2013-05-17 NOTE — Assessment & Plan Note (Addendum)
Recurrent exacerbation of small airway disease with improvement previously with change to bystolic  Plan Prednisone 10mg  Take 4 for three days 3 for three days 2 for three days 1 for three days and stop Avelox 400mg  daily for 5 days Use albuterol in nebulizer 3-4 times daily Stay on oxygen at night and as needed daytime Return 2 months Return 1 week for flu vaccine

## 2013-05-22 ENCOUNTER — Ambulatory Visit: Payer: Medicare Other

## 2013-07-02 ENCOUNTER — Emergency Department (HOSPITAL_COMMUNITY): Payer: Medicare Other

## 2013-07-02 ENCOUNTER — Inpatient Hospital Stay (HOSPITAL_COMMUNITY)
Admission: EM | Admit: 2013-07-02 | Discharge: 2013-07-05 | DRG: 190 | Disposition: A | Discharge status: Home Health | Payer: Medicare Other | Attending: Internal Medicine | Admitting: Internal Medicine

## 2013-07-02 ENCOUNTER — Encounter (HOSPITAL_COMMUNITY): Payer: Self-pay | Admitting: Emergency Medicine

## 2013-07-02 DIAGNOSIS — I2510 Atherosclerotic heart disease of native coronary artery without angina pectoris: Secondary | ICD-10-CM | POA: Diagnosis present

## 2013-07-02 DIAGNOSIS — Z79899 Other long term (current) drug therapy: Secondary | ICD-10-CM

## 2013-07-02 DIAGNOSIS — I10 Essential (primary) hypertension: Secondary | ICD-10-CM | POA: Diagnosis present

## 2013-07-02 DIAGNOSIS — I25810 Atherosclerosis of coronary artery bypass graft(s) without angina pectoris: Secondary | ICD-10-CM | POA: Diagnosis present

## 2013-07-02 DIAGNOSIS — R0602 Shortness of breath: Secondary | ICD-10-CM | POA: Diagnosis not present

## 2013-07-02 DIAGNOSIS — I5032 Chronic diastolic (congestive) heart failure: Secondary | ICD-10-CM | POA: Diagnosis present

## 2013-07-02 DIAGNOSIS — J441 Chronic obstructive pulmonary disease with (acute) exacerbation: Principal | ICD-10-CM | POA: Diagnosis present

## 2013-07-02 DIAGNOSIS — E1129 Type 2 diabetes mellitus with other diabetic kidney complication: Secondary | ICD-10-CM | POA: Diagnosis present

## 2013-07-02 DIAGNOSIS — E119 Type 2 diabetes mellitus without complications: Secondary | ICD-10-CM | POA: Diagnosis present

## 2013-07-02 DIAGNOSIS — Z951 Presence of aortocoronary bypass graft: Secondary | ICD-10-CM

## 2013-07-02 DIAGNOSIS — J9620 Acute and chronic respiratory failure, unspecified whether with hypoxia or hypercapnia: Secondary | ICD-10-CM | POA: Diagnosis present

## 2013-07-02 DIAGNOSIS — J40 Bronchitis, not specified as acute or chronic: Secondary | ICD-10-CM

## 2013-07-02 DIAGNOSIS — E785 Hyperlipidemia, unspecified: Secondary | ICD-10-CM | POA: Diagnosis present

## 2013-07-02 DIAGNOSIS — N179 Acute kidney failure, unspecified: Secondary | ICD-10-CM | POA: Diagnosis present

## 2013-07-02 DIAGNOSIS — I509 Heart failure, unspecified: Secondary | ICD-10-CM | POA: Diagnosis present

## 2013-07-02 DIAGNOSIS — Z7982 Long term (current) use of aspirin: Secondary | ICD-10-CM

## 2013-07-02 DIAGNOSIS — E669 Obesity, unspecified: Secondary | ICD-10-CM | POA: Diagnosis present

## 2013-07-02 DIAGNOSIS — G4730 Sleep apnea, unspecified: Secondary | ICD-10-CM | POA: Diagnosis present

## 2013-07-02 DIAGNOSIS — E875 Hyperkalemia: Secondary | ICD-10-CM | POA: Diagnosis present

## 2013-07-02 DIAGNOSIS — Z6841 Body Mass Index (BMI) 40.0 and over, adult: Secondary | ICD-10-CM

## 2013-07-02 DIAGNOSIS — Z9981 Dependence on supplemental oxygen: Secondary | ICD-10-CM

## 2013-07-02 LAB — COMPREHENSIVE METABOLIC PANEL
ALT: 18 U/L (ref 0–53)
AST: 22 U/L (ref 0–37)
Alkaline Phosphatase: 87 U/L (ref 39–117)
CO2: 35 mEq/L — ABNORMAL HIGH (ref 19–32)
Calcium: 9.2 mg/dL (ref 8.4–10.5)
Creatinine, Ser: 1.48 mg/dL — ABNORMAL HIGH (ref 0.50–1.35)
GFR calc Af Amer: 50 mL/min — ABNORMAL LOW (ref >90)
GFR calc non Af Amer: 43 mL/min — ABNORMAL LOW (ref >90)
Glucose, Bld: 187 mg/dL — ABNORMAL HIGH (ref 70–99)
Potassium: 4 mEq/L (ref 3.5–5.1)
Total Bilirubin: 0.5 mg/dL (ref 0.3–1.2)

## 2013-07-02 LAB — GLUCOSE, CAPILLARY: Glucose-Capillary: 219 mg/dL — ABNORMAL HIGH (ref 70–99)

## 2013-07-02 LAB — MAGNESIUM: Magnesium: 2.2 mg/dL (ref 1.5–2.5)

## 2013-07-02 LAB — CBC WITH DIFFERENTIAL/PLATELET
Basophils Absolute: 0 10*3/uL (ref 0.0–0.1)
Eosinophils Relative: 3 % (ref 0–5)
HCT: 44 % (ref 39.0–52.0)
Lymphocytes Relative: 13 % (ref 12–46)
Lymphs Abs: 1.3 10*3/uL (ref 0.7–4.0)
MCV: 95.7 fL (ref 78.0–100.0)
Monocytes Absolute: 0.3 10*3/uL (ref 0.1–1.0)
Monocytes Relative: 3 % (ref 3–12)
RBC: 4.6 MIL/uL (ref 4.22–5.81)
WBC: 9.8 10*3/uL (ref 4.0–10.5)

## 2013-07-02 LAB — PRO B NATRIURETIC PEPTIDE: Pro B Natriuretic peptide (BNP): 185.6 pg/mL (ref 0–450)

## 2013-07-02 MED ORDER — SODIUM CHLORIDE 0.9 % IJ SOLN: 3.0000 mL | INTRAMUSCULAR | Status: DC | PRN

## 2013-07-02 MED ORDER — DEXTROSE 5 % IV SOLN
500.0000 mg | Freq: Once | INTRAVENOUS | Status: DC
  Administered 2013-07-02: 500 mg via INTRAVENOUS

## 2013-07-02 MED ORDER — NITROGLYCERIN 0.4 MG SL SUBL: 0.4000 mg | SUBLINGUAL_TABLET | SUBLINGUAL | Status: DC | PRN

## 2013-07-02 MED ORDER — ALBUTEROL SULFATE (5 MG/ML) 0.5% IN NEBU: 5.0000 mg | INHALATION_SOLUTION | RESPIRATORY_TRACT | Status: DC | PRN

## 2013-07-02 MED ORDER — NEBIVOLOL HCL 10 MG PO TABS
10.0000 mg | ORAL_TABLET | Freq: Every day | ORAL | Status: DC
  Administered 2013-07-03 – 2013-07-05 (×3): 10 mg via ORAL
  Filled 2013-07-02 (×3): qty 1

## 2013-07-02 MED ORDER — ALBUTEROL (5 MG/ML) CONTINUOUS INHALATION SOLN
15.0000 mg/h | INHALATION_SOLUTION | Freq: Once | RESPIRATORY_TRACT | Status: AC
  Administered 2013-07-02: 15 mg/h via RESPIRATORY_TRACT
  Filled 2013-07-02: qty 20

## 2013-07-02 MED ORDER — TIOTROPIUM BROMIDE MONOHYDRATE 18 MCG IN CAPS
18.0000 ug | ORAL_CAPSULE | Freq: Every day | RESPIRATORY_TRACT | Status: DC
  Administered 2013-07-03 – 2013-07-05 (×3): 18 ug via RESPIRATORY_TRACT
  Filled 2013-07-02: qty 5

## 2013-07-02 MED ORDER — FUROSEMIDE 10 MG/ML IJ SOLN
60.0000 mg | Freq: Once | INTRAMUSCULAR | Status: AC
  Administered 2013-07-02: 60 mg via INTRAVENOUS
  Filled 2013-07-02: qty 8

## 2013-07-02 MED ORDER — ASPIRIN 325 MG PO TABS
325.0000 mg | ORAL_TABLET | Freq: Every day | ORAL | Status: DC
  Administered 2013-07-03 – 2013-07-05 (×3): 325 mg via ORAL
  Filled 2013-07-02 (×3): qty 1

## 2013-07-02 MED ORDER — METHYLPREDNISOLONE SODIUM SUCC 125 MG IJ SOLR
125.0000 mg | Freq: Once | INTRAMUSCULAR | Status: AC
  Administered 2013-07-02: 125 mg via INTRAVENOUS
  Filled 2013-07-02: qty 2

## 2013-07-02 MED ORDER — CLOPIDOGREL BISULFATE 75 MG PO TABS
75.0000 mg | ORAL_TABLET | Freq: Every day | ORAL | Status: DC
  Administered 2013-07-03 – 2013-07-05 (×3): 75 mg via ORAL
  Filled 2013-07-02 (×4): qty 1

## 2013-07-02 MED ORDER — BIOTENE DRY MOUTH MT LIQD
15.0000 mL | Freq: Two times a day (BID) | OROMUCOSAL | Status: DC
  Administered 2013-07-02 – 2013-07-04 (×5): 15 mL via OROMUCOSAL

## 2013-07-02 MED ORDER — FLUTICASONE PROPIONATE HFA 110 MCG/ACT IN AERO
2.0000 | INHALATION_SPRAY | Freq: Two times a day (BID) | RESPIRATORY_TRACT | Status: DC
  Administered 2013-07-02 – 2013-07-05 (×6): 2 via RESPIRATORY_TRACT
  Filled 2013-07-02: qty 12

## 2013-07-02 MED ORDER — ALBUTEROL SULFATE (5 MG/ML) 0.5% IN NEBU
2.5000 mg | INHALATION_SOLUTION | RESPIRATORY_TRACT | Status: DC | PRN
  Filled 2013-07-02: qty 0.5

## 2013-07-02 MED ORDER — HYDRALAZINE HCL 25 MG PO TABS
25.0000 mg | ORAL_TABLET | Freq: Three times a day (TID) | ORAL | Status: DC
  Administered 2013-07-02 – 2013-07-04 (×7): 25 mg via ORAL
  Filled 2013-07-02 (×12): qty 1

## 2013-07-02 MED ORDER — HEPARIN SODIUM (PORCINE) 5000 UNIT/ML IJ SOLN
5000.0000 [IU] | Freq: Three times a day (TID) | INTRAMUSCULAR | Status: DC
  Administered 2013-07-02 – 2013-07-05 (×8): 5000 [IU] via SUBCUTANEOUS
  Filled 2013-07-02 (×11): qty 1

## 2013-07-02 MED ORDER — FUROSEMIDE 40 MG PO TABS
40.0000 mg | ORAL_TABLET | Freq: Two times a day (BID) | ORAL | Status: DC
  Administered 2013-07-02 – 2013-07-03 (×3): 40 mg via ORAL
  Filled 2013-07-02 (×4): qty 1

## 2013-07-02 MED ORDER — IPRATROPIUM BROMIDE 0.02 % IN SOLN
0.5000 mg | Freq: Once | RESPIRATORY_TRACT | Status: AC
  Administered 2013-07-02: 0.5 mg via RESPIRATORY_TRACT
  Filled 2013-07-02: qty 2.5

## 2013-07-02 MED ORDER — ATORVASTATIN CALCIUM 80 MG PO TABS
80.0000 mg | ORAL_TABLET | Freq: Every day | ORAL | Status: DC
  Administered 2013-07-02 – 2013-07-04 (×3): 80 mg via ORAL
  Filled 2013-07-02 (×4): qty 1

## 2013-07-02 MED ORDER — INSULIN ASPART 100 UNIT/ML ~~LOC~~ SOLN
0.0000 [IU] | Freq: Every day | SUBCUTANEOUS | Status: DC
  Administered 2013-07-02: 22:00:00 3 [IU] via SUBCUTANEOUS

## 2013-07-02 MED ORDER — DEXTROSE 5 % IV SOLN
500.0000 mg | INTRAVENOUS | Status: DC
  Administered 2013-07-03: 500 mg via INTRAVENOUS
  Filled 2013-07-02 (×2): qty 500

## 2013-07-02 MED ORDER — ALBUTEROL SULFATE (5 MG/ML) 0.5% IN NEBU
5.0000 mg | INHALATION_SOLUTION | Freq: Once | RESPIRATORY_TRACT | Status: AC
  Administered 2013-07-02: 5 mg via RESPIRATORY_TRACT
  Filled 2013-07-02: qty 1

## 2013-07-02 MED ORDER — CEFTRIAXONE SODIUM 1 G IJ SOLR
1.0000 g | INTRAMUSCULAR | Status: DC
  Administered 2013-07-03: 14:00:00 1 g via INTRAVENOUS
  Filled 2013-07-02 (×2): qty 10

## 2013-07-02 MED ORDER — MONTELUKAST SODIUM 10 MG PO TABS
10.0000 mg | ORAL_TABLET | Freq: Every day | ORAL | Status: DC
Start: 2013-07-02 — End: 2013-07-05
  Administered 2013-07-02 – 2013-07-04 (×3): 10 mg via ORAL
  Filled 2013-07-02 (×4): qty 1

## 2013-07-02 MED ORDER — NITROGLYCERIN 2 % TD OINT
1.0000 [in_us] | TOPICAL_OINTMENT | Freq: Once | TRANSDERMAL | Status: AC
  Administered 2013-07-02: 1 [in_us] via TOPICAL
  Filled 2013-07-02: qty 30

## 2013-07-02 MED ORDER — POTASSIUM CHLORIDE CRYS ER 20 MEQ PO TBCR
40.0000 meq | EXTENDED_RELEASE_TABLET | Freq: Once | ORAL | Status: AC
  Administered 2013-07-02: 40 meq via ORAL
  Filled 2013-07-02: qty 2

## 2013-07-02 MED ORDER — INSULIN ASPART 100 UNIT/ML ~~LOC~~ SOLN
0.0000 [IU] | Freq: Three times a day (TID) | SUBCUTANEOUS | Status: DC
  Administered 2013-07-02: 5 [IU] via SUBCUTANEOUS
  Administered 2013-07-03 – 2013-07-04 (×4): 2 [IU] via SUBCUTANEOUS
  Administered 2013-07-04: 13:00:00 via SUBCUTANEOUS

## 2013-07-02 MED ORDER — MECLIZINE HCL 25 MG PO TABS
25.0000 mg | ORAL_TABLET | Freq: Two times a day (BID) | ORAL | Status: DC
  Administered 2013-07-02 – 2013-07-05 (×6): 25 mg via ORAL
  Filled 2013-07-02 (×7): qty 1

## 2013-07-02 MED ORDER — LOSARTAN POTASSIUM 50 MG PO TABS
100.0000 mg | ORAL_TABLET | Freq: Every day | ORAL | Status: DC
  Administered 2013-07-03 – 2013-07-05 (×3): 100 mg via ORAL
  Filled 2013-07-02 (×3): qty 2

## 2013-07-02 MED ORDER — ALBUTEROL SULFATE (5 MG/ML) 0.5% IN NEBU
2.5000 mg | INHALATION_SOLUTION | Freq: Four times a day (QID) | RESPIRATORY_TRACT | Status: DC
  Administered 2013-07-02 (×2): 2.5 mg via RESPIRATORY_TRACT
  Filled 2013-07-02: qty 0.5

## 2013-07-02 MED ORDER — DEXTROSE 5 % IV SOLN
1.0000 g | Freq: Once | INTRAVENOUS | Status: AC
  Administered 2013-07-02: 1 g via INTRAVENOUS
  Filled 2013-07-02: qty 10

## 2013-07-02 MED ORDER — ISOSORBIDE MONONITRATE ER 30 MG PO TB24
30.0000 mg | ORAL_TABLET | Freq: Every day | ORAL | Status: DC
  Administered 2013-07-03 – 2013-07-05 (×3): 30 mg via ORAL
  Filled 2013-07-02 (×4): qty 1

## 2013-07-02 MED ORDER — SODIUM CHLORIDE 0.9 % IV SOLN: 250.0000 mL | INTRAVENOUS | Status: DC | PRN

## 2013-07-02 MED ORDER — SODIUM CHLORIDE 0.9 % IJ SOLN
3.0000 mL | Freq: Two times a day (BID) | INTRAMUSCULAR | Status: DC
  Administered 2013-07-03 – 2013-07-04 (×2): 3 mL via INTRAVENOUS

## 2013-07-02 MED ORDER — METHYLPREDNISOLONE SODIUM SUCC 40 MG IJ SOLR
40.0000 mg | Freq: Three times a day (TID) | INTRAMUSCULAR | Status: DC
  Administered 2013-07-03 (×2): 40 mg via INTRAVENOUS
  Filled 2013-07-02 (×4): qty 1

## 2013-07-02 NOTE — Progress Notes (Signed)
Pt had non-productive dry cough. Good effort. Pt was instructed that when sputum was produced to spit in appropriate container and notify nurse.

## 2013-07-02 NOTE — Progress Notes (Signed)
ANTIBIOTIC CONSULT NOTE - INITIAL  Pharmacy Consult for Ceftriaxone Indication: AECOPD  No Known Allergies  Patient Measurements:     Vital Signs: Temp: 98.6 F (37 C) (12/15 1104) Temp src: Oral (12/15 1104) BP: 108/64 mmHg (12/15 1445) Pulse Rate: 107 (12/15 1445)  Labs:  Recent Labs  07/02/13 1142  WBC 9.8  HGB 14.3  PLT 191  CREATININE 1.48*   The CrCl is unknown because both a height and weight (above a minimum accepted value) are required for this calculation. No results found for this basename: VANCOTROUGH, VANCOPEAK, VANCORANDOM, GENTTROUGH, GENTPEAK, GENTRANDOM, TOBRATROUGH, TOBRAPEAK, TOBRARND, AMIKACINPEAK, AMIKACINTROU, AMIKACIN,  in the last 72 hours   Microbiology: No results found for this or any previous visit (from the past 720 hour(s)).  Medical History: Past Medical History  Diagnosis Date  . CAD (coronary artery disease)     a. s/p CABG x 3 in 1995;  b.Lexi MV 10/2011: NL EF, No isch/infarct;;  d.  NSTEMI 12/2011 -> Cath 6/21: LM 20-30, LAD 90, RI 95, LCX 76m, RCA 90/141m (treated w/ 2.0x16 Promus DES), VG->RCA ok, VG->RI 70-25m (treated w/ 4.0x28 Promus), LIMA->LAD ok, EF 55-60%.  Marland Kitchen HTN (hypertension)   . HLD (hyperlipidemia)   . Obesity   . Syncope   . COPD (chronic obstructive pulmonary disease)   . Hypoxia   . History of pneumonia     "once"  . HOH (hard of hearing)     left ear  . Asthma   . Sleep apnea   . Arthritis     "all over"  . Dyspnea   . Orthopnea     a. Has been sleeping in a recliner x 30 yrs  . Chronic diastolic CHF (congestive heart failure)     a. Echo 10/2011: EF 55-60%;  b. elevated EDP req diuresis.    Assessment: 34 yoM with PMH of DM, HTN, CAD s/p CABG, and COPD on home oxygen presents with worsening shortness of breath and increased cough with productive sputum.  Starting Azithromycin per MD and Ceftriaxone per pharmacy for acute exacerbation of COPD.  WBC WNL  Afebrile  SCr 1.48  Sputum culture  ordered  Goal of Therapy:  Eradication of infection  Plan:  1.  Ceftriaxone 1g IV q24h.   2.  Pharmacy will sign off and follow peripherally.  Clance Boll 07/02/2013,3:35 PM

## 2013-07-02 NOTE — ED Provider Notes (Signed)
CSN: [id number]     Arrival date & time 07/02/13  1053 History   First MD Initiated Contact with Patient 07/02/13 1112     Chief Complaint  Patient presents with  . Shortness of Breath   (Consider location/radiation/quality/duration/timing/severity/associated sxs/prior Treatment) HPI Patient reports he's been getting progressively more short of breath over the past 2 months but got acutely worse today. He states he has been wheezing. He describes a cough with white and rarely yellow mucus. He states he had chest pain yesterday he indicates the left lower chest area. He states it lasted 10 minutes. He states it was initially sharp and then became burning. He states he's had some swelling of his legs and his abdomen swells when he eats. He is unsure of fever but states when he gets up at night he gets chills until he gets back into bed. Patient was given IV solumedrol (note not seen when I wrote his orders), and a duo neb via EMS.  PCP Dr Tenny Craw [facility], Dr Delford Field  Past Medical History  Diagnosis Date  . CAD (coronary artery disease)     a. s/p CABG x 3 in 1995;  b.Lexi MV 10/2011: NL EF, No isch/infarct;;  d.  NSTEMI 12/2011 -> Cath 6/21: LM 20-30, LAD 90, RI 95, LCX 39m, RCA 90/197m (treated w/ 2.0x16 Promus DES), VG->RCA ok, VG->RI 70-68m (treated w/ 4.0x28 Promus), LIMA->LAD ok, EF 55-60%.  Marland Kitchen HTN (hypertension)   . HLD (hyperlipidemia)   . Obesity   . Syncope   . COPD (chronic obstructive pulmonary disease)   . Hypoxia   . History of pneumonia     "once"  . HOH (hard of hearing)     left ear  . Asthma   . Sleep apnea   . Arthritis     "all over"  . Dyspnea   . Orthopnea     a. Has been sleeping in a recliner x 30 yrs  . Chronic diastolic CHF (congestive heart failure)     a. Echo 10/2011: EF 55-60%;  b. elevated EDP req diuresis.   Past Surgical History  Procedure Laterality Date  . Functional endoscopic sinus surgery  2006  . Hydrocele excision   2005  . Prostate surgery  1998  . Inguinal hernia repair  2006    right  . Coronary artery bypass graft  1995    CABG X3  . Excisional hemorrhoidectomy      "twice cut them out; burnt them out once"   History reviewed. No pertinent family history. History  Substance Use Topics  . Smoking status: Never Smoker   . Smokeless tobacco: Never Used  . Alcohol Use: Yes     Comment: 01/06/12 "used to drink a little; last drink ~ 15-20 yr ago"  lives at home Lives with son Home oxygen 3 lpm [place] + second hand smoke from wife who smoked 2-3 ppd  Review of Systems  All other systems reviewed and are negative.    Allergies  Review of patient's allergies indicates no known allergies.  Home Medications   Current Outpatient Rx  Name  Route  Sig  Dispense  Refill  . albuterol (PROVENTIL) (2.5 MG/3ML) 0.083% nebulizer solution   Nebulization   Take 2.5 mg by nebulization every 6 (six) hours as needed for wheezing.         Marland Kitchen ARTIFICIAL TEAR OP   Both Eyes   Place 2 drops into both eyes daily as needed. For dry  eyes         . aspirin 325 MG tablet   Oral   Take 325 mg by mouth daily.           Marland Kitchen atorvastatin (LIPITOR) 80 MG tablet      TAKE ONE TABLET BY MOUTH AT BEDTIME.   90 tablet   0   . clopidogrel (PLAVIX) 75 MG tablet      TAKE ONE TABLET BY MOUTH EVERY DAY WITH  BREAKFAST   30 tablet   2   . furosemide (LASIX) 40 MG tablet   Oral   Take 1 tablet (40 mg total) by mouth 2 (two) times daily.   60 tablet   3   . hydrALAZINE (APRESOLINE) 25 MG tablet   Oral   Take 25 mg by mouth.          . isosorbide mononitrate (IMDUR) 30 MG 24 hr tablet      TAKE ONE TABLET BY MOUTH ONCE DAILY.   30 tablet   1   . KLOR-CON M20 20 MEQ tablet      TAKE ONE TABLET BY MOUTH TWICE DAILY   60 tablet   1   . losartan (COZAAR) 100 MG tablet      TAKE ONE TABLET BY MOUTH DAILY   30 tablet   3   . Meclizine HCl (BONINE) 25 MG CHEW   Oral   Chew 1 tablet by mouth 2  (two) times daily.           . montelukast (SINGULAIR) 10 MG tablet   Oral   Take 10 mg by mouth at bedtime.         . moxifloxacin (AVELOX) 400 MG tablet   Oral   Take 1 tablet (400 mg total) by mouth daily.   5 tablet   0   . Multiple Vitamins-Minerals (MULTIVITAMIN & MINERAL PO)   Oral   Take 1 tablet by mouth daily.           . nebivolol (BYSTOLIC) 10 MG tablet   Oral   Take 1 tablet (10 mg total) by mouth daily.   30 tablet   8   . nitroGLYCERIN (NITROSTAT) 0.4 MG SL tablet   Sublingual   Place 1 tablet (0.4 mg total) under the tongue every 5 (five) minutes as needed. Call 9-1-1 after taking your third.   25 tablet   3    BP 151/70  Pulse 90  Temp(Src) 98.6 F (37 C) (Oral)  Resp 24  SpO2 95%  Vital signs normal   Physical Exam  Nursing note and vitals reviewed. Constitutional: He is oriented to person, place, and time. He appears well-developed and well-nourished.  Non-toxic appearance. He does not appear ill. He appears distressed.  HENT:  Head: Normocephalic and atraumatic.  Right Ear: External ear normal.  Left Ear: External ear normal.  Nose: Nose normal. No mucosal edema or rhinorrhea.  Mouth/Throat: Oropharynx is clear and moist and mucous membranes are normal. No dental abscesses or uvula swelling.  Eyes: Conjunctivae and EOM are normal. Pupils are equal, round, and reactive to light.  Neck: Normal range of motion and full passive range of motion without pain. Neck supple.  Cardiovascular: Normal rate, regular rhythm and normal heart sounds.  Exam reveals no gallop and no friction rub.   No murmur heard. Pulmonary/Chest: Accessory muscle usage present. Tachypnea noted. He is in respiratory distress. He has decreased breath sounds. He has wheezes. He has no  rhonchi. He has no rales. He exhibits no tenderness and no crepitus.  Sometimes audible wheezing, sounds tight  Abdominal: Soft. Normal appearance and bowel sounds are normal. He exhibits no  distension. There is no tenderness. There is no rebound and no guarding.  Musculoskeletal: Normal range of motion. He exhibits no edema and no tenderness.  Moves all extremities well.   Neurological: He is alert and oriented to person, place, and time. He has normal strength. No cranial nerve deficit.  Skin: Skin is warm, dry and intact. No rash noted. No erythema. No pallor.  Psychiatric: He has a normal mood and affect. His speech is normal and behavior is normal. His mood appears not anxious.    ED Course  Procedures (including critical care time)  Medications  cefTRIAXone (ROCEPHIN) 1 g in dextrose 5 % 50 mL IVPB (1 g Intravenous New Bag/Given 07/02/13 1406)  azithromycin (ZITHROMAX) 500 mg in dextrose 5 % 250 mL IVPB (not administered)  albuterol (PROVENTIL) (5 MG/ML) 0.5% nebulizer solution 5 mg (not administered)  ipratropium (ATROVENT) nebulizer solution 0.5 mg (not administered)  methylPREDNISolone sodium succinate (SOLU-MEDROL) 125 mg/2 mL injection 125 mg (125 mg Intravenous Given 07/02/13 1158)  albuterol (PROVENTIL,VENTOLIN) solution continuous neb (15 mg/hr Nebulization Given 07/02/13 1143)  ipratropium (ATROVENT) nebulizer solution 0.5 mg (0.5 mg Nebulization Given 07/02/13 1143)  furosemide (LASIX) injection 60 mg (60 mg Intravenous Given 07/02/13 1158)  nitroGLYCERIN (NITROGLYN) 2 % ointment 1 inch (1 inch Topical Given 07/02/13 1158)   Recheck after continuous nebulizer at 13:30, pt still has some audible wheezing, although his air movement has improved he still has diffuse wheezing and rhonchi. We discussed need for admission. Pt was last admitted in June, he was given CAP antibiotics for presumed bronchitis. He was also given IV lasix for possible CHF when first seen, however his BNP and his CXR do not bear that diagnosis out.   14:04 Dr Cena Benton, admit to tele, team 8  Labs Review Results for orders placed during the hospital encounter of 07/02/13  CBC WITH DIFFERENTIAL       Result Value Range   WBC 9.8  4.0 - 10.5 K/uL   RBC 4.60  4.22 - 5.81 MIL/uL   Hemoglobin 14.3  13.0 - 17.0 g/dL   HCT 16.1  09.6 - 04.5 %   MCV 95.7  78.0 - 100.0 fL   MCH 31.1  26.0 - 34.0 pg   MCHC 32.5  30.0 - 36.0 g/dL   RDW 40.9  81.1 - 91.4 %   Platelets 191  150 - 400 K/uL   Neutrophils Relative % 80 (*) 43 - 77 %   Neutro Abs 7.9 (*) 1.7 - 7.7 K/uL   Lymphocytes Relative 13  12 - 46 %   Lymphs Abs 1.3  0.7 - 4.0 K/uL   Monocytes Relative 3  3 - 12 %   Monocytes Absolute 0.3  0.1 - 1.0 K/uL   Eosinophils Relative 3  0 - 5 %   Eosinophils Absolute 0.3  0.0 - 0.7 K/uL   Basophils Relative 0  0 - 1 %   Basophils Absolute 0.0  0.0 - 0.1 K/uL  COMPREHENSIVE METABOLIC PANEL      Result Value Range   Sodium 138  135 - 145 mEq/L   Potassium 4.0  3.5 - 5.1 mEq/L   Chloride 95 (*) 96 - 112 mEq/L   CO2 35 (*) 19 - 32 mEq/L   Glucose, Bld 187 (*) 70 - 99  mg/dL   BUN 23  6 - 23 mg/dL   Creatinine, Ser 1.61 (*) 0.50 - 1.35 mg/dL   Calcium 9.2  8.4 - 09.6 mg/dL   Total Protein 7.1  6.0 - 8.3 g/dL   Albumin 3.6  3.5 - 5.2 g/dL   AST 22  0 - 37 U/L   ALT 18  0 - 53 U/L   Alkaline Phosphatase 87  39 - 117 U/L   Total Bilirubin 0.5  0.3 - 1.2 mg/dL   GFR calc non Af Amer 43 (*) >90 mL/min   GFR calc Af Amer 50 (*) >90 mL/min  TROPONIN I      Result Value Range   Troponin I <0.30  <0.30 ng/mL  PRO B NATRIURETIC PEPTIDE      Result Value Range   Pro B Natriuretic peptide (BNP) 185.6  0 - 450 pg/mL   Laboratory interpretation all normal except hyperglycemia, renal insuffic   Imaging Review Dg Chest Portable 1 View  07/02/2013   CLINICAL DATA:  Shortness of breath.  Cough and congestion.  EXAM: PORTABLE CHEST - 1 VIEW  COMPARISON:  Two-view chest 12/19/2012.  FINDINGS: The heart size is normal. Mild interstitial coarsening is chronic. No focal airspace disease is evident. The patient is status post median sternotomy for CABG. The visualized soft tissues and bony thorax are  otherwise unremarkable.  IMPRESSION: 1. No acute cardiopulmonary disease or significant interval change. 2. Stable cardiomegaly without failure.   Electronically Signed   By: Gennette Pac M.D.   On: 07/02/2013 12:06    EKG Interpretation    Date/Time:  Monday July 02 2013 11:10:16 EST Ventricular Rate:  102 PR Interval:  217 QRS Duration: 103 QT Interval:  361 QTC Calculation: 470 R Axis:   -5 Text Interpretation:  Sinus tachycardia Prolonged PR interval Consider anterior infarct Baseline wander in lead(s) V2 V3 Since last tracing rate faster Confirmed by Arthor Gorter  MD-I, Gracielynn Birkel ([id number]) on 07/02/2013 11:21:06 AM            MDM   1. COPD with exacerbation   2. Bronchitis     Plan admission   Devoria Albe, MD, FACEP   CRITICAL CARE Performed by: Devoria Albe L Total critical care time: 31 min Critical care time was exclusive of separately billable procedures and treating other patients. Critical care was necessary to treat or prevent imminent or life-threatening deterioration. Critical care was time spent personally by me on the following activities: development of treatment plan with patient and/or surrogate as well as nursing, discussions with consultants, evaluation of patient's response to treatment, examination of patient, obtaining history from patient or surrogate, ordering and performing treatments and interventions, ordering and review of laboratory studies, ordering and review of radiographic studies, pulse oximetry and re-evaluation of patient's condition.    Ward Givens, MD 07/02/13 [id number]

## 2013-07-02 NOTE — H&P (Signed)
[facility] History and Physical  [name] WUJ:[id number] DOB: [birth year] DOA: 07/02/2013  Referring physician: Dr. Lynelle Doctor PCP:  Duane Lope, MD   Chief Complaint: Increased SOB and cough  HPI: [name] is a 79 y.o. male  With history of diabetes, hypertension, coronary artery disease status post coronary artery bypass grafting and COPD on home oxygen. Who presented to the emergency department complaining of worsening shortness of breath, increase in cough frequency, and productive sputum with coughing. States this has gotten worse within the last 2-3 days. Has been struggling with this problem for the last 3 months and has seen various physicians that have placed him on different antibiotics and steroids per patient.  He states he was having some fevers and chills last night. The problem since onset has been persistent and gradually getting worse. He denies any hemoptysis.   In the ED the patient had a portable chest x-ray which did not report any consolidations or infiltrations. WBC was within normal limits at 9.8 and patient was afebrile. His heart rate was elevated at 107 and respiratory rate was elevated at 22 reportedly patient had a prolonged albuterol treatments while in the ED. Given his persistent symptoms we will consult at for admission evaluation and recommendations.   Review of Systems:  Constitutional:  No weight loss, night sweats, Fevers, chills, fatigue.  HEENT:  No headaches, Difficulty swallowing,Tooth/dental problems,Sore throat,  No sneezing, itching, ear ache, nasal congestion, post nasal drip,  Cardio-vascular:  No chest pain, Orthopnea, PND, swelling in lower extremities, anasarca, dizziness, palpitations  GI:  No heartburn, indigestion, abdominal pain, nausea, vomiting, diarrhea, change in bowel habits, loss of appetite  Resp:  No shortness of breath with exertion or at rest. No excess mucus, no productive cough, No non-productive cough, No  coughing up of blood.No change in color of mucus.No wheezing.No chest wall deformity  Skin:  no rash or lesions.  GU:  no dysuria, change in color of urine, no urgency or frequency. No flank pain.  Musculoskeletal:  No joint pain or swelling. No decreased range of motion. No back pain.  Psych:  No change in mood or affect. No depression or anxiety. No memory loss.   Past Medical History  Diagnosis Date  . CAD (coronary artery disease)     a. s/p CABG x 3 in 1995;  b.Lexi MV 10/2011: NL EF, No isch/infarct;;  d.  NSTEMI 12/2011 -> Cath 6/21: LM 20-30, LAD 90, RI 95, LCX 31m, RCA 90/117m (treated w/ 2.0x16 Promus DES), VG->RCA ok, VG->RI 70-21m (treated w/ 4.0x28 Promus), LIMA->LAD ok, EF 55-60%.  Marland Kitchen HTN (hypertension)   . HLD (hyperlipidemia)   . Obesity   . Syncope   . COPD (chronic obstructive pulmonary disease)   . Hypoxia   . History of pneumonia     "once"  . HOH (hard of hearing)     left ear  . Asthma   . Sleep apnea   . Arthritis     "all over"  . Dyspnea   . Orthopnea     a. Has been sleeping in a recliner x 30 yrs  . Chronic diastolic CHF (congestive heart failure)     a. Echo 10/2011: EF 55-60%;  b. elevated EDP req diuresis.   Past Surgical History  Procedure Laterality Date  . Functional endoscopic sinus surgery  2006  . Hydrocele excision  2005  . Prostate surgery  1998  . Inguinal hernia repair  2006    right  .  Coronary artery bypass graft  1995    CABG X3  . Excisional hemorrhoidectomy      "twice cut them out; burnt them out once"   Social History:  reports that he has never smoked. He has never used smokeless tobacco. He reports that he drinks alcohol. He reports that he does not use illicit drugs.  No Known Allergies  History reviewed. No pertinent family history.   Prior to Admission medications   Medication Sig Start Date End Date Taking? Authorizing Provider  albuterol (PROVENTIL) (2.5 MG/3ML) 0.083% nebulizer solution Take 2.5 mg by  nebulization every 6 (six) hours as needed for wheezing.   Yes Historical Provider, MD  ARTIFICIAL TEAR OP Place 2 drops into both eyes daily as needed. For dry eyes   Yes Historical Provider, MD  aspirin 325 MG tablet Take 325 mg by mouth daily.     Yes Historical Provider, MD  atorvastatin (LIPITOR) 80 MG tablet TAKE ONE TABLET BY MOUTH AT BEDTIME. 05/03/13  Yes Lars Masson, MD  clopidogrel (PLAVIX) 75 MG tablet TAKE ONE TABLET BY MOUTH EVERY DAY WITH  BREAKFAST 05/05/12  Yes Gaylord Shih, MD  furosemide (LASIX) 40 MG tablet Take 1 tablet (40 mg total) by mouth 2 (two) times daily. 03/02/13 03/02/14 Yes Storm Frisk, MD  hydrALAZINE (APRESOLINE) 25 MG tablet Take 25 mg by mouth.  11/14/12  Yes Storm Frisk, MD  isosorbide mononitrate (IMDUR) 30 MG 24 hr tablet TAKE ONE TABLET BY MOUTH ONCE DAILY. 05/03/13  Yes Lars Masson, MD  KLOR-CON M20 20 MEQ tablet TAKE ONE TABLET BY MOUTH TWICE DAILY 05/03/13  Yes Lars Masson, MD  losartan (COZAAR) 100 MG tablet TAKE ONE TABLET BY MOUTH DAILY 11/14/12  Yes Rosalio Macadamia, NP  Meclizine HCl (BONINE) 25 MG CHEW Chew 1 tablet by mouth 2 (two) times daily.     Yes Historical Provider, MD  montelukast (SINGULAIR) 10 MG tablet Take 10 mg by mouth at bedtime.   Yes Historical Provider, MD  moxifloxacin (AVELOX) 400 MG tablet Take 1 tablet (400 mg total) by mouth daily. 05/15/13  Yes Storm Frisk, MD  Multiple Vitamins-Minerals (MULTIVITAMIN & MINERAL PO) Take 1 tablet by mouth daily.     Yes Historical Provider, MD  nebivolol (BYSTOLIC) 10 MG tablet Take 1 tablet (10 mg total) by mouth daily. 12/19/12  Yes Gaylord Shih, MD  nitroGLYCERIN (NITROSTAT) 0.4 MG SL tablet Place 1 tablet (0.4 mg total) under the tongue every 5 (five) minutes as needed. Call 9-1-1 after taking your third. 01/11/12   Gery Pray, PA-C   Physical Exam: Filed Vitals:   07/02/13 1400  BP: 123/59  Pulse: 107  Temp:   Resp:     BP 123/59  Pulse 107   Temp(Src) 98.6 F (37 C) (Oral)  Resp 22  SpO2 91%  General: Alert, awake, oriented x3, in no acute distress. Head: Atraumatic normocephalic Eyes: Extraocular movements intact, nonicteric Ears: Normal exterior appearance, no masses on visual examination Nose: No rhinorrhea, normal exterior appearance Neck: No masses, no goiter Heart: Regular rate and rhythm, without murmurs, rubs, gallops. Lungs: Patient had wheezes bilaterally, with rails bilaterally worse at bases. Nasal cannula in place and patient was speaking in full sentences Abdomen: Soft, nontender, nondistended, positive bowel sounds, obese Extremities: No clubbing cyanosis or edema with positive pedal pulses. Neuro: Grossly intact, nonfocal.           Labs on Admission:  Basic Metabolic  Panel:  Recent Labs Lab 07/02/13 1142  NA 138  K 4.0  CL 95*  CO2 35*  GLUCOSE 187*  BUN 23  CREATININE 1.48*  CALCIUM 9.2   Liver Function Tests:  Recent Labs Lab 07/02/13 1142  AST 22  ALT 18  ALKPHOS 87  BILITOT 0.5  PROT 7.1  ALBUMIN 3.6   No results found for this basename: LIPASE, AMYLASE,  in the last 168 hours No results found for this basename: AMMONIA,  in the last 168 hours CBC:  Recent Labs Lab 07/02/13 1142  WBC 9.8  NEUTROABS 7.9*  HGB 14.3  HCT 44.0  MCV 95.7  PLT 191   Cardiac Enzymes:  Recent Labs Lab 07/02/13 1142  TROPONINI <0.30    BNP (last 3 results)  Recent Labs  07/02/13 1140  PROBNP 185.6   CBG: No results found for this basename: GLUCAP,  in the last 168 hours  Radiological Exams on Admission: Dg Chest Portable 1 View  07/02/2013   CLINICAL DATA:  Shortness of breath.  Cough and congestion.  EXAM: PORTABLE CHEST - 1 VIEW  COMPARISON:  Two-view chest 12/19/2012.  FINDINGS: The heart size is normal. Mild interstitial coarsening is chronic. No focal airspace disease is evident. The patient is status post median sternotomy for CABG. The visualized soft tissues and bony  thorax are otherwise unremarkable.  IMPRESSION: 1. No acute cardiopulmonary disease or significant interval change. 2. Stable cardiomegaly without failure.   Electronically Signed   By: Gennette Pac M.D.   On: 07/02/2013 12:06    EKG: Independently reviewed. Sinus tachycardia with no ST elevations or depressions  Assessment/Plan Principal Problem:   Acute-on-chronic respiratory failure - We'll plan on continuing supplemental oxygen. - Titrate oxygen to keep saturations between 88 and 96%    COPD with exacerbation - Supplemental oxygen as listed above -Solu Medrol, bronchodilators, inhaled steroids - Sputum culture, strep/Legionella urinary antigen - Telemetry monitoring  Active Problems:   CAD, NATIVE VESSEL/CAD, ARTERY BYPASS GRAFT - Stable continue home regimen      Type 2 diabetes mellitus - Sliding scale insulin -Diabetic diet -Routine CBG monitoring    Chronic diastolic CHF (congestive heart failure) - Currently compensated, will continue home regimen    HTN (hypertension) - Stable currently continue home regimen    HLD (hyperlipidemia) - Stable continue statin    Code Status: Full Family Communication: Discussed with patient and family at bedside Disposition Plan: Pending improvement in respiratory rate  Time spent: > 60 minutes Critical care time given # 1  Penny Pia [facility] Pager [phone]

## 2013-07-02 NOTE — ED Notes (Addendum)
Per EMS, Pt, from home, c/o SOB x 1 day.  Sts "this has gone on for about a month, but it got worse yesterday.  I couldn't take it anymore at home."  Hx of asthma and COPD.  125mg  Solumedrol, 0.5 mcg Atrovent and 5mg  Albuterol given en route.

## 2013-07-02 NOTE — ED Notes (Signed)
Bed: ZO10 Expected date: 07/02/13 Expected time: 10:41 AM Means of arrival:  Comments: ems male asthma

## 2013-07-03 ENCOUNTER — Inpatient Hospital Stay (HOSPITAL_COMMUNITY): Payer: Medicare Other

## 2013-07-03 DIAGNOSIS — N179 Acute kidney failure, unspecified: Secondary | ICD-10-CM

## 2013-07-03 DIAGNOSIS — E875 Hyperkalemia: Secondary | ICD-10-CM | POA: Diagnosis present

## 2013-07-03 LAB — BASIC METABOLIC PANEL
BUN: 35 mg/dL — ABNORMAL HIGH (ref 6–23)
Calcium: 9.5 mg/dL (ref 8.4–10.5)
Chloride: 94 mEq/L — ABNORMAL LOW (ref 96–112)
Creatinine, Ser: 2.03 mg/dL — ABNORMAL HIGH (ref 0.50–1.35)
GFR calc Af Amer: 34 mL/min — ABNORMAL LOW (ref >90)
GFR calc non Af Amer: 29 mL/min — ABNORMAL LOW (ref >90)
Glucose, Bld: 143 mg/dL — ABNORMAL HIGH (ref 70–99)

## 2013-07-03 LAB — GLUCOSE, CAPILLARY
Glucose-Capillary: 254 mg/dL — ABNORMAL HIGH (ref 70–99)
Glucose-Capillary: 129 mg/dL — ABNORMAL HIGH (ref 70–99)
Glucose-Capillary: 125 mg/dL — ABNORMAL HIGH (ref 70–99)
Glucose-Capillary: 135 mg/dL — ABNORMAL HIGH (ref 70–99)

## 2013-07-03 LAB — CBC
HCT: 44.1 % (ref 39.0–52.0)
MCHC: 31.7 g/dL (ref 30.0–36.0)
RDW: 14.8 % (ref 11.5–15.5)

## 2013-07-03 LAB — POTASSIUM: Potassium: 5 mEq/L (ref 3.5–5.1)

## 2013-07-03 MED ORDER — SODIUM POLYSTYRENE SULFONATE 15 GM/60ML PO SUSP
15.0000 g | Freq: Once | ORAL | Status: AC
  Administered 2013-07-03: 20:00:00 15 g via ORAL
  Filled 2013-07-03: qty 60

## 2013-07-03 MED ORDER — ALBUTEROL SULFATE (5 MG/ML) 0.5% IN NEBU: 2.5000 mg | INHALATION_SOLUTION | RESPIRATORY_TRACT | Status: DC | PRN

## 2013-07-03 MED ORDER — ALBUTEROL SULFATE (5 MG/ML) 0.5% IN NEBU
2.5000 mg | INHALATION_SOLUTION | Freq: Four times a day (QID) | RESPIRATORY_TRACT | Status: DC
  Administered 2013-07-03 (×2): 2.5 mg via RESPIRATORY_TRACT
  Filled 2013-07-03 (×2): qty 0.5

## 2013-07-03 MED ORDER — ALBUTEROL SULFATE (5 MG/ML) 0.5% IN NEBU
2.5000 mg | INHALATION_SOLUTION | RESPIRATORY_TRACT | Status: DC
  Administered 2013-07-03 – 2013-07-05 (×10): 2.5 mg via RESPIRATORY_TRACT
  Filled 2013-07-03 (×10): qty 0.5

## 2013-07-03 MED ORDER — METHYLPREDNISOLONE SODIUM SUCC 125 MG IJ SOLR
60.0000 mg | Freq: Four times a day (QID) | INTRAMUSCULAR | Status: DC
  Filled 2013-07-03 (×3): qty 0.96

## 2013-07-03 MED ORDER — METHYLPREDNISOLONE SODIUM SUCC 125 MG IJ SOLR
60.0000 mg | Freq: Four times a day (QID) | INTRAMUSCULAR | Status: DC
  Administered 2013-07-03 – 2013-07-04 (×3): 60 mg via INTRAVENOUS
  Filled 2013-07-03 (×7): qty 0.96

## 2013-07-03 NOTE — Progress Notes (Signed)
[facility] PROGRESS NOTE  Fred GUTHRIDGE Sanchez:[id number] DOB: [birth year] DOA: 07/02/2013 PCP:  Duane Lope, MD  Assessment/Plan: Assessment/Plan  Principal Problem:  Acute-on-chronic respiratory failure  - Continue supplemental oxygen - Titrate oxygen to keep saturations between 88 and 96%  - Etiology most likely related to COPD exacerbation, but etiology uncertain and as such will obtain chest x-ray 2 view for better characterization. BNP was mildly elevated at 185.6  COPD with exacerbation  - Supplemental oxygen as listed above  - bronchodilators, inhaled steroids, place order for albuterol every 2 hours as needed and every 4 hours scheduled - Given minimal improvement in condition will increase Solu-Medrol to 60 mg 4 times daily - Sputum culture pending - strep urinary antigen negative, legionella urinary antigen pending - Telemetry monitoring   Acute renal failure - Could be secondary to prerenal causes - But will obtain urinary sodium and urinary creatinine to assess FeNa - Obtain renal ultrasound - Reassess serum creatinine next a.m.  Hyperkalemia - Will repeat potassium level - Administer Kayexalate - EKG and now and next a.m.  Active Problems:  CAD, NATIVE VESSEL/CAD, ARTERY BYPASS GRAFT  - Stable continue home regimen   Type 2 diabetes mellitus  - Sliding scale insulin  -Diabetic diet  -Routine CBG monitoring   Chronic diastolic CHF (congestive heart failure)  - Currently compensated, will continue home regimen  - Given elevation in serum creatinine will hold Lasix, but if chest x-ray suspicious for pulmonary edema with plan on changing to oral Lasix regimen to Lasix IV  HTN (hypertension)  - Stable currently continue home regimen   HLD (hyperlipidemia)  - Stable continue statin   Code Status: Full code Family Communication: No family at bedside. Discussed with patient and family yesterday Disposition Plan: Pending  improve   Consultants:  None  Procedures:  None  Antibiotics:  Patient is on azithromycin and Rocephin  HPI/Subjective: This called by the nurse because this evening patient had difficulty breathing patient states his breathing is about the same. Patient states he has had trouble sleeping flat but for years and sleeps on a recliner. States he has had improvement with albuterol treatments  Objective: Filed Vitals:   07/03/13 1350  BP: 121/46  Pulse: 78  Temp: 97.2 F (36.2 C)  Resp: 20    Intake/Output Summary (Last 24 hours) at 07/03/13 1853 Last data filed at 07/03/13 1700  Gross per 24 hour  Intake   1240 ml  Output   1425 ml  Net   -185 ml   Filed Weights   07/02/13 1528 07/03/13 1145  Weight: 134.4 kg (296 lb 4.8 oz) 133.63 kg (294 lb 9.6 oz)    Exam:   General:  Pt in NAD, Alert and Awake  Cardiovascular: RRR, no mrg  Respiratory: Coarse breath sounds diffusely, mild wheezes with expiration, prolonged expiratory phase  Abdomen: Soft, obese, nontender  Musculoskeletal: No cyanosis or clubbing  Data Reviewed: Basic Metabolic Panel:  Recent Labs Lab 07/02/13 1142 07/03/13 0435  NA 138 137  K 4.0 5.2*  CL 95* 94*  CO2 35* 33*  GLUCOSE 187* 143*  BUN 23 35*  CREATININE 1.48* 2.03*  CALCIUM 9.2 9.5  MG 2.2  --   PHOS 2.3  --    Liver Function Tests:  Recent Labs Lab 07/02/13 1142  AST 22  ALT 18  ALKPHOS 87  BILITOT 0.5  PROT 7.1  ALBUMIN 3.6   No results found for this basename: LIPASE, AMYLASE,  in the  last 168 hours No results found for this basename: AMMONIA,  in the last 168 hours CBC:  Recent Labs Lab 07/02/13 1142 07/03/13 0435  WBC 9.8 16.1*  NEUTROABS 7.9*  --   HGB 14.3 14.0  HCT 44.0 44.1  MCV 95.7 96.5  PLT 191 221   Cardiac Enzymes:  Recent Labs Lab 07/02/13 1142  TROPONINI <0.30   BNP (last 3 results)  Recent Labs  07/02/13 1140  PROBNP 185.6   CBG:  Recent Labs Lab 07/02/13 1656  07/02/13 2039 07/03/13 0745 07/03/13 1136 07/03/13 1738  GLUCAP 219* 254* 118* 129* 125*    No results found for this or any previous visit (from the past 240 hour(s)).   Studies: Dg Chest Portable 1 View  07/02/2013   CLINICAL DATA:  Shortness of breath.  Cough and congestion.  EXAM: PORTABLE CHEST - 1 VIEW  COMPARISON:  Two-view chest 12/19/2012.  FINDINGS: The heart size is normal. Mild interstitial coarsening is chronic. No focal airspace disease is evident. The patient is status post median sternotomy for CABG. The visualized soft tissues and bony thorax are otherwise unremarkable.  IMPRESSION: 1. No acute cardiopulmonary disease or significant interval change. 2. Stable cardiomegaly without failure.   Electronically Signed   By: Gennette Pac M.D.   On: 07/02/2013 12:06    Scheduled Meds: . albuterol  2.5 mg Nebulization QID  . antiseptic oral rinse  15 mL Mouth Rinse BID  . aspirin  325 mg Oral Daily  . atorvastatin  80 mg Oral q1800  . azithromycin  500 mg Intravenous Q24H  . cefTRIAXone (ROCEPHIN)  IV  1 g Intravenous Q24H  . clopidogrel  75 mg Oral Q breakfast  . fluticasone  2 puff Inhalation BID  . heparin  5,000 Units Subcutaneous Q8H  . hydrALAZINE  25 mg Oral Q8H  . insulin aspart  0-15 Units Subcutaneous TID WC  . insulin aspart  0-5 Units Subcutaneous QHS  . isosorbide mononitrate  30 mg Oral Daily  . losartan  100 mg Oral Daily  . meclizine  25 mg Oral BID  . methylPREDNISolone (SOLU-MEDROL) injection  60 mg Intravenous Q6H  . montelukast  10 mg Oral QHS  . nebivolol  10 mg Oral Daily  . sodium chloride  3 mL Intravenous Q12H  . sodium polystyrene  15 g Oral Once  . tiotropium  18 mcg Inhalation Daily   Continuous Infusions:   Principal Problem:   Acute-on-chronic respiratory failure Active Problems:   COPD with exacerbation   CAD, NATIVE VESSEL   CAD, ARTERY BYPASS GRAFT   Type 2 diabetes mellitus   Chronic diastolic CHF (congestive heart failure)    HTN (hypertension)   HLD (hyperlipidemia) Hyperkalemia   Time spent: > 50 minutes  Penny Pia  [facility] Pager [phone]. If 7PM-7AM, please contact night-coverage at [email or web address], password [facility] 07/03/2013, 6:53 PM  LOS: 1 day

## 2013-07-04 LAB — BASIC METABOLIC PANEL
Chloride: 97 mEq/L (ref 96–112)
Creatinine, Ser: 1.66 mg/dL — ABNORMAL HIGH (ref 0.50–1.35)
GFR calc Af Amer: 43 mL/min — ABNORMAL LOW (ref >90)

## 2013-07-04 LAB — LEGIONELLA ANTIGEN, URINE: Legionella Antigen, Urine: NEGATIVE

## 2013-07-04 LAB — GLUCOSE, CAPILLARY: Glucose-Capillary: 141 mg/dL — ABNORMAL HIGH (ref 70–99)

## 2013-07-04 LAB — CBC
HCT: 44.9 % (ref 39.0–52.0)
Platelets: 211 10*3/uL (ref 150–400)
RBC: 4.65 MIL/uL (ref 4.22–5.81)
RDW: 14.9 % (ref 11.5–15.5)
WBC: 15 10*3/uL — ABNORMAL HIGH (ref 4.0–10.5)

## 2013-07-04 MED ORDER — CEFPODOXIME PROXETIL 200 MG PO TABS
200.0000 mg | ORAL_TABLET | Freq: Every day | ORAL | Status: DC
  Filled 2013-07-04: qty 1

## 2013-07-04 MED ORDER — AZITHROMYCIN 500 MG PO TABS
500.0000 mg | ORAL_TABLET | Freq: Every day | ORAL | Status: DC
  Administered 2013-07-04 – 2013-07-05 (×2): 500 mg via ORAL
  Filled 2013-07-04 (×2): qty 1

## 2013-07-04 MED ORDER — CEFPODOXIME PROXETIL 200 MG PO TABS
200.0000 mg | ORAL_TABLET | Freq: Two times a day (BID) | ORAL | Status: DC
  Administered 2013-07-04 – 2013-07-05 (×3): 200 mg via ORAL
  Filled 2013-07-04 (×5): qty 1

## 2013-07-04 MED ORDER — METHYLPREDNISOLONE SODIUM SUCC 125 MG IJ SOLR
60.0000 mg | Freq: Three times a day (TID) | INTRAMUSCULAR | Status: DC
  Administered 2013-07-04 – 2013-07-05 (×3): 60 mg via INTRAVENOUS
  Filled 2013-07-04 (×6): qty 0.96

## 2013-07-04 NOTE — Progress Notes (Signed)
[facility] PROGRESS NOTE  [name]:[id number] DOB: [birth year] DOA: 07/02/2013  PCP:  Duane Lope, MD  Brief HPI: [name] is a 79 y.o. male with history of diabetes, hypertension, coronary artery disease status post coronary artery bypass grafting and COPD on home oxygen. He presented to the emergency department complaining of worsening shortness of breath, increase in cough frequency, and productive sputum with coughing, worse within the last 2-3 days. He had been struggling with this problem for the last 3 months and has seen various physicians that have placed him on different antibiotics and steroids per patient.   Past medical history:  Past Medical History  Diagnosis Date  . CAD (coronary artery disease)     a. s/p CABG x 3 in 1995;  b.Lexi MV 10/2011: NL EF, No isch/infarct;;  d.  NSTEMI 12/2011 -> Cath 6/21: LM 20-30, LAD 90, RI 95, LCX 73m, RCA 90/180m (treated w/ 2.0x16 Promus DES), VG->RCA ok, VG->RI 70-19m (treated w/ 4.0x28 Promus), LIMA->LAD ok, EF 55-60%.  Marland Kitchen HTN (hypertension)   . HLD (hyperlipidemia)   . Obesity   . Syncope   . COPD (chronic obstructive pulmonary disease)   . Hypoxia   . History of pneumonia     "once"  . HOH (hard of hearing)     left ear  . Asthma   . Sleep apnea   . Arthritis     "all over"  . Dyspnea   . Orthopnea     a. Has been sleeping in a recliner x 30 yrs  . Chronic diastolic CHF (congestive heart failure)     a. Echo 10/2011: EF 55-60%;  b. elevated EDP req diuresis.    Consultants: None  Procedures: None  Antibiotics: Ceftriaxone and Zith 12/15-->  Subjective: Patient feels some better. Still coughing but better and dry. No chest pain currently. Has chronic chest pain from CAD. Uses O2 at home.  Objective: Vital Signs  Filed Vitals:   07/04/13 0426 07/04/13 0451 07/04/13 0620 07/04/13 0828  BP:  116/65    Pulse:  76    Temp:  97.4 F (36.3 C)    TempSrc:  Oral    Resp:  20    Height:       Weight:   133.8 kg (294 lb 15.6 oz)   SpO2: 95% 95%  95%    Intake/Output Summary (Last 24 hours) at 07/04/13 0902 Last data filed at 07/04/13 0805  Gross per 24 hour  Intake   1330 ml  Output   2400 ml  Net  -1070 ml   Filed Weights   07/02/13 1528 07/03/13 1145 07/04/13 0620  Weight: 134.4 kg (296 lb 4.8 oz) 133.63 kg (294 lb 9.6 oz) 133.8 kg (294 lb 15.6 oz)    General appearance: alert, cooperative, appears stated age, no distress and moderately obese Head: Normocephalic, without obvious abnormality, atraumatic Resp: few scattered wheezes bilateral. No crackles. Cardio: regular rate and rhythm, S1, S2 normal, no murmur, click, rub or gallop GI: soft, non-tender; bowel sounds normal; no masses,  no organomegaly Extremities: extremities normal, atraumatic, no cyanosis or edema Neurologic: No focal deficits  Lab Results:  Basic Metabolic Panel:  Recent Labs Lab 07/02/13 1142 07/03/13 0435 07/03/13 2019 07/04/13 0449  NA 138 137  --  138  K 4.0 5.2* 5.0 4.9  CL 95* 94*  --  97  CO2 35* 33*  --  31  GLUCOSE 187* 143*  --  160*  BUN  23 35*  --  41*  CREATININE 1.48* 2.03*  --  1.66*  CALCIUM 9.2 9.5  --  9.0  MG 2.2  --   --   --   PHOS 2.3  --   --   --    Liver Function Tests:  Recent Labs Lab 07/02/13 1142  AST 22  ALT 18  ALKPHOS 87  BILITOT 0.5  PROT 7.1  ALBUMIN 3.6   CBC:  Recent Labs Lab 07/02/13 1142 07/03/13 0435 07/04/13 0449  WBC 9.8 16.1* 15.0*  NEUTROABS 7.9*  --   --   HGB 14.3 14.0 13.8  HCT 44.0 44.1 44.9  MCV 95.7 96.5 96.6  PLT 191 221 211   Cardiac Enzymes:  Recent Labs Lab 07/02/13 1142  TROPONINI <0.30   BNP (last 3 results)  Recent Labs  07/02/13 1140  PROBNP 185.6   CBG:  Recent Labs Lab 07/02/13 2039 07/03/13 0745 07/03/13 1136 07/03/13 1738 07/03/13 2114  GLUCAP 254* 118* 129* 125* 135*    No results found for this or any previous visit (from the past 240 hour(s)).    Studies/Results: Dg  Chest 2 View  07/03/2013   CLINICAL DATA:  Cough, chest congestion, shortness of breath, hypertension, COPD, coronary artery disease, asthma  EXAM: CHEST  2 VIEW  COMPARISON:  07/02/2013  FINDINGS: Minimal enlargement of cardiac silhouette post CABG.  Tortuous aorta.  Pulmonary vascularity normal.  Lungs clear.  Mild peribronchial thickening.  No pleural effusion or pneumothorax.  Bones unremarkable.  IMPRESSION: Enlargement of cardiac silhouette post CABG.  Mild bronchitic changes.   Electronically Signed   By: Ulyses Southward M.D.   On: 07/03/2013 19:28   US Renal  07/03/2013   CLINICAL DATA:  Worsening renal insufficiency. Diabetes and hypertension.  EXAM: RENAL/URINARY TRACT ULTRASOUND COMPLETE  COMPARISON:  None.  FINDINGS: Right Kidney:  Length: 11.9 cm. Suboptimally visualized due to large patient habitus. Echogenicity within normal limits. No mass or hydronephrosis visualized.  Left Kidney:  Length: 13.3 cm. Suboptimally visualized due to large patient habitus. Echogenicity within normal limits. Several cysts noted, largest measuring 3.5 cm. No definite mass or hydronephrosis visualized.  Bladder:  Appears normal for degree of bladder distention.  IMPRESSION: Technically difficult exam.  No evidence of hydronephrosis.   Electronically Signed   By: Myles Rosenthal M.D.   On: 07/03/2013 21:02   Dg Chest Portable 1 View  07/02/2013   CLINICAL DATA:  Shortness of breath.  Cough and congestion.  EXAM: PORTABLE CHEST - 1 VIEW  COMPARISON:  Two-view chest 12/19/2012.  FINDINGS: The heart size is normal. Mild interstitial coarsening is chronic. No focal airspace disease is evident. The patient is status post median sternotomy for CABG. The visualized soft tissues and bony thorax are otherwise unremarkable.  IMPRESSION: 1. No acute cardiopulmonary disease or significant interval change. 2. Stable cardiomegaly without failure.   Electronically Signed   By: Gennette Pac M.D.   On: 07/02/2013 12:06     Medications:  Scheduled: . albuterol  2.5 mg Nebulization Q4H  . antiseptic oral rinse  15 mL Mouth Rinse BID  . aspirin  325 mg Oral Daily  . atorvastatin  80 mg Oral q1800  . azithromycin  500 mg Intravenous Q24H  . cefTRIAXone (ROCEPHIN)  IV  1 g Intravenous Q24H  . clopidogrel  75 mg Oral Q breakfast  . fluticasone  2 puff Inhalation BID  . heparin  5,000 Units Subcutaneous Q8H  . hydrALAZINE  25  mg Oral Q8H  . insulin aspart  0-15 Units Subcutaneous TID WC  . insulin aspart  0-5 Units Subcutaneous QHS  . isosorbide mononitrate  30 mg Oral Daily  . losartan  100 mg Oral Daily  . meclizine  25 mg Oral BID  . methylPREDNISolone (SOLU-MEDROL) injection  60 mg Intravenous Q6H  . montelukast  10 mg Oral QHS  . nebivolol  10 mg Oral Daily  . sodium chloride  3 mL Intravenous Q12H  . tiotropium  18 mcg Inhalation Daily   Continuous:  ZOX:WRUEAV chloride, albuterol, nitroGLYCERIN, sodium chloride  Assessment/Plan:  Principal Problem:   Acute-on-chronic respiratory failure Active Problems:   CAD, NATIVE VESSEL   CAD, ARTERY BYPASS GRAFT   Type 2 diabetes mellitus   Chronic diastolic CHF (congestive heart failure)   HTN (hypertension)   HLD (hyperlipidemia)   COPD with exacerbation   Hyperkalemia   Acute renal failure    Acute-on-chronic respiratory failure  Improving. Etiology most likely related to COPD exacerbation  COPD with acute exacerbation  Continue current management with antibiotics and steroids. On home o2. Patient denies smoking himself but wife smoked 2-3 ppd for 30+ years.  Acute renal failure  Could be secondary to prerenal causes. Improving. No hydronephrosis noted on [place]. Holding Lasix.  Hyperkalemia  Improved. Probably related to ARF. Noted that he is on ARB. Will monitor for now.   CAD, NATIVE VESSEL/CAD, ARTERY BYPASS GRAFT  Stable with chronic angina. On Imdur. Continue home regimen. Trop was normal.   Type 2 diabetes mellitus  Sliding  scale insulin. Routine CBG monitoring   Chronic diastolic CHF (congestive heart failure)  Currently compensated. Given elevation in serum creatinine will hold Lasix.  CXR doesn't suggest CHF. Consider starting Lasix at lower dose in AM. Not on IVF.  HTN (hypertension)  Stable currently continue home regimen   HLD (hyperlipidemia)  Stable continue statin   Code Status: Full Code  DVT Prophylaxis: Heparin SQ    Family Communication: Discussed with patient  Disposition Plan: PT/OT. Will likely go back home when ready.    LOS: 2 days   [facility]  [facility] Pager [phone] 07/04/2013, 9:02 AM  If 8PM-8AM, please contact night-coverage at [email or web address], password [facility]

## 2013-07-04 NOTE — Progress Notes (Signed)
1600: report received from E. Ali,RN. Will continue current plan of care. [name] 

## 2013-07-04 NOTE — Evaluation (Signed)
Physical Therapy Evaluation Patient Details Name: [name] MRN: [id number] DOB: 1931-07-15 Today's Date: 07/04/2013 Time: 1210-1222 PT Time Calculation (min): 12 min  PT Assessment / Plan / Recommendation History of Present Illness  79 yo male admitted with acute on chronic resp failure. Hx of asthma, syncope, orthopnea, COPD  Clinical Impression  On eval, pt required Min assist for mobility-able to ambulate ~100 feet-unsteady at times. O2 sats dropped to 80% on RA-replaced 3L O2 [place]. Encouraged pt to ambulate later today with nursing supervision and O2. Recommend HHPT at discharge. Pt states he has access to walker, cane.     PT Assessment  Patient needs continued PT services    Follow Up Recommendations  Home health PT    Does the patient have the potential to tolerate intense rehabilitation      Barriers to Discharge        Equipment Recommendations  None recommended by PT    Recommendations for Other Services OT consult   Frequency Min 3X/week    Precautions / Restrictions Precautions Precautions: Fall Precaution Comments: O2 dep Restrictions Weight Bearing Restrictions: No   Pertinent Vitals/Pain 97% on 3L O2 80% on RA      Mobility  Bed Mobility Bed Mobility: Not assessed Details for Bed Mobility Assistance: pt sitting in recliner Transfers Transfers: Stand to Sit Stand to Sit: 6: Modified independent (Device/Increase time) Ambulation/Gait Ambulation/Gait Assistance: 4: Min assist Ambulation Distance (Feet): 100 Feet Assistive device: 1 person hand held assist Ambulation/Gait Assistance Details:  Pt walking out of bathroom with nursing tech-agreeable to ambulate with nursing. close guard for safety. LOB x 1 requiring Min assist to correct. O2 sats dropped to 80% on RA. Replaced 3L O2 [place].  Gait Pattern: Step-through pattern    Exercises     PT Diagnosis: Generalized weakness;Difficulty walking  PT Problem List: Decreased activity  tolerance;Decreased balance;Decreased mobility;Obesity PT Treatment Interventions: Gait training;DME instruction;Functional mobility training;Therapeutic activities;Therapeutic exercise;Patient/family education;Balance training     PT Goals(Current goals can be found in the care plan section) Acute Rehab PT Goals Patient Stated Goal: home tomorrow PT Goal Formulation: With patient Time For Goal Achievement: 07/18/13 Potential to Achieve Goals: Good  Visit Information  Last PT Received On: 07/04/13 Assistance Needed: +1 History of Present Illness: 79 yo male admitted with acute on chronic resp failure. Hx of asthma, syncope, orthopnea, COPD       Prior Functioning  Home Living Family/patient expects to be discharged to:: Private residence Living Arrangements: Children Available Help at Discharge: Family Type of Home: Mobile home Home Access: Stairs to enter Entrance Stairs-Number of Steps: 2 Entrance Stairs-Rails: None Home Layout: One level Home Equipment: Environmental consultant - 2 wheels;Cane - single point Prior Function Level of Independence: Needs assistance Gait / Transfers Assistance Needed: does not use assistive device ADL's / Homemaking Assistance Needed: son takes care of cooking, cleaning Communication Communication: No difficulties    Cognition  Cognition Arousal/Alertness: Awake/alert Behavior During Therapy: WFL for tasks assessed/performed Overall Cognitive Status: Within Functional Limits for tasks assessed    Extremity/Trunk Assessment Upper Extremity Assessment Upper Extremity Assessment: Overall WFL for tasks assessed Lower Extremity Assessment Lower Extremity Assessment: Generalized weakness Cervical / Trunk Assessment Cervical / Trunk Assessment: Normal   Balance    End of Session PT - End of Session Equipment Utilized During Treatment: Oxygen Activity Tolerance: Patient tolerated treatment well Patient left: in chair;with call bell/phone within reach  GP      Rebeca Alert, MPT Pager: [phone]

## 2013-07-05 LAB — BASIC METABOLIC PANEL
BUN: 55 mg/dL — ABNORMAL HIGH (ref 6–23)
Calcium: 8.5 mg/dL (ref 8.4–10.5)
Creatinine, Ser: 1.63 mg/dL — ABNORMAL HIGH (ref 0.50–1.35)
GFR calc Af Amer: 44 mL/min — ABNORMAL LOW (ref >90)
GFR calc non Af Amer: 38 mL/min — ABNORMAL LOW (ref >90)
Glucose, Bld: 149 mg/dL — ABNORMAL HIGH (ref 70–99)
Sodium: 134 mEq/L — ABNORMAL LOW (ref 135–145)

## 2013-07-05 LAB — CBC
HCT: 42.3 % (ref 39.0–52.0)
Hemoglobin: 13.5 g/dL (ref 13.0–17.0)
MCH: 30.4 pg (ref 26.0–34.0)
MCHC: 31.9 g/dL (ref 30.0–36.0)
MCV: 95.3 fL (ref 78.0–100.0)
RDW: 14.8 % (ref 11.5–15.5)

## 2013-07-05 MED ORDER — CEFPODOXIME PROXETIL 200 MG PO TABS: 200.0000 mg | ORAL_TABLET | Freq: Two times a day (BID) | ORAL | Status: DC

## 2013-07-05 MED ORDER — ALBUTEROL SULFATE (2.5 MG/3ML) 0.083% IN NEBU: 2.5000 mg | INHALATION_SOLUTION | Freq: Four times a day (QID) | RESPIRATORY_TRACT | Status: AC | PRN

## 2013-07-05 MED ORDER — TIOTROPIUM BROMIDE MONOHYDRATE 18 MCG IN CAPS: 18.0000 ug | ORAL_CAPSULE | Freq: Every day | RESPIRATORY_TRACT | Status: AC

## 2013-07-05 MED ORDER — PREDNISONE 20 MG PO TABS: ORAL_TABLET | ORAL | Status: DC

## 2013-07-05 MED ORDER — FUROSEMIDE 40 MG PO TABS: 40.0000 mg | ORAL_TABLET | Freq: Every day | ORAL | Status: DC

## 2013-07-05 MED ORDER — AZITHROMYCIN 500 MG PO TABS: 500.0000 mg | ORAL_TABLET | Freq: Every day | ORAL | Status: DC

## 2013-07-05 MED ORDER — POTASSIUM CHLORIDE CRYS ER 20 MEQ PO TBCR: 20.0000 meq | EXTENDED_RELEASE_TABLET | Freq: Every day | ORAL | Status: DC

## 2013-07-05 MED ORDER — FLUTICASONE PROPIONATE HFA 110 MCG/ACT IN AERO: 2.0000 | INHALATION_SPRAY | Freq: Two times a day (BID) | RESPIRATORY_TRACT | Status: DC

## 2013-07-05 NOTE — Evaluation (Signed)
Occupational Therapy Evaluation Patient Details Name: [name] MRN: [id number] DOB: [birth year] Today's Date: 07/05/2013 Time: 4098-1191 OT Time Calculation (min): 26 min  OT Assessment / Plan / Recommendation History of present illness 79 yo male admitted with acute on chronic resp failure. Hx of asthma, syncope, orthopnea, COPD   Clinical Impression   Pt overall at min guard assist level and educated on energy conservation techniques including sitting for activity, PLB and rest breaks. Pt states son/daughter will be at home and can help some. He will benefit from OT while on acute to continue to increase his independence with self care tasks.     OT Assessment  Patient needs continued OT Services    Follow Up Recommendations  No OT follow up;Supervision - Intermittent    Barriers to Discharge      Equipment Recommendations  None recommended by OT    Recommendations for Other Services    Frequency  Min 2X/week    Precautions / Restrictions Precautions Precautions: Fall Precaution Comments: O2 dep Restrictions Weight Bearing Restrictions: No   Pertinent Vitals/Pain 92-93% on 3L during activity 89% on 3L at end of session but up to 95% with rest and PLB.     ADL  Eating/Feeding: Simulated;Independent Where Assessed - Eating/Feeding: Chair Grooming: Simulated;Wash/dry hands;Set up Where Assessed - Grooming: Supported sitting Upper Body Bathing: Simulated;Chest;Right arm;Left arm;Abdomen;Set up Where Assessed - Upper Body Bathing: Unsupported sitting Lower Body Bathing: Simulated;Min guard Where Assessed - Lower Body Bathing: Supported sit to stand Upper Body Dressing: Simulated;Set up Where Assessed - Upper Body Dressing: Unsupported sitting Lower Body Dressing: Simulated;Min guard Where Assessed - Lower Body Dressing: Supported sit to stand Toilet Transfer: Performed;Min guard Acupuncturist: Comfort height toilet;Raised toilet seat with arms (or  3-in-1 over toilet) Toileting - Clothing Manipulation and Hygiene: Simulated;Min guard Where Assessed - Engineer, mining and Hygiene: Sit to stand from 3-in-1 or toilet Tub/Shower Transfer: Simulated;Min guard ADL Comments: Pt had more difficulty with rising from comfort height commode without grab bar as he has high toilets at home but no bar. He was unsteady with rising from toilet but no help needed. Pt then practiced rising from 3in1 and he did very well. He has a 3in1 at home and OT recommended  pt place 3in1 over toilet to help with transitions on and off toilet. His sats with activity were 92-93% on 3L except at the end of session he briefly dipped to 89% but up to 95% with rest and PLB. Discussed wearing O2 with showering and sitting during shower to help with energy conservation. Pt states son can help PRN at home.     OT Diagnosis: Generalized weakness  OT Problem List: Decreased strength;Decreased knowledge of use of DME or AE OT Treatment Interventions: Self-care/ADL training;DME and/or AE instruction;Energy conservation;Patient/family education   OT Goals(Current goals can be found in the care plan section) Acute Rehab OT Goals Patient Stated Goal: home OT Goal Formulation: With patient Time For Goal Achievement: 07/19/13 Potential to Achieve Goals: Good  Visit Information  Last OT Received On: 07/05/13 Assistance Needed: +1 History of Present Illness: 79 yo male admitted with acute on chronic resp failure. Hx of asthma, syncope, orthopnea, COPD       Prior Functioning     Home Living Family/patient expects to be discharged to:: Private residence Living Arrangements: Children Available Help at Discharge: Family Type of Home: Mobile home Home Access: Stairs to enter Entrance Stairs-Number of Steps: 2 Entrance Stairs-Rails: None Home  Layout: One level Home Equipment: Walker - 2 wheels;Cane - single point;Bedside commode Prior Function Level of  Independence: Needs assistance Gait / Transfers Assistance Needed: does not use assistive device ADL's / Homemaking Assistance Needed: son takes care of cooking, cleaning Communication Communication: No difficulties         Vision/Perception     Cognition  Cognition Arousal/Alertness: Awake/alert Behavior During Therapy: WFL for tasks assessed/performed Overall Cognitive Status: Within Functional Limits for tasks assessed    Extremity/Trunk Assessment Upper Extremity Assessment Upper Extremity Assessment: Generalized weakness     Mobility Transfers Transfers: Sit to Stand;Stand to Sit Sit to Stand: 4: Min guard;Without upper extremity assist;From toilet Stand to Sit: 4: Min guard;Without upper extremity assist;To toilet Details for Transfer Assistance: more unsteady without UE assist to rise and descent to toilet, needing min guard assist. With 3in1 armrests pt was safer and supervision.      Exercise     Balance Balance Balance Assessed: Yes Dynamic Standing Balance Dynamic Standing - Level of Assistance: 5: Stand by assistance   End of Session OT - End of Session Equipment Utilized During Treatment: Oxygen Activity Tolerance: Patient tolerated treatment well Patient left: in chair;with call bell/phone within reach  GO     Lennox Laity [phone] 07/05/2013, 9:34 AM

## 2013-07-05 NOTE — Discharge Summary (Signed)
@LOGO @  [facility]  Physician Discharge Summary   Patient ID: [name] MRN: [id number] DOB/AGE: 1931-06-19 79 y.o.  Admit date: 07/02/2013 Discharge date: 07/05/2013  PCP:  Duane Lope, MD  DISCHARGE DIAGNOSES:  Active Problems:   CAD, NATIVE VESSEL   CAD, ARTERY BYPASS GRAFT   Type 2 diabetes mellitus   Chronic diastolic CHF (congestive heart failure)   HTN (hypertension)   HLD (hyperlipidemia)   COPD with exacerbation   RECOMMENDATIONS FOR OUTPATIENT FOLLOW UP: 1. Home health will be arranged. 2. Needs BMET next week which should be done by home health.  DISCHARGE CONDITION: fair  Diet recommendation: Heart Healthy  Filed Weights   07/03/13 1145 07/04/13 0620 07/05/13 0500  Weight: 133.63 kg (294 lb 9.6 oz) 133.8 kg (294 lb 15.6 oz) 134.31 kg (296 lb 1.6 oz)    INITIAL HISTORY: [name] is a 79 y.o. male with history of diabetes, hypertension, coronary artery disease status post coronary artery bypass grafting and COPD on home oxygen. He presented to the emergency department complaining of worsening shortness of breath, increase in cough frequency, and productive sputum with coughing, worse within the last 2-3 days. He had been struggling with this problem for the last 3 months and has seen various physicians that have placed him on different antibiotics and steroids per patient.   Consultations:  None  Procedures:  None  HOSPITAL COURSE:   Acute-on-chronic respiratory failure  Secondary to COPD exacerbation. It has resolved with treatment.   COPD with acute exacerbation  Patient has improved. Will continue treating with oral steroids, antibiotics and nebulizer treatments at home. Continue Spiriva and Flovent as well. He is also on home O2. Patient denies smoking himself but wife smoked 2-3 ppd for 30+ years so he has significant passive exposure.   Acute renal failure  Could be secondary to prerenal causes. This has improved and now  at baseline. BUN is elevated. Patient encouraged to drink fluids at home. Bmet next week. No hydronephrosis noted on [place].   Hyperkalemia  This has improved. Was probably related to ARF. Noted that he is on ARB. Will need repeat Bmet next week.  CAD, NATIVE VESSEL/CAD, ARTERY BYPASS GRAFT  Stable with chronic angina. On Imdur. Continue home regimen. Trop was normal.   Chronic diastolic CHF (congestive heart failure)  Currently compensated. Given elevation in serum creatinine, Lasix was held. CXR doesn't suggest CHF. Resume Lasix at lower dose and repeat Bmet next week.   HTN (hypertension)  Stable currently. Continue home regimen   HLD (hyperlipidemia)  Stable. Continue statin   Patient has significantly improved from respiratory standpoint. He is stable for discharge. He will need home health per PT recommendations. Bmet to be checked next week to monitor renal function.   PERTINENT LABS:  The results of significant diagnostics from this hospitalization (including imaging, microbiology, ancillary and laboratory) are listed below for reference.     Labs: Basic Metabolic Panel:  Recent Labs Lab 07/02/13 1142 07/03/13 0435 07/03/13 2019 07/04/13 0449 07/05/13 0433  NA 138 137  --  138 134*  K 4.0 5.2* 5.0 4.9 4.5  CL 95* 94*  --  97 94*  CO2 35* 33*  --  31 33*  GLUCOSE 187* 143*  --  160* 149*  BUN 23 35*  --  41* 55*  CREATININE 1.48* 2.03*  --  1.66* 1.63*  CALCIUM 9.2 9.5  --  9.0 8.5  MG 2.2  --   --   --   --  PHOS 2.3  --   --   --   --    Liver Function Tests:  Recent Labs Lab 07/02/13 1142  AST 22  ALT 18  ALKPHOS 87  BILITOT 0.5  PROT 7.1  ALBUMIN 3.6   CBC:  Recent Labs Lab 07/02/13 1142 07/03/13 0435 07/04/13 0449 07/05/13 0433  WBC 9.8 16.1* 15.0* 17.1*  NEUTROABS 7.9*  --   --   --   HGB 14.3 14.0 13.8 13.5  HCT 44.0 44.1 44.9 42.3  MCV 95.7 96.5 96.6 95.3  PLT 191 221 211 216   Cardiac Enzymes:  Recent Labs Lab 07/02/13 1142   TROPONINI <0.30   BNP: BNP (last 3 results)  Recent Labs  07/02/13 1140  PROBNP 185.6   CBG:  Recent Labs Lab 07/03/13 2114 07/04/13 0724 07/04/13 1138 07/04/13 1700 07/04/13 2141  GLUCAP 135* 141* 142* 122* 114*     IMAGING STUDIES Dg Chest 2 View  07/03/2013   CLINICAL DATA:  Cough, chest congestion, shortness of breath, hypertension, COPD, coronary artery disease, asthma  EXAM: CHEST  2 VIEW  COMPARISON:  07/02/2013  FINDINGS: Minimal enlargement of cardiac silhouette post CABG.  Tortuous aorta.  Pulmonary vascularity normal.  Lungs clear.  Mild peribronchial thickening.  No pleural effusion or pneumothorax.  Bones unremarkable.  IMPRESSION: Enlargement of cardiac silhouette post CABG.  Mild bronchitic changes.   Electronically Signed   By: Ulyses Southward M.D.   On: 07/03/2013 19:28   US Renal  07/03/2013   CLINICAL DATA:  Worsening renal insufficiency. Diabetes and hypertension.  EXAM: RENAL/URINARY TRACT ULTRASOUND COMPLETE  COMPARISON:  None.  FINDINGS: Right Kidney:  Length: 11.9 cm. Suboptimally visualized due to large patient habitus. Echogenicity within normal limits. No mass or hydronephrosis visualized.  Left Kidney:  Length: 13.3 cm. Suboptimally visualized due to large patient habitus. Echogenicity within normal limits. Several cysts noted, largest measuring 3.5 cm. No definite mass or hydronephrosis visualized.  Bladder:  Appears normal for degree of bladder distention.  IMPRESSION: Technically difficult exam.  No evidence of hydronephrosis.   Electronically Signed   By: Myles Rosenthal M.D.   On: 07/03/2013 21:02   Dg Chest Portable 1 View  07/02/2013   CLINICAL DATA:  Shortness of breath.  Cough and congestion.  EXAM: PORTABLE CHEST - 1 VIEW  COMPARISON:  Two-view chest 12/19/2012.  FINDINGS: The heart size is normal. Mild interstitial coarsening is chronic. No focal airspace disease is evident. The patient is status post median sternotomy for CABG. The visualized soft  tissues and bony thorax are otherwise unremarkable.  IMPRESSION: 1. No acute cardiopulmonary disease or significant interval change. 2. Stable cardiomegaly without failure.   Electronically Signed   By: Gennette Pac M.D.   On: 07/02/2013 12:06    DISCHARGE EXAMINATION: Filed Vitals:   07/04/13 1615 07/04/13 2140 07/05/13 0443 07/05/13 0500  BP:  113/55  129/59  Pulse:  75  80  Temp:  98.1 F (36.7 C)  97.8 F (36.6 C)  TempSrc:  Oral  Oral  Resp:  20  24  Height:      Weight:    134.31 kg (296 lb 1.6 oz)  SpO2: 95% 96% 95% 96%   General appearance: alert, cooperative, appears stated age and no distress Head: Normocephalic, without obvious abnormality, atraumatic Resp: Improved air entry bilaterally. Very few wheezes heard. No crackles. Cardio: regular rate and rhythm, S1, S2 normal, no murmur, click, rub or gallop GI: soft, non-tender; bowel sounds normal;  no masses,  no organomegaly Extremities: extremities normal, atraumatic, no cyanosis or edema Pulses: 2+ and symmetric Neurologic: No focal deficits.  DISPOSITION: Home with Son  Discharge Orders   Future Appointments Provider Department Dept Phone   07/09/2013 3:15 PM Storm Frisk, MD [facility] [phone]   Future Orders Complete By Expires   Diet - low sodium heart healthy  As directed    Discharge instructions  As directed    Comments:     Please follow up with your PCP next week. Use your home oxygen as before.   Increase activity slowly  As directed       ALLERGIES: No Known Allergies  Current Discharge Medication List    START taking these medications   Details  azithromycin (ZITHROMAX) 500 MG tablet Take 1 tablet (500 mg total) by mouth daily. For 3 more days Qty: 3 tablet, Refills: 0    cefpodoxime (VANTIN) 200 MG tablet Take 1 tablet (200 mg total) by mouth every 12 (twelve) hours. For 4 more days Qty: 8 tablet, Refills: 0    fluticasone (FLOVENT HFA) 110 MCG/ACT inhaler Inhale 2  puffs into the lungs 2 (two) times daily. Qty: 1 Inhaler, Refills: 12    predniSONE (DELTASONE) 20 MG tablet Take 3 tablets once daily for 3 days, then take 2 tablets once daily for 3 days, then take 1 tablet once daily for 3 days and then Stop Qty: 18 tablet, Refills: 0    tiotropium (SPIRIVA) 18 MCG inhalation capsule Place 1 capsule (18 mcg total) into inhaler and inhale daily. Qty: 30 capsule, Refills: 12      CONTINUE these medications which have CHANGED   Details  albuterol (PROVENTIL) (2.5 MG/3ML) 0.083% nebulizer solution Take 3 mLs (2.5 mg total) by nebulization every 6 (six) hours as needed for wheezing. Qty: 75 mL, Refills: 12    furosemide (LASIX) 40 MG tablet Take 1 tablet (40 mg total) by mouth daily. Qty: 60 tablet, Refills: 3    potassium chloride SA (KLOR-CON M20) 20 MEQ tablet Take 1 tablet (20 mEq total) by mouth daily. Qty: 60 tablet, Refills: 1      CONTINUE these medications which have NOT CHANGED   Details  ARTIFICIAL TEAR OP Place 2 drops into both eyes daily as needed. For dry eyes    aspirin 325 MG tablet Take 325 mg by mouth daily.      atorvastatin (LIPITOR) 80 MG tablet TAKE ONE TABLET BY MOUTH AT BEDTIME. Qty: 90 tablet, Refills: 0    clopidogrel (PLAVIX) 75 MG tablet TAKE ONE TABLET BY MOUTH EVERY DAY WITH  BREAKFAST Qty: 30 tablet, Refills: 2    hydrALAZINE (APRESOLINE) 25 MG tablet Take 25 mg by mouth.     isosorbide mononitrate (IMDUR) 30 MG 24 hr tablet TAKE ONE TABLET BY MOUTH ONCE DAILY. Qty: 30 tablet, Refills: 1    losartan (COZAAR) 100 MG tablet TAKE ONE TABLET BY MOUTH DAILY Qty: 30 tablet, Refills: 3    Meclizine HCl (BONINE) 25 MG CHEW Chew 1 tablet by mouth 2 (two) times daily.      montelukast (SINGULAIR) 10 MG tablet Take 10 mg by mouth at bedtime.    Multiple Vitamins-Minerals (MULTIVITAMIN & MINERAL PO) Take 1 tablet by mouth daily.      nebivolol (BYSTOLIC) 10 MG tablet Take 1 tablet (10 mg total) by mouth daily. Qty:  30 tablet, Refills: 8   Associated Diagnoses: HTN (hypertension); CAD (coronary artery disease); SOB (shortness of breath)  nitroGLYCERIN (NITROSTAT) 0.4 MG SL tablet Place 1 tablet (0.4 mg total) under the tongue every 5 (five) minutes as needed. Call 9-1-1 after taking your third. Qty: 25 tablet, Refills: 3      STOP taking these medications     moxifloxacin (AVELOX) 400 MG tablet        Follow-up Information   Follow up with  Duane Lope, MD. Schedule an appointment as soon as possible for a visit in 5 days.   Specialty:  Family Medicine   Contact information:   [address] [place] [address] [phone]       TOTAL DISCHARGE TIME: 35 mins  [facility]  [facility] Pager [phone]  07/05/2013, 8:28 AM

## 2013-07-06 LAB — GLUCOSE, CAPILLARY: Glucose-Capillary: 135 mg/dL — ABNORMAL HIGH (ref 70–99)

## 2013-07-09 ENCOUNTER — Ambulatory Visit: Payer: Medicare Other | Admitting: Critical Care Medicine

## 2013-07-11 ENCOUNTER — Inpatient Hospital Stay (HOSPITAL_COMMUNITY): Payer: Medicare Other

## 2013-07-11 ENCOUNTER — Inpatient Hospital Stay (HOSPITAL_COMMUNITY)
Admission: EM | Admit: 2013-07-11 | Discharge: 2013-07-13 | DRG: 378 | Disposition: A | Discharge status: Home / Self Care | Payer: Medicare Other | Attending: Internal Medicine | Admitting: Internal Medicine

## 2013-07-11 ENCOUNTER — Encounter (HOSPITAL_COMMUNITY): Payer: Self-pay | Admitting: Emergency Medicine

## 2013-07-11 ENCOUNTER — Emergency Department (HOSPITAL_COMMUNITY): Payer: Medicare Other

## 2013-07-11 DIAGNOSIS — E785 Hyperlipidemia, unspecified: Secondary | ICD-10-CM | POA: Diagnosis present

## 2013-07-11 DIAGNOSIS — I252 Old myocardial infarction: Secondary | ICD-10-CM

## 2013-07-11 DIAGNOSIS — J449 Chronic obstructive pulmonary disease, unspecified: Secondary | ICD-10-CM | POA: Diagnosis present

## 2013-07-11 DIAGNOSIS — E119 Type 2 diabetes mellitus without complications: Secondary | ICD-10-CM | POA: Diagnosis present

## 2013-07-11 DIAGNOSIS — K259 Gastric ulcer, unspecified as acute or chronic, without hemorrhage or perforation: Secondary | ICD-10-CM | POA: Diagnosis present

## 2013-07-11 DIAGNOSIS — Z7902 Long term (current) use of antithrombotics/antiplatelets: Secondary | ICD-10-CM

## 2013-07-11 DIAGNOSIS — K921 Melena: Principal | ICD-10-CM | POA: Diagnosis present

## 2013-07-11 DIAGNOSIS — E1129 Type 2 diabetes mellitus with other diabetic kidney complication: Secondary | ICD-10-CM | POA: Diagnosis present

## 2013-07-11 DIAGNOSIS — J9610 Chronic respiratory failure, unspecified whether with hypoxia or hypercapnia: Secondary | ICD-10-CM

## 2013-07-11 DIAGNOSIS — Z9861 Coronary angioplasty status: Secondary | ICD-10-CM

## 2013-07-11 DIAGNOSIS — D62 Acute posthemorrhagic anemia: Secondary | ICD-10-CM

## 2013-07-11 DIAGNOSIS — I5042 Chronic combined systolic (congestive) and diastolic (congestive) heart failure: Secondary | ICD-10-CM | POA: Diagnosis present

## 2013-07-11 DIAGNOSIS — K922 Gastrointestinal hemorrhage, unspecified: Secondary | ICD-10-CM

## 2013-07-11 DIAGNOSIS — Z9981 Dependence on supplemental oxygen: Secondary | ICD-10-CM

## 2013-07-11 DIAGNOSIS — E860 Dehydration: Secondary | ICD-10-CM | POA: Diagnosis present

## 2013-07-11 DIAGNOSIS — I2510 Atherosclerotic heart disease of native coronary artery without angina pectoris: Secondary | ICD-10-CM | POA: Diagnosis present

## 2013-07-11 DIAGNOSIS — Z951 Presence of aortocoronary bypass graft: Secondary | ICD-10-CM

## 2013-07-11 DIAGNOSIS — I509 Heart failure, unspecified: Secondary | ICD-10-CM | POA: Diagnosis present

## 2013-07-11 DIAGNOSIS — E669 Obesity, unspecified: Secondary | ICD-10-CM | POA: Diagnosis present

## 2013-07-11 DIAGNOSIS — G4733 Obstructive sleep apnea (adult) (pediatric): Secondary | ICD-10-CM | POA: Diagnosis present

## 2013-07-11 DIAGNOSIS — R7989 Other specified abnormal findings of blood chemistry: Secondary | ICD-10-CM | POA: Diagnosis present

## 2013-07-11 DIAGNOSIS — K254 Chronic or unspecified gastric ulcer with hemorrhage: Secondary | ICD-10-CM

## 2013-07-11 DIAGNOSIS — I10 Essential (primary) hypertension: Secondary | ICD-10-CM | POA: Diagnosis present

## 2013-07-11 DIAGNOSIS — J441 Chronic obstructive pulmonary disease with (acute) exacerbation: Secondary | ICD-10-CM | POA: Diagnosis present

## 2013-07-11 DIAGNOSIS — I5032 Chronic diastolic (congestive) heart failure: Secondary | ICD-10-CM

## 2013-07-11 DIAGNOSIS — Z6841 Body Mass Index (BMI) 40.0 and over, adult: Secondary | ICD-10-CM

## 2013-07-11 DIAGNOSIS — Z7982 Long term (current) use of aspirin: Secondary | ICD-10-CM

## 2013-07-11 DIAGNOSIS — Z79899 Other long term (current) drug therapy: Secondary | ICD-10-CM

## 2013-07-11 HISTORY — DX: Chronic respiratory failure, unspecified whether with hypoxia or hypercapnia: J96.10

## 2013-07-11 HISTORY — DX: Gastro-esophageal reflux disease without esophagitis: K21.9

## 2013-07-11 HISTORY — DX: Hypothyroidism, unspecified: E03.9

## 2013-07-11 LAB — POCT I-STAT TROPONIN I: Troponin i, poc: 0 ng/mL (ref 0.00–0.08)

## 2013-07-11 LAB — URINALYSIS, ROUTINE W REFLEX MICROSCOPIC
Bilirubin Urine: NEGATIVE
Glucose, UA: NEGATIVE mg/dL
Hgb urine dipstick: NEGATIVE
Ketones, ur: NEGATIVE mg/dL
Leukocytes, UA: NEGATIVE
Protein, ur: NEGATIVE mg/dL
Urobilinogen, UA: 0.2 mg/dL (ref 0.0–1.0)
pH: 6 (ref 5.0–8.0)

## 2013-07-11 LAB — MAGNESIUM: Magnesium: 2.3 mg/dL (ref 1.5–2.5)

## 2013-07-11 LAB — FOLATE: Folate: >20 ng/mL

## 2013-07-11 LAB — CBC
Hemoglobin: 8.2 g/dL — ABNORMAL LOW (ref 13.0–17.0)
Hemoglobin: 7.9 g/dL — ABNORMAL LOW (ref 13.0–17.0)
MCH: 30.7 pg (ref 26.0–34.0)
MCHC: 31.7 g/dL (ref 30.0–36.0)
Platelets: 220 10*3/uL (ref 150–400)
RBC: 2.68 MIL/uL — ABNORMAL LOW (ref 4.22–5.81)
RBC: 2.57 MIL/uL — ABNORMAL LOW (ref 4.22–5.81)
RDW: 15.3 % (ref 11.5–15.5)
WBC: 14.6 10*3/uL — ABNORMAL HIGH (ref 4.0–10.5)

## 2013-07-11 LAB — IRON AND TIBC
Iron: 19 ug/dL — ABNORMAL LOW (ref 42–135)
Saturation Ratios: 6 % — ABNORMAL LOW (ref 20–55)
TIBC: 316 ug/dL (ref 215–435)
UIBC: 297 ug/dL (ref 125–400)

## 2013-07-11 LAB — BASIC METABOLIC PANEL
GFR calc Af Amer: 49 mL/min — ABNORMAL LOW (ref >90)
GFR calc non Af Amer: 42 mL/min — ABNORMAL LOW (ref >90)
Glucose, Bld: 71 mg/dL (ref 70–99)
Potassium: 4.4 mEq/L (ref 3.5–5.1)
Sodium: 141 mEq/L (ref 135–145)

## 2013-07-11 LAB — ABO/RH: ABO/RH(D): A NEG

## 2013-07-11 LAB — PREPARE RBC (CROSSMATCH)

## 2013-07-11 LAB — MRSA PCR SCREENING: MRSA by PCR: NEGATIVE

## 2013-07-11 LAB — PROTIME-INR
INR: 1.02 (ref 0.00–1.49)
Prothrombin Time: 13.2 seconds (ref 11.6–15.2)

## 2013-07-11 LAB — FERRITIN: Ferritin: 10 ng/mL — ABNORMAL LOW (ref 22–322)

## 2013-07-11 MED ORDER — ALBUTEROL SULFATE (5 MG/ML) 0.5% IN NEBU
2.5000 mg | INHALATION_SOLUTION | RESPIRATORY_TRACT | Status: DC | PRN
  Administered 2013-07-13: 2.5 mg via RESPIRATORY_TRACT
  Filled 2013-07-11: qty 0.5

## 2013-07-11 MED ORDER — FUROSEMIDE 10 MG/ML IJ SOLN
20.0000 mg | Freq: Once | INTRAMUSCULAR | Status: AC
  Administered 2013-07-11: 20 mg via INTRAVENOUS
  Filled 2013-07-11: qty 2

## 2013-07-11 MED ORDER — ALBUTEROL SULFATE (5 MG/ML) 0.5% IN NEBU: 2.5000 mg | INHALATION_SOLUTION | RESPIRATORY_TRACT | Status: DC

## 2013-07-11 MED ORDER — SODIUM CHLORIDE 0.9 % IJ SOLN
10.0000 mL | Freq: Two times a day (BID) | INTRAMUSCULAR | Status: DC
  Administered 2013-07-11 – 2013-07-12 (×2): 10 mL via INTRAVENOUS

## 2013-07-11 MED ORDER — SODIUM CHLORIDE 0.9 % IJ SOLN
3.0000 mL | Freq: Two times a day (BID) | INTRAMUSCULAR | Status: DC
  Administered 2013-07-12: 3 mL via INTRAVENOUS

## 2013-07-11 MED ORDER — OXYCODONE HCL 5 MG PO TABS: 5.0000 mg | ORAL_TABLET | ORAL | Status: DC | PRN

## 2013-07-11 MED ORDER — IPRATROPIUM BROMIDE 0.02 % IN SOLN: 0.5000 mg | RESPIRATORY_TRACT | Status: DC | PRN

## 2013-07-11 MED ORDER — SODIUM CHLORIDE 0.9 % IV BOLUS (SEPSIS)
500.0000 mL | Freq: Once | INTRAVENOUS | Status: AC
  Administered 2013-07-11: 500 mL via INTRAVENOUS

## 2013-07-11 MED ORDER — SODIUM CHLORIDE 0.9 % IV SOLN
8.0000 mg/h | INTRAVENOUS | Status: DC
  Administered 2013-07-11 – 2013-07-13 (×4): 8 mg/h via INTRAVENOUS
  Filled 2013-07-11 (×10): qty 80

## 2013-07-11 MED ORDER — TIOTROPIUM BROMIDE MONOHYDRATE 18 MCG IN CAPS
18.0000 ug | ORAL_CAPSULE | Freq: Every day | RESPIRATORY_TRACT | Status: DC
  Administered 2013-07-11 – 2013-07-13 (×3): 18 ug via RESPIRATORY_TRACT
  Filled 2013-07-11: qty 5

## 2013-07-11 MED ORDER — MONTELUKAST SODIUM 10 MG PO TABS
10.0000 mg | ORAL_TABLET | Freq: Every day | ORAL | Status: DC
  Administered 2013-07-11 – 2013-07-12 (×2): 10 mg via ORAL
  Filled 2013-07-11 (×3): qty 1

## 2013-07-11 MED ORDER — SODIUM CHLORIDE 0.9 % IJ SOLN
10.0000 mL | INTRAMUSCULAR | Status: DC | PRN
  Administered 2013-07-11: 10 mL via INTRAVENOUS

## 2013-07-11 MED ORDER — NEBIVOLOL HCL 10 MG PO TABS
10.0000 mg | ORAL_TABLET | Freq: Every day | ORAL | Status: DC
  Administered 2013-07-12 – 2013-07-13 (×2): 10 mg via ORAL
  Filled 2013-07-11 (×3): qty 1

## 2013-07-11 MED ORDER — FLUTICASONE PROPIONATE HFA 110 MCG/ACT IN AERO
2.0000 | INHALATION_SPRAY | Freq: Two times a day (BID) | RESPIRATORY_TRACT | Status: DC
  Administered 2013-07-11 – 2013-07-13 (×4): 2 via RESPIRATORY_TRACT
  Filled 2013-07-11: qty 12

## 2013-07-11 MED ORDER — ONDANSETRON HCL 4 MG/2ML IJ SOLN: 4.0000 mg | Freq: Four times a day (QID) | INTRAMUSCULAR | Status: DC | PRN

## 2013-07-11 MED ORDER — ACETAMINOPHEN 325 MG PO TABS
650.0000 mg | ORAL_TABLET | Freq: Once | ORAL | Status: AC
  Administered 2013-07-11: 650 mg via ORAL
  Filled 2013-07-11: qty 2

## 2013-07-11 MED ORDER — ONDANSETRON HCL 4 MG PO TABS: 4.0000 mg | ORAL_TABLET | Freq: Four times a day (QID) | ORAL | Status: DC | PRN

## 2013-07-11 MED ORDER — IPRATROPIUM BROMIDE 0.02 % IN SOLN: 0.5000 mg | RESPIRATORY_TRACT | Status: DC

## 2013-07-11 MED ORDER — PANTOPRAZOLE SODIUM 40 MG IV SOLR
40.0000 mg | Freq: Once | INTRAVENOUS | Status: AC
  Administered 2013-07-11: 40 mg via INTRAVENOUS
  Filled 2013-07-11: qty 40

## 2013-07-11 MED ORDER — ATORVASTATIN CALCIUM 80 MG PO TABS
80.0000 mg | ORAL_TABLET | Freq: Every day | ORAL | Status: DC
  Administered 2013-07-11 – 2013-07-12 (×2): 80 mg via ORAL
  Filled 2013-07-11 (×3): qty 1

## 2013-07-11 MED ORDER — ACETAMINOPHEN 325 MG PO TABS: 650.0000 mg | ORAL_TABLET | Freq: Four times a day (QID) | ORAL | Status: DC | PRN

## 2013-07-11 MED ORDER — PANTOPRAZOLE SODIUM 40 MG IV SOLR: 40.0000 mg | Freq: Two times a day (BID) | INTRAVENOUS | Status: DC

## 2013-07-11 MED ORDER — SODIUM CHLORIDE 0.9 % IJ SOLN
10.0000 mL | Freq: Two times a day (BID) | INTRAMUSCULAR | Status: DC
  Administered 2013-07-12: 10 mL via INTRAVENOUS

## 2013-07-11 MED ORDER — DIPHENHYDRAMINE HCL 25 MG PO CAPS
25.0000 mg | ORAL_CAPSULE | Freq: Once | ORAL | Status: AC
  Administered 2013-07-11: 25 mg via ORAL
  Filled 2013-07-11: qty 1

## 2013-07-11 MED ORDER — ALUM & MAG HYDROXIDE-SIMETH 200-200-20 MG/5ML PO SUSP: 30.0000 mL | Freq: Four times a day (QID) | ORAL | Status: DC | PRN

## 2013-07-11 MED ORDER — MECLIZINE HCL 25 MG PO TABS
25.0000 mg | ORAL_TABLET | Freq: Two times a day (BID) | ORAL | Status: DC
  Administered 2013-07-11 – 2013-07-13 (×4): 25 mg via ORAL
  Filled 2013-07-11 (×5): qty 1

## 2013-07-11 MED ORDER — ACETAMINOPHEN 650 MG RE SUPP: 650.0000 mg | Freq: Four times a day (QID) | RECTAL | Status: DC | PRN

## 2013-07-11 MED ORDER — SODIUM CHLORIDE 0.9 % IV SOLN
INTRAVENOUS | Status: DC
  Administered 2013-07-11 – 2013-07-13 (×3): via INTRAVENOUS

## 2013-07-11 MED ORDER — NITROGLYCERIN 0.4 MG SL SUBL: 0.4000 mg | SUBLINGUAL_TABLET | SUBLINGUAL | Status: DC | PRN

## 2013-07-11 NOTE — ED Notes (Signed)
Lab collection delayed. Difficulty in obtaining labs. Dr. Gwendolyn Grant informed

## 2013-07-11 NOTE — ED Provider Notes (Signed)
CSN: [id number]     Arrival date & time 07/11/13  1024 History   First MD Initiated Contact with Patient 07/11/13 1033     Chief Complaint  Patient presents with  . Shortness of Breath   (Consider location/radiation/quality/duration/timing/severity/associated sxs/prior Treatment) Patient is a 79 y.o. male presenting with weakness. The history is provided by the patient.  Weakness This is a new problem. The current episode started more than 2 days ago. The problem occurs constantly. Associated symptoms include chest pain (chronic, sharp, central, nonradiating, worse with exertion). The symptoms are aggravated by walking. The symptoms are relieved by rest. He has tried nothing for the symptoms. The treatment provided no relief.    Past Medical History  Diagnosis Date  . CAD (coronary artery disease)     a. s/p CABG x 3 in 1995;  b.Lexi MV 10/2011: NL EF, No isch/infarct;;  d.  NSTEMI 12/2011 -> Cath 6/21: LM 20-30, LAD 90, RI 95, LCX 15m, RCA 90/194m (treated w/ 2.0x16 Promus DES), VG->RCA ok, VG->RI 70-12m (treated w/ 4.0x28 Promus), LIMA->LAD ok, EF 55-60%.  Marland Kitchen HTN (hypertension)   . HLD (hyperlipidemia)   . Obesity   . Syncope   . COPD (chronic obstructive pulmonary disease)   . Hypoxia   . History of pneumonia     "once"  . HOH (hard of hearing)     left ear  . Asthma   . Sleep apnea   . Arthritis     "all over"  . Dyspnea   . Orthopnea     a. Has been sleeping in a recliner x 30 yrs  . Chronic diastolic CHF (congestive heart failure)     a. Echo 10/2011: EF 55-60%;  b. elevated EDP req diuresis.   Past Surgical History  Procedure Laterality Date  . Functional endoscopic sinus surgery  2006  . Hydrocele excision  2005  . Prostate surgery  1998  . Inguinal hernia repair  2006    right  . Coronary artery bypass graft  1995    CABG X3  . Excisional hemorrhoidectomy      "twice cut them out; burnt them out once"   History reviewed. No pertinent family history. History   Substance Use Topics  . Smoking status: Never Smoker   . Smokeless tobacco: Never Used  . Alcohol Use: Yes     Comment: 01/06/12 "used to drink a little; last drink ~ 15-20 yr ago"    Review of Systems  Cardiovascular: Positive for chest pain (chronic, sharp, central, nonradiating, worse with exertion).  Gastrointestinal: Positive for anal bleeding.  Neurological: Positive for weakness.  All other systems reviewed and are negative.    Allergies  Review of patient's allergies indicates no known allergies.  Home Medications   Current Outpatient Rx  Name  Route  Sig  Dispense  Refill  . albuterol (PROVENTIL) (2.5 MG/3ML) 0.083% nebulizer solution   Nebulization   Take 3 mLs (2.5 mg total) by nebulization every 6 (six) hours as needed for wheezing.   75 mL   12   . ARTIFICIAL TEAR OP   Both Eyes   Place 2 drops into both eyes daily as needed. For dry eyes         . aspirin 325 MG tablet   Oral   Take 325 mg by mouth daily.           Marland Kitchen atorvastatin (LIPITOR) 80 MG tablet      TAKE ONE TABLET BY MOUTH AT  BEDTIME.   90 tablet   0   . azithromycin (ZITHROMAX) 500 MG tablet   Oral   Take 1 tablet (500 mg total) by mouth daily. For 3 more days   3 tablet   0   . cefpodoxime (VANTIN) 200 MG tablet   Oral   Take 1 tablet (200 mg total) by mouth every 12 (twelve) hours. For 4 more days   8 tablet   0   . clopidogrel (PLAVIX) 75 MG tablet      TAKE ONE TABLET BY MOUTH EVERY DAY WITH  BREAKFAST   30 tablet   2   . fluticasone (FLOVENT HFA) 110 MCG/ACT inhaler   Inhalation   Inhale 2 puffs into the lungs 2 (two) times daily.   1 Inhaler   12   . furosemide (LASIX) 40 MG tablet   Oral   Take 1 tablet (40 mg total) by mouth daily.   60 tablet   3   . hydrALAZINE (APRESOLINE) 25 MG tablet   Oral   Take 25 mg by mouth.          . isosorbide mononitrate (IMDUR) 30 MG 24 hr tablet      TAKE ONE TABLET BY MOUTH ONCE DAILY.   30 tablet   1   . losartan  (COZAAR) 100 MG tablet      TAKE ONE TABLET BY MOUTH DAILY   30 tablet   3   . Meclizine HCl (BONINE) 25 MG CHEW   Oral   Chew 1 tablet by mouth 2 (two) times daily.           . montelukast (SINGULAIR) 10 MG tablet   Oral   Take 10 mg by mouth at bedtime.         . Multiple Vitamins-Minerals (MULTIVITAMIN & MINERAL PO)   Oral   Take 1 tablet by mouth daily.           . nebivolol (BYSTOLIC) 10 MG tablet   Oral   Take 1 tablet (10 mg total) by mouth daily.   30 tablet   8   . nitroGLYCERIN (NITROSTAT) 0.4 MG SL tablet   Sublingual   Place 1 tablet (0.4 mg total) under the tongue every 5 (five) minutes as needed. Call 9-1-1 after taking your third.   25 tablet   3   . potassium chloride SA (KLOR-CON M20) 20 MEQ tablet   Oral   Take 1 tablet (20 mEq total) by mouth daily.   60 tablet   1   . predniSONE (DELTASONE) 20 MG tablet      Take 3 tablets once daily for 3 days, then take 2 tablets once daily for 3 days, then take 1 tablet once daily for 3 days and then Stop   18 tablet   0   . tiotropium (SPIRIVA) 18 MCG inhalation capsule   Inhalation   Place 1 capsule (18 mcg total) into inhaler and inhale daily.   30 capsule   12    BP 118/46  Pulse 70  Temp(Src) 98.3 F (36.8 C) (Oral)  Resp 24  Ht 5\' 11"  (1.803 m)  Wt 301 lb (136.533 kg)  BMI 42.00 kg/m2  SpO2 95% Physical Exam  Nursing note and vitals reviewed. Constitutional: He is oriented to person, place, and time. He appears well-developed and well-nourished. No distress.  HENT:  Head: Normocephalic and atraumatic.  Mouth/Throat: No oropharyngeal exudate.  Eyes: EOM are normal. Pupils are equal, round,  and reactive to light.  Neck: Normal range of motion. Neck supple.  Cardiovascular: Normal rate and regular rhythm.  Exam reveals no friction rub.   No murmur heard. Pulmonary/Chest: Effort normal. No respiratory distress. He has no decreased breath sounds. He has no wheezes. He has no rales.   Abdominal: Soft. He exhibits no distension. There is no tenderness. There is no rebound.  Genitourinary: Guaiac positive stool.  Musculoskeletal: Normal range of motion. He exhibits no edema.  Neurological: He is alert and oriented to person, place, and time. No cranial nerve deficit. He exhibits normal muscle tone.  Skin: Skin is warm. No rash noted. He is not diaphoretic.    ED Course  Procedures (including critical care time) Labs Review Labs Reviewed  OCCULT BLOOD, POC DEVICE - Abnormal; Notable for the following:    Fecal Occult Bld POSITIVE (*)    All other components within normal limits  CBC  BASIC METABOLIC PANEL  PROTIME-INR  TYPE AND SCREEN   Imaging Review No results found.  EKG Interpretation    Date/Time:  Wednesday July 11 2013 10:27:05 EST Ventricular Rate:  67 PR Interval:  202 QRS Duration: 102 QT Interval:  380 QTC Calculation: 401 R Axis:   -7 Text Interpretation:  Normal sinus rhythm Low voltage QRS Similar to previous Confirmed by Gwendolyn Grant  MD, Aune Adami ([id number]) on 07/11/2013 10:48:26 AM            MDM   1. GI bleed   2. Acute blood loss anemia   3. Chronic diastolic CHF (congestive heart failure)   4. Chronic respiratory failure    79 year old male with history of COPD, CAD with bypass and stents, CHF presents with anemia and shortness of breath. Which is recently admitted for COPD. He states his shortness of breath has improved since leaving the hospital in the past week, however his weakness has gotten worse. He went to see his PCP and hemoglobin are 7.8.  prior to admission one week ago, his hemoglobin was 14. Patient's oxygen requirements 3 L, he is breathing well on 3 L here. Patient has black stool on rectal exam, hemoccult positive. Hgb 8.2 here. GI consulted, will see patient. Patient admitted to medicine.    Dagmar Hait, MD 07/11/13 [phone]

## 2013-07-11 NOTE — ED Notes (Signed)
Admitting MD at bedside.

## 2013-07-11 NOTE — ED Notes (Signed)
Patient transported to X-ray 

## 2013-07-11 NOTE — ED Notes (Signed)
Attempted to call report, room not clean

## 2013-07-11 NOTE — ED Notes (Signed)
Phlebotomy at bedside.

## 2013-07-11 NOTE — Consult Note (Signed)
Unassigned Patient  Reason for Consult: Anemia and Melena. Referring Physician: [facility]  [name] HPI: This is an 79 year old male with a PMH of CAD, s/p drug-eluting stent in 12/2011, COPD, obesity, diastolic CHF, and sleep apnea who was admitted for symptatic anemia.  He was recently discharged for a COPD exacerbation, he reports that he never really felt well after his discharge.  At home he started to notice melena and there was an association with mid chest pain relieved with drinking milk.  In the ER he was noted to have an HGB of 7.8 and an elevated WBC of 15.8.  No significant history of GERD.  He does not think he had a colonoscopy in the past and there is no known family history of colon cancer.  Past Medical History  Diagnosis Date  . CAD (coronary artery disease)     a. s/p CABG x 3 in 1995;  b.Lexi MV 10/2011: NL EF, No isch/infarct;;  d.  NSTEMI 12/2011 -> Cath 6/21: LM 20-30, LAD 90, RI 95, LCX 16m, RCA 90/186m (treated w/ 2.0x16 Promus DES), VG->RCA ok, VG->RI 70-92m (treated w/ 4.0x28 Promus), LIMA->LAD ok, EF 55-60%.  Marland Kitchen HTN (hypertension)   . HLD (hyperlipidemia)   . Obesity   . Syncope   . COPD (chronic obstructive pulmonary disease)   . Hypoxia   . History of pneumonia     "once"  . HOH (hard of hearing)     left ear  . Asthma   . Sleep apnea   . Arthritis     "all over"  . Dyspnea   . Orthopnea     a. Has been sleeping in a recliner x 30 yrs  . Chronic diastolic CHF (congestive heart failure)     a. Echo 10/2011: EF 55-60%;  b. elevated EDP req diuresis.  . Chronic respiratory failure 07/11/2013    Past Surgical History  Procedure Laterality Date  . Functional endoscopic sinus surgery  2006  . Hydrocele excision  2005  . Prostate surgery  1998  . Inguinal hernia repair  2006    right  . Coronary artery bypass graft  1995    CABG X3  . Excisional hemorrhoidectomy      "twice cut them out; burnt them out once"    History reviewed. No  pertinent family history.  Social History:  reports that he has never smoked. He has never used smokeless tobacco. He reports that he drinks alcohol. He reports that he does not use illicit drugs.  Allergies:  Allergies  Allergen Reactions  . Other     Cream cheese-makes very sick    Medications:  Scheduled: . [START ON 07/15/2013] pantoprazole (PROTONIX) IV  40 mg Intravenous Q12H   Continuous: . sodium chloride 75 mL/hr at 07/11/13 1435  . pantoprozole (PROTONIX) infusion 8 mg/hr (07/11/13 1435)    Results for orders placed during the hospital encounter of 07/11/13 (from the past 24 hour(s))  CBC     Status: Abnormal   Collection Time    07/11/13 10:31 AM      Result Value Range   WBC 15.8 (*) 4.0 - 10.5 K/uL   RBC 2.68 (*) 4.22 - 5.81 MIL/uL   Hemoglobin 8.2 (*) 13.0 - 17.0 g/dL   HCT 86.5 (*) 78.4 - 69.6 %   MCV 96.6  78.0 - 100.0 fL   MCH 30.6  26.0 - 34.0 pg   MCHC 31.7  30.0 - 36.0 g/dL  RDW 15.3  11.5 - 15.5 %   Platelets 239  150 - 400 K/uL  OCCULT BLOOD, POC DEVICE     Status: Abnormal   Collection Time    07/11/13 10:45 AM      Result Value Range   Fecal Occult Bld POSITIVE (*) NEGATIVE  POCT I-STAT TROPONIN I     Status: None   Collection Time    07/11/13 11:16 AM      Result Value Range   Troponin i, poc 0.00  0.00 - 0.08 ng/mL   Comment 3           CG4 I-STAT (LACTIC ACID)     Status: None   Collection Time    07/11/13 11:19 AM      Result Value Range   Lactic Acid, Venous 1.32  0.5 - 2.2 mmol/L  TYPE AND SCREEN     Status: None   Collection Time    07/11/13 12:04 PM      Result Value Range   ABO/RH(D) A NEG     Antibody Screen NEG     Sample Expiration 07/14/2013    ABO/RH     Status: None   Collection Time    07/11/13 12:04 PM      Result Value Range   ABO/RH(D) A NEG    PROTIME-INR     Status: None   Collection Time    07/11/13 12:10 PM      Result Value Range   Prothrombin Time 13.2  11.6 - 15.2 seconds   INR 1.02  0.00 - 1.49   BASIC METABOLIC PANEL     Status: Abnormal   Collection Time    07/11/13 12:30 PM      Result Value Range   Sodium 141  135 - 145 mEq/L   Potassium 4.4  3.5 - 5.1 mEq/L   Chloride 104  96 - 112 mEq/L   CO2 33 (*) 19 - 32 mEq/L   Glucose, Bld 71  70 - 99 mg/dL   BUN 40 (*) 6 - 23 mg/dL   Creatinine, Ser 4.54 (*) 0.50 - 1.35 mg/dL   Calcium 8.3 (*) 8.4 - 10.5 mg/dL   GFR calc non Af Amer 42 (*) >90 mL/min   GFR calc Af Amer 49 (*) >90 mL/min     Dg Chest 2 View  07/11/2013   CLINICAL DATA:  Dyspnea and chest pain.  EXAM: CHEST  2 VIEW  COMPARISON:  July 03, 2013.  FINDINGS: The lungs are well-expanded. The interstitial markings are coarse similar to those seen previously. Increased density just above the dome of the left hemidiaphragm is present today and may reflect atelectasis or developing infiltrate. The cardiopericardial silhouette is mildly enlarged. The patient has undergone previous CABG. The pulmonary vascularity is not engorged. There is no significant pleural effusion. The observed portions of the bony thorax exhibit no acute abnormality.  IMPRESSION: 1. There may be developing atelectasis or pneumonia at the left lung base likely posteriorly. 2. Low-grade compensated CHF is suspected little changed from the previous study. .   Electronically Signed   By: David  [place]   On: 07/11/2013 11:59    ROS:  As stated above in the HPI otherwise negative.  Blood pressure 99/52, pulse 61, temperature 98.3 F (36.8 C), temperature source Oral, resp. rate 16, height 5\' 11"  (1.803 m), weight 301 lb (136.533 kg), SpO2 100.00%.    PE: Gen: NAD, Alert and Oriented HEENT:  [place]/AT, EOMI Neck: Supple,  no LAD Lungs: CTA Bilaterally CV: RRR without M/G/R ABM: Soft, mild epigastric discomfot, +BS Ext: No C/C/E  Assessment/Plan: 1) Melena. 2) Anemia. 3) COPD. 4) CHF.   The patient clearly demonstrates a GI bleed.  The presumption is that it is an upper source with his melena.  There  is some complaint of problems with mid chest pain and some epigastric discomfort with palpation.  He may have problems with an esophagitis that is exacerbated with his anticoagulation.  Further evaluation with an EGD is warranted.  Plan: 1) EGD tomorrow, however, reassessment of his clinical stability will be required before the procedure. 2) Agree with blood transfusions. 3) Follow HGB.  Anslie Spadafora D 07/11/2013, 2:40 PM

## 2013-07-11 NOTE — Procedures (Signed)
Central Venous Catheter Insertion Procedure Note CLIFTON SAFLEY [id number] 14-Sep-1930  Procedure: Insertion of Central Venous Catheter Indications: Drug and/or fluid administration  Procedure Details Consent: Risks of procedure as well as the alternatives and risks of each were explained to the (patient/caregiver).  Consent for procedure obtained. Time Out: Verified patient identification, verified procedure, site/side was marked, verified correct patient position, special equipment/implants available, medications/allergies/relevent history reviewed, required imaging and test results available.  Performed  Maximum sterile technique was used including antiseptics, cap, gloves, gown, hand hygiene, mask and sheet. Skin prep: Chlorhexidine; local anesthetic administered A antimicrobial bonded/coated triple lumen catheter was placed in the left internal jugular vein using the Seldinger technique.  Evaluation Blood flow good Complications: No apparent complications Patient did tolerate procedure well. Chest X-ray ordered to verify placement.  CXR: pending.  U/S used in placement.  YACOUB,WESAM 07/11/2013, 2:16 PM

## 2013-07-11 NOTE — ED Notes (Signed)
Pt here from MD office with c/o sob and low hbg 7.8 at md office, pt was d/c recently from [facility] for a copd exc. Pt is on 3 liters of O2 at home

## 2013-07-11 NOTE — H&P (Signed)
[facility] History and Physical  [name] WJX:[id number] DOB: 01/03/1931 DOA: 07/11/2013  Referring physician: Dr. Gwendolyn Grant PCP:  Duane Lope, MD  Cardiologist: Dr. Daleen Squibb  Chief Complaint: Abnormal labs at PCPs office  HPI: [name] is a 79 y.o. male  With significant cardiac history of coronary artery disease status post CABG times 10/04/1993, nondistended he June 2013 status post treatment with drug-eluting stent EF of 55-60%, hypertension, hyperlipidemia, on chronic Plavix and aspirin, history of chronic respiratory failure secondary to COPD on chronic oxygen, obstructive sleep apnea, history of chronic diastolic CHF recently hospitalized from 07/02/2013 through 07/05/2013 foot acute COPD exacerbation presented to the ED from her PCPs office with abnormal labs per patient. Patient states ever since he was discharged on the night of discharge when he went home he had some mid chest pain drank some milk and risk factors which he self the pain which had a component of burning. Patient stated that for the past 6 days has been having this an external chest pain which has slowly been worsening with relief with milk and risk factors. Patient stated that he followed up at his PCPs office for HFU, labs taken and Hgb noted at 7.8. Patient sent to ED. Patient states has been having black stools over the past 6 days since discharge. Patient denies any fevers, no nausea, no dysuria, no emesis, no abdominal pain, no diarrhea, no constipation, no dysuria. She does endorse some wheezing and unchanged shortness of breath since discharge. Patient denies any hematemesis. Patient denies any hematochezia. Patient was seen in the emergency room labs obtained basic metabolic profile had a BUN of 40 creatinine of 1.5 otherwise was within normal limits. Lactic acid was at 1.32. CBC had a white count of 15.8 hemoglobin of 8.2 with normal platelets. Patient denies any NSAID use. Patient states only on  aspirin and Plavix. FOBT which was done in the ED was positive. We were called to admit the patient for further evaluation and management.   Review of Systems:  Constitutional:  No weight loss, night sweats, Fevers, chills, fatigue.  HEENT:  No headaches, Difficulty swallowing,Tooth/dental problems,Sore throat,  No sneezing, itching, ear ache, nasal congestion, post nasal drip,  Cardio-vascular:  No chest pain, Orthopnea, PND, swelling in lower extremities, anasarca, dizziness, palpitations  GI:  No heartburn, indigestion, abdominal pain, nausea, vomiting, diarrhea, change in bowel habits, loss of appetite  Resp:  No shortness of breath with exertion or at rest. No excess mucus, no productive cough, No non-productive cough, No coughing up of blood.No change in color of mucus.No wheezing.No chest wall deformity  Skin:  no rash or lesions.  GU:  no dysuria, change in color of urine, no urgency or frequency. No flank pain.  Musculoskeletal:  No joint pain or swelling. No decreased range of motion. No back pain.  Psych:  No change in mood or affect. No depression or anxiety. No memory loss.   Past Medical History  Diagnosis Date  . CAD (coronary artery disease)     a. s/p CABG x 3 in 1995;  b.Lexi MV 10/2011: NL EF, No isch/infarct;;  d.  NSTEMI 12/2011 -> Cath 6/21: LM 20-30, LAD 90, RI 95, LCX 79m, RCA 90/125m (treated w/ 2.0x16 Promus DES), VG->RCA ok, VG->RI 70-33m (treated w/ 4.0x28 Promus), LIMA->LAD ok, EF 55-60%.  Marland Kitchen HTN (hypertension)   . HLD (hyperlipidemia)   . Obesity   . Syncope   . COPD (chronic obstructive pulmonary disease)   . Hypoxia   .  History of pneumonia     "once"  . HOH (hard of hearing)     left ear  . Asthma   . Sleep apnea   . Arthritis     "all over"  . Dyspnea   . Orthopnea     a. Has been sleeping in a recliner x 30 yrs  . Chronic diastolic CHF (congestive heart failure)     a. Echo 10/2011: EF 55-60%;  b. elevated EDP req diuresis.   Past  Surgical History  Procedure Laterality Date  . Functional endoscopic sinus surgery  2006  . Hydrocele excision  2005  . Prostate surgery  1998  . Inguinal hernia repair  2006    right  . Coronary artery bypass graft  1995    CABG X3  . Excisional hemorrhoidectomy      "twice cut them out; burnt them out once"   Social History:  reports that he has never smoked. He has never used smokeless tobacco. He reports that he drinks alcohol. He reports that he does not use illicit drugs.  Allergies  Allergen Reactions  . Other     Cream cheese-makes very sick    History reviewed. No pertinent family history.   Prior to Admission medications   Medication Sig Start Date End Date Taking? Authorizing Provider  albuterol (PROVENTIL HFA;VENTOLIN HFA) 108 (90 BASE) MCG/ACT inhaler Inhale 2 puffs into the lungs every 6 (six) hours as needed for wheezing or shortness of breath.   Yes Historical Provider, MD  albuterol (PROVENTIL) (2.5 MG/3ML) 0.083% nebulizer solution Take 3 mLs (2.5 mg total) by nebulization every 6 (six) hours as needed for wheezing. 07/05/13  Yes Osvaldo Shipper, MD  ARTIFICIAL TEAR OP Place 2 drops into both eyes daily as needed. For dry eyes   Yes Historical Provider, MD  aspirin 325 MG tablet Take 325 mg by mouth daily.     Yes Historical Provider, MD  atorvastatin (LIPITOR) 80 MG tablet Take 80 mg by mouth at bedtime.   Yes Historical Provider, MD  azithromycin (ZITHROMAX) 500 MG tablet Take 1 tablet (500 mg total) by mouth daily. For 3 more days 07/05/13  Yes Osvaldo Shipper, MD  cefpodoxime (VANTIN) 200 MG tablet Take 1 tablet (200 mg total) by mouth every 12 (twelve) hours. For 4 more days 07/05/13  Yes Osvaldo Shipper, MD  clopidogrel (PLAVIX) 75 MG tablet Take 75 mg by mouth daily with breakfast.   Yes Historical Provider, MD  fluticasone (FLOVENT HFA) 110 MCG/ACT inhaler Inhale 2 puffs into the lungs 2 (two) times daily. 07/05/13  Yes Osvaldo Shipper, MD  furosemide (LASIX) 40  MG tablet Take 1 tablet (40 mg total) by mouth daily. 07/05/13 07/05/14 Yes Osvaldo Shipper, MD  hydrALAZINE (APRESOLINE) 25 MG tablet Take 25 mg by mouth 3 (three) times daily.  11/14/12  Yes Storm Frisk, MD  isosorbide mononitrate (IMDUR) 30 MG 24 hr tablet Take 30 mg by mouth daily.   Yes Historical Provider, MD  losartan (COZAAR) 100 MG tablet Take 100 mg by mouth daily.   Yes Historical Provider, MD  Meclizine HCl (BONINE) 25 MG CHEW Chew 1 tablet by mouth 2 (two) times daily.     Yes Historical Provider, MD  montelukast (SINGULAIR) 10 MG tablet Take 10 mg by mouth at bedtime.   Yes Historical Provider, MD  Multiple Vitamins-Minerals (MULTIVITAMIN & MINERAL PO) Take 1 tablet by mouth daily.     Yes Historical Provider, MD  nebivolol (BYSTOLIC) 10 MG  tablet Take 1 tablet (10 mg total) by mouth daily. 12/19/12  Yes Gaylord Shih, MD  nitroGLYCERIN (NITROSTAT) 0.4 MG SL tablet Place 0.4 mg under the tongue every 5 (five) minutes as needed for chest pain.   Yes Historical Provider, MD  potassium chloride SA (K-DUR,KLOR-CON) 20 MEQ tablet Take 20 mEq by mouth.   Yes Historical Provider, MD  tiotropium (SPIRIVA) 18 MCG inhalation capsule Place 1 capsule (18 mcg total) into inhaler and inhale daily. 07/05/13  Yes Osvaldo Shipper, MD  iron polysaccharides (NU-IRON) 150 MG capsule Take 150 mg by mouth 2 (two) times daily.    Historical Provider, MD  omeprazole (PRILOSEC) 20 MG capsule Take 20 mg by mouth daily.    Historical Provider, MD   Physical Exam: Filed Vitals:   07/11/13 1245  BP: 99/52  Pulse: 61  Temp:   Resp:     BP 99/52  Pulse 61  Temp(Src) 98.3 F (36.8 C) (Oral)  Resp 16  Ht 5\' 11"  (1.803 m)  Wt 136.533 kg (301 lb)  BMI 42.00 kg/m2  SpO2 100%  General:  Appears calm and comfortable Eyes: PERRL, normal lids, irises & conjunctiva ENT: grossly normal hearing, lips & tongue Neck: no LAD, masses or thyromegaly Cardiovascular: RRR, no m/r/g. No LE edema. Telemetry: SR, no  arrhythmias  Respiratory: CTA bilaterally, no w/r/r. Normal respiratory effort. Abdomen: soft, ntnd Skin: no rash or induration seen on limited exam Musculoskeletal: grossly normal tone BUE/BLE Psychiatric: grossly normal mood and affect, speech fluent and appropriate Neurologic: grossly non-focal.          Labs on Admission:  Basic Metabolic Panel:  Recent Labs Lab 07/05/13 0433 07/11/13 1230  NA 134* 141  K 4.5 4.4  CL 94* 104  CO2 33* 33*  GLUCOSE 149* 71  BUN 55* 40*  CREATININE 1.63* 1.50*  CALCIUM 8.5 8.3*   Liver Function Tests: No results found for this basename: AST, ALT, ALKPHOS, BILITOT, PROT, ALBUMIN,  in the last 168 hours No results found for this basename: LIPASE, AMYLASE,  in the last 168 hours No results found for this basename: AMMONIA,  in the last 168 hours CBC:  Recent Labs Lab 07/05/13 0433 07/11/13 1031  WBC 17.1* 15.8*  HGB 13.5 8.2*  HCT 42.3 25.9*  MCV 95.3 96.6  PLT 216 239   Cardiac Enzymes: No results found for this basename: CKTOTAL, CKMB, CKMBINDEX, TROPONINI,  in the last 168 hours  BNP (last 3 results)  Recent Labs  07/02/13 1140  PROBNP 185.6   CBG:  Recent Labs Lab 07/04/13 1700 07/04/13 2141 07/05/13 0740  GLUCAP 122* 114* 135*    Radiological Exams on Admission: Dg Chest 2 View  07/11/2013   CLINICAL DATA:  Dyspnea and chest pain.  EXAM: CHEST  2 VIEW  COMPARISON:  July 03, 2013.  FINDINGS: The lungs are well-expanded. The interstitial markings are coarse similar to those seen previously. Increased density just above the dome of the left hemidiaphragm is present today and may reflect atelectasis or developing infiltrate. The cardiopericardial silhouette is mildly enlarged. The patient has undergone previous CABG. The pulmonary vascularity is not engorged. There is no significant pleural effusion. The observed portions of the bony thorax exhibit no acute abnormality.  IMPRESSION: 1. There may be developing  atelectasis or pneumonia at the left lung base likely posteriorly. 2. Low-grade compensated CHF is suspected little changed from the previous study. .   Electronically Signed   By: David  [place]  On: 07/11/2013 11:59    EKG: Independently reviewed. Normal sinus rhythm. Q wave in lead 3.  Assessment/Plan Principal Problem:   GI bleed Active Problems:   CAD, NATIVE VESSEL   Obstructive chronic bronchitis with exacerbation   Type 2 diabetes mellitus   Chronic diastolic CHF (congestive heart failure)   HTN (hypertension)   HLD (hyperlipidemia)   Acute blood loss anemia   Elevated serum creatinine   Dehydration   GIB (gastrointestinal bleeding)   #1 GI bleed Patient is presenting with melanotic stools some generalized weakness some mid epigastric pain improved with milk. No prior history of peptic ulcer disease. Patient's hemoglobin on admission is 8.2 and was 13.5 about 6 days prior to admission. Patient on aspirin and Plavix chronically. Patient denies any NSAID use. Likely upper GI bleed. Will admit the patient to the step down unit as his blood pressure is borderline in the emergency room. Place on a Protonix drip. Keep n.p.o. Hold aspirin. Hold Plavix. Check a CBC every 8 hours. Hold antihypertensive medications. IV fluids. Will consult with gastroenterology for further evaluation and management.  #2 acute blood loss anemia Likely secondary to problem #1. Check an anemia panel. Type and screen. Check a CBC every 8 hours. Follow H&H. Transfuse for hemoglobin less than 8. Follow H&H.  #3 dehydration IV fluids.  #4 coronary artery disease/chronic systolic heart failure Stable. Chest x-ray is negative. Patient looks euvolemic. Will continue beta blocker. Will hold diuretics, hold and will, hold aspirin, hold Plavix, hold hydralazine , hold Cozaar. GI to rule recommended on aspirin and Plavix may be resumed.  #5 chronic respiratory failure on home O2/COPD Stable. Patient finishes  treatment for acute COPD exacerbation. Currently stable. Continue O2. Nebulizer treatments as needed.  #6 hypertension Oral blood pressure medications secondary to problem #1 borderline blood pressure.  #7 hyperlipidemia Continue statin.  #8 elevated serum creatinine Seems chronic in nature. Currently at baseline which ranges from 1.5 to about 1.8 Follow for now.  #9 prophylaxis PPI for GI prophylaxis. SCDs for DVT prophylaxis.   Code Status: Full Family Communication: Updated patient and son and daughter at bedside Disposition Plan: Admit to SDU  Time spent: 45 mins  [facility] MD [facility] Pager [phone]

## 2013-07-12 ENCOUNTER — Encounter (HOSPITAL_COMMUNITY): Payer: Self-pay

## 2013-07-12 ENCOUNTER — Encounter (HOSPITAL_COMMUNITY)
Admission: EM | Disposition: A | Discharge status: Home / Self Care | Payer: Self-pay | Source: Home / Self Care | Attending: Internal Medicine

## 2013-07-12 DIAGNOSIS — D62 Acute posthemorrhagic anemia: Secondary | ICD-10-CM

## 2013-07-12 DIAGNOSIS — K254 Chronic or unspecified gastric ulcer with hemorrhage: Secondary | ICD-10-CM

## 2013-07-12 HISTORY — PX: ESOPHAGOGASTRODUODENOSCOPY: SHX5428

## 2013-07-12 LAB — BASIC METABOLIC PANEL
BUN: 34 mg/dL — ABNORMAL HIGH (ref 6–23)
CO2: 34 mEq/L — ABNORMAL HIGH (ref 19–32)
Calcium: 8.1 mg/dL — ABNORMAL LOW (ref 8.4–10.5)
Chloride: 102 mEq/L (ref 96–112)
GFR calc Af Amer: 46 mL/min — ABNORMAL LOW (ref >90)
GFR calc non Af Amer: 40 mL/min — ABNORMAL LOW (ref >90)
Glucose, Bld: 91 mg/dL (ref 70–99)
Sodium: 141 mEq/L (ref 135–145)

## 2013-07-12 LAB — CBC
Hemoglobin: 9.8 g/dL — ABNORMAL LOW (ref 13.0–17.0)
Hemoglobin: 10.1 g/dL — ABNORMAL LOW (ref 13.0–17.0)
MCH: 30.8 pg (ref 26.0–34.0)
MCH: 30.6 pg (ref 26.0–34.0)
MCHC: 32.5 g/dL (ref 30.0–36.0)
MCHC: 32.1 g/dL (ref 30.0–36.0)
MCV: 95 fL (ref 78.0–100.0)
Platelets: 222 10*3/uL (ref 150–400)
Platelets: 212 10*3/uL (ref 150–400)
RBC: 3.3 MIL/uL — ABNORMAL LOW (ref 4.22–5.81)
RDW: 15.9 % — ABNORMAL HIGH (ref 11.5–15.5)
RDW: 16 % — ABNORMAL HIGH (ref 11.5–15.5)

## 2013-07-12 LAB — TYPE AND SCREEN
ABO/RH(D): A NEG
Antibody Screen: NEGATIVE
Unit division: 0
Unit division: 0

## 2013-07-12 LAB — GLUCOSE, CAPILLARY: Glucose-Capillary: 68 mg/dL — ABNORMAL LOW (ref 70–99)

## 2013-07-12 SURGERY — EGD (ESOPHAGOGASTRODUODENOSCOPY)
Anesthesia: Moderate Sedation

## 2013-07-12 MED ORDER — MIDAZOLAM HCL 5 MG/ML IJ SOLN
INTRAMUSCULAR | Status: AC
  Filled 2013-07-12: qty 2

## 2013-07-12 MED ORDER — FENTANYL CITRATE 0.05 MG/ML IJ SOLN
INTRAMUSCULAR | Status: DC | PRN
  Administered 2013-07-12: 25 ug via INTRAVENOUS

## 2013-07-12 MED ORDER — SODIUM CHLORIDE 0.9 % IV SOLN: INTRAVENOUS | Status: DC

## 2013-07-12 MED ORDER — DIPHENHYDRAMINE HCL 50 MG/ML IJ SOLN
INTRAMUSCULAR | Status: AC
  Filled 2013-07-12: qty 1

## 2013-07-12 MED ORDER — FENTANYL CITRATE 0.05 MG/ML IJ SOLN
INTRAMUSCULAR | Status: AC
  Filled 2013-07-12: qty 2

## 2013-07-12 MED ORDER — MIDAZOLAM HCL 5 MG/5ML IJ SOLN
INTRAMUSCULAR | Status: DC | PRN
  Administered 2013-07-12: 1 mg via INTRAVENOUS
  Administered 2013-07-12 (×2): 2 mg via INTRAVENOUS

## 2013-07-12 MED ORDER — BUTAMBEN-TETRACAINE-BENZOCAINE 2-2-14 % EX AERO
INHALATION_SPRAY | CUTANEOUS | Status: DC | PRN
  Administered 2013-07-12: 2 via TOPICAL

## 2013-07-12 NOTE — Op Note (Signed)
[facility] [address] [place], [address]   ENDOSCOPY PROCEDURE REPORT  PATIENT: [name], [name]  MR#: [id number] BIRTHDATE: 12-May-1931 , 82  yrs. old GENDER: Male ENDOSCOPIST: Louis Meckel, MD REFERRED BY:  Jeani Hawking, M.D.  Duane Lope, M.D. PROCEDURE DATE:  07/12/2013 PROCEDURE:  EGD w/ biopsy ASA CLASS:     Class III INDICATIONS:  Occult blood positive.   Anemia secondary to chronic blood loss. MEDICATIONS: These medications were titrated to patient response per physician's verbal order, Versed 5 mg IV, and Fentanyl 25 mcg IV TOPICAL ANESTHETIC: Cetacaine Spray  DESCRIPTION OF PROCEDURE: After the risks benefits and alternatives of the procedure were thoroughly explained, informed consent was obtained.  The Pentax Gastroscope [id number] endoscope was introduced through the mouth and advanced to the third portion of the duodenum. Without limitations.  The instrument was slowly withdrawn as the mucosa was fully examined.        STOMACH: A single non-bleeding shallow and clean-based ulcer, ranging between 3-5 mm in size, with surrounding edema was found on the anterior wall of the gastric antrum.  Biopsies were taken at edge of the ulcer.  The remainder of the upper endoscopy exam was otherwise normal. Retroflexed views revealed no abnormalities.     The scope was then withdrawn from the patient and the procedure completed.  COMPLICATIONS: There were no complications. ENDOSCOPIC IMPRESSION: 1.   Single non-bleeding ulcer, ranging between 3-5 mm in size, was found on the anterior wall of the gastric antrum 2.   The remainder of the upper endoscopy exam was otherwise normal  RECOMMENDATIONS: 1.  Await biopsy results 2.  Continue PPI  REPEAT EXAM:  eSigned:  Louis Meckel, MD 07/12/2013 1:07 PM   CC:  PATIENT NAME:  [name], [name] MR#: [id number]

## 2013-07-12 NOTE — Progress Notes (Signed)
See EGD note - he has a clean-based prepyloric ulcer.  There was no fresh or old blood.  Recommendations 1.  Continue BID PPI Rx 2.  Full liquids; may advance in am if no overt bleeding

## 2013-07-12 NOTE — Progress Notes (Signed)
Progress Note   Subjective  *No nausea or vomiting. Denies chest pain.  Breathing is better.**   Objective  Vital signs in last 24 hours: Temp:  [97.6 F (36.4 C)-98.4 F (36.9 C)] 97.8 F (36.6 C) (12/25 1610) Pulse Rate:  [52-67] 52 (12/25 0822) Resp:  [14-22] 17 (12/25 0822) BP: (95-132)/(40-70) 115/55 mmHg (12/25 0822) SpO2:  [94 %-100 %] 100 % (12/25 0853) Weight:  [298 lb 1 oz (135.2 kg)-299 lb 6.2 oz (135.8 kg)] 298 lb 1 oz (135.2 kg) (12/25 0704) Last BM Date: 07/10/13 General:   Alert,  Well-developed,  white male in NAD Chest clear  Heart:  Regular rate and rhythm; no murmurs Abdomen:  Soft, nontender and nondistended. Normal bowel sounds, without guarding, and without rebound.   Extremities:  Without edema. Neurologic:  Alert and  oriented x4;  grossly normal neurologically. Psych:  Alert and cooperative. Normal mood and affect.  Intake/Output from previous day: 12/24 0701 - 12/25 0700 In: 2232.9 [I.V.:1607.9; Blood:625] Out: 3000 [Urine:3000] Intake/Output this shift: Total I/O In: 23 [I.V.:23] Out: -   Lab Results:  Recent Labs  07/11/13 1031 07/11/13 1459 07/12/13 0407  WBC 15.8* 14.6* 10.0  HGB 8.2* 7.9* 9.8*  HCT 25.9* 24.8* 30.2*  PLT 239 220 222   BMET  Recent Labs  07/11/13 1230 07/12/13 0407  NA 141 141  K 4.4 4.6  CL 104 102  CO2 33* 34*  GLUCOSE 71 91  BUN 40* 34*  CREATININE 1.50* 1.57*  CALCIUM 8.3* 8.1*   LFT No results found for this basename: PROT, ALBUMIN, AST, ALT, ALKPHOS, BILITOT, BILIDIR, IBILI,  in the last 72 hours PT/INR  Recent Labs  07/11/13 1210  LABPROT 13.2  INR 1.02   Hepatitis Panel No results found for this basename: HEPBSAG, HCVAB, HEPAIGM, HEPBIGM,  in the last 72 hours  Studies/Results: Dg Chest 2 View  07/11/2013   CLINICAL DATA:  Dyspnea and chest pain.  EXAM: CHEST  2 VIEW  COMPARISON:  July 03, 2013.  FINDINGS: The lungs are well-expanded. The interstitial markings are coarse  similar to those seen previously. Increased density just above the dome of the left hemidiaphragm is present today and may reflect atelectasis or developing infiltrate. The cardiopericardial silhouette is mildly enlarged. The patient has undergone previous CABG. The pulmonary vascularity is not engorged. There is no significant pleural effusion. The observed portions of the bony thorax exhibit no acute abnormality.  IMPRESSION: 1. There may be developing atelectasis or pneumonia at the left lung base likely posteriorly. 2. Low-grade compensated CHF is suspected little changed from the previous study. .   Electronically Signed   By: David  [place]   On: 07/11/2013 11:59   Dg Chest Portable 1 View  07/11/2013   CLINICAL DATA:  New central line placement, dyspnea  EXAM: PORTABLE CHEST - 1 VIEW  COMPARISON:  PA and lateral chest x-ray of 11 July 2013.  FINDINGS: The lungs are less well inflated on this current portable study. The patient has undergone placement of a left internal jugular venous catheter. The tip appears to lie at the junction of the right and left brachiocephalic veins. There is no evidence of a postprocedure complication. The cardiopericardial silhouette is enlarged. The central pulmonary vascularity is prominent without evidence of cephalization. There is no significant pleural effusion demonstrated. The patient has undergone previous CABG.  IMPRESSION: There is no evidence of a postprocedure complication following placement of the left internal jugular venous catheter.  Electronically Signed   By: David  [place]   On: 07/11/2013 14:39      Assessment & Plan  *Acute GI bleed in patient on aspirin and Plavix.*No further active bleeding since admission.  Cardiac and pulmonary status stable.  Plan to proceed with upper endoscopy today.  Principal Problem:   GI bleed Active Problems:   CAD, NATIVE VESSEL   Obstructive chronic bronchitis with exacerbation   Type 2 diabetes mellitus    Chronic diastolic CHF (congestive heart failure)   HTN (hypertension)   HLD (hyperlipidemia)   Acute blood loss anemia   Elevated serum creatinine   Dehydration   GIB (gastrointestinal bleeding)   Chronic respiratory failure     LOS: 1 day   Melvia Heaps  07/12/2013, 11:18 AM

## 2013-07-12 NOTE — Progress Notes (Addendum)
[facility] Progress Note [place] TEAM 1 - Stepdown/ICU TEAM   TIPTON BALLOW ZOX:[id number] DOB: 06/20/1931 DOA: 07/11/2013 PCP:  Duane Lope, MD  Admit HPI / Brief Narrative: 79 y.o. male with history of coronary artery disease status post CABG 10/04/1993, NSTEMI June 2013 status post treatment with drug-eluting stent EF of 55-60%, hypertension, hyperlipidemia, on chronic Plavix and aspirin, history of chronic respiratory failure secondary to COPD on chronic oxygen, sleep apnea, history of chronic diastolic CHF recently hospitalized from 07/02/2013 through 07/05/2013 for acute COPD exacerbation who presented to the ED from his PCPs office with abnormal labs.   Patient stated that for 6 days he had been having chest pain for which he visited his PCP, where he was found to have a Hgb of 7.8.  Patient admitted to having black stools over 6 days. Patient denied hematemesis or hematochezia. Patient denied any NSAID use.  In the emergency room labs revealed a BUN of 40 / creatinine of 1.5. CBC noted a hemoglobin of 8.2 with normal platelets.  FOBT  in the ED was positive.   HPI/Subjective: Pt is resting comfortably post EGD.  He denies abdom or chest pain. He has not had a BM since his admit. He is hungry.    Assessment/Plan:  GIB / Melena - Prepyloric ulcer  GI following - EGD revealed a clean-based prepyloric ulcer - bx pending - advance diet as per GI recs  Acute blood loss anemia Hgb was 13.5 at time of recent d/c 07/05/13 - Hgb nadir 7.9 - now s/p 2U PRBC - follow Hgb trend with goal Hgb of 8.0 or >  CAD s/p CABG x3 w/ subsequent NSTEMI >> DES x2 2013 chronic angina - well compensated at present - well over a year since DESs placed, so should be tolerated to hold ASA/Plavix for now (may not even need to resume Plavix - will discuss w/ his Cardiologist before d/c)  DM2 Well controlled at present   COPD on home O2 Well compensated at this time   Chronic diastolic CHF Well  compensated at this time   HTN No change in tx plan today   HLD  Code Status: FULL Family Communication: no family present at time of exam Disposition Plan: SDU over night   Consultants: GI  Procedures: 12/24 - CVL insertion 12/24 - EGD - Single non-bleeding ulcer, ranging between 3-5 mm in size, was found on the anterior wall of the gastric antrum  Antibiotics: none  DVT prophylaxis: SCDs  Objective: Blood pressure 110/70, pulse 61, temperature 98 F (36.7 C), temperature source Oral, resp. rate 18, height 5\' 11"  (1.803 m), weight 135.2 kg (298 lb 1 oz), SpO2 100.00%.  Intake/Output Summary (Last 24 hours) at 07/12/13 1721 Last data filed at 07/12/13 1645  Gross per 24 hour  Intake 3158.42 ml  Output   3475 ml  Net -316.58 ml   Exam: General: No acute respiratory distress Lungs: Clear to auscultation bilaterally without wheezes or crackles Cardiovascular: Regular rate and rhythm without murmur gallop or rub normal S1 and S2 Abdomen: Nontender, nondistended, soft, bowel sounds positive, no rebound, no ascites, no appreciable mass Extremities: No significant cyanosis, clubbing, or edema bilateral lower extremities  Data Reviewed: Basic Metabolic Panel:  Recent Labs Lab 07/11/13 1230 07/11/13 1900 07/12/13 0407  NA 141  --  141  K 4.4  --  4.6  CL 104  --  102  CO2 33*  --  34*  GLUCOSE 71  --  91  BUN 40*  --  34*  CREATININE 1.50*  --  1.57*  CALCIUM 8.3*  --  8.1*  MG  --  2.3  --    Liver Function Tests: No results found for this basename: AST, ALT, ALKPHOS, BILITOT, PROT, ALBUMIN,  in the last 168 hours  CBC:  Recent Labs Lab 07/11/13 1031 07/11/13 1459 07/12/13 0407 07/12/13 1340  WBC 15.8* 14.6* 10.0 11.5*  HGB 8.2* 7.9* 9.8* 10.1*  HCT 25.9* 24.8* 30.2* 31.5*  MCV 96.6 96.5 95.0 95.5  PLT 239 220 222 212   Cardiac Enzymes: No results found for this basename: CKTOTAL, CKMB, CKMBINDEX, TROPONINI,  in the last 168 hours  BNP (last 3  results)  Recent Labs  07/02/13 1140  PROBNP 185.6   CBG:  Recent Labs Lab 07/12/13 1205  GLUCAP 68*    Recent Results (from the past 240 hour(s))  MRSA PCR SCREENING     Status: None   Collection Time    07/11/13  5:30 PM      Result Value Range Status   MRSA by PCR NEGATIVE  NEGATIVE Final   Comment:            The GeneXpert MRSA Assay (FDA     approved for NASAL specimens     only), is one component of a     comprehensive MRSA colonization     surveillance program. It is not     intended to diagnose MRSA     infection nor to guide or     monitor treatment for     MRSA infections.     Studies:  Recent x-ray studies have been reviewed in detail by the Attending Physician  Scheduled Meds:  Scheduled Meds: . atorvastatin  80 mg Oral QHS  . fluticasone  2 puff Inhalation BID  . meclizine  25 mg Oral BID  . montelukast  10 mg Oral QHS  . nebivolol  10 mg Oral Daily  . [START ON 07/15/2013] pantoprazole (PROTONIX) IV  40 mg Intravenous Q12H  . sodium chloride  10 mL Intravenous Q12H  . sodium chloride  10 mL Intravenous Q12H  . sodium chloride  3 mL Intravenous Q12H  . tiotropium  18 mcg Inhalation Daily    Time spent on care of this patient: 35 mins   [facility]  [facility] Office  [phone] Pager - Text Page per Loretha Stapler as per below:  On-Call/Text Page:      [email or web address]      password [email or web address]  If 7PM-7AM, please contact night-coverage [email or web address] Password [email or web address] 07/12/2013, 5:21 PM   LOS: 1 day

## 2013-07-13 ENCOUNTER — Encounter (HOSPITAL_COMMUNITY): Payer: Self-pay | Admitting: Gastroenterology

## 2013-07-13 LAB — BASIC METABOLIC PANEL
BUN: 25 mg/dL — ABNORMAL HIGH (ref 6–23)
CO2: 34 mEq/L — ABNORMAL HIGH (ref 19–32)
GFR calc non Af Amer: 46 mL/min — ABNORMAL LOW (ref >90)
Glucose, Bld: 89 mg/dL (ref 70–99)
Potassium: 4 mEq/L (ref 3.5–5.1)
Sodium: 138 mEq/L (ref 135–145)

## 2013-07-13 LAB — CBC
HCT: 31.5 % — ABNORMAL LOW (ref 39.0–52.0)
Hemoglobin: 10.2 g/dL — ABNORMAL LOW (ref 13.0–17.0)
MCHC: 32.4 g/dL (ref 30.0–36.0)
RBC: 3.32 MIL/uL — ABNORMAL LOW (ref 4.22–5.81)
RDW: 15.2 % (ref 11.5–15.5)

## 2013-07-13 MED ORDER — ASPIRIN 325 MG PO TABS: 325.0000 mg | ORAL_TABLET | Freq: Every day | ORAL | Status: DC

## 2013-07-13 MED ORDER — CLOPIDOGREL BISULFATE 75 MG PO TABS: 75.0000 mg | ORAL_TABLET | Freq: Every day | ORAL | Status: DC

## 2013-07-13 MED ORDER — OMEPRAZOLE 20 MG PO CPDR: 40.0000 mg | DELAYED_RELEASE_CAPSULE | Freq: Two times a day (BID) | ORAL | Status: DC

## 2013-07-13 MED ORDER — OMEPRAZOLE 20 MG PO CPDR: 20.0000 mg | DELAYED_RELEASE_CAPSULE | Freq: Two times a day (BID) | ORAL | Status: DC

## 2013-07-13 NOTE — Progress Notes (Signed)
Subjective: Feeling much better.  Objective: Vital signs in last 24 hours: Temp:  [97.2 F (36.2 C)-98 F (36.7 C)] 97.2 F (36.2 C) (12/26 0732) Pulse Rate:  [52-77] 66 (12/26 0732) Resp:  [7-20] 17 (12/26 0732) BP: (100-145)/(49-84) 138/61 mmHg (12/26 0732) SpO2:  [85 %-100 %] 100 % (12/26 0732) Weight:  [297 lb 6.4 oz (134.9 kg)] 297 lb 6.4 oz (134.9 kg) (12/26 0445) Last BM Date: 07/10/13  Intake/Output from previous day: 12/25 0701 - 12/26 0700 In: 2345.5 [P.O.:120; I.V.:2225.5] Out: 1600 [Urine:1600] Intake/Output this shift: Total I/O In: -  Out: 500 [Urine:500]  General appearance: alert and no distress GI: soft, non-tender; bowel sounds normal; no masses,  no organomegaly  Lab Results:  Recent Labs  07/12/13 0407 07/12/13 1340 07/13/13 0345  WBC 10.0 11.5* 9.5  HGB 9.8* 10.1* 10.2*  HCT 30.2* 31.5* 31.5*  PLT 222 212 204   BMET  Recent Labs  07/11/13 1230 07/12/13 0407 07/13/13 0345  NA 141 141 138  K 4.4 4.6 4.0  CL 104 102 100  CO2 33* 34* 34*  GLUCOSE 71 91 89  BUN 40* 34* 25*  CREATININE 1.50* 1.57* 1.40*  CALCIUM 8.3* 8.1* 8.0*   LFT No results found for this basename: PROT, ALBUMIN, AST, ALT, ALKPHOS, BILITOT, BILIDIR, IBILI,  in the last 72 hours PT/INR  Recent Labs  07/11/13 1210  LABPROT 13.2  INR 1.02   Hepatitis Panel No results found for this basename: HEPBSAG, HCVAB, HEPAIGM, HEPBIGM,  in the last 72 hours C-Diff No results found for this basename: CDIFFTOX,  in the last 72 hours Fecal Lactopherrin No results found for this basename: FECLLACTOFRN,  in the last 72 hours  Studies/Results: Dg Chest 2 View  07/11/2013   CLINICAL DATA:  Dyspnea and chest pain.  EXAM: CHEST  2 VIEW  COMPARISON:  July 03, 2013.  FINDINGS: The lungs are well-expanded. The interstitial markings are coarse similar to those seen previously. Increased density just above the dome of the left hemidiaphragm is present today and may reflect  atelectasis or developing infiltrate. The cardiopericardial silhouette is mildly enlarged. The patient has undergone previous CABG. The pulmonary vascularity is not engorged. There is no significant pleural effusion. The observed portions of the bony thorax exhibit no acute abnormality.  IMPRESSION: 1. There may be developing atelectasis or pneumonia at the left lung base likely posteriorly. 2. Low-grade compensated CHF is suspected little changed from the previous study. .   Electronically Signed   By: David  [place]   On: 07/11/2013 11:59   Dg Chest Portable 1 View  07/11/2013   CLINICAL DATA:  New central line placement, dyspnea  EXAM: PORTABLE CHEST - 1 VIEW  COMPARISON:  PA and lateral chest x-ray of 11 July 2013.  FINDINGS: The lungs are less well inflated on this current portable study. The patient has undergone placement of a left internal jugular venous catheter. The tip appears to lie at the junction of the right and left brachiocephalic veins. There is no evidence of a postprocedure complication. The cardiopericardial silhouette is enlarged. The central pulmonary vascularity is prominent without evidence of cephalization. There is no significant pleural effusion demonstrated. The patient has undergone previous CABG.  IMPRESSION: There is no evidence of a postprocedure complication following placement of the left internal jugular venous catheter.   Electronically Signed   By: David  [place]   On: 07/11/2013 14:39    Medications:  Scheduled: . atorvastatin  80 mg Oral QHS  .  fluticasone  2 puff Inhalation BID  . meclizine  25 mg Oral BID  . montelukast  10 mg Oral QHS  . nebivolol  10 mg Oral Daily  . [START ON 07/15/2013] pantoprazole (PROTONIX) IV  40 mg Intravenous Q12H  . tiotropium  18 mcg Inhalation Daily   Continuous: . sodium chloride 75 mL/hr at 07/13/13 0337  . pantoprozole (PROTONIX) infusion 8 mg/hr (07/13/13 2130)    Assessment/Plan: 1) Gastric antral ulcer.   He is  much better today.  The ulcer identified was a clean-based antral ulcer.    Plan: 1) PPI BID x 1 month then QD indefinitely. 2) Okay to D/C Home. 3) Signing off.   LOS: 2 days   Shaleah Nissley D 07/13/2013, 8:12 AM

## 2013-07-13 NOTE — Evaluation (Signed)
Physical Therapy Evaluation Patient Details Name: [name] MRN: [id number] DOB: 12-08-1930 Today's Date: 07/13/2013 Time: 4098-1191 PT Time Calculation (min): 28 min  PT Assessment / Plan / Recommendation History of Present Illness  79 yo male admitted with acute on chronic resp failure. Hx of asthma, syncope, orthopnea, COPD  Clinical Impression  Pt admitted with above. Pt currently with functional limitations due to the deficits listed below (see PT Problem List). Will need HHPT and HHOT.  Pt will benefit from skilled PT to increase their independence and safety with mobility to allow discharge to the venue listed below.     PT Assessment  Patient needs continued PT services    Follow Up Recommendations  Home health PT (Home Health OT)                Equipment Recommendations  None recommended by PT    Recommendations for Other Services OT consult   Frequency Min 3X/week    Precautions / Restrictions Precautions Precautions: Fall Precaution Comments: O2 dep Restrictions Weight Bearing Restrictions: No   Pertinent Vitals/Pain O2 sats on 2LO2 with ambulation 88% at end of walk.  96% and > at rest.  Other VSS, no pain.      Mobility  Bed Mobility Bed Mobility: Not assessed Details for Bed Mobility Assistance: pt sitting on 3N1.   Transfers Transfers: Sit to Stand;Stand to Sit Sit to Stand: 4: Min guard;Without upper extremity assist;From toilet Stand to Sit: 4: Min guard;Without upper extremity assist;To toilet Details for Transfer Assistance: more unsteady without UE assist to rise and descent to toilet, needing min guard assist. With 3in1 armrests pt was safer and supervision.  Ambulation/Gait Ambulation/Gait Assistance: 4: Min assist Ambulation Distance (Feet): 100 Feet Assistive device: Rolling walker Ambulation/Gait Assistance Details: Pt needs RW for stability.  Poor safety without RW.  Shaky at times.  Needed 2LO2 to ambulate with desat to 88% at end  of walk.   Gait Pattern: Step-through pattern Stairs: No Wheelchair Mobility Wheelchair Mobility: No    Exercises General Exercises - Lower Extremity Ankle Circles/Pumps: AROM;Both;10 reps;Seated Long Arc Quad: AROM;Both;10 reps;Seated   PT Diagnosis: Generalized weakness;Difficulty walking  PT Problem List: Decreased activity tolerance;Decreased balance;Decreased mobility;Obesity PT Treatment Interventions: Gait training;DME instruction;Functional mobility training;Therapeutic activities;Therapeutic exercise;Patient/family education;Balance training     PT Goals(Current goals can be found in the care plan section) Acute Rehab PT Goals Patient Stated Goal: home PT Goal Formulation: With patient Time For Goal Achievement: 07/20/13 Potential to Achieve Goals: Good  Visit Information  Last PT Received On: 07/13/13 Assistance Needed: +1 History of Present Illness: 79 yo male admitted with acute on chronic resp failure. Hx of asthma, syncope, orthopnea, COPD       Prior Functioning  Home Living Family/patient expects to be discharged to:: Private residence Living Arrangements: Children Available Help at Discharge: Family Type of Home: Mobile home Home Access: Stairs to enter Entrance Stairs-Number of Steps: 2 Entrance Stairs-Rails: None Home Layout: One level Home Equipment: Environmental consultant - 2 wheels;Cane - single point;Bedside commode (family to bring shower chair) Prior Function Level of Independence: Needs assistance ADL's / Homemaking Assistance Needed: son takes care of cooking, cleaning Communication Communication: No difficulties    Cognition  Cognition Arousal/Alertness: Awake/alert Behavior During Therapy: WFL for tasks assessed/performed Overall Cognitive Status: Within Functional Limits for tasks assessed    Extremity/Trunk Assessment Upper Extremity Assessment Upper Extremity Assessment: Defer to OT evaluation Lower Extremity Assessment Lower Extremity  Assessment: Generalized weakness Cervical / Trunk Assessment  Cervical / Trunk Assessment: Normal   Balance Dynamic Standing Balance Dynamic Standing - Level of Assistance: 5: Stand by assistance  End of Session PT - End of Session Equipment Utilized During Treatment: Oxygen;Gait belt Activity Tolerance: Patient tolerated treatment well Patient left: in chair;with call bell/phone within reach Nurse Communication: Mobility status       [name],[name] 07/13/2013, 11:12 AM [facility] [phone] (pager)

## 2013-07-13 NOTE — Discharge Summary (Addendum)
DISCHARGE SUMMARY  [name]  MR#: [id number]  DOB:Jul 16, 1931  Date of Admission: 07/11/2013 Date of Discharge: 07/13/2013  Attending Physician:MCCLUNG,JEFFREY T  Patient's PCP: Duane Lope, MD  Consults:  Theda Belfast, MD - GI  Disposition: D/C home   Follow-up Appts:     Follow-up Information   Follow up with  Duane Lope, MD. Schedule an appointment as soon as possible for a visit in 5 days.   Specialty:  Family Medicine   Contact information:   [address] [place] [address] [phone]       Follow up with Valera Castle, MD. Schedule an appointment as soon as possible for a visit in 10 days. (make an appointment for a f/u to discuss potentially stopping your Plavix tx )    Specialty:  Cardiology   Contact information:   [address] [place] [address] [phone]       Call Theda Belfast, MD. (As needed)    Specialty:  Gastroenterology   Contact information:   [address] [place] [address] [phone]      Tests Needing Follow-up: CBC is suggested to assure Hgb is stable - f/u of endoscopic bx results is suggested   Discharge Diagnoses: GIB / Melena - Prepyloric ulcer   Acute blood loss anemia  CAD s/p CABG x3 w/ subsequent NSTEMI >> DES x2 2013  DM2  COPD on home O2   Chronic diastolic CHF  HTN  HLD   Initial presentation: 79 y.o. male with history of coronary artery disease status post CABG 10/04/1993, NSTEMI June 2013 status post treatment with drug-eluting stent EF of 55-60%, hypertension, hyperlipidemia, on chronic Plavix and aspirin, history of chronic respiratory failure secondary to COPD on chronic oxygen, sleep apnea, history of chronic diastolic CHF recently hospitalized from 07/02/2013 through 07/05/2013 for acute COPD exacerbation who presented to the ED from his PCPs office with abnormal labs.   Patient stated that for 6 days he had been having chest pain for which he visited his PCP, where  he was found to have a Hgb of 7.8. Patient admitted to having black stools over 6 days. Patient denied hematemesis or hematochezia. Patient denied any NSAID use.   In the emergency room labs revealed a BUN of 40 / creatinine of 1.5. CBC noted a hemoglobin of 8.2 with normal platelets. FOBT in the ED was positive.   Hospital Course:  GIB / Melena - Prepyloric ulcer  GI followed in consultation - EGD revealed a clean-based prepyloric ulcer - bx pending at time of d/c - no melena, hematemesis, or orthostasis at time of d/c - pt ambulating in hallway w/o difficulty - pt instruced to not resume ASA or Plavix until 07/20/2013  Acute blood loss anemia  Hgb was 13.5 at time of recent d/c 07/05/13 - Hgb nadir 7.9 - s/p 2U PRBC this admit - Hgb stable/improving at time of d/c at 10.2  CAD s/p CABG x3 w/ subsequent NSTEMI >> DES x2 2013  chronic angina - well compensated during this hospitalization - well over a year since DESs placed, so should be tolerated to hold ASA/Plavix for one week before resuming - suggest f/u w/ his Cardiologist as an outpt to consider if he still needs long term Plavix  DM2  Well controlled   COPD on home O2  Well compensated   Chronic diastolic CHF  Well compensated    HTN  No change in tx plan today   HLD  Medication List         albuterol 108 (90 BASE) MCG/ACT inhaler  Commonly known as:  PROVENTIL HFA;VENTOLIN HFA  Inhale 2 puffs into the lungs every 6 (six) hours as needed for wheezing or shortness of breath.     albuterol (2.5 MG/3ML) 0.083% nebulizer solution  Commonly known as:  PROVENTIL  Take 3 mLs (2.5 mg total) by nebulization every 6 (six) hours as needed for wheezing.     ARTIFICIAL TEAR OP  Place 2 drops into both eyes daily as needed. For dry eyes     aspirin 325 MG tablet  Take 1 tablet (325 mg total) by mouth daily.  Start taking on:  07/20/2013     atorvastatin 80 MG tablet  Commonly known as:  LIPITOR  Take 80 mg by mouth at  bedtime.     azithromycin 500 MG tablet  Commonly known as:  ZITHROMAX  Take 1 tablet (500 mg total) by mouth daily. For 3 more days     BONINE 25 MG Chew  Generic drug:  Meclizine HCl  Chew 1 tablet by mouth 2 (two) times daily.     cefpodoxime 200 MG tablet  Commonly known as:  VANTIN  Take 1 tablet (200 mg total) by mouth every 12 (twelve) hours. For 4 more days     clopidogrel 75 MG tablet  Commonly known as:  PLAVIX  Take 1 tablet (75 mg total) by mouth daily with breakfast.  Start taking on:  07/20/2013     fluticasone 110 MCG/ACT inhaler  Commonly known as:  FLOVENT HFA  Inhale 2 puffs into the lungs 2 (two) times daily.     furosemide 40 MG tablet  Commonly known as:  LASIX  Take 1 tablet (40 mg total) by mouth daily.     hydrALAZINE 25 MG tablet  Commonly known as:  APRESOLINE  Take 25 mg by mouth 3 (three) times daily.     isosorbide mononitrate 30 MG 24 hr tablet  Commonly known as:  IMDUR  Take 30 mg by mouth daily.     losartan 100 MG tablet  Commonly known as:  COZAAR  Take 100 mg by mouth daily.     montelukast 10 MG tablet  Commonly known as:  SINGULAIR  Take 10 mg by mouth at bedtime.     MULTIVITAMIN & MINERAL PO  Take 1 tablet by mouth daily.     nebivolol 10 MG tablet  Commonly known as:  BYSTOLIC  Take 1 tablet (10 mg total) by mouth daily.     nitroGLYCERIN 0.4 MG SL tablet  Commonly known as:  NITROSTAT  Place 0.4 mg under the tongue every 5 (five) minutes as needed for chest pain.     NU-IRON 150 MG capsule  Generic drug:  iron polysaccharides  Take 150 mg by mouth 2 (two) times daily.     omeprazole 20 MG capsule  Commonly known as:  PRILOSEC  Take 2 capsules (40 mg total) by mouth 2 (two) times daily before a meal.     potassium chloride SA 20 MEQ tablet  Commonly known as:  K-DUR,KLOR-CON  Take 20 mEq by mouth.     tiotropium 18 MCG inhalation capsule  Commonly known as:  SPIRIVA  Place 1 capsule (18 mcg total) into inhaler  and inhale daily.        Day of Discharge BP 138/61  Pulse 66  Temp(Src) 97.2 F (36.2 C) (Oral)  Resp 17  Ht  5\' 11"  (1.803 m)  Wt 134.9 kg (297 lb 6.4 oz)  BMI 41.50 kg/m2  SpO2 100%  Physical Exam: General: No acute respiratory distress Lungs: Clear to auscultation bilaterally without wheezes or crackles Cardiovascular: Regular rate and rhythm without murmur gallop or rub normal S1 and S2 Abdomen: Nontender, nondistended, soft, bowel sounds positive, no rebound, no ascites, no appreciable mass Extremities: No significant cyanosis, clubbing, or edema bilateral lower extremities  Results for orders placed during the hospital encounter of 07/11/13 (from the past 24 hour(s))  GLUCOSE, CAPILLARY     Status: Abnormal   Collection Time    07/12/13 12:05 PM      Result Value Range   Glucose-Capillary 68 (*) 70 - 99 mg/dL  CBC     Status: Abnormal   Collection Time    07/12/13  1:40 PM      Result Value Range   WBC 11.5 (*) 4.0 - 10.5 K/uL   RBC 3.30 (*) 4.22 - 5.81 MIL/uL   Hemoglobin 10.1 (*) 13.0 - 17.0 g/dL   HCT 82.9 (*) 56.2 - 13.0 %   MCV 95.5  78.0 - 100.0 fL   MCH 30.6  26.0 - 34.0 pg   MCHC 32.1  30.0 - 36.0 g/dL   RDW 86.5 (*) 78.4 - 69.6 %   Platelets 212  150 - 400 K/uL  CBC     Status: Abnormal   Collection Time    07/13/13  3:45 AM      Result Value Range   WBC 9.5  4.0 - 10.5 K/uL   RBC 3.32 (*) 4.22 - 5.81 MIL/uL   Hemoglobin 10.2 (*) 13.0 - 17.0 g/dL   HCT 29.5 (*) 28.4 - 13.2 %   MCV 94.9  78.0 - 100.0 fL   MCH 30.7  26.0 - 34.0 pg   MCHC 32.4  30.0 - 36.0 g/dL   RDW 44.0  10.2 - 72.5 %   Platelets 204  150 - 400 K/uL  BASIC METABOLIC PANEL     Status: Abnormal   Collection Time    07/13/13  3:45 AM      Result Value Range   Sodium 138  135 - 145 mEq/L   Potassium 4.0  3.5 - 5.1 mEq/L   Chloride 100  96 - 112 mEq/L   CO2 34 (*) 19 - 32 mEq/L   Glucose, Bld 89  70 - 99 mg/dL   BUN 25 (*) 6 - 23 mg/dL   Creatinine, Ser 3.66 (*) 0.50 - 1.35  mg/dL   Calcium 8.0 (*) 8.4 - 10.5 mg/dL   GFR calc non Af Amer 46 (*) >90 mL/min   GFR calc Af Amer 53 (*) >90 mL/min    Time spent in discharge (includes decision making & examination of pt): 30 minutes  07/13/2013, 12:04 PM   Lonia Blood, MD [facility] Office  [phone] Pager [phone]  On-Call/Text Page:      [email or web address]      password [facility]

## 2013-07-13 NOTE — Progress Notes (Signed)
Utilization review completed. Kynisha Memon, RN, BSN. 

## 2013-07-13 NOTE — Evaluation (Signed)
Occupational Therapy Evaluation Patient Details Name: AQUAN KOPE MRN: [id number] DOB: [birth year] Today's Date: 07/13/2013 Time: 4098-1191 OT Time Calculation (min): 21 min  OT Assessment / Plan / Recommendation History of present illness 79 yo male admitted with acute on chronic resp failure. Hx of asthma, syncope, orthopnea, COPD   Clinical Impression   Patient evaluated by Occupational Therapy with no further acute OT needs identified. All education has been completed and the patient has no further questions. Pt requires min guard assist with BADLs.  Recommend HHOTSee below for any follow-up Occupational Therapy or equipment needs. OT is signing off. Thank you for this referral.     OT Assessment  All further OT needs can be met in the next venue of care    Follow Up Recommendations  Home health OT;Supervision/Assistance - 24 hour    Barriers to Discharge      Equipment Recommendations  None recommended by OT    Recommendations for Other Services    Frequency       Precautions / Restrictions Precautions Precautions: Fall Precaution Comments: O2 dep Restrictions Weight Bearing Restrictions: No   Pertinent Vitals/Pain     ADL  Eating/Feeding: Independent Where Assessed - Eating/Feeding: Chair Grooming: Wash/dry hands;Wash/dry face;Teeth care;Brushing hair;Min guard Where Assessed - Grooming: Supported standing Upper Body Bathing: Set up Where Assessed - Upper Body Bathing: Unsupported sitting Lower Body Bathing: Min guard Where Assessed - Lower Body Bathing: Supported sit to stand Upper Body Dressing: Supervision/safety;Set up Where Assessed - Upper Body Dressing: Unsupported sitting Lower Body Dressing: Min guard Where Assessed - Lower Body Dressing: Supported sit to stand Toilet Transfer: Hydrographic surveyor Method: Sit to stand;Stand pivot Acupuncturist: Comfort height toilet Toileting - Architect and Hygiene: Min guard Where  Assessed - Engineer, mining and Hygiene: Standing Equipment Used: Rolling walker Transfers/Ambulation Related to ADLs: min guard ADL Comments: Pt with dyspnea 3/4 with BADLs.  Pt with urinary urgency.  Spoke with pt re: energy conservation techniques and need to pace self, and take rest breaks as he fatigues.  Also, instructed him to take urinal home with him for urgency.   Pt reports son is planning to buy a shower seat     OT Diagnosis: Generalized weakness  OT Problem List: Decreased strength;Decreased activity tolerance;Impaired balance (sitting and/or standing);Decreased knowledge of use of DME or AE;Cardiopulmonary status limiting activity;Obesity OT Treatment Interventions: Self-care/ADL training;Energy conservation;DME and/or AE instruction;Therapeutic activities;Patient/family education;Balance training   OT Goals(Current goals can be found in the care plan section) Acute Rehab OT Goals Patient Stated Goal: home  Visit Information  Last OT Received On: 07/13/13 Assistance Needed: +1 History of Present Illness: 79 yo male admitted with acute on chronic resp failure. Hx of asthma, syncope, orthopnea, COPD       Prior Functioning     Home Living Family/patient expects to be discharged to:: Private residence Living Arrangements: Children Available Help at Discharge: Family Type of Home: Mobile home Home Access: Stairs to enter Entrance Stairs-Number of Steps: 2 Entrance Stairs-Rails: None Home Layout: One level Home Equipment: Environmental consultant - 2 wheels;Cane - single point;Bedside commode (family to bring shower chair) Prior Function Level of Independence: Needs assistance ADL's / Homemaking Assistance Needed: son takes care of cooking, cleaning Communication Communication: No difficulties         Vision/Perception     Cognition  Cognition Arousal/Alertness: Awake/alert Behavior During Therapy: WFL for tasks assessed/performed Overall Cognitive Status:  Within Functional Limits for tasks assessed  Extremity/Trunk Assessment Upper Extremity Assessment Upper Extremity Assessment: Defer to OT evaluation Lower Extremity Assessment Lower Extremity Assessment: Defer to PT evaluation Cervical / Trunk Assessment Cervical / Trunk Assessment: Normal     Mobility Bed Mobility Bed Mobility: Not assessed Details for Bed Mobility Assistance: pt sitting on 3N1.   Transfers Transfers: Sit to Stand;Stand to Sit Sit to Stand: 4: Min guard;Without upper extremity assist;From toilet Stand to Sit: 4: Min guard;Without upper extremity assist;To toilet Details for Transfer Assistance: Pt attempted to stand urgently to urinate, noted to be unsteady.  Recommend urinal at home      Exercise General Exercises - Lower Extremity Ankle Circles/Pumps: AROM;Both;10 reps;Seated Long Arc Quad: AROM;Both;10 reps;Seated   Balance Dynamic Standing Balance Dynamic Standing - Level of Assistance: 5: Stand by assistance   End of Session OT - End of Session Equipment Utilized During Treatment: Oxygen;Rolling walker Activity Tolerance: Patient tolerated treatment well Patient left: in chair;with call bell/phone within reach Nurse Communication: Mobility status  GO     Alexea Blase, Ursula Alert M 07/13/2013, 2:23 PM

## 2013-07-21 ENCOUNTER — Other Ambulatory Visit: Payer: Self-pay | Admitting: Cardiology

## 2013-07-23 ENCOUNTER — Encounter: Payer: Self-pay | Admitting: Gastroenterology

## 2013-07-26 ENCOUNTER — Emergency Department (HOSPITAL_COMMUNITY)
Admission: EM | Admit: 2013-07-26 | Discharge: 2013-07-27 | Disposition: A | Discharge status: Home / Self Care | Payer: Medicare Other | Attending: Emergency Medicine | Admitting: Emergency Medicine

## 2013-07-26 ENCOUNTER — Encounter (HOSPITAL_COMMUNITY): Payer: Self-pay | Admitting: Emergency Medicine

## 2013-07-26 DIAGNOSIS — Z7982 Long term (current) use of aspirin: Secondary | ICD-10-CM | POA: Insufficient documentation

## 2013-07-26 DIAGNOSIS — Z951 Presence of aortocoronary bypass graft: Secondary | ICD-10-CM | POA: Insufficient documentation

## 2013-07-26 DIAGNOSIS — M19049 Primary osteoarthritis, unspecified hand: Secondary | ICD-10-CM | POA: Insufficient documentation

## 2013-07-26 DIAGNOSIS — Z7902 Long term (current) use of antithrombotics/antiplatelets: Secondary | ICD-10-CM | POA: Insufficient documentation

## 2013-07-26 DIAGNOSIS — K219 Gastro-esophageal reflux disease without esophagitis: Secondary | ICD-10-CM | POA: Insufficient documentation

## 2013-07-26 DIAGNOSIS — E039 Hypothyroidism, unspecified: Secondary | ICD-10-CM | POA: Insufficient documentation

## 2013-07-26 DIAGNOSIS — Z9981 Dependence on supplemental oxygen: Secondary | ICD-10-CM | POA: Insufficient documentation

## 2013-07-26 DIAGNOSIS — Z8669 Personal history of other diseases of the nervous system and sense organs: Secondary | ICD-10-CM | POA: Insufficient documentation

## 2013-07-26 DIAGNOSIS — I252 Old myocardial infarction: Secondary | ICD-10-CM | POA: Insufficient documentation

## 2013-07-26 DIAGNOSIS — Z79899 Other long term (current) drug therapy: Secondary | ICD-10-CM | POA: Insufficient documentation

## 2013-07-26 DIAGNOSIS — IMO0002 Reserved for concepts with insufficient information to code with codable children: Secondary | ICD-10-CM | POA: Insufficient documentation

## 2013-07-26 DIAGNOSIS — I5032 Chronic diastolic (congestive) heart failure: Secondary | ICD-10-CM | POA: Insufficient documentation

## 2013-07-26 DIAGNOSIS — J449 Chronic obstructive pulmonary disease, unspecified: Secondary | ICD-10-CM | POA: Insufficient documentation

## 2013-07-26 DIAGNOSIS — I2510 Atherosclerotic heart disease of native coronary artery without angina pectoris: Secondary | ICD-10-CM | POA: Insufficient documentation

## 2013-07-26 DIAGNOSIS — D649 Anemia, unspecified: Secondary | ICD-10-CM | POA: Insufficient documentation

## 2013-07-26 DIAGNOSIS — Z9861 Coronary angioplasty status: Secondary | ICD-10-CM | POA: Insufficient documentation

## 2013-07-26 DIAGNOSIS — Z8701 Personal history of pneumonia (recurrent): Secondary | ICD-10-CM | POA: Insufficient documentation

## 2013-07-26 DIAGNOSIS — E669 Obesity, unspecified: Secondary | ICD-10-CM | POA: Insufficient documentation

## 2013-07-26 DIAGNOSIS — J4489 Other specified chronic obstructive pulmonary disease: Secondary | ICD-10-CM | POA: Insufficient documentation

## 2013-07-26 DIAGNOSIS — M1710 Unilateral primary osteoarthritis, unspecified knee: Secondary | ICD-10-CM | POA: Insufficient documentation

## 2013-07-26 DIAGNOSIS — I10 Essential (primary) hypertension: Secondary | ICD-10-CM | POA: Insufficient documentation

## 2013-07-26 DIAGNOSIS — G4730 Sleep apnea, unspecified: Secondary | ICD-10-CM | POA: Insufficient documentation

## 2013-07-26 DIAGNOSIS — E785 Hyperlipidemia, unspecified: Secondary | ICD-10-CM | POA: Insufficient documentation

## 2013-07-26 DIAGNOSIS — Z792 Long term (current) use of antibiotics: Secondary | ICD-10-CM | POA: Insufficient documentation

## 2013-07-26 LAB — CBC WITH DIFFERENTIAL/PLATELET
BASOS ABS: 0 10*3/uL (ref 0.0–0.1)
Basophils Relative: 0 % (ref 0–1)
Eosinophils Absolute: 0.4 10*3/uL (ref 0.0–0.7)
Eosinophils Relative: 5 % (ref 0–5)
HCT: 31.2 % — ABNORMAL LOW (ref 39.0–52.0)
Hemoglobin: 9.7 g/dL — ABNORMAL LOW (ref 13.0–17.0)
LYMPHS PCT: 32 % (ref 12–46)
Lymphs Abs: 2.3 10*3/uL (ref 0.7–4.0)
MCH: 29 pg (ref 26.0–34.0)
MCHC: 31.1 g/dL (ref 30.0–36.0)
MCV: 93.4 fL (ref 78.0–100.0)
Monocytes Absolute: 0.9 10*3/uL (ref 0.1–1.0)
Monocytes Relative: 12 % (ref 3–12)
NEUTROS ABS: 3.6 10*3/uL (ref 1.7–7.7)
Neutrophils Relative %: 51 % (ref 43–77)
PLATELETS: 195 10*3/uL (ref 150–400)
RBC: 3.34 MIL/uL — ABNORMAL LOW (ref 4.22–5.81)
RDW: 14.5 % (ref 11.5–15.5)
WBC: 7.1 10*3/uL (ref 4.0–10.5)

## 2013-07-26 LAB — COMPREHENSIVE METABOLIC PANEL
ALT: 18 U/L (ref 0–53)
AST: 20 U/L (ref 0–37)
Albumin: 3.5 g/dL (ref 3.5–5.2)
Alkaline Phosphatase: 82 U/L (ref 39–117)
BILIRUBIN TOTAL: 0.4 mg/dL (ref 0.3–1.2)
BUN: 21 mg/dL (ref 6–23)
CALCIUM: 8.8 mg/dL (ref 8.4–10.5)
CHLORIDE: 99 meq/L (ref 96–112)
CO2: 34 meq/L — AB (ref 19–32)
Creatinine, Ser: 1.62 mg/dL — ABNORMAL HIGH (ref 0.50–1.35)
GFR calc Af Amer: 45 mL/min — ABNORMAL LOW (ref >90)
GFR, EST NON AFRICAN AMERICAN: 38 mL/min — AB (ref >90)
Glucose, Bld: 102 mg/dL — ABNORMAL HIGH (ref 70–99)
Potassium: 4.3 mEq/L (ref 3.7–5.3)
SODIUM: 145 meq/L (ref 137–147)
Total Protein: 6.8 g/dL (ref 6.0–8.3)

## 2013-07-26 LAB — OCCULT BLOOD, POC DEVICE: Fecal Occult Bld: NEGATIVE

## 2013-07-26 LAB — PROTIME-INR
INR: 0.95 (ref 0.00–1.49)
Prothrombin Time: 12.5 seconds (ref 11.6–15.2)

## 2013-07-26 NOTE — ED Notes (Addendum)
Pt. received a call from his PCP this evening advising him to go to the ER due low blood test results taken this morning at his clinic  , pt. unable to recall the reported results , son suspects low hemoglobin  , pt. stated history of PUD/GI bleed with blood transfusions , denies bloody stools .

## 2013-07-26 NOTE — ED Provider Notes (Signed)
I saw and evaluated the patient, reviewed the resident's note and I agree with the findings and plan.  EKG Interpretation    Date/Time:  Thursday July 26 2013 22:21:32 EST Ventricular Rate:  59 PR Interval:  61 QRS Duration: 103 QT Interval:  426 QTC Calculation: 422 R Axis:   -5 Text Interpretation:  Normal sinus rhythm Supraventricular bigeminy Low voltage, precordial leads No significant change since last tracing Confirmed by Chaeli Judy  MD, Saabir Blyth ([id number]) on 07/26/2013 11:28:08 PM           Patient has no acute complaints of at this time and is here only at the encouragement of his physician due to his low hemoglobin. Patient's evaluation has been negative for acute GI hemorrhage. He will followup with his Dr.  Leota Jacobsen, MD 07/26/13 2351

## 2013-07-26 NOTE — ED Provider Notes (Signed)
CSN: [id number]     Arrival date & time 07/26/13  1908 History   First MD Initiated Contact with Patient 07/26/13 2221     Chief Complaint  Patient presents with  . Anemia   HPI  [name] is a 80 y/o male with history as noted below who presents with cc of reported low hemoglobin. The patient was recently admitted for a GIB. Required transfusion while in the hospital. Has been doing well since being discharged. Went to his PCP today for routine follow up and labs. He was called and told his hemoglobin was low and it was recommended he come to the emergency room for evaluation. He denies any further episodes of bleeding. He denies any hematemesis, hematochezia or melena. He denies any fevers, chills, vomiting, abdominal pain, chest pain or shortness of breath.   Past Medical History  Diagnosis Date  . CAD (coronary artery disease)     a. s/p CABG x 3 in 1995;  b.Lexi MV 10/2011: NL EF, No isch/infarct;;  d.  NSTEMI 12/2011 -> Cath 6/21: LM 20-30, LAD 90, RI 95, LCX 66m, RCA 90/115m (treated w/ 2.0x16 Promus DES), VG->RCA ok, VG->RI 70-70m (treated w/ 4.0x28 Promus), LIMA->LAD ok, EF 55-60%.  Marland Kitchen HTN (hypertension)   . HLD (hyperlipidemia)   . Obesity   . Syncope   . COPD (chronic obstructive pulmonary disease)   . Hypoxia   . HOH (hard of hearing)     left ear  . Asthma   . Sleep apnea   . Chronic diastolic CHF (congestive heart failure)     a. Echo 10/2011: EF 55-60%;  b. elevated EDP req diuresis.  . Chronic respiratory failure 07/11/2013  . Family history of anesthesia complication     "little brother got put to sleep & they liked to never get him back" (07/11/2013)  . Pneumonia 1990's  . Dyspnea     "all the time" (07/11/2013)  . Orthopnea     a. Has been sleeping in a recliner x 30 yrs  . On home oxygen therapy     "3L; 24/7" (07/11/2013)  . Hypothyroidism   . GERD (gastroesophageal reflux disease)   . Arthritis     "knees; hands" (07/11/2013)  . Dysrhythmia     "skips a  beat q now & then; had some kind of heart OR  when I was in the [place]" (07/11/2013)   Past Surgical History  Procedure Laterality Date  . Functional endoscopic sinus surgery  2006  . Hydrocele excision  2005  . Prostate surgery  1998  . Excisional hemorrhoidectomy      "twice cut them out; burnt them out once"  . Coronary angioplasty with stent placement  12/2011  . Coronary artery bypass graft  1995    CABG X3  . Inguinal hernia repair Right 2006  . Knee arthroscopy Right 1990's  . Back surgery  1952    "when I was in the [place]" (07/11/2013)  . Esophagogastroduodenoscopy N/A 07/12/2013    Procedure: ESOPHAGOGASTRODUODENOSCOPY (EGD);  Surgeon: Inda Castle, MD;  Location: [facility];  Service: Endoscopy;  Laterality: N/A;   No family history on file. History  Substance Use Topics  . Smoking status: Never Smoker   . Smokeless tobacco: Never Used  . Alcohol Use: Yes     Comment: 07/11/2013  "used to drink a little; last drink ~ 15-20 yr ago"    Review of Systems  Constitutional: Negative for fever and chills.  Respiratory: Negative for  shortness of breath.   Cardiovascular: Negative for chest pain.  Gastrointestinal: Negative for nausea, vomiting, abdominal pain and blood in stool.  Neurological: Negative for dizziness and light-headedness.  All other systems reviewed and are negative.    Allergies  Other  Home Medications   Current Outpatient Rx  Name  Route  Sig  Dispense  Refill  . albuterol (PROVENTIL HFA;VENTOLIN HFA) 108 (90 BASE) MCG/ACT inhaler   Inhalation   Inhale 2 puffs into the lungs every 6 (six) hours as needed for wheezing or shortness of breath.         Marland Kitchen albuterol (PROVENTIL) (2.5 MG/3ML) 0.083% nebulizer solution   Nebulization   Take 3 mLs (2.5 mg total) by nebulization every 6 (six) hours as needed for wheezing.   75 mL   12   . ARTIFICIAL TEAR OP   Both Eyes   Place 2 drops into both eyes daily as needed. For dry eyes          . aspirin 325 MG tablet   Oral   Take 1 tablet (325 mg total) by mouth daily.         Marland Kitchen atorvastatin (LIPITOR) 80 MG tablet   Oral   Take 80 mg by mouth at bedtime.         Marland Kitchen azithromycin (ZITHROMAX) 500 MG tablet   Oral   Take 1 tablet (500 mg total) by mouth daily. For 3 more days   3 tablet   0   . cefpodoxime (VANTIN) 200 MG tablet   Oral   Take 1 tablet (200 mg total) by mouth every 12 (twelve) hours. For 4 more days   8 tablet   0   . clopidogrel (PLAVIX) 75 MG tablet   Oral   Take 1 tablet (75 mg total) by mouth daily with breakfast.         . fluticasone (FLOVENT HFA) 110 MCG/ACT inhaler   Inhalation   Inhale 2 puffs into the lungs 2 (two) times daily.   1 Inhaler   12   . furosemide (LASIX) 40 MG tablet   Oral   Take 1 tablet (40 mg total) by mouth daily.   60 tablet   3   . hydrALAZINE (APRESOLINE) 25 MG tablet   Oral   Take 25 mg by mouth 3 (three) times daily.          . iron polysaccharides (NU-IRON) 150 MG capsule   Oral   Take 150 mg by mouth 2 (two) times daily.         . isosorbide mononitrate (IMDUR) 30 MG 24 hr tablet      TAKE ONE TABLET BY MOUTH ONCE DAILY   30 tablet   4   . losartan (COZAAR) 100 MG tablet   Oral   Take 100 mg by mouth daily.         . Meclizine HCl (BONINE) 25 MG CHEW   Oral   Chew 1 tablet by mouth 2 (two) times daily.           . montelukast (SINGULAIR) 10 MG tablet   Oral   Take 10 mg by mouth at bedtime.         . Multiple Vitamins-Minerals (MULTIVITAMIN & MINERAL PO)   Oral   Take 1 tablet by mouth daily.           . nebivolol (BYSTOLIC) 10 MG tablet   Oral   Take 1 tablet (10 mg total) by  mouth daily.   30 tablet   8   . nitroGLYCERIN (NITROSTAT) 0.4 MG SL tablet   Sublingual   Place 0.4 mg under the tongue every 5 (five) minutes as needed for chest pain.         Marland Kitchen omeprazole (PRILOSEC) 20 MG capsule   Oral   Take 2 capsules (40 mg total) by mouth 2 (two) times daily  before a meal.   120 capsule   0   . potassium chloride SA (K-DUR,KLOR-CON) 20 MEQ tablet   Oral   Take 20 mEq by mouth.         . tiotropium (SPIRIVA) 18 MCG inhalation capsule   Inhalation   Place 1 capsule (18 mcg total) into inhaler and inhale daily.   30 capsule   12    BP 134/58  Pulse 60  Temp(Src) 98.2 F (36.8 C) (Oral)  Resp 17  SpO2 100% Physical Exam  Constitutional: He is oriented to person, place, and time. He appears well-developed and well-nourished. No distress.  HENT:  Head: Normocephalic and atraumatic.  Mouth/Throat: No oropharyngeal exudate.  Eyes: Conjunctivae are normal. Pupils are equal, round, and reactive to light.  Neck: Normal range of motion. Neck supple.  Cardiovascular: Normal rate and normal heart sounds.  Exam reveals no gallop and no friction rub.   No murmur heard. Pulmonary/Chest: Effort normal and breath sounds normal.  Abdominal: Soft. He exhibits no distension. There is no tenderness.  Genitourinary: Guaiac negative stool.  Musculoskeletal: Normal range of motion. He exhibits no edema and no tenderness.  Neurological: He is alert and oriented to person, place, and time. He has normal strength and normal reflexes. No cranial nerve deficit or sensory deficit. Coordination normal. GCS eye subscore is 4. GCS verbal subscore is 5. GCS motor subscore is 6.  Skin: Skin is warm and dry.    ED Course  Procedures (including critical care time) Labs Review Labs Reviewed  CBC WITH DIFFERENTIAL - Abnormal; Notable for the following:    RBC 3.34 (*)    Hemoglobin 9.7 (*)    HCT 31.2 (*)    All other components within normal limits  COMPREHENSIVE METABOLIC PANEL - Abnormal; Notable for the following:    CO2 34 (*)    Glucose, Bld 102 (*)    Creatinine, Ser 1.62 (*)    GFR calc non Af Amer 38 (*)    GFR calc Af Amer 45 (*)    All other components within normal limits  PROTIME-INR  OCCULT BLOOD, POC DEVICE   Imaging Review No results  found.  EKG Interpretation    Date/Time:  Thursday July 26 2013 22:21:32 EST Ventricular Rate:  59 PR Interval:  61 QRS Duration: 103 QT Interval:  426 QTC Calculation: 422 R Axis:   -5 Text Interpretation:  Normal sinus rhythm Supraventricular bigeminy Low voltage, precordial leads No significant change since last tracing Confirmed by [place] ([id number]) on 07/26/2013 11:28:08 PM            MDM   Sent here for concern of anemia with recent GIB requiring transfusions. Afebrile. VSS. Asymptomatic. Guaiac negative. Hemoglobin stable. Labs c/w baseline. Don't suspect acute bleed. Follow up with pcp. Return precautions given and discussed with the patient who was in agreement with the plan.     1. Anemia       Donita Brooks, MD 07/28/13 1304

## 2013-07-26 NOTE — ED Notes (Signed)
Pt states he seen PCP today and was told to come here due to suspect internal bleed. Pt can not state where bleeding could be coming from. Pt state he has a hx of it and received 2 pints of blood. Pt denies any pain at this time. HR is 140 s which pt state is abnormal. Will obtain EKG and notify Md.

## 2013-07-26 NOTE — ED Notes (Signed)
PA at bedside.

## 2013-07-26 NOTE — Discharge Instructions (Signed)

## 2013-07-28 NOTE — ED Provider Notes (Signed)
I saw and evaluated the patient, reviewed the resident's note and I agree with the findings and plan.  EKG Interpretation    Date/Time:  Thursday July 26 2013 22:21:32 EST Ventricular Rate:  59 PR Interval:  61 QRS Duration: 103 QT Interval:  426 QTC Calculation: 422 R Axis:   -5 Text Interpretation:  Normal sinus rhythm Supraventricular bigeminy Low voltage, precordial leads No significant change since last tracing Confirmed by Zenia Resides  MD, Lisett Dirusso ([id number]) on 07/26/2013 11:28:08 PM             Leota Jacobsen, MD 07/28/13 1527

## 2013-08-07 ENCOUNTER — Inpatient Hospital Stay (HOSPITAL_COMMUNITY): Payer: Medicare Other

## 2013-08-07 ENCOUNTER — Encounter: Payer: Self-pay | Admitting: Physician Assistant

## 2013-08-07 ENCOUNTER — Encounter: Payer: Medicare Other | Admitting: Physician Assistant

## 2013-08-07 ENCOUNTER — Ambulatory Visit (INDEPENDENT_AMBULATORY_CARE_PROVIDER_SITE_OTHER): Payer: Medicare Other | Admitting: Physician Assistant

## 2013-08-07 ENCOUNTER — Encounter (HOSPITAL_COMMUNITY): Payer: Self-pay | Admitting: *Deleted

## 2013-08-07 ENCOUNTER — Inpatient Hospital Stay (HOSPITAL_COMMUNITY)
Admission: AD | Admit: 2013-08-07 | Discharge: 2013-08-10 | DRG: 292 | Disposition: A | Discharge status: Home / Self Care | Payer: Medicare Other | Source: Ambulatory Visit | Attending: Cardiovascular Disease | Admitting: Cardiovascular Disease

## 2013-08-07 VITALS — BP 130/68 | HR 62 | Ht 71.0 in | Wt 313.0 lb

## 2013-08-07 DIAGNOSIS — Z8249 Family history of ischemic heart disease and other diseases of the circulatory system: Secondary | ICD-10-CM

## 2013-08-07 DIAGNOSIS — Z9861 Coronary angioplasty status: Secondary | ICD-10-CM

## 2013-08-07 DIAGNOSIS — I2510 Atherosclerotic heart disease of native coronary artery without angina pectoris: Secondary | ICD-10-CM | POA: Diagnosis present

## 2013-08-07 DIAGNOSIS — I509 Heart failure, unspecified: Secondary | ICD-10-CM

## 2013-08-07 DIAGNOSIS — D62 Acute posthemorrhagic anemia: Secondary | ICD-10-CM | POA: Diagnosis present

## 2013-08-07 DIAGNOSIS — Z79899 Other long term (current) drug therapy: Secondary | ICD-10-CM

## 2013-08-07 DIAGNOSIS — J449 Chronic obstructive pulmonary disease, unspecified: Secondary | ICD-10-CM

## 2013-08-07 DIAGNOSIS — N183 Chronic kidney disease, stage 3 unspecified: Secondary | ICD-10-CM | POA: Diagnosis present

## 2013-08-07 DIAGNOSIS — E785 Hyperlipidemia, unspecified: Secondary | ICD-10-CM | POA: Diagnosis present

## 2013-08-07 DIAGNOSIS — Z951 Presence of aortocoronary bypass graft: Secondary | ICD-10-CM

## 2013-08-07 DIAGNOSIS — I517 Cardiomegaly: Secondary | ICD-10-CM

## 2013-08-07 DIAGNOSIS — Z9981 Dependence on supplemental oxygen: Secondary | ICD-10-CM

## 2013-08-07 DIAGNOSIS — I5032 Chronic diastolic (congestive) heart failure: Secondary | ICD-10-CM

## 2013-08-07 DIAGNOSIS — I129 Hypertensive chronic kidney disease with stage 1 through stage 4 chronic kidney disease, or unspecified chronic kidney disease: Secondary | ICD-10-CM | POA: Diagnosis present

## 2013-08-07 DIAGNOSIS — I10 Essential (primary) hypertension: Secondary | ICD-10-CM

## 2013-08-07 DIAGNOSIS — J9610 Chronic respiratory failure, unspecified whether with hypoxia or hypercapnia: Secondary | ICD-10-CM | POA: Diagnosis present

## 2013-08-07 DIAGNOSIS — Z7982 Long term (current) use of aspirin: Secondary | ICD-10-CM

## 2013-08-07 DIAGNOSIS — I252 Old myocardial infarction: Secondary | ICD-10-CM

## 2013-08-07 DIAGNOSIS — G4730 Sleep apnea, unspecified: Secondary | ICD-10-CM | POA: Diagnosis present

## 2013-08-07 DIAGNOSIS — K219 Gastro-esophageal reflux disease without esophagitis: Secondary | ICD-10-CM | POA: Diagnosis present

## 2013-08-07 DIAGNOSIS — I25810 Atherosclerosis of coronary artery bypass graft(s) without angina pectoris: Secondary | ICD-10-CM

## 2013-08-07 DIAGNOSIS — Z6841 Body Mass Index (BMI) 40.0 and over, adult: Secondary | ICD-10-CM

## 2013-08-07 DIAGNOSIS — N184 Chronic kidney disease, stage 4 (severe): Secondary | ICD-10-CM | POA: Diagnosis present

## 2013-08-07 DIAGNOSIS — I5033 Acute on chronic diastolic (congestive) heart failure: Secondary | ICD-10-CM

## 2013-08-07 DIAGNOSIS — E669 Obesity, unspecified: Secondary | ICD-10-CM

## 2013-08-07 DIAGNOSIS — H9190 Unspecified hearing loss, unspecified ear: Secondary | ICD-10-CM | POA: Diagnosis present

## 2013-08-07 DIAGNOSIS — E119 Type 2 diabetes mellitus without complications: Secondary | ICD-10-CM | POA: Diagnosis present

## 2013-08-07 DIAGNOSIS — I200 Unstable angina: Secondary | ICD-10-CM

## 2013-08-07 DIAGNOSIS — J4489 Other specified chronic obstructive pulmonary disease: Secondary | ICD-10-CM

## 2013-08-07 DIAGNOSIS — N189 Chronic kidney disease, unspecified: Secondary | ICD-10-CM

## 2013-08-07 DIAGNOSIS — Z7902 Long term (current) use of antithrombotics/antiplatelets: Secondary | ICD-10-CM

## 2013-08-07 DIAGNOSIS — E1129 Type 2 diabetes mellitus with other diabetic kidney complication: Secondary | ICD-10-CM | POA: Diagnosis present

## 2013-08-07 DIAGNOSIS — R079 Chest pain, unspecified: Secondary | ICD-10-CM

## 2013-08-07 DIAGNOSIS — E039 Hypothyroidism, unspecified: Secondary | ICD-10-CM | POA: Diagnosis present

## 2013-08-07 HISTORY — DX: Morbid (severe) obesity due to excess calories: E66.01

## 2013-08-07 LAB — COMPREHENSIVE METABOLIC PANEL
ALT: 17 U/L (ref 0–53)
AST: 19 U/L (ref 0–37)
Albumin: 3.3 g/dL — ABNORMAL LOW (ref 3.5–5.2)
Alkaline Phosphatase: 79 U/L (ref 39–117)
BILIRUBIN TOTAL: 0.3 mg/dL (ref 0.3–1.2)
BUN: 29 mg/dL — AB (ref 6–23)
CHLORIDE: 100 meq/L (ref 96–112)
CO2: 33 meq/L — AB (ref 19–32)
Calcium: 9 mg/dL (ref 8.4–10.5)
Creatinine, Ser: 1.56 mg/dL — ABNORMAL HIGH (ref 0.50–1.35)
GFR, EST AFRICAN AMERICAN: 47 mL/min — AB (ref >90)
GFR, EST NON AFRICAN AMERICAN: 40 mL/min — AB (ref >90)
GLUCOSE: 87 mg/dL (ref 70–99)
POTASSIUM: 4.4 meq/L (ref 3.7–5.3)
SODIUM: 143 meq/L (ref 137–147)
Total Protein: 6.3 g/dL (ref 6.0–8.3)

## 2013-08-07 LAB — MRSA PCR SCREENING: MRSA by PCR: NEGATIVE

## 2013-08-07 LAB — CBC WITH DIFFERENTIAL/PLATELET
Basophils Absolute: 0 10*3/uL (ref 0.0–0.1)
Basophils Relative: 0 % (ref 0–1)
EOS ABS: 0.2 10*3/uL (ref 0.0–0.7)
Eosinophils Relative: 3 % (ref 0–5)
HCT: 27.9 % — ABNORMAL LOW (ref 39.0–52.0)
HEMOGLOBIN: 8.7 g/dL — AB (ref 13.0–17.0)
LYMPHS ABS: 1.9 10*3/uL (ref 0.7–4.0)
LYMPHS PCT: 25 % (ref 12–46)
MCH: 28.6 pg (ref 26.0–34.0)
MCHC: 31.2 g/dL (ref 30.0–36.0)
MCV: 91.8 fL (ref 78.0–100.0)
MONOS PCT: 12 % (ref 3–12)
Monocytes Absolute: 0.9 10*3/uL (ref 0.1–1.0)
NEUTROS ABS: 4.5 10*3/uL (ref 1.7–7.7)
NEUTROS PCT: 60 % (ref 43–77)
Platelets: 259 10*3/uL (ref 150–400)
RBC: 3.04 MIL/uL — AB (ref 4.22–5.81)
RDW: 14.9 % (ref 11.5–15.5)
WBC: 7.5 10*3/uL (ref 4.0–10.5)

## 2013-08-07 LAB — TSH: TSH: 2.745 u[IU]/mL (ref 0.350–4.500)

## 2013-08-07 LAB — TROPONIN I
Troponin I: <0.3 ng/mL (ref <0.30)
Troponin I: <0.3 ng/mL (ref <0.30)

## 2013-08-07 LAB — PRO B NATRIURETIC PEPTIDE: Pro B Natriuretic peptide (BNP): 892.3 pg/mL — ABNORMAL HIGH (ref 0–450)

## 2013-08-07 MED ORDER — MONTELUKAST SODIUM 10 MG PO TABS
10.0000 mg | ORAL_TABLET | Freq: Every day | ORAL | Status: DC
  Administered 2013-08-07 – 2013-08-09 (×3): 10 mg via ORAL
  Filled 2013-08-07 (×4): qty 1

## 2013-08-07 MED ORDER — ATORVASTATIN CALCIUM 80 MG PO TABS
80.0000 mg | ORAL_TABLET | Freq: Every day | ORAL | Status: DC
  Administered 2013-08-07 – 2013-08-09 (×3): 80 mg via ORAL
  Filled 2013-08-07 (×4): qty 1

## 2013-08-07 MED ORDER — ALBUTEROL SULFATE (2.5 MG/3ML) 0.083% IN NEBU: 2.5000 mg | INHALATION_SOLUTION | Freq: Four times a day (QID) | RESPIRATORY_TRACT | Status: DC | PRN

## 2013-08-07 MED ORDER — POTASSIUM CHLORIDE CRYS ER 20 MEQ PO TBCR
20.0000 meq | EXTENDED_RELEASE_TABLET | Freq: Two times a day (BID) | ORAL | Status: DC
  Administered 2013-08-07 – 2013-08-10 (×7): 20 meq via ORAL
  Filled 2013-08-07 (×8): qty 1

## 2013-08-07 MED ORDER — SODIUM CHLORIDE 0.9 % IJ SOLN
3.0000 mL | Freq: Two times a day (BID) | INTRAMUSCULAR | Status: DC
  Administered 2013-08-07 – 2013-08-10 (×6): 3 mL via INTRAVENOUS

## 2013-08-07 MED ORDER — CLOPIDOGREL BISULFATE 75 MG PO TABS
75.0000 mg | ORAL_TABLET | Freq: Every day | ORAL | Status: DC
  Administered 2013-08-08 – 2013-08-10 (×3): 75 mg via ORAL
  Filled 2013-08-07 (×3): qty 1

## 2013-08-07 MED ORDER — FUROSEMIDE 10 MG/ML IJ SOLN
80.0000 mg | Freq: Two times a day (BID) | INTRAMUSCULAR | Status: DC
  Administered 2013-08-07 – 2013-08-10 (×6): 80 mg via INTRAVENOUS
  Filled 2013-08-07 (×8): qty 8

## 2013-08-07 MED ORDER — ADULT MULTIVITAMIN W/MINERALS CH
1.0000 | ORAL_TABLET | Freq: Every day | ORAL | Status: DC
  Administered 2013-08-08 – 2013-08-10 (×3): 1 via ORAL
  Filled 2013-08-07 (×3): qty 1

## 2013-08-07 MED ORDER — ONDANSETRON HCL 4 MG/2ML IJ SOLN: 4.0000 mg | Freq: Four times a day (QID) | INTRAMUSCULAR | Status: DC | PRN

## 2013-08-07 MED ORDER — ACETAMINOPHEN 325 MG PO TABS
650.0000 mg | ORAL_TABLET | ORAL | Status: DC | PRN
Start: 2013-08-07 — End: 2013-08-10

## 2013-08-07 MED ORDER — FLUTICASONE PROPIONATE HFA 110 MCG/ACT IN AERO
2.0000 | INHALATION_SPRAY | Freq: Two times a day (BID) | RESPIRATORY_TRACT | Status: DC
  Administered 2013-08-07 – 2013-08-10 (×7): 2 via RESPIRATORY_TRACT
  Filled 2013-08-07: qty 12

## 2013-08-07 MED ORDER — NEBIVOLOL HCL 10 MG PO TABS
10.0000 mg | ORAL_TABLET | Freq: Every day | ORAL | Status: DC
  Administered 2013-08-08 – 2013-08-10 (×3): 10 mg via ORAL
  Filled 2013-08-07 (×3): qty 1

## 2013-08-07 MED ORDER — ISOSORBIDE MONONITRATE ER 30 MG PO TB24
30.0000 mg | ORAL_TABLET | Freq: Every day | ORAL | Status: DC
  Administered 2013-08-08 – 2013-08-10 (×3): 30 mg via ORAL
  Filled 2013-08-07 (×3): qty 1

## 2013-08-07 MED ORDER — HYDRALAZINE HCL 25 MG PO TABS
25.0000 mg | ORAL_TABLET | Freq: Three times a day (TID) | ORAL | Status: DC
  Administered 2013-08-07 – 2013-08-10 (×8): 25 mg via ORAL
  Filled 2013-08-07 (×11): qty 1

## 2013-08-07 MED ORDER — SODIUM CHLORIDE 0.9 % IJ SOLN: 3.0000 mL | INTRAMUSCULAR | Status: DC | PRN

## 2013-08-07 MED ORDER — NITROGLYCERIN 0.4 MG SL SUBL: 0.4000 mg | SUBLINGUAL_TABLET | SUBLINGUAL | Status: DC | PRN

## 2013-08-07 MED ORDER — LIVING BETTER WITH HEART FAILURE BOOK
Freq: Once | Status: AC
  Administered 2013-08-07: 1
  Filled 2013-08-07: qty 1

## 2013-08-07 MED ORDER — LOSARTAN POTASSIUM 50 MG PO TABS: 100.0000 mg | ORAL_TABLET | Freq: Every day | ORAL | Status: DC

## 2013-08-07 MED ORDER — PANTOPRAZOLE SODIUM 40 MG PO TBEC
80.0000 mg | DELAYED_RELEASE_TABLET | Freq: Two times a day (BID) | ORAL | Status: DC
  Administered 2013-08-07 – 2013-08-10 (×6): 80 mg via ORAL
  Filled 2013-08-07 (×7): qty 2

## 2013-08-07 MED ORDER — ASPIRIN EC 81 MG PO TBEC
81.0000 mg | DELAYED_RELEASE_TABLET | Freq: Every day | ORAL | Status: DC
  Administered 2013-08-08 – 2013-08-10 (×3): 81 mg via ORAL
  Filled 2013-08-07 (×3): qty 1

## 2013-08-07 MED ORDER — SODIUM CHLORIDE 0.9 % IV SOLN: 250.0000 mL | INTRAVENOUS | Status: DC | PRN

## 2013-08-07 MED ORDER — POLYSACCHARIDE IRON COMPLEX 150 MG PO CAPS
150.0000 mg | ORAL_CAPSULE | Freq: Two times a day (BID) | ORAL | Status: DC
  Administered 2013-08-07 – 2013-08-10 (×7): 150 mg via ORAL
  Filled 2013-08-07 (×10): qty 1

## 2013-08-07 MED ORDER — TIOTROPIUM BROMIDE MONOHYDRATE 18 MCG IN CAPS
18.0000 ug | ORAL_CAPSULE | Freq: Every day | RESPIRATORY_TRACT | Status: DC
  Administered 2013-08-07 – 2013-08-10 (×4): 18 ug via RESPIRATORY_TRACT
  Filled 2013-08-07: qty 5

## 2013-08-07 MED ORDER — ALBUTEROL SULFATE HFA 108 (90 BASE) MCG/ACT IN AERS: 2.0000 | INHALATION_SPRAY | Freq: Four times a day (QID) | RESPIRATORY_TRACT | Status: DC | PRN

## 2013-08-07 NOTE — Progress Notes (Signed)
  Echocardiogram 2D Echocardiogram has been performed.  Donata Clay 08/07/2013, 4:40 PM

## 2013-08-07 NOTE — Progress Notes (Signed)
[address] [place], [place]  [address] Phone: [phone] Fax:  [phone]  Date:  08/07/2013   ID:  ITZEL Sanchez, DOB Jan 17, 1931, MRN [id number]  PCP:   Melinda Crutch, MD  Cardiologist:  Previously Dr. Verl Blalock   History of Present Illness: [name] is a 80 y.o. male with history of CAD s/p CABG 1995, NSTEMI with DESx2 in 12/2011, COPD with chronic respiratory failure on home O2, probable CKD stage III, HTN, chronic diastolic CHF who presents to clinic for hospital followup following admission in December for GI bleed. He was hospitalized in early December with COPD exacerbation (TnI neg). He returned to the hospital around Christmas with melena and ABL anemia. He required 2 units PRBC for Hgb into the 7 range. EGD confirmed a prepyloric ulcer with path negative for malignancy.  Hospital course was complicated by renal insufficiency - dc Cr 1.62. He likely has CKD as Cr in 2013 ran 1.3-1.7. Last Hgb on 07/26/13 was stable at 9.7 with negative FOBT. Aspirin and Plavix were held and restarted. Last 2D Echo 10/26/11: normal LV, EF 55-60%, mod dilated LA, calcified right and noncoronary commisure. At last cath in 12/2011 the recommendation was to continue DAPT x 1 yr.  He has had no further melena. However, he has continued to feel weaker since discharge. He reports daily angina, 3-4x per day, occurring if he takes off his oxygen and always with exertion. He can only walk a few steps before he gets significant DOE and chest pressure described as squeezing. Symptoms resolve if he replaces his oxygen or stops to rest for 5 mins. Has not had any rest pain. Hasn't taken NTG recently. Symptoms have persisted over the last 4 months, worse over the last several weeks. He has chronic orthopnea. Reports increasing LEE and abdominal distention. Usual weight is 292, discharge wt in December was 297, was 304 at home last week and 313 in clinic today. He reports increasing Lasix at home to 40mg  TID. No  palpitations or syncope. Drinks a significant amount of fluid daily - 4-6 bottles of water/juice daily. He had two falls a few weeks ago where his legs just gave out - no syncope.  Recent Labs: 07/02/2013: Pro B Natriuretic peptide (BNP) 185.6  07/26/2013: ALT 18; Creatinine 1.62*; Hemoglobin 9.7*; Potassium 4.3   Wt Readings from Last 3 Encounters:  08/07/13 313 lb (141.976 kg)  07/13/13 297 lb 6.4 oz (134.9 kg)  07/13/13 297 lb 6.4 oz (134.9 kg)     Past Medical History  Diagnosis Date  . CAD (coronary artery disease)     a. s/p CABG x 3 in 1995; b.  NSTEMI 12/2011 -> Cath 6/21: LM 20-30, LAD 90, RI 95, LCX 62m, RCA 90/170m (treated w/ 2.0x16 Promus DES), VG->RCA ok, VG->RI 70-4m (treated w/ 4.0x28 Promus), LIMA->LAD ok, EF 55-60%.  Marland Kitchen HTN (hypertension)   . HLD (hyperlipidemia)   . Obesity   . Syncope   . COPD (chronic obstructive pulmonary disease)     a. on home O2  . HOH (hard of hearing)     left ear  . Asthma   . Sleep apnea   . Chronic diastolic CHF (congestive heart failure)     a. Echo 10/2011: EF 55-60%;  b. elevated EDP req diuresis.  . Chronic respiratory failure 07/11/2013    a. 3L home O2 24/7.  Marland Kitchen Orthopnea     a. Has been sleeping in a recliner x 30 yrs.  Marland Kitchen  Hypothyroidism   . GERD (gastroesophageal reflux disease)   . Arthritis     "knees; hands" (07/11/2013)  . GI bleed     a. 06/2013 adm - prepyloric ulcer, path neg for malignancy. A/w ABL anemia.  . Acute blood loss anemia     a. 06/2013 due to GIB.  . CKD (chronic kidney disease)     a. ?Based on Cr 2013 - 1.3-1.7.    Current Outpatient Prescriptions  Medication Sig Dispense Refill  . albuterol (PROVENTIL HFA;VENTOLIN HFA) 108 (90 BASE) MCG/ACT inhaler Inhale 2 puffs into the lungs every 6 (six) hours as needed for wheezing or shortness of breath.      Marland Kitchen albuterol (PROVENTIL) (2.5 MG/3ML) 0.083% nebulizer solution Take 3 mLs (2.5 mg total) by nebulization every 6 (six) hours as needed for wheezing.   75 mL  12  . ARTIFICIAL TEAR OP Place 2 drops into both eyes daily as needed. For dry eyes      . aspirin 325 MG tablet Take 1 tablet (325 mg total) by mouth daily.      Marland Kitchen atorvastatin (LIPITOR) 80 MG tablet Take 80 mg by mouth at bedtime.      . clopidogrel (PLAVIX) 75 MG tablet Take 1 tablet (75 mg total) by mouth daily with breakfast.      . fluticasone (FLOVENT HFA) 110 MCG/ACT inhaler Inhale 2 puffs into the lungs 2 (two) times daily.  1 Inhaler  12  . furosemide (LASIX) 40 MG tablet Take 40 mg by mouth 3 (three) times daily.      . hydrALAZINE (APRESOLINE) 25 MG tablet Take 25 mg by mouth 3 (three) times daily.       . iron polysaccharides (NU-IRON) 150 MG capsule Take 150 mg by mouth 2 (two) times daily.      . isosorbide mononitrate (IMDUR) 30 MG 24 hr tablet TAKE ONE TABLET BY MOUTH ONCE DAILY  30 tablet  4  . losartan (COZAAR) 100 MG tablet Take 100 mg by mouth daily.      . Meclizine HCl (BONINE) 25 MG CHEW Chew 1 tablet by mouth 2 (two) times daily.        . montelukast (SINGULAIR) 10 MG tablet Take 10 mg by mouth at bedtime.      . Multiple Vitamins-Minerals (MULTIVITAMIN & MINERAL PO) Take 1 tablet by mouth daily.        . nebivolol (BYSTOLIC) 10 MG tablet Take 1 tablet (10 mg total) by mouth daily.  30 tablet  8  . nitroGLYCERIN (NITROSTAT) 0.4 MG SL tablet Place 0.4 mg under the tongue every 5 (five) minutes as needed for chest pain.      Marland Kitchen omeprazole (PRILOSEC) 20 MG capsule Take 2 capsules (40 mg total) by mouth 2 (two) times daily before a meal.  120 capsule  0  . potassium chloride SA (K-DUR,KLOR-CON) 20 MEQ tablet Take 20 mEq by mouth 2 (two) times daily.       Marland Kitchen tiotropium (SPIRIVA) 18 MCG inhalation capsule Place 1 capsule (18 mcg total) into inhaler and inhale daily.  30 capsule  12   No current facility-administered medications for this visit.    Allergies:   Other - cream cheese makes him sick  Social History:  The patient  reports that he has never smoked. He has  never used smokeless tobacco. He reports that he drinks alcohol. He reports that he does not use illicit drugs.   Family History:  The patient's family history  includes CAD in an other family member.   ROS:  Please see the history of present illness.   No melena, hematemesis hematuria, BRBPR. No syncope. All other systems reviewed and negative.   PHYSICAL EXAM: VS:  BP 130/68  Pulse 62  Ht 5\' 11"  (1.803 m)  Wt 313 lb (141.976 kg)  BMI 43.67 kg/m2 Well nourished, obese WM in no acute distress on home O2 HEENT: normal Neck: JVD difficult to assess Cardiac:  normal S1, S2;  RRR; occasional ectopy, no murmur Lungs:  Markedly diminished BS bilateral bases, otherwise coarse BS. Abd: soft, rounded, nontender, no hepatomegaly Ext: 1+ bilat LE edema Skin: warm and dry Neuro:  CNs 2-12 intact, no focal abnormalities noted  EKG:  NSR 79bpm 1st degree AVB (PR 262ms), inferior infarct age undetermined, no acute ST-T changes  ASSESSMENT AND PLAN:  1. Acute on chronic diastolic CHF 2. CAD s/p CABG 1995, DESx2 in 12/2011, with symptoms concerning for unstable angina  3. COPD with chronic respiratory failure on home O2 4. Probable CKD stage III 5. HTN 6. Recent ABL anemia with gastric ulcer 7. Morbid obesity Body mass index is 43.67 kg/(m^2).  The patient was seen and examined by myself and Dr. Acie Fredrickson in clinic. Symptoms concerning for worsening CHF and angina with 16lb weight gain from hospital discharge. Given significant volume overload, recommend hospital admission for diurese. Will admit, cycle cardiac enzymes, check labs, CXR. IV Lasix 80mg  BID for now with careful attention to renal function. Hold off heparin unless enzymes turn positive given recent GIB. SCDs for DVT PPX. Will continue ASA, Plavix, statin, BB, Imdur for now. If Cr abnormal may need to hold ARB. He took his meds already today including full dose ASA and Plavix. May need consideration for ischemic eval this admission given  increasing angina.  Signed, Melina Copa, PA-C  08/07/2013 1:20 PM

## 2013-08-07 NOTE — Patient Instructions (Addendum)
PT ADMITTED TODAY TO STEP DOWN 2 H .Marland KitchenPER DANYA DUNN

## 2013-08-07 NOTE — Plan of Care (Signed)
Problem: Food- and Nutrition-Related Knowledge Deficit (NB-1.1) Goal: Nutrition education Formal process to instruct or train a patient/client in a skill or to impart knowledge to help patients/clients voluntarily manage or modify food choices and eating behavior to maintain or improve health. Outcome: Completed/Met Date Met:  08/07/13 Nutrition Education Note  Dietetic Intern consulted for nutrition education regarding CHF diet education.  Pt admitted with weight of 313 lb (clinic weight). His usual weight is 297 lb. Per notes pt with increasing weight at home and increased use of lasix.   Dietetic intern provided "Heart Failure Nutrition Therapy" from the [employer]. Dietetic Intern was unable to talk with patient (undergoing procedure). Dietetic Intern was able to speak with the patient's son and daughter who were outside the patient's room. The patient's daughter stated that she and another son provided home cooked meals for the patient. She reported that she does not cook with added sodium, however she stated that she cooked a lot of soups. She also stated that the patient likes to drink a lot of milk throughout the day.   Dietetic Intern encouraged the patient's daughter to monitor his fluid intake and limit fluids to 1500 ml/day. Discussed what foods count towards daily fluid requirements. Discouraged intake of processed or fast foods. Encouraged the patient's daughter to read labels to find the sodium content of food products she uses to cook his meals at home. Patient's daughter was receptive to the education but stated that her father was 80 years old and not likely to change his eating habits. Tach back method used.  Expect fair compliance.  Body mass index is 43 kg/m2. Patient meets criteria for obesity class III based on current BMI.  Current Diet order is heart healthy with 1.5 L fluid restriction and 2g sodium restriction.  Labs and medications reviewed. No  further nutrition interventions warranted at this time. Rd contact information provided. If additional nutrition issues arise, please re-consult RD.  Claudell Kyle, Dietetic Intern Pager: [phone]    Intern note/chart reviewed. Revisions made.  Minkler, Celeste, [place] Pager [phone] After Hours Pager

## 2013-08-07 NOTE — Progress Notes (Addendum)
Labs reviewed. Renal function stable. pBNP up from where it was in December (obesity may mask higher values). Initial troponin negative. BP soft this afternoon so will hold ARB so as not to reduce BP too much while diuresing. Charod Slawinski PA-C  Addendum: hgb slightly down but may be dilutional. F/u CBC in AM, hemoccult stool. Last hemoccult 1/8 was negative. Atari Novick PA-C

## 2013-08-07 NOTE — H&P (Signed)
[address] [place], [place]  [address] Phone: [phone] Fax:  [phone]  Date:  08/07/2013   ID:  [name], DOB [birth year], MRN [id number]  PCP:   Melinda Crutch, MD  Cardiologist:  Previously Dr. Verl Blalock   History of Present Illness: [name] is a 80 y.o. male with history of CAD s/p CABG 1995, NSTEMI with DESx2 in 12/2011, COPD with chronic respiratory failure on home O2, probable CKD stage III, HTN, chronic diastolic CHF who presents to clinic for hospital followup following admission in December for GI bleed. He was hospitalized in early December with COPD exacerbation (TnI neg). He returned to the hospital around Christmas with melena and ABL anemia. He required 2 units PRBC for Hgb into the 7 range. EGD confirmed a prepyloric ulcer with path negative for malignancy.  Hospital course was complicated by renal insufficiency - dc Cr 1.62. He likely has CKD as Cr in 2013 ran 1.3-1.7. Last Hgb on 07/26/13 was stable at 9.7 with negative FOBT. Aspirin and Plavix were held and restarted. Last 2D Echo 10/26/11: normal LV, EF 55-60%, mod dilated LA, calcified right and noncoronary commisure. At last cath in 12/2011 the recommendation was to continue DAPT x 1 yr.  He has had no further melena. However, he has continued to feel weaker since discharge. He reports daily angina, 3-4x per day, occurring if he takes off his oxygen and always with exertion. He can only walk a few steps before he gets significant DOE and chest pressure described as squeezing. Symptoms resolve if he replaces his oxygen or stops to rest for 5 mins. Has not had any rest pain. Hasn't taken NTG recently. Symptoms have persisted over the last 4 months, worse over the last several weeks. He has chronic orthopnea. Reports increasing LEE and abdominal distention. Usual weight is 292, discharge wt in December was 297, was 304 at home last week and 313 in clinic today. He reports increasing Lasix at home to 40mg  TID. No  palpitations or syncope. Drinks a significant amount of fluid daily - 4-6 bottles of water/juice daily. He had two falls a few weeks ago where his legs just gave out - no syncope.  Recent Labs: 07/02/2013: Pro B Natriuretic peptide (BNP) 185.6  07/26/2013: ALT 18; Creatinine 1.62*; Hemoglobin 9.7*; Potassium 4.3   Wt Readings from Last 3 Encounters:  08/07/13 313 lb (141.976 kg)  07/13/13 297 lb 6.4 oz (134.9 kg)  07/13/13 297 lb 6.4 oz (134.9 kg)     Past Medical History  Diagnosis Date  . CAD (coronary artery disease)     a. s/p CABG x 3 in 1995; b.  NSTEMI 12/2011 -> Cath 6/21: LM 20-30, LAD 90, RI 95, LCX 68m, RCA 90/148m (treated w/ 2.0x16 Promus DES), VG->RCA ok, VG->RI 70-62m (treated w/ 4.0x28 Promus), LIMA->LAD ok, EF 55-60%.  Marland Kitchen HTN (hypertension)   . HLD (hyperlipidemia)   . Obesity   . Syncope   . COPD (chronic obstructive pulmonary disease)     a. on home O2  . HOH (hard of hearing)     left ear  . Asthma   . Sleep apnea   . Chronic diastolic CHF (congestive heart failure)     a. Echo 10/2011: EF 55-60%;  b. elevated EDP req diuresis.  . Chronic respiratory failure 07/11/2013    a. 3L home O2 24/7.  Marland Kitchen Orthopnea     a. Has been sleeping in a recliner x 30 yrs.  Marland Kitchen  Hypothyroidism   . GERD (gastroesophageal reflux disease)   . Arthritis     "knees; hands" (07/11/2013)  . GI bleed     a. 06/2013 adm - prepyloric ulcer, path neg for malignancy. A/w ABL anemia.  . Acute blood loss anemia     a. 06/2013 due to GIB.  . CKD (chronic kidney disease)     a. ?Based on Cr 2013 - 1.3-1.7.    Current Outpatient Prescriptions  Medication Sig Dispense Refill  . albuterol (PROVENTIL HFA;VENTOLIN HFA) 108 (90 BASE) MCG/ACT inhaler Inhale 2 puffs into the lungs every 6 (six) hours as needed for wheezing or shortness of breath.      Marland Kitchen albuterol (PROVENTIL) (2.5 MG/3ML) 0.083% nebulizer solution Take 3 mLs (2.5 mg total) by nebulization every 6 (six) hours as needed for wheezing.   75 mL  12  . ARTIFICIAL TEAR OP Place 2 drops into both eyes daily as needed. For dry eyes      . aspirin 325 MG tablet Take 1 tablet (325 mg total) by mouth daily.      Marland Kitchen atorvastatin (LIPITOR) 80 MG tablet Take 80 mg by mouth at bedtime.      . clopidogrel (PLAVIX) 75 MG tablet Take 1 tablet (75 mg total) by mouth daily with breakfast.      . fluticasone (FLOVENT HFA) 110 MCG/ACT inhaler Inhale 2 puffs into the lungs 2 (two) times daily.  1 Inhaler  12  . furosemide (LASIX) 40 MG tablet Take 40 mg by mouth 3 (three) times daily.      . hydrALAZINE (APRESOLINE) 25 MG tablet Take 25 mg by mouth 3 (three) times daily.       . iron polysaccharides (NU-IRON) 150 MG capsule Take 150 mg by mouth 2 (two) times daily.      . isosorbide mononitrate (IMDUR) 30 MG 24 hr tablet TAKE ONE TABLET BY MOUTH ONCE DAILY  30 tablet  4  . losartan (COZAAR) 100 MG tablet Take 100 mg by mouth daily.      . Meclizine HCl (BONINE) 25 MG CHEW Chew 1 tablet by mouth 2 (two) times daily.        . montelukast (SINGULAIR) 10 MG tablet Take 10 mg by mouth at bedtime.      . Multiple Vitamins-Minerals (MULTIVITAMIN & MINERAL PO) Take 1 tablet by mouth daily.        . nebivolol (BYSTOLIC) 10 MG tablet Take 1 tablet (10 mg total) by mouth daily.  30 tablet  8  . nitroGLYCERIN (NITROSTAT) 0.4 MG SL tablet Place 0.4 mg under the tongue every 5 (five) minutes as needed for chest pain.      Marland Kitchen omeprazole (PRILOSEC) 20 MG capsule Take 2 capsules (40 mg total) by mouth 2 (two) times daily before a meal.  120 capsule  0  . potassium chloride SA (K-DUR,KLOR-CON) 20 MEQ tablet Take 20 mEq by mouth 2 (two) times daily.       Marland Kitchen tiotropium (SPIRIVA) 18 MCG inhalation capsule Place 1 capsule (18 mcg total) into inhaler and inhale daily.  30 capsule  12   No current facility-administered medications for this visit.    Allergies:   Other - cream cheese makes him sick  Social History:  The patient  reports that he has never smoked. He has  never used smokeless tobacco. He reports that he drinks alcohol. He reports that he does not use illicit drugs.   Family History:  The patient's family history  includes CAD in an other family member.   ROS:  Please see the history of present illness.   No melena, hematemesis hematuria, BRBPR. No syncope. All other systems reviewed and negative.   PHYSICAL EXAM: VS:  BP 130/68  Pulse 62  Ht 5\' 11"  (1.803 m)  Wt 313 lb (141.976 kg)  BMI 43.67 kg/m2 Well nourished, obese WM in no acute distress on home O2 HEENT: normal Neck: JVD difficult to assess Cardiac:  normal S1, S2;  RRR; occasional ectopy, no murmur Lungs:  Markedly diminished BS bilateral bases, otherwise coarse BS. Abd: soft, rounded, nontender, no hepatomegaly Ext: 1+ bilat LE edema Skin: warm and dry Neuro:  CNs 2-12 intact, no focal abnormalities noted  EKG:  NSR 79bpm 1st degree AVB (PR 269ms), inferior infarct age undetermined, no acute ST-T changes  ASSESSMENT AND PLAN:  1. Acute on chronic diastolic CHF 2. CAD s/p CABG 1995, DESx2 in 12/2011, with symptoms concerning for unstable angina  3. COPD with chronic respiratory failure on home O2 4. Probable CKD stage III 5. HTN 6. Recent ABL anemia with gastric ulcer 7. Morbid obesity Body mass index is 43.67 kg/(m^2).  The patient was seen and examined by myself and Dr. Acie Fredrickson in clinic. Symptoms concerning for worsening CHF and angina with 16lb weight gain from hospital discharge. Given significant volume overload, recommend hospital admission for diurese. Will admit, cycle cardiac enzymes, check labs, CXR. IV Lasix 80mg  BID for now with careful attention to renal function. Hold off heparin unless enzymes turn positive given recent GIB. SCDs for DVT PPX. Will continue ASA, Plavix, statin, BB, Imdur for now. If Cr abnormal may need to hold ARB. He took his meds already today including full dose ASA and Plavix. May need consideration for ischemic eval this admission given  increasing angina.  Signed, Melina Copa, PA-C  08/07/2013 1:20 PM   Attending Note:   The patient was seen and examined.  Agree with assessment and plan as noted above.  Changes made to the above note as needed.  Mr. Treese has decompensated diastolic CHF.  He has not been able to improve on PO lasix - despite taking 3 a day.  I agree that he need hospitalization for IV diuresis and for tune up.  He also has CKD that will need to be monitored.    If he continues to have significant prolems with his CHF, he may need a referal to the CHF service.   Thayer Headings, Brooke Bonito., MD, [facility] 08/07/2013, 6:02 PM

## 2013-08-07 NOTE — Care Management Note (Addendum)
    Page 1 of 2   08/10/2013     2:01:07 PM   CARE MANAGEMENT NOTE 08/10/2013  Patient:  [name], [name]   Account Number:  [id number]  Date Initiated:  08/07/2013  Documentation initiated by:  Elissa Hefty  Subjective/Objective Assessment:   adm w heart failure     Action/Plan:   lives w fam, pcp dr Melinda Crutch   Anticipated DC Date:  08/10/2013   Anticipated DC Plan:  [place]  CM consult      [place]   Choice offered to / List presented to:  C-1 Patient        [place] arranged  HH-1 RN  [place].   Status of service:  Completed, signed off Medicare Important Message given?   (If response is "NO", the following Medicare IM given date fields will be blank) Date Medicare IM given:   Date Additional Medicare IM given:    Discharge Disposition:    Per UR Regulation:  Reviewed for med. necessity/level of care/duration of stay  If discussed at [place] of Stay Meetings, dates discussed:    Comments:  08/10/13 [employer], RN, BSN, [employer] [phone] Spoke with pt. regarding discharge planning. CM offered pt. list of home health agencies. Pt. chose [facility] to render services of RN and PT. Janae Sauce of [facility] notified. Pt asked for portable O2 tank; NCM called to set up and instructed that pt needs to contact [facility] office for change.  Pt notified.  1/20 1405 debbie dowell rn,bsn pt in w chf. will moniter for dc needs as pt progresses.

## 2013-08-08 DIAGNOSIS — N183 Chronic kidney disease, stage 3 unspecified: Secondary | ICD-10-CM

## 2013-08-08 LAB — BASIC METABOLIC PANEL
BUN: 30 mg/dL — AB (ref 6–23)
CHLORIDE: 99 meq/L (ref 96–112)
CO2: 33 mEq/L — ABNORMAL HIGH (ref 19–32)
Calcium: 8.7 mg/dL (ref 8.4–10.5)
Creatinine, Ser: 1.68 mg/dL — ABNORMAL HIGH (ref 0.50–1.35)
GFR calc non Af Amer: 37 mL/min — ABNORMAL LOW (ref >90)
GFR, EST AFRICAN AMERICAN: 43 mL/min — AB (ref >90)
GLUCOSE: 109 mg/dL — AB (ref 70–99)
POTASSIUM: 4.5 meq/L (ref 3.7–5.3)
Sodium: 143 mEq/L (ref 137–147)

## 2013-08-08 LAB — CBC
HCT: 27.8 % — ABNORMAL LOW (ref 39.0–52.0)
HEMOGLOBIN: 8.8 g/dL — AB (ref 13.0–17.0)
MCH: 28.9 pg (ref 26.0–34.0)
MCHC: 31.7 g/dL (ref 30.0–36.0)
MCV: 91.1 fL (ref 78.0–100.0)
Platelets: 255 10*3/uL (ref 150–400)
RBC: 3.05 MIL/uL — AB (ref 4.22–5.81)
RDW: 15.1 % (ref 11.5–15.5)
WBC: 7 10*3/uL (ref 4.0–10.5)

## 2013-08-08 LAB — TROPONIN I: Troponin I: <0.3 ng/mL (ref <0.30)

## 2013-08-08 NOTE — Progress Notes (Signed)
Subjective:   80 y.o. male with history of morbid obesity, CAD s/p CABG 1995, NSTEMI with DESx2 in 12/2011, COPD with chronic respiratory failure on home O2, CKD stage III, HTN, recent GI bleed due to gastric ulcer. Admitted 1/20 for decompensated diastolic HF and increasing angina. Weight 292-313  Diuresing well. Negative 2.5L. Weight down 2 pounds. Cr stable 1.5-1.6 range. Troponins negative. No further CP. Breathing better.    Intake/Output Summary (Last 24 hours) at 08/08/13 1310 Last data filed at 08/08/13 1100  Gross per 24 hour  Intake   1100 ml  Output   3525 ml  Net  -2425 ml    Current meds: . aspirin EC  81 mg Oral Daily  . atorvastatin  80 mg Oral QHS  . clopidogrel  75 mg Oral Q breakfast  . fluticasone  2 puff Inhalation BID  . furosemide  80 mg Intravenous BID  . hydrALAZINE  25 mg Oral TID  . iron polysaccharides  150 mg Oral BID  . isosorbide mononitrate  30 mg Oral Daily  . montelukast  10 mg Oral QHS  . multivitamin with minerals  1 tablet Oral Daily  . nebivolol  10 mg Oral Daily  . pantoprazole  80 mg Oral BID  . potassium chloride SA  20 mEq Oral BID  . sodium chloride  3 mL Intravenous Q12H  . tiotropium  18 mcg Inhalation Daily   Infusions:     Objective:  Blood pressure 121/59, pulse 58, temperature 97.7 F (36.5 C), temperature source Oral, resp. rate 16, height 5\' 11"  (1.803 m), weight 138.6 kg (305 lb 8.9 oz), SpO2 100.00%. Weight change:   Physical Exam: Obese male in no acute distress on home O2  HEENT: normal  Neck: JVD difficult to assess looks in 7-8 range Cardiac: normal S1, S2; RRR; occasional ectopy, no murmur  Lungs: Markedly diminished BS bilateral bases, otherwise clear Abd: soft, rounded, nontender, no hepatomegaly  Ext: 1+ bilat LE edema  Skin: warm and dry  Neuro: CNs 2-12 intact, no focal abnormalities noted   Telemetry: SR 60s  Lab Results: Basic Metabolic Panel:  Recent Labs Lab 08/07/13 1415 08/08/13 0155    NA 143 143  K 4.4 4.5  CL 100 99  CO2 33* 33*  GLUCOSE 87 109*  BUN 29* 30*  CREATININE 1.56* 1.68*  CALCIUM 9.0 8.7   Liver Function Tests:  Recent Labs Lab 08/07/13 1415  AST 19  ALT 17  ALKPHOS 79  BILITOT 0.3  PROT 6.3  ALBUMIN 3.3*   No results found for this basename: LIPASE, AMYLASE,  in the last 168 hours No results found for this basename: AMMONIA,  in the last 168 hours CBC:  Recent Labs Lab 08/07/13 1415 08/08/13 0155  WBC 7.5 7.0  NEUTROABS 4.5  --   HGB 8.7* 8.8*  HCT 27.9* 27.8*  MCV 91.8 91.1  PLT 259 255   Cardiac Enzymes:  Recent Labs Lab 08/07/13 1415 08/07/13 1935 08/08/13 0155  TROPONINI <0.30 <0.30 <0.30   BNP: No components found with this basename: POCBNP,  CBG: No results found for this basename: GLUCAP,  in the last 168 hours Microbiology: No results found for this basename: cult   No results found for this basename: CULT, SDES,  in the last 168 hours  Imaging: Dg Chest 2 View  08/07/2013   CLINICAL DATA:  Short of breath with dizziness.  CHF and COPD.  EXAM: CHEST  2 VIEW  COMPARISON:  DG  CHEST 1V PORT dated 07/11/2013; DG CHEST 2 VIEW dated 03/10/2004  FINDINGS: Both views degraded by patient body habitus and positioning. Prior median sternotomy. Cardiomegaly, accentuated by low lung volumes. No pleural effusion or pneumothorax. Chronic interstitial thickening, without overt congestive failure. No lobar consolidation.  IMPRESSION: Cardiomegaly and chronic interstitial thickening. No convincing evidence of acute disease.  Both views are degraded, especially the lateral.   Electronically Signed   By: Abigail Miyamoto M.D.   On: 08/07/2013 23:16     ASSESSMENT:  1. Acute on chronic diastolic CHF 2. CAD s/p CABG 1995, DESx2 in 12/2011, with recently increased CP 3. COPD with chronic respiratory failure on home O2 4. Probable CKD stage III 5. HTN 6. Recent ABL anemia with gastric ulcer 7. Morbid obesity Body mass index is 43.67  kg/(m^2).   PLAN/DISCUSSION:  Diuresing well. Will continue IV lasix as renal function tolerates. CP improved with lasix and troponin normal so suspect CP may be related to volume overload. With renal dysfunntion will continue to manage medically. If CP recurs or he develops exertional angina will need cath. Can transfer to tele. Cardiac rehab to see for ambulation.    Glori Bickers, MD 08/08/2013, 1:10 PM

## 2013-08-09 DIAGNOSIS — E669 Obesity, unspecified: Secondary | ICD-10-CM

## 2013-08-09 DIAGNOSIS — I25810 Atherosclerosis of coronary artery bypass graft(s) without angina pectoris: Secondary | ICD-10-CM

## 2013-08-09 DIAGNOSIS — I5032 Chronic diastolic (congestive) heart failure: Secondary | ICD-10-CM

## 2013-08-09 DIAGNOSIS — I2510 Atherosclerotic heart disease of native coronary artery without angina pectoris: Secondary | ICD-10-CM

## 2013-08-09 DIAGNOSIS — E119 Type 2 diabetes mellitus without complications: Secondary | ICD-10-CM

## 2013-08-09 LAB — BASIC METABOLIC PANEL
BUN: 28 mg/dL — ABNORMAL HIGH (ref 6–23)
CO2: 37 mEq/L — ABNORMAL HIGH (ref 19–32)
Calcium: 8.7 mg/dL (ref 8.4–10.5)
Chloride: 97 mEq/L (ref 96–112)
Creatinine, Ser: 1.6 mg/dL — ABNORMAL HIGH (ref 0.50–1.35)
GFR calc Af Amer: 45 mL/min — ABNORMAL LOW (ref >90)
GFR calc non Af Amer: 39 mL/min — ABNORMAL LOW (ref >90)
GLUCOSE: 94 mg/dL (ref 70–99)
POTASSIUM: 4.3 meq/L (ref 3.7–5.3)
Sodium: 144 mEq/L (ref 137–147)

## 2013-08-09 NOTE — Progress Notes (Signed)
Patient Name: [name] Date of Encounter: 08/09/2013   Principal Problem:   Acute on chronic diastolic CHF (congestive heart failure) Active Problems:   OBESITY   Type 2 diabetes mellitus   CAD (coronary artery disease)   HTN (hypertension)   CKD (chronic kidney disease), stage III   HLD (hyperlipidemia)   COPD (chronic obstructive pulmonary disease)   Anemia  SUBJECTIVE  Breathing better but not at baseline.  Edema and abd girth less.  Hopes to go home tomorrow.  CURRENT MEDS . aspirin EC  81 mg Oral Daily  . atorvastatin  80 mg Oral QHS  . clopidogrel  75 mg Oral Q breakfast  . fluticasone  2 puff Inhalation BID  . furosemide  80 mg Intravenous BID  . hydrALAZINE  25 mg Oral TID  . iron polysaccharides  150 mg Oral BID  . isosorbide mononitrate  30 mg Oral Daily  . montelukast  10 mg Oral QHS  . multivitamin with minerals  1 tablet Oral Daily  . nebivolol  10 mg Oral Daily  . pantoprazole  80 mg Oral BID  . potassium chloride SA  20 mEq Oral BID  . sodium chloride  3 mL Intravenous Q12H  . tiotropium  18 mcg Inhalation Daily    OBJECTIVE  Filed Vitals:   08/09/13 0159 08/09/13 0618 08/09/13 0831 08/09/13 1004  BP: 113/76 129/66  154/78  Pulse: 69 71  76  Temp: 98.1 F (36.7 C) 97.9 F (36.6 C)    TempSrc: Oral Oral    Resp: 18 18    Height:      Weight:  299 lb 11.2 oz (135.943 kg)    SpO2: 96% 100% 98%     Intake/Output Summary (Last 24 hours) at 08/09/13 1237 Last data filed at 08/09/13 1016  Gross per 24 hour  Intake    560 ml  Output   3400 ml  Net  -2840 ml   Filed Weights   08/08/13 0500 08/08/13 1900 08/09/13 0618  Weight: 305 lb 8.9 oz (138.6 kg) 302 lb 1.6 oz (137.032 kg) 299 lb 11.2 oz (135.943 kg)    PHYSICAL EXAM  General: Pleasant, NAD. Neuro: Alert and oriented X 3. Moves all extremities spontaneously. Psych: Normal affect. HEENT:  Normal x HOH.  Neck: Supple without bruits.  Mod elev jvp - somewhat difficult to assess  2/2 girth. Lungs:  Resp regular and unlabored, CTA. Heart: RRR no s3, s4, or murmurs. Abdomen: firm, non-tender, non-distended, BS + x 4.  Extremities: No clubbing, cyanosis.  1+ bilat LE edema. DP/PT/Radials 1+ and equal bilaterally.  Accessory Clinical Findings  CBC  Recent Labs  08/07/13 1415 08/08/13 0155  WBC 7.5 7.0  NEUTROABS 4.5  --   HGB 8.7* 8.8*  HCT 27.9* 27.8*  MCV 91.8 91.1  PLT 259 789   Basic Metabolic Panel  Recent Labs  08/08/13 0155 08/09/13 0328  NA 143 144  K 4.5 4.3  CL 99 97  CO2 33* 37*  GLUCOSE 109* 94  BUN 30* 28*  CREATININE 1.68* 1.60*  CALCIUM 8.7 8.7   Liver Function Tests  Recent Labs  08/07/13 1415  AST 19  ALT 17  ALKPHOS 79  BILITOT 0.3  PROT 6.3  ALBUMIN 3.3*   Cardiac Enzymes  Recent Labs  08/07/13 1415 08/07/13 1935 08/08/13 0155  TROPONINI <0.30 <0.30 <0.30   Thyroid Function Tests  Recent Labs  08/07/13 1415  TSH 2.745   TELE  Rsr,  intermittent wenckebach.  Radiology/Studies  Dg Chest 2 View  08/07/2013   CLINICAL DATA:  Short of breath with dizziness.  CHF and COPD.  EXAM: CHEST  2 VIEW  COMPARISON:  DG CHEST 1V PORT dated 07/11/2013; DG CHEST 2 VIEW dated 03/10/2004  FINDINGS: Both views degraded by patient body habitus and positioning. Prior median sternotomy. Cardiomegaly, accentuated by low lung volumes. No pleural effusion or pneumothorax. Chronic interstitial thickening, without overt congestive failure. No lobar consolidation.  IMPRESSION: Cardiomegaly and chronic interstitial thickening. No convincing evidence of acute disease.  Both views are degraded, especially the lateral.   Electronically Signed   By: Abigail Miyamoto M.D.   On: 08/07/2013 23:16   2D Echocardiogram 1.20.2015  Study Conclusions  - Left ventricle: The cavity size was normal. There was moderate concentric hypertrophy. Systolic function was normal. The estimated ejection fraction was in the range of 50% to 55%. Wall motion was  normal; there were no regional wall motion abnormalities. The pulmonary vein flow pattern was normal. Left ventricular diastolic function parameters were normal. - Mitral valve: Transvalvular velocity was within the normal range. There was no evidence for stenosis. No regurgitation. - Left atrium: The atrium was severely dilated. - Right ventricle: Right ventricle is poorly visualized but systolic function is at least midly impaired. The cavity size was mildly dilated. Wall thickness was normal. Systolic function was mildly to moderately reduced. - Tricuspid valve: No regurgitation. - Pulmonic valve: No regurgitation. _____________   ASSESSMENT AND PLAN  1.  Acute on chronic diastolic chf:   Nl EF by echo on 1/20.  Weight down 6 lbs since admission.  -6.5 Liters so far.  Weight previously as low as 295 in December.  Hr/bp relatively stable.  Creat stable.  Cont aggressive IV diuresis with lasix 80 bid.  Cont bb/hydral/nitrate.   2.  CAD:  No further chest pain.  CE negative.  Cont asa, plavix, bb, statin, nitrate.  Cont med Rx in setting of CKD and resolution of Ss.  If he develops recurrent angina despite volume improvement, we will have to give serious consideration to diagnostic cath.  3.  CKD III:  Stable.  Follow.  4.  HTN: stable.    5.  HL:  Cont statin.    6.  Normocytic anemia:  Stable.  Signed, Murray Hodgkins NP  Personally seen and examined. Agree with above. Continue with IV lasix. Agree that CP likely from volume overload. If returns..cath. I told him that we can't promise home tomorrow but will reassess in AM.   Candee Furbish, MD

## 2013-08-09 NOTE — Progress Notes (Signed)
CARDIAC REHAB PHASE I   PRE:  Rate/Rhythm: 82SR  BP:  Supine:   Sitting: 104/50  Standing:    SaO2: 92%2L  MODE:  Ambulation: 200 ft   POST:  Rate/Rhythm: 90SR  BP:  Supine:   Sitting: 120/60  Standing:    SaO2: 88%2L, 90% with rest 1335-1400 Pt walked 200 ft on 2L with rolling walker and asst x 2. Gait fairly steady. Becomes shakey at times and has to stop. Right hand shook at end of walk and had to rest standing prior to going to recliner. Tolerated well otherwise. Motivated to walk. Desat on 2L.  Call bell in reach.   Graylon Good, RN BSN  08/09/2013 1:54 PM

## 2013-08-09 NOTE — Progress Notes (Signed)
Spoke at length with [name] regarding heart failure and his understanding of it.  He lives at home and his son currently resides with him.  His son is frequently gone as he is in school and has 2 jobs.  I gave him the Living Better with Heart Failure Book and we reviewed the importance of daily weights, signs and symptoms of heart failure,  when to call the physician, and low sodium diet.  He does acknowledge that he weighs daily at home.  He says that he has trouble at times getting medications and that he may skip a day or two before he can get them from the pharmacy.  I reinforced the importance of not skipping any days and taking medications as prescribed.  I will check with care management and social work to see if he qualifies for any med assistance.  I will see him again prior to discharge to reinforce education.  Carole Binning RN, BSN, PCCN--Heart Failure Navigator

## 2013-08-09 NOTE — Progress Notes (Signed)
Pt a/o, no c/o pain, pt oob to BR, pt ambulated in hallway with assist x 1 and walker, pt ambulated with O2 100 ft, pt tolerated well and stated "thats the farthest I've walked in a while", vss, pt stable

## 2013-08-09 NOTE — Progress Notes (Signed)
Patient evaluated for community based chronic disease management services with [facility] as a benefit of patient's [employer]. Spoke with patient at bedside to explain [facility] services. Written consents obtained.  Patient has a scale and weighs at home but can not clearly express the steps of his acute symptom management plan.  He also expressed that his office visits copays of $50-$65 are making him apprehensive about going to appointments unless he is not feeling well. He also pays $400-$500 per month for medications.  He uses [facility] [address].  [facility] will request a review of his plan from the [facility] liaison and provide a pharmacist referral to assess medication cost reduction opportunities.  Patient will receive a post discharge transition of care call and will be evaluated for monthly home visits for assessments and disease process education.  Left contact information and [facility] literature at bedside. Made Inpatient Case Manager aware that [facility] following. Of note, [facility] Care Management services does not replace or interfere with any services that are arranged by inpatient case management or social work.  For additional questions or referrals please contact Corliss Blacker BSN RN [facility] Liaison at [phone].

## 2013-08-10 ENCOUNTER — Other Ambulatory Visit: Payer: Self-pay | Admitting: Physician Assistant

## 2013-08-10 ENCOUNTER — Encounter (HOSPITAL_COMMUNITY): Payer: Self-pay | Admitting: Physician Assistant

## 2013-08-10 ENCOUNTER — Telehealth: Payer: Self-pay | Admitting: Cardiovascular Disease

## 2013-08-10 DIAGNOSIS — D62 Acute posthemorrhagic anemia: Secondary | ICD-10-CM

## 2013-08-10 DIAGNOSIS — K922 Gastrointestinal hemorrhage, unspecified: Secondary | ICD-10-CM | POA: Insufficient documentation

## 2013-08-10 DIAGNOSIS — N183 Chronic kidney disease, stage 3 unspecified: Secondary | ICD-10-CM

## 2013-08-10 DIAGNOSIS — J449 Chronic obstructive pulmonary disease, unspecified: Secondary | ICD-10-CM

## 2013-08-10 LAB — BASIC METABOLIC PANEL
BUN: 28 mg/dL — ABNORMAL HIGH (ref 6–23)
CALCIUM: 8.7 mg/dL (ref 8.4–10.5)
CO2: 37 meq/L — AB (ref 19–32)
Chloride: 96 mEq/L (ref 96–112)
Creatinine, Ser: 1.7 mg/dL — ABNORMAL HIGH (ref 0.50–1.35)
GFR calc Af Amer: 42 mL/min — ABNORMAL LOW (ref >90)
GFR, EST NON AFRICAN AMERICAN: 36 mL/min — AB (ref >90)
Glucose, Bld: 91 mg/dL (ref 70–99)
Potassium: 4.1 mEq/L (ref 3.7–5.3)
Sodium: 144 mEq/L (ref 137–147)

## 2013-08-10 MED ORDER — ASPIRIN 81 MG PO TABS: 81.0000 mg | ORAL_TABLET | Freq: Every day | ORAL | Status: AC

## 2013-08-10 MED ORDER — PANTOPRAZOLE SODIUM 40 MG PO TBEC: 80.0000 mg | DELAYED_RELEASE_TABLET | Freq: Two times a day (BID) | ORAL | Status: DC

## 2013-08-10 MED ORDER — MECLIZINE HCL 25 MG PO CHEW: 1.0000 | CHEWABLE_TABLET | Freq: Two times a day (BID) | ORAL | Status: DC | PRN

## 2013-08-10 MED ORDER — FUROSEMIDE 40 MG PO TABS: 80.0000 mg | ORAL_TABLET | Freq: Two times a day (BID) | ORAL | Status: DC

## 2013-08-10 NOTE — Telephone Encounter (Signed)
New message      TCM appt on 08-16-13 per Hinton Dyer

## 2013-08-10 NOTE — Progress Notes (Signed)
1000-1030 Cardiac Rehab Completed CHF education with pt. We discussed signs and symptoms of CHF, CHF zones, fluid restrictions, low sodium diet, when to call MD and 911. Pt voices understanding. Pt declines Outpt. CRP due to no transportation. Pt has CHF packet. I put CHF video on for him to watch. Deon Pilling, RN 08/10/2013 11:14 AM

## 2013-08-10 NOTE — Plan of Care (Signed)
Problem: Phase I Progression Outcomes Goal: EF % per last Echo/documented,Core Reminder form on chart Outcome: Completed/Met Date Met:  08/10/13 EF 50 - 55% from 08/07/2013.

## 2013-08-10 NOTE — Discharge Summary (Signed)
Discharge Summary   Patient ID: [name] MRN: [id number], DOB/AGE: 78/04/1931 78 y.o. Admit date: 08/07/2013 D/C date:     08/10/2013  Primary Care Provider:  Melinda Crutch, MD Primary Cardiologist: Previously Dr. Verl Blalock, should likely be assigned to Dr. Acie Fredrickson now (since he saw him with PA in clinic on day he was admitted from office)  Primary Discharge Diagnoses:  1. Acute on chronic diastolic CHF - adm weight 313 office/307 hospital, dc weight 295 lbs 2. CAD - s/p CABG 1995, NSTEMI 12/2011 s/p DESx2 to RCA, VG-RI 3. COPD with chronic respiratory failure on home O2  4. CKD stage III  5. HTN  6. Recent ABL anemia with gastric ulcer 06/2013, relatively stable - f/u hemoccult early January was negative 7. Morbid obesity BMI 41.26 kg/(m^2).  Secondary Discharge Diagnoses:  1. HLD 2. Hard of hearing 3. Asthma 4. Sleep apnea 5. Chronic orthopnea - Has been sleeping in a recliner x 30 yrs 6. Hypothyroidism - euthyroid this admission 7. GERD 8. Arthritis  Hospital Course: [name] is an 80 y/o M with history of CAD s/p CABG 1995, NSTEMI with DESx2 in 12/2011, COPD with chronic respiratory failure on home O2, probable CKD stage III, HTN, chronic diastolic CHF who presented to clinic 08/07/2013 for a post-hospital followup appointment following December admission for GIB. He was hospitalized in early December with COPD exacerbation (TnI neg). He returned to the hospital around Christmas with melena and ABL anemia. He required 2 units PRBC for Hgb into the 7 range. EGD confirmed a prepyloric ulcer with path negative for malignancy. Hospital course was complicated by renal insufficiency - dc Cr 1.62 but he was felt to likely have CKD stage III as Cr in 2013 ran 1.3-1.7. Aspirin and Plavix were held temporarily then able to be restarted. The office visit was originally arranged to discuss whether or not we should stop Plavix.  When seen in the office on 08/07/13, he had no further signs of GIB but  was reporting worsening weakness, abdominal distention, SOB/DOE, daily chest pain with exertion (no rest pain), and weight gain of 16-19lbs. He had self-titrated Lasix to 3 times daily without effect. He reported drinking a significant amount of fluid daily - 4-6 bottles of water/juice daily. On exam he was markedly volume overloaded. He was sent [facility] to [facility] for direct admission. He was placed on IV diuresis with careful attention to renal function (adm Cr 1.56, discharge 1.70). Hgb was slightly decreased to 8.7 (compared to 9.7 on 07/26/13), but remained stable in this range. TSH was wnl. He had a CXR the day of admission but this was performed several hours after his initial IV lasix dose - this showed chronic interstitial thickening but no acute disease. 2D Echo showed EF 50-55%, mod LVH, severely dilated LA, RV poorly visualized with mild-mod reduced systolic function. Troponins remained negative x 3. His angina resolved as he was diuresed and continued medical therapy for CAD was recommended. Dr. Marlou Porch recommended to continue Plavix for now as he wasn't demonstrating any further signs of bleeding. ASA was decreased to 81mg  daily. Omeprazole was changed to equivalent dose of Protonix given Plavix use. Adm wt was 313 in the office/307 in the hospital, 295 on day of discharge; -8L net diuresis. He was counseled on fluid restriction and daily weights. Today he is feeling better. Dr. Marlou Porch has seen and examined the patient today and feels he is stable for discharge. Dr. Marlou Porch recommends home Lasix dose of 80mg  BID.  ARB was held on admission due to need for blood pressure room to diurese. As Cr has bumped mildly with diuresis, we will continue to hold at discharge - could consider resumption at lower dose as outpatient. He will have TOC visit in 1 week with a BMET as well as CBC to ensure that Hgb continues to remain stable. [name] care management was arranged. He may benefit from referral to nephrology given  recent diagnosis of probable CKD stage III.   Discharge Vitals: Blood pressure 132/60, pulse 65, temperature 98 F (36.7 C), temperature source Oral, resp. rate 18, height 5\' 11"  (1.803 m), weight 295 lb 11.2 oz (134.129 kg), SpO2 100.00%.  Labs: Lab Results  Component Value Date   WBC 7.0 08/08/2013   HGB 8.8* 08/08/2013   HCT 27.8* 08/08/2013   MCV 91.1 08/08/2013   PLT 255 08/08/2013    Recent Labs Lab 08/07/13 1415  08/10/13 0354  NA 143  < > 144  K 4.4  < > 4.1  CL 100  < > 96  CO2 33*  < > 37*  BUN 29*  < > 28*  CREATININE 1.56*  < > 1.70*  CALCIUM 9.0  < > 8.7  PROT 6.3  --   --   BILITOT 0.3  --   --   ALKPHOS 79  --   --   ALT 17  --   --   AST 19  --   --   GLUCOSE 87  < > 91  < > = values in this interval not displayed.  Recent Labs  08/07/13 1415 08/07/13 1935 08/08/13 0155  TROPONINI <0.30 <0.30 <0.30   Diagnostic Studies/Procedures    2D Echo 08/07/13 - Left ventricle: The cavity size was normal. There was moderate concentric hypertrophy. Systolic function was normal. The estimated ejection fraction was in the range of 50% to 55%. Wall motion was normal; there were no regional wall motion abnormalities. The pulmonary vein flow pattern was normal. Left ventricular diastolic function parameters were normal. - Mitral valve: Transvalvular velocity was within the normal range. There was no evidence for stenosis. No Regurgitation. - Left atrium: The atrium was severely dilated. - Right ventricle: Right ventricle is poorly visualized but systolic function is at least midly impaired. The cavity size was mildly dilated. Wall thickness was normal. Systolic function was mildly to moderately reduced. - Tricuspid valve: No regurgitation. - Pulmonic valve: No regurgitation.   Dg Chest 2 View 08/07/2013   CLINICAL DATA:  Short of breath with dizziness.  CHF and COPD.  EXAM: CHEST  2 VIEW  COMPARISON:  DG CHEST 1V PORT dated 07/11/2013; DG CHEST 2 VIEW dated  03/10/2004  FINDINGS: Both views degraded by patient body habitus and positioning. Prior median sternotomy. Cardiomegaly, accentuated by low lung volumes. No pleural effusion or pneumothorax. Chronic interstitial thickening, without overt congestive failure. No lobar consolidation.  IMPRESSION: Cardiomegaly and chronic interstitial thickening. No convincing evidence of acute disease.  Both views are degraded, especially the lateral.   Electronically Signed   By: Abigail Miyamoto M.D.   On: 08/07/2013 23:16    Discharge Medications     Medication List    STOP taking these medications       losartan 100 MG tablet  Commonly known as:  COZAAR     omeprazole 20 MG capsule  Commonly known as:  PRILOSEC  Replaced by:  pantoprazole 40 MG tablet      TAKE these medications  albuterol 108 (90 BASE) MCG/ACT inhaler  Commonly known as:  PROVENTIL HFA;VENTOLIN HFA  Inhale 2 puffs into the lungs every 6 (six) hours as needed for wheezing or shortness of breath.     albuterol (2.5 MG/3ML) 0.083% nebulizer solution  Commonly known as:  PROVENTIL  Take 3 mLs (2.5 mg total) by nebulization every 6 (six) hours as needed for wheezing.     ARTIFICIAL TEAR OP  Place 2 drops into both eyes daily as needed. For dry eyes     aspirin 81 MG tablet  Take 1 tablet (81 mg total) by mouth daily.     atorvastatin 80 MG tablet  Commonly known as:  LIPITOR  Take 80 mg by mouth at bedtime.     clopidogrel 75 MG tablet  Commonly known as:  PLAVIX  Take 1 tablet (75 mg total) by mouth daily with breakfast.     fluticasone 110 MCG/ACT inhaler  Commonly known as:  FLOVENT HFA  Inhale 2 puffs into the lungs 2 (two) times daily.     furosemide 40 MG tablet  Commonly known as:  LASIX  Take 2 tablets (80 mg total) by mouth 2 (two) times daily.     hydrALAZINE 25 MG tablet  Commonly known as:  APRESOLINE  Take 25 mg by mouth 3 (three) times daily.     isosorbide mononitrate 30 MG 24 hr tablet  Commonly  known as:  IMDUR  Take 30 mg by mouth daily.     Meclizine HCl 25 MG Chew  Commonly known as:  BONINE  Chew 1 tablet (25 mg total) by mouth 2 (two) times daily as needed (for vertigo).     montelukast 10 MG tablet  Commonly known as:  SINGULAIR  Take 10 mg by mouth at bedtime.     MULTIVITAMIN & MINERAL PO  Take 1 tablet by mouth daily.     nebivolol 10 MG tablet  Commonly known as:  BYSTOLIC  Take 1 tablet (10 mg total) by mouth daily.     nitroGLYCERIN 0.4 MG SL tablet  Commonly known as:  NITROSTAT  Place 0.4 mg under the tongue every 5 (five) minutes as needed for chest pain.     NU-IRON 150 MG capsule  Generic drug:  iron polysaccharides  Take 150 mg by mouth 2 (two) times daily.     pantoprazole 40 MG tablet  Commonly known as:  PROTONIX  Take 2 tablets (80 mg total) by mouth 2 (two) times daily.     potassium chloride SA 20 MEQ tablet  Commonly known as:  K-DUR,KLOR-CON  Take 20 mEq by mouth 2 (two) times daily.     tiotropium 18 MCG inhalation capsule  Commonly known as:  SPIRIVA  Place 1 capsule (18 mcg total) into inhaler and inhale daily.        Disposition   The patient will be discharged in stable condition to home. Discharge Orders   Future Appointments Provider Department Dept Phone   08/16/2013 10:35 AM [facility] [place] Office [phone]   08/16/2013 2:40 PM Liliane Shi, PA-C [facility] [phone]   Future Orders Complete By Expires   Diet - low sodium heart healthy  As directed    Discharge instructions  As directed    Comments:     Some studies suggest Prilosec/Omeprazole can interact with Plavix. We changed your Prilosec/Omeprazole to Protonix for less chance of interaction. Continue Plavix. Call your doctor if you  have any unusual bleeding or dark stools.   For patients with congestive heart failure, we give them these special instructions:  1. Follow a low-salt diet and watch your fluid  intake. In general, you should not be taking in more than 2 liters of fluid per day (no more than 8 eight-ounce glasses per day). Some patients are restricted to less than 1.5 liters of fluid per day (no more than 6 glasses per day). This includes sources of water in foods like soup, coffee, tea, milk, etc. 2. Weigh yourself on the same scale at same time of day and keep a log. 3. Call your doctor: (Anytime you feel any of the following symptoms)  - 3-4 pound weight gain in 1-2 days or 2 pounds overnight  - Shortness of breath, with or without a dry hacking cough  - Swelling in the hands, feet or stomach  - If you have to sleep on extra pillows at night in order to breathe  IT IS IMPORTANT TO LET YOUR DOCTOR KNOW EARLY ON IF YOU ARE HAVING SYMPTOMS SO WE CAN HELP YOU!   Increase activity slowly  As directed      Follow-up Information   Follow up with Richardson Dopp, PA-C. ([facility] - 08/16/13 at 2:40pm with labwork after your appointment)    Specialty:  Physician Assistant   Contact information:   [id number] [address] [phone]         Duration of Discharge Encounter: Greater than 30 minutes including physician and PA time.  Signed, Melina Copa PA-C 08/10/2013, 8:47 AM

## 2013-08-10 NOTE — Progress Notes (Addendum)
    Subjective:  Feels better. Less edema, SOB.   Objective:  Vital Signs in the last 24 hours: Temp:  [97 F (36.1 C)-98 F (36.7 C)] 98 F (36.7 C) (01/23 0456) Pulse Rate:  [65-76] 65 (01/23 0456) Resp:  [18] 18 (01/23 0456) BP: (113-154)/(58-88) 132/60 mmHg (01/23 0456) SpO2:  [98 %-100 %] 100 % (01/23 0456) Weight:  [295 lb 11.2 oz (134.129 kg)] 295 lb 11.2 oz (134.129 kg) (01/23 0456)  Intake/Output from previous day: 01/22 0701 - 01/23 0700 In: 1038 [P.O.:1038] Out: 3325 [Urine:3325]   Physical Exam: General: Well developed, well nourished, in no acute distress. Head:  Normocephalic and atraumatic. Lungs: Clear to auscultation and percussion. Faint end exp wheeze right base Heart: Normal S1 and S2.  No murmur, rubs or gallops.  Abdomen: soft, non-tender, positive bowel sounds. Obese Extremities: No clubbing or cyanosis. No edema. Neurologic: Alert and oriented x 3.    Lab Results:  Recent Labs  08/07/13 1415 08/08/13 0155  WBC 7.5 7.0  HGB 8.7* 8.8*  PLT 259 255    Recent Labs  08/09/13 0328 08/10/13 0354  NA 144 144  K 4.3 4.1  CL 97 96  CO2 37* 37*  GLUCOSE 94 91  BUN 28* 28*  CREATININE 1.60* 1.70*    Recent Labs  08/07/13 1935 08/08/13 0155  TROPONINI <0.30 <0.30   Hepatic Function Panel  Recent Labs  08/07/13 1415  PROT 6.3  ALBUMIN 3.3*  AST 19  ALT 17  ALKPHOS 79  BILITOT 0.3   Telemetry: NSR/ SB Personally viewed.    Cardiac Studies:    Assessment/Plan:  Principal Problem:   Acute on chronic diastolic CHF (congestive heart failure) Active Problems:   OBESITY   Type 2 diabetes mellitus   CAD (coronary artery disease)   HTN (hypertension)   HLD (hyperlipidemia)   COPD (chronic obstructive pulmonary disease)   CKD (chronic kidney disease), stage III  80 y.o. male with history of morbid obesity, CAD s/p CABG 1995, NSTEMI with DESx2 in 12/2011, COPD with chronic respiratory failure on home O2, CKD stage III, HTN,  recent GI bleed due to gastric ulcer. Admitted 1/20 for decompensated diastolic HF EF 81% and increasing angina. Weight 292-313  1) Acute on chronic diastolic HF  - OK for DC today  - Lasix change from 40 TID at home to 80 BID  - Low salt/fluid diet  - TCM follow up with Dr. Acie Fredrickson or APP in one week, check BMET then.   - DC weight 295 pounds.    2) CAD/angina  - no further CP after diuresis.   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    Allie Ousley, [place] 08/10/2013, 7:32 AM

## 2013-08-10 NOTE — Progress Notes (Signed)
I spoke again briefly with [name].  I reinforced heart failure teaching from yesterday.  He verbalizes understanding of signs and symptoms of heart failure, when to call the physician, importance of daily weights, a low sodium diet and taking medications as prescribed.  He plans on discharge today.  [name] has an appointment with [facility] on 08/16/13.  He is scheduled to have [place] services at discharge.  Carole Binning RN, BSN, PCCN--Heart Failure Navigator

## 2013-08-10 NOTE — Discharge Summary (Signed)
Please see prior note for full details. 35 minutes spent on discharge, patient instructions, lab work reviewed, medication consolidation.

## 2013-08-10 NOTE — Evaluation (Signed)
Physical Therapy Evaluation Patient Details Name: [name] MRN: [id number] DOB: [birth year] Today's Date: 08/10/2013 Time: 2683-4196 PT Time Calculation (min): 23 min  PT Assessment / Plan / Recommendation History of Present Illness  Pt admitted with acute on chronic diastolic CHF  Clinical Impression  D/c orders had been written, but PT eval was ordered before d/c to determine if HHPT was appropriate.  Recommend HHPT.  Pt has all DME.  Pt's o2 sats were 95% with gait on 2 L/min.  Will d/c from skilled PT due to pending d/c.    PT Assessment  Patent does not need any further PT services    Follow Up Recommendations  Home health PT    Does the patient have the potential to tolerate intense rehabilitation      Barriers to Discharge        Equipment Recommendations  None recommended by PT    Recommendations for Other Services     Frequency      Precautions / Restrictions Precautions Precaution Comments: oxygen dependent Restrictions Weight Bearing Restrictions: No   Pertinent Vitals/Pain 95% on 2 L/min with gait.      Mobility  Transfers Overall transfer level: Needs assistance Transfers: Sit to/from Stand Sit to Stand: Min guard General transfer comment: cues for proper hand placement. Poor eccentric control with stand to sit. Ambulation/Gait Ambulation/Gait assistance: Min guard Ambulation Distance (Feet): 150 Feet Assistive device: Rolling walker (2 wheeled) Gait Pattern/deviations: Drifts right/left;Step-through pattern General Gait Details: Tremors in hands at times during gait.  Pt would need to let go of RW and re-grasp.  o2 95% during gait on 2 L/min    Exercises     PT Diagnosis: Difficulty walking  PT Problem List: Decreased activity tolerance;Decreased mobility PT Treatment Interventions: Gait training     PT Goals(Current goals can be found in the care plan section) Acute Rehab PT Goals PT Goal Formulation: No goals set, d/c  therapy  Visit Information  Last PT Received On: 08/10/13 Assistance Needed: +1 History of Present Illness: Pt admitted with acute on chronic diastolic CHF       Prior Functioning  Home Living Family/patient expects to be discharged to:: Private residence Living Arrangements: Children Available Help at Discharge: Family;Neighbor Type of Home: Mobile home Home Access: Stairs to enter [employer] of Steps: 2 Entrance Stairs-Rails: Right;Left;Can reach both Home Layout: One level Home Equipment: Walker - 2 wheels;Cane - single point;Bedside commode Prior Function Level of Independence: Needs assistance Gait / Transfers Assistance Needed: A with stairs, cruises furniture in house and uses RW outside of house.   Communication Communication: No difficulties    Cognition  Cognition Arousal/Alertness: Awake/alert Behavior During Therapy: WFL for tasks assessed/performed Overall Cognitive Status: Within Functional Limits for tasks assessed    Extremity/Trunk Assessment Upper Extremity Assessment Upper Extremity Assessment: Overall WFL for tasks assessed Lower Extremity Assessment Lower Extremity Assessment: Overall WFL for tasks assessed Cervical / Trunk Assessment Cervical / Trunk Assessment: Normal   Balance Balance Overall balance assessment: No apparent balance deficits (not formally assessed)  End of Session PT - End of Session Equipment Utilized During Treatment: Gait belt Activity Tolerance: Patient tolerated treatment well Patient left: in chair Nurse Communication: Mobility status  GP     [name] 08/10/2013, 11:33 AM

## 2013-08-10 NOTE — Progress Notes (Signed)
Patient discharged to home with family on 2L of oxygen via nasal cannula.  IV removed prior to discharge; IV site clean, dry, and intact.  Discharge instructions discussed with patient at bedside.  Patient voiced understanding of discharge instructions, education, and medications.  Prescription sent electronically to patient's pharmacy.  [facility] set up for patient's oxygen delivery at home.

## 2013-08-13 NOTE — Telephone Encounter (Signed)
Left message for pt to call back to discuss any p/hosp needs.  Also left message of appt time

## 2013-08-14 NOTE — Telephone Encounter (Signed)
Called TCM pt.  Discharged 1/20.  States he is doing better every day.  Is able to walk some around house to get strength back.  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  Son or grandson is staying with him.  Denies SOB, CP.  Is taking medications as ordered. Is aware of appointment on 1/29 with Richardson Dopp and to get labs. Will call if has any further questions.

## 2013-08-16 ENCOUNTER — Ambulatory Visit (INDEPENDENT_AMBULATORY_CARE_PROVIDER_SITE_OTHER): Payer: Medicare Other | Admitting: Physician Assistant

## 2013-08-16 ENCOUNTER — Other Ambulatory Visit: Payer: Medicare Other

## 2013-08-16 ENCOUNTER — Encounter: Payer: Self-pay | Admitting: Physician Assistant

## 2013-08-16 VITALS — BP 120/70 | HR 75 | Ht 71.0 in | Wt 294.0 lb

## 2013-08-16 DIAGNOSIS — I252 Old myocardial infarction: Secondary | ICD-10-CM

## 2013-08-16 DIAGNOSIS — N183 Chronic kidney disease, stage 3 unspecified: Secondary | ICD-10-CM

## 2013-08-16 DIAGNOSIS — D62 Acute posthemorrhagic anemia: Secondary | ICD-10-CM

## 2013-08-16 DIAGNOSIS — D649 Anemia, unspecified: Secondary | ICD-10-CM

## 2013-08-16 DIAGNOSIS — I5032 Chronic diastolic (congestive) heart failure: Secondary | ICD-10-CM

## 2013-08-16 DIAGNOSIS — I2510 Atherosclerotic heart disease of native coronary artery without angina pectoris: Secondary | ICD-10-CM

## 2013-08-16 DIAGNOSIS — I10 Essential (primary) hypertension: Secondary | ICD-10-CM

## 2013-08-16 DIAGNOSIS — J449 Chronic obstructive pulmonary disease, unspecified: Secondary | ICD-10-CM

## 2013-08-16 DIAGNOSIS — E785 Hyperlipidemia, unspecified: Secondary | ICD-10-CM

## 2013-08-16 LAB — CBC WITH DIFFERENTIAL/PLATELET
Basophils Absolute: 0 10*3/uL (ref 0.0–0.1)
Basophils Relative: 0.4 % (ref 0.0–3.0)
EOS ABS: 0.2 10*3/uL (ref 0.0–0.7)
Eosinophils Relative: 2.1 % (ref 0.0–5.0)
HCT: 32.8 % — ABNORMAL LOW (ref 39.0–52.0)
HEMOGLOBIN: 10.4 g/dL — AB (ref 13.0–17.0)
LYMPHS ABS: 2.3 10*3/uL (ref 0.7–4.0)
LYMPHS PCT: 30.4 % (ref 12.0–46.0)
MCHC: 31.6 g/dL (ref 30.0–36.0)
MCV: 89.5 fl (ref 78.0–100.0)
MONO ABS: 0.7 10*3/uL (ref 0.1–1.0)
Monocytes Relative: 9.2 % (ref 3.0–12.0)
NEUTROS ABS: 4.3 10*3/uL (ref 1.4–7.7)
Neutrophils Relative %: 57.9 % (ref 43.0–77.0)
Platelets: 298 10*3/uL (ref 150.0–400.0)
RBC: 3.67 Mil/uL — ABNORMAL LOW (ref 4.22–5.81)
RDW: 16.6 % — ABNORMAL HIGH (ref 11.5–14.6)
WBC: 7.5 10*3/uL (ref 4.5–10.5)

## 2013-08-16 LAB — BASIC METABOLIC PANEL
BUN: 30 mg/dL — ABNORMAL HIGH (ref 6–23)
CHLORIDE: 98 meq/L (ref 96–112)
CO2: 36 meq/L — AB (ref 19–32)
Calcium: 8.8 mg/dL (ref 8.4–10.5)
Creatinine, Ser: 2.1 mg/dL — ABNORMAL HIGH (ref 0.4–1.5)
GFR: 32.56 mL/min — ABNORMAL LOW (ref >60.00)
GLUCOSE: 101 mg/dL — AB (ref 70–99)
Potassium: 4.1 mEq/L (ref 3.5–5.1)
SODIUM: 141 meq/L (ref 135–145)

## 2013-08-16 NOTE — Patient Instructions (Signed)
LAB WORK TODAY; BMET, CBC W/DIFF  PLEASE FOLLOW UP WITH SCOTT Blaine, [facility] ON 09/17/13 @ 10:10 AM  Your physician wants you to follow-up in: [place]. Acie Fredrickson. You will receive a reminder letter in the mail two months in advance. If you don't receive a letter, please call our office to schedule the follow-up appointment.

## 2013-08-16 NOTE — Progress Notes (Signed)
[address], [place], [place]  [address] Phone: [phone] Fax:  [phone]  Date:  08/16/2013   ID:  [name], DOB 1931/05/10, MRN [id number]  PCP:   Melinda Crutch, MD  Cardiologist:  Dr. Jenell Milliner => Dr. Liam Rogers     History of Present Illness: [name] is a 80 y.o. male with a hx CAD s/p CABG 1995, NSTEMI treated with DES x 2 in 12/2011, COPD with chronic respiratory failure on home O2, probable CKD stage III, HTN, chronic diastolic CHF.  2D Echo 10/26/11: normal LV, EF 55-60%, mod dilated LA, calcified right and noncoronary commisure.    He was admitted to the hospital in December for a GI bleed. He was hospitalized in early December with COPD exacerbation (TnI neg). He returned to the hospital around Christmas with melena and ABL anemia. He required 2 units PRBC for Hgb into the 7 range. EGD confirmed a prepyloric ulcer with path negative for malignancy. Hospital course was complicated by AKI (dc Cr 1.62). He likely has CKD as Cr in 2013 ran 1.3-1.7. Last Hgb on 07/26/13 was stable at 9.7 with negative FOBT. Aspirin and Plavix were held and restarted.    He was seen in the office for post hospital follow up on 08/07/13 and was volume overloaded (up 16 lbs) and complaining of chest pain with minimal activity.  He was admitted again [id number] with a/c diastolic CHF.   He was diuresed with IV Lasix.  Echo (08/07/2013):  Mod LVH, EF 50-55%, severe LaE, mild to mod reduced RVSF.  CEs were neg.  Chest pain resolved with diuresis.  He was kept on ASA and Plavix.  D/c weight 295.  Of note, ARB held at d/c due to low BP and AKI in setting of diuresis.    Since discharge, he is doing well. His breathing is overall improved. However, with COPD he continues with chronic NYHA class III dyspnea. He sleeps in a recliner chronically. He denies PND. LE edema is much improved. He denies chest pain. He denies syncope.  Recent Labs: 08/07/2013: ALT 17; Pro B Natriuretic peptide (BNP)  892.3*; TSH 2.745  08/08/2013: Hemoglobin 8.8*  08/10/2013: Creatinine 1.70*; Potassium 4.1   Wt Readings from Last 3 Encounters:  08/16/13 294 lb (133.358 kg)  08/10/13 295 lb 11.2 oz (134.129 kg)  08/07/13 313 lb (141.976 kg)     Past Medical History  Diagnosis Date  . CAD (coronary artery disease)     a. s/p CABG x 3 in 1995; b.  NSTEMI 12/2011 -> Cath 6/21: LM 20-30, LAD 90, RI 95, LCX 33m, RCA 90/133m (treated w/ 2.0x16 Promus DES), VG->RCA ok, VG->RI 70-64m (treated w/ 4.0x28 Promus), LIMA->LAD ok, EF 55-60%.  Marland Kitchen HTN (hypertension)   . HLD (hyperlipidemia)   . Morbid obesity   . Syncope   . COPD (chronic obstructive pulmonary disease)     a. on home O2  . HOH (hard of hearing)     left ear  . Asthma   . Sleep apnea   . Chronic diastolic CHF (congestive heart failure)     a. Echo 10/2011: EF 55-60%;  b. elevated EDP req diuresis.  . Chronic respiratory failure 07/11/2013    a. 3L home O2 24/7.  Marland Kitchen Orthopnea     a. Has been sleeping in a recliner x 30 yrs.  . Hypothyroidism   . GERD (gastroesophageal reflux disease)   . Arthritis     "knees; hands" (  07/11/2013)  . GI bleed     a. 06/2013 adm - prepyloric ulcer, path neg for malignancy. A/w ABL anemia.  . Acute blood loss anemia     a. 06/2013 due to GIB.  . CKD (chronic kidney disease)     a. ?Based on Cr 2013 - 1.3-1.7.    Current Outpatient Prescriptions  Medication Sig Dispense Refill  . albuterol (PROVENTIL HFA;VENTOLIN HFA) 108 (90 BASE) MCG/ACT inhaler Inhale 2 puffs into the lungs every 6 (six) hours as needed for wheezing or shortness of breath.      Marland Kitchen albuterol (PROVENTIL) (2.5 MG/3ML) 0.083% nebulizer solution Take 3 mLs (2.5 mg total) by nebulization every 6 (six) hours as needed for wheezing.  75 mL  12  . ARTIFICIAL TEAR OP Place 2 drops into both eyes daily as needed. For dry eyes      . aspirin 81 MG tablet Take 1 tablet (81 mg total) by mouth daily.      Marland Kitchen atorvastatin (LIPITOR) 80 MG tablet Take 80 mg  by mouth at bedtime.      . clopidogrel (PLAVIX) 75 MG tablet Take 1 tablet (75 mg total) by mouth daily with breakfast.      . fluticasone (FLOVENT HFA) 110 MCG/ACT inhaler Inhale 2 puffs into the lungs 2 (two) times daily.  1 Inhaler  12  . furosemide (LASIX) 40 MG tablet Take 2 tablets (80 mg total) by mouth 2 (two) times daily.  120 tablet  1  . hydrALAZINE (APRESOLINE) 25 MG tablet Take 25 mg by mouth 3 (three) times daily.       . iron polysaccharides (NU-IRON) 150 MG capsule Take 150 mg by mouth 2 (two) times daily.      . isosorbide mononitrate (IMDUR) 30 MG 24 hr tablet Take 30 mg by mouth daily.      . Meclizine HCl (BONINE) 25 MG CHEW Chew 1 tablet (25 mg total) by mouth 2 (two) times daily as needed (for vertigo).      . montelukast (SINGULAIR) 10 MG tablet Take 10 mg by mouth at bedtime.      . Multiple Vitamins-Minerals (MULTIVITAMIN & MINERAL PO) Take 1 tablet by mouth daily.        . nebivolol (BYSTOLIC) 10 MG tablet Take 1 tablet (10 mg total) by mouth daily.  30 tablet  8  . nitroGLYCERIN (NITROSTAT) 0.4 MG SL tablet Place 0.4 mg under the tongue every 5 (five) minutes as needed for chest pain.      . pantoprazole (PROTONIX) 40 MG tablet Take 2 tablets (80 mg total) by mouth 2 (two) times daily.  120 tablet  1  . potassium chloride SA (K-DUR,KLOR-CON) 20 MEQ tablet Take 20 mEq by mouth 2 (two) times daily.       Marland Kitchen tiotropium (SPIRIVA) 18 MCG inhalation capsule Place 1 capsule (18 mcg total) into inhaler and inhale daily.  30 capsule  12   No current facility-administered medications for this visit.    Allergies:   Other   Social History:  The patient  reports that he has never smoked. He has never used smokeless tobacco. He reports that he drinks alcohol. He reports that he does not use illicit drugs.   Family History:  The patient's family history includes CAD in an other family member.   ROS:  Please see the history of present illness.   He denies any melena or  hematochezia. He has had some mild epistaxis. He has a chronic  cough.   All other systems reviewed and negative.   PHYSICAL EXAM: VS:  BP 120/70  Pulse 75  Ht 5\' 11"  (1.803 m)  Wt 294 lb (133.358 kg)  BMI 41.02 kg/m2 Well nourished, well developed, in no acute distress HEENT: normal Neck: I cannot assess JVD Cardiac:  distant S1, S2; RRR; no murmur Lungs:  Decreased breath sounds bilaterally, dry bibasilar crackles that somewhat clear with cough Abd: soft, nontender, no hepatomegaly Ext: Very trace bilateral LE edema Skin: warm and dry Neuro:  CNs 2-12 intact, no focal abnormalities noted  EKG:  NSR, HR 75, poor R wave progression, nonspecific ST-T wave changes     ASSESSMENT AND PLAN:  1. Chronic Diastolic CHF:  Volume remains stable. Weight at home has continued to decline. Continue current therapy. Check a follow up basic metabolic panel today. 2. CAD, Status Post Prior MI:  No angina. He remains on aspirin, Plavix, statin. 3. Hx of UGI Bleed:  No further signs of bleeding. He remains on PPI therapy. Check a followup CBC today given dual antiplatelet therapy. 4. COPD:  He remains on continuous O2. He is chronically short of breath. 5. Chronic Kidney Disease:  Check a followup basic metabolic panel today.  Consider resuming losartan at a lower dose if his renal function remained stable (25 mg daily). 6. Hypertension:  Controlled. 7. Hyperlipidemia:  Continue statin. 8. Disposition:  Follow up with me in 3-4 weeks. Follow up with Dr. Acie Fredrickson in 3 months.  Signed, Richardson Dopp, PA-C  08/16/2013 2:42 PM

## 2013-08-17 ENCOUNTER — Telehealth: Payer: Self-pay | Admitting: *Deleted

## 2013-08-17 DIAGNOSIS — I5033 Acute on chronic diastolic (congestive) heart failure: Secondary | ICD-10-CM

## 2013-08-17 DIAGNOSIS — I10 Essential (primary) hypertension: Secondary | ICD-10-CM

## 2013-08-17 MED ORDER — FUROSEMIDE 40 MG PO TABS: 60.0000 mg | ORAL_TABLET | Freq: Two times a day (BID) | ORAL | Status: DC

## 2013-08-17 NOTE — Telephone Encounter (Signed)
pt notified about lab results and to decrease lasix to 60 mg BID, bmet 2/3 when he comes in for CVRR appt

## 2013-08-21 ENCOUNTER — Other Ambulatory Visit (INDEPENDENT_AMBULATORY_CARE_PROVIDER_SITE_OTHER): Payer: Medicare Other

## 2013-08-21 ENCOUNTER — Telehealth: Payer: Self-pay | Admitting: *Deleted

## 2013-08-21 DIAGNOSIS — I10 Essential (primary) hypertension: Secondary | ICD-10-CM

## 2013-08-21 DIAGNOSIS — I509 Heart failure, unspecified: Secondary | ICD-10-CM

## 2013-08-21 DIAGNOSIS — I5033 Acute on chronic diastolic (congestive) heart failure: Secondary | ICD-10-CM

## 2013-08-21 LAB — BASIC METABOLIC PANEL
BUN: 30 mg/dL — AB (ref 6–23)
CALCIUM: 9.3 mg/dL (ref 8.4–10.5)
CHLORIDE: 98 meq/L (ref 96–112)
CO2: 33 mEq/L — ABNORMAL HIGH (ref 19–32)
Creatinine, Ser: 1.9 mg/dL — ABNORMAL HIGH (ref 0.4–1.5)
GFR: 36.56 mL/min — ABNORMAL LOW (ref >60.00)
Glucose, Bld: 158 mg/dL — ABNORMAL HIGH (ref 70–99)
Potassium: 3.9 mEq/L (ref 3.5–5.1)
Sodium: 140 mEq/L (ref 135–145)

## 2013-08-21 NOTE — Telephone Encounter (Signed)
pt notified about lab results with verbal understanding  

## 2013-08-30 ENCOUNTER — Other Ambulatory Visit: Payer: Self-pay

## 2013-08-30 MED ORDER — CLOPIDOGREL BISULFATE 75 MG PO TABS: 75.0000 mg | ORAL_TABLET | Freq: Every day | ORAL | Status: DC

## 2013-08-30 MED ORDER — NITROGLYCERIN 0.4 MG SL SUBL: 0.4000 mg | SUBLINGUAL_TABLET | SUBLINGUAL | Status: DC | PRN

## 2013-09-04 ENCOUNTER — Other Ambulatory Visit: Payer: Self-pay

## 2013-09-04 MED ORDER — CLOPIDOGREL BISULFATE 75 MG PO TABS: 75.0000 mg | ORAL_TABLET | Freq: Every day | ORAL | Status: DC

## 2013-09-15 ENCOUNTER — Other Ambulatory Visit: Payer: Self-pay | Admitting: Cardiology

## 2013-09-17 ENCOUNTER — Encounter: Payer: Self-pay | Admitting: Physician Assistant

## 2013-09-17 ENCOUNTER — Ambulatory Visit (INDEPENDENT_AMBULATORY_CARE_PROVIDER_SITE_OTHER): Payer: Medicare Other | Admitting: Physician Assistant

## 2013-09-17 VITALS — BP 110/70 | HR 70 | Ht 71.0 in | Wt 297.0 lb

## 2013-09-17 DIAGNOSIS — R079 Chest pain, unspecified: Secondary | ICD-10-CM

## 2013-09-17 DIAGNOSIS — I10 Essential (primary) hypertension: Secondary | ICD-10-CM

## 2013-09-17 DIAGNOSIS — N183 Chronic kidney disease, stage 3 unspecified: Secondary | ICD-10-CM

## 2013-09-17 DIAGNOSIS — Z79899 Other long term (current) drug therapy: Secondary | ICD-10-CM

## 2013-09-17 DIAGNOSIS — I2510 Atherosclerotic heart disease of native coronary artery without angina pectoris: Secondary | ICD-10-CM

## 2013-09-17 DIAGNOSIS — R0602 Shortness of breath: Secondary | ICD-10-CM

## 2013-09-17 DIAGNOSIS — K279 Peptic ulcer, site unspecified, unspecified as acute or chronic, without hemorrhage or perforation: Secondary | ICD-10-CM

## 2013-09-17 DIAGNOSIS — I5032 Chronic diastolic (congestive) heart failure: Secondary | ICD-10-CM

## 2013-09-17 LAB — BASIC METABOLIC PANEL
BUN: 31 mg/dL — ABNORMAL HIGH (ref 6–23)
CALCIUM: 9.5 mg/dL (ref 8.4–10.5)
CO2: 36 mEq/L — ABNORMAL HIGH (ref 19–32)
Chloride: 98 mEq/L (ref 96–112)
Creatinine, Ser: 1.8 mg/dL — ABNORMAL HIGH (ref 0.4–1.5)
GFR: 37.94 mL/min — AB (ref >60.00)
GLUCOSE: 97 mg/dL (ref 70–99)
Potassium: 3.9 mEq/L (ref 3.5–5.1)
SODIUM: 140 meq/L (ref 135–145)

## 2013-09-17 MED ORDER — POTASSIUM CHLORIDE CRYS ER 20 MEQ PO TBCR: 20.0000 meq | EXTENDED_RELEASE_TABLET | Freq: Two times a day (BID) | ORAL | Status: DC

## 2013-09-17 MED ORDER — ISOSORBIDE MONONITRATE ER 30 MG PO TB24: 30.0000 mg | ORAL_TABLET | Freq: Every day | ORAL | Status: DC

## 2013-09-17 MED ORDER — POLYSACCHARIDE IRON COMPLEX 150 MG PO CAPS: 150.0000 mg | ORAL_CAPSULE | Freq: Two times a day (BID) | ORAL | Status: DC

## 2013-09-17 MED ORDER — CLOPIDOGREL BISULFATE 75 MG PO TABS: 75.0000 mg | ORAL_TABLET | Freq: Every day | ORAL | Status: DC

## 2013-09-17 MED ORDER — ATORVASTATIN CALCIUM 80 MG PO TABS: 80.0000 mg | ORAL_TABLET | Freq: Every day | ORAL | Status: DC

## 2013-09-17 MED ORDER — FUROSEMIDE 40 MG PO TABS: 60.0000 mg | ORAL_TABLET | Freq: Two times a day (BID) | ORAL | Status: DC

## 2013-09-17 MED ORDER — HYDRALAZINE HCL 25 MG PO TABS: 25.0000 mg | ORAL_TABLET | Freq: Three times a day (TID) | ORAL | Status: DC

## 2013-09-17 MED ORDER — NEBIVOLOL HCL 10 MG PO TABS: 10.0000 mg | ORAL_TABLET | Freq: Every day | ORAL | Status: DC

## 2013-09-17 NOTE — Patient Instructions (Signed)
Will obtain labs today and call you with the results (bmet)  Your physician recommends that you continue on your current medications as directed. Please refer to the Current Medication list given to you today.  Your physician recommends that you schedule a follow-up appointment in: 8-10 weeks with Dr Acie Fredrickson

## 2013-09-17 NOTE — Progress Notes (Signed)
[address] [place], [place]  [address] Phone: [phone] Fax:  [phone]  Date:  09/17/2013   ID:  [name], DOB September 30, 1930, MRN [id number]  PCP:   Melinda Crutch, MD  Cardiologist:  Dr. Jenell Milliner => Dr. Liam Rogers     History of Present Illness: [name] is a 80 y.o. male with a hx CAD s/p CABG 1995, NSTEMI treated with DES x 2 in 12/2011, COPD with chronic respiratory failure on home O2, probable CKD stage III, HTN, chronic diastolic CHF.  2D Echo 10/26/11: normal LV, EF 55-60%, mod dilated LA, calcified right and noncoronary commisure.    Admitted in 06/2013 with a COPD exacerbation and then later with a GI bleed.  He required 2 units PRBC for Hgb into the 7 range. EGD confirmed a prepyloric ulcer with path negative for malignancy. Hospital course was complicated by AKI (dc Cr 1.62). He likely has CKD as Cr in 2013 ran 1.3-1.7.  Aspirin and Plavix were held, but then restarted.    He was admitted again (from the office) 11/1023 with a/c diastolic CHF.   Echo (08/07/2013):  Mod LVH, EF 50-55%, severe LAE, mild to mod reduced RVSF.  D/c weight 295.  Of note, ARB held at d/c due to low BP and AKI in setting of diuresis.    I saw him 08/16/13 in follow up. Volume appeared stable.  Creatinine was elevated more and I elected to hold off on resuming his ARB and cut back on his diuretic.  F/u creatinine was stable.  He is here today with his daughter. His breathing continues to improve. He sleeps in a recliner chronically. He denies PND. He denies significant LE edema. His weights at home have been stable. He denies syncope. He has noted some left-sided chest discomfort that he describes as a throb over the last few weeks.  This is not necessarily brought on by exertion. He thinks that he may feel some of it up in his neck. It only lasts for 2-3 seconds. He denies any other associated symptoms. It is not like his previous angina.  Recent Labs: 08/07/2013: ALT 17; Pro B  Natriuretic peptide (BNP) 892.3*; TSH 2.745  08/16/2013: Hemoglobin 10.4*  08/21/2013: Creatinine 1.9*; Potassium 3.9   Wt Readings from Last 3 Encounters:  09/17/13 297 lb (134.718 kg)  08/16/13 294 lb (133.358 kg)  08/10/13 295 lb 11.2 oz (134.129 kg)     Past Medical History  Diagnosis Date  . CAD (coronary artery disease)     a. s/p CABG x 3 in 1995; b.  NSTEMI 12/2011 -> Cath 6/21: LM 20-30, LAD 90, RI 95, LCX 71m, RCA 90/118m (treated w/ 2.0x16 Promus DES), VG->RCA ok, VG->RI 70-69m (treated w/ 4.0x28 Promus), LIMA->LAD ok, EF 55-60%.  Marland Kitchen HTN (hypertension)   . HLD (hyperlipidemia)   . Morbid obesity   . Syncope   . COPD (chronic obstructive pulmonary disease)     a. on home O2  . HOH (hard of hearing)     left ear  . Asthma   . Sleep apnea   . Chronic diastolic CHF (congestive heart failure)     a. Echo 10/2011: EF 55-60%;  b. elevated EDP req diuresis.  . Chronic respiratory failure 07/11/2013    a. 3L home O2 24/7.  Marland Kitchen Orthopnea     a. Has been sleeping in a recliner x 30 yrs.  . Hypothyroidism   . GERD (gastroesophageal reflux disease)   .  Arthritis     "knees; hands" (07/11/2013)  . GI bleed     a. 06/2013 adm - prepyloric ulcer, path neg for malignancy. A/w ABL anemia.  . Acute blood loss anemia     a. 06/2013 due to GIB.  . CKD (chronic kidney disease)     a. ?Based on Cr 2013 - 1.3-1.7.    Current Outpatient Prescriptions  Medication Sig Dispense Refill  . albuterol (PROVENTIL HFA;VENTOLIN HFA) 108 (90 BASE) MCG/ACT inhaler Inhale 2 puffs into the lungs every 6 (six) hours as needed for wheezing or shortness of breath.      Marland Kitchen albuterol (PROVENTIL) (2.5 MG/3ML) 0.083% nebulizer solution Take 3 mLs (2.5 mg total) by nebulization every 6 (six) hours as needed for wheezing.  75 mL  12  . ARTIFICIAL TEAR OP Place 2 drops into both eyes daily as needed. For dry eyes      . aspirin 81 MG tablet Take 1 tablet (81 mg total) by mouth daily.      Marland Kitchen atorvastatin  (LIPITOR) 80 MG tablet Take 80 mg by mouth at bedtime.      . clopidogrel (PLAVIX) 75 MG tablet Take 1 tablet (75 mg total) by mouth daily with breakfast.  30 tablet  4  . fluticasone (FLOVENT HFA) 110 MCG/ACT inhaler Inhale 2 puffs into the lungs 2 (two) times daily.  1 Inhaler  12  . furosemide (LASIX) 40 MG tablet Take 1.5 tablets (60 mg total) by mouth 2 (two) times daily.      . hydrALAZINE (APRESOLINE) 25 MG tablet Take 25 mg by mouth 3 (three) times daily.       . iron polysaccharides (NU-IRON) 150 MG capsule Take 150 mg by mouth 2 (two) times daily.      . isosorbide mononitrate (IMDUR) 30 MG 24 hr tablet Take 30 mg by mouth daily.      . Meclizine HCl (BONINE) 25 MG CHEW Chew 1 tablet (25 mg total) by mouth 2 (two) times daily as needed (for vertigo).      . montelukast (SINGULAIR) 10 MG tablet Take 10 mg by mouth at bedtime.      . Multiple Vitamins-Minerals (MULTIVITAMIN & MINERAL PO) Take 1 tablet by mouth daily.        . nebivolol (BYSTOLIC) 10 MG tablet Take 1 tablet (10 mg total) by mouth daily.  30 tablet  8  . nitroGLYCERIN (NITROSTAT) 0.4 MG SL tablet Place 1 tablet (0.4 mg total) under the tongue every 5 (five) minutes as needed for chest pain.  25 tablet  3  . pantoprazole (PROTONIX) 40 MG tablet Take 2 tablets (80 mg total) by mouth 2 (two) times daily.  120 tablet  1  . potassium chloride SA (K-DUR,KLOR-CON) 20 MEQ tablet Take 20 mEq by mouth 2 (two) times daily.       Marland Kitchen tiotropium (SPIRIVA) 18 MCG inhalation capsule Place 1 capsule (18 mcg total) into inhaler and inhale daily.  30 capsule  12   No current facility-administered medications for this visit.    Allergies:   Other   Social History:  The patient  reports that he has never smoked. He has never used smokeless tobacco. He reports that he drinks alcohol. He reports that he does not use illicit drugs.   Family History:  The patient's family history includes CAD in an other family member.   ROS:  Please see the  history of present illness.   He denies any bleeding problems.  All other systems reviewed and negative.   PHYSICAL EXAM: VS:  BP 110/70  Pulse 70  Ht 5\' 11"  (1.803 m)  Wt 297 lb (134.718 kg)  BMI 41.44 kg/m2 Well nourished, well developed, in no acute distress HEENT: normal Neck: no JVD Cardiac:  distant S1, S2; RRR; no murmur Lungs:  Decreased breath sounds bilaterally, No wheezing, rhonchi, rales Abd: soft, nontender, no hepatomegaly Ext: Very trace bilateral LE edema Skin: warm and dry Neuro:  CNs 2-12 intact, no focal abnormalities noted  EKG:   NSR, HR 70, poor R wave progression, nonspecific ST-T wave changes, no change from prior tracing    ASSESSMENT AND PLAN:  1. Chronic Diastolic CHF:  Volume appears stable. Continue current therapy. 2. Chest pain: Somewhat atypical. He has not had assessment for ischemia since his non-STEMI in 2013 treated with PCI. His symptoms are not like his previous angina. We had a long discussion regarding watchful waiting versus proceeding with stress testing. At this point, we will continue to monitor his symptoms. He will contact me if they become more frequent or consistent with exertion. At that point, I would suggest stress testing. 3. CAD, Status Post Prior MI:  He remains on aspirin, Plavix, statin. 4. Hx of UGI Bleed:  No further signs of bleeding. He remains on PPI therapy.  5. COPD:  He remains on continuous O2. He is not wearing it today. I have recommended he continue to wear his oxygen as prescribed.  He is chronically short of breath. 6. Chronic Kidney Disease:  Repeat basic metabolic panel today. Given recent creatinine levels, hold off on resuming ARB at this point.  7. Hypertension:  Controlled. 8. Hyperlipidemia:  Continue statin. 9. Disposition:  Follow up with Dr. Acie Fredrickson in 8-10 weeks.  Signed, Richardson Dopp, PA-C  09/17/2013 10:23 AM

## 2013-11-08 ENCOUNTER — Other Ambulatory Visit: Payer: Self-pay

## 2013-11-08 MED ORDER — PANTOPRAZOLE SODIUM 40 MG PO TBEC
80.0000 mg | DELAYED_RELEASE_TABLET | Freq: Two times a day (BID) | ORAL | Status: DC
Start: 2013-11-08 — End: 2014-01-23

## 2013-12-13 ENCOUNTER — Encounter: Payer: Self-pay | Admitting: Cardiovascular Disease

## 2013-12-13 ENCOUNTER — Ambulatory Visit (INDEPENDENT_AMBULATORY_CARE_PROVIDER_SITE_OTHER): Payer: Medicare Other | Admitting: Cardiovascular Disease

## 2013-12-13 VITALS — BP 153/86 | HR 78 | Ht 71.0 in | Wt 290.0 lb

## 2013-12-13 DIAGNOSIS — I5033 Acute on chronic diastolic (congestive) heart failure: Secondary | ICD-10-CM

## 2013-12-13 DIAGNOSIS — I2510 Atherosclerotic heart disease of native coronary artery without angina pectoris: Secondary | ICD-10-CM

## 2013-12-13 DIAGNOSIS — D6951 Posttransfusion purpura: Secondary | ICD-10-CM | POA: Insufficient documentation

## 2013-12-13 DIAGNOSIS — I509 Heart failure, unspecified: Secondary | ICD-10-CM

## 2013-12-13 DIAGNOSIS — Y849 Medical procedure, unspecified as the cause of abnormal reaction of the patient, or of later complication, without mention of misadventure at the time of the procedure: Secondary | ICD-10-CM

## 2013-12-13 LAB — CBC WITH DIFFERENTIAL/PLATELET
BASOS ABS: 0 10*3/uL (ref 0.0–0.1)
Basophils Relative: 0.3 % (ref 0.0–3.0)
Eosinophils Absolute: 0.3 10*3/uL (ref 0.0–0.7)
Eosinophils Relative: 3.4 % (ref 0.0–5.0)
HCT: 40.9 % (ref 39.0–52.0)
HEMOGLOBIN: 13.3 g/dL (ref 13.0–17.0)
Lymphocytes Relative: 27.9 % (ref 12.0–46.0)
Lymphs Abs: 2.6 10*3/uL (ref 0.7–4.0)
MCHC: 32.5 g/dL (ref 30.0–36.0)
MCV: 81.5 fl (ref 78.0–100.0)
MONOS PCT: 9.6 % (ref 3.0–12.0)
Monocytes Absolute: 0.9 10*3/uL (ref 0.1–1.0)
NEUTROS ABS: 5.5 10*3/uL (ref 1.4–7.7)
Neutrophils Relative %: 58.8 % (ref 43.0–77.0)
PLATELETS: 235 10*3/uL (ref 150.0–400.0)
RBC: 5.02 Mil/uL (ref 4.22–5.81)
RDW: 23.4 % — AB (ref 11.5–15.5)
WBC: 9.3 10*3/uL (ref 4.0–10.5)

## 2013-12-13 NOTE — Patient Instructions (Signed)
Your physician recommends that you continue on your current medications as directed. Please refer to the Current Medication list given to you today.  Your physician wants you to follow-up in: 6 months with Dr. Nahser.  You will receive a reminder letter in the mail two months in advance. If you don't receive a letter, please call our office to schedule the follow-up appointment.  

## 2013-12-13 NOTE — Assessment & Plan Note (Signed)
Is quite comfortable. His breathing is normal. Continue with his same medications.

## 2013-12-13 NOTE — Progress Notes (Signed)
[address] [place], [place]  [address] Phone: [phone] Fax:  [phone]  Date:  12/13/2013   ID:  HOLDEN MANISCALCO, DOB Nov 01, 1930, MRN [id number]  PCP:   Melinda Crutch, MD  Cardiologist:  Dr. Jenell Milliner => Dr. Liam Rogers      Problem List 1. coronary artery disease-status post CABG 1995, status post DES in June, 2013 2. COPD- due to asthma ( never smoker)  on home O2,  3. Chronic kidney disease stage III 4. Hypertension 5. Chronic diastolic congestive heart failure    History of Present Illness: [name] is a 80 y.o. male with a hx CAD s/p CABG 1995, NSTEMI treated with DES x 2 in 12/2011, COPD with chronic respiratory failure on home O2, probable CKD stage III, HTN, chronic diastolic CHF.  2D Echo 10/26/11: normal LV, EF 55-60%, mod dilated LA, calcified right and noncoronary commisure.    He was admitted to the hospital in December for a GI bleed. He was hospitalized in early December with COPD exacerbation (TnI neg). He returned to the hospital around Christmas with melena and ABL anemia. He required 2 units PRBC for Hgb into the 7 range. EGD confirmed a prepyloric ulcer with path negative for malignancy. Hospital course was complicated by AKI (dc Cr 1.62). He likely has CKD as Cr in 2013 ran 1.3-1.7. Last Hgb on 07/26/13 was stable at 9.7 with negative FOBT. Aspirin and Plavix were held and restarted.    He was seen in the office for post hospital follow up on 08/07/13 and was volume overloaded (up 16 lbs) and complaining of chest pain with minimal activity.  He was admitted again 7/16-9/67 with a/c diastolic CHF.   He was diuresed with IV Lasix.  Echo (08/07/2013):  Mod LVH, EF 50-55%, severe LaE, mild to mod reduced RVSF.  CEs were neg.  Chest pain resolved with diuresis.  He was kept on ASA and Plavix.  D/c weight 295.  Of note, ARB held at d/c due to low BP and AKI in setting of diuresis.    Since discharge, he is doing well. His breathing is overall  improved. However, with COPD he continues with chronic NYHA class III dyspnea. He sleeps in a recliner chronically. He denies PND. LE edema is much improved. He denies chest pain. He denies syncope.  Dec 13, 2013:  [name] seems to be doing quite a bit better. His shortness breath eventually resolved after a prednisone taper.  He has a big garden - tomatoes, green beans, peas.    Walks on occasion.    Stays active in his garden.    His BP is a bit high today .  Typically it's normal.    Tries to avoid salt. He eats at home most of the time ( son cooks).  He is retired as a Emergency planning/management officer Labs: 08/07/2013: ALT 17; Pro B Natriuretic peptide (BNP) 892.3*; TSH 2.745  08/16/2013: Hemoglobin 10.4*  09/17/2013: Creatinine 1.8*; Potassium 3.9   Wt Readings from Last 3 Encounters:  12/13/13 290 lb (131.543 kg)  09/17/13 297 lb (134.718 kg)  08/16/13 294 lb (133.358 kg)     Past Medical History  Diagnosis Date  . CAD (coronary artery disease)     a. s/p CABG x 3 in 1995; b.  NSTEMI 12/2011 -> Cath 6/21: LM 20-30, LAD 90, RI 95, LCX 9m, RCA 90/130m (treated w/ 2.0x16 Promus DES), VG->RCA ok, VG->RI  70-20m (treated w/ 4.0x28 Promus), LIMA->LAD ok, EF 55-60%.  Marland Kitchen HTN (hypertension)   . HLD (hyperlipidemia)   . Morbid obesity   . Syncope   . COPD (chronic obstructive pulmonary disease)     a. on home O2  . HOH (hard of hearing)     left ear  . Asthma   . Sleep apnea   . Chronic diastolic CHF (congestive heart failure)     a. Echo 10/2011: EF 55-60%;  b. elevated EDP req diuresis.  . Chronic respiratory failure 07/11/2013    a. 3L home O2 24/7.  Marland Kitchen Orthopnea     a. Has been sleeping in a recliner x 30 yrs.  . Hypothyroidism   . GERD (gastroesophageal reflux disease)   . Arthritis     "knees; hands" (07/11/2013)  . GI bleed     a. 06/2013 adm - prepyloric ulcer, path neg for malignancy. A/w ABL anemia.  . Acute blood loss anemia     a. 06/2013 due to GIB.  . CKD (chronic kidney  disease)     a. ?Based on Cr 2013 - 1.3-1.7.    Current Outpatient Prescriptions  Medication Sig Dispense Refill  . albuterol (PROVENTIL HFA;VENTOLIN HFA) 108 (90 BASE) MCG/ACT inhaler Inhale 2 puffs into the lungs every 6 (six) hours as needed for wheezing or shortness of breath.      Marland Kitchen albuterol (PROVENTIL) (2.5 MG/3ML) 0.083% nebulizer solution Take 3 mLs (2.5 mg total) by nebulization every 6 (six) hours as needed for wheezing.  75 mL  12  . ARTIFICIAL TEAR OP Place 2 drops into both eyes daily as needed. For dry eyes      . aspirin 81 MG tablet Take 1 tablet (81 mg total) by mouth daily.      Marland Kitchen atorvastatin (LIPITOR) 80 MG tablet Take 1 tablet (80 mg total) by mouth at bedtime.  30 tablet  5  . clopidogrel (PLAVIX) 75 MG tablet Take 1 tablet (75 mg total) by mouth daily with breakfast.  30 tablet  5  . fluticasone (FLOVENT HFA) 110 MCG/ACT inhaler Inhale 2 puffs into the lungs 2 (two) times daily.  1 Inhaler  12  . furosemide (LASIX) 40 MG tablet Take 1.5 tablets (60 mg total) by mouth 2 (two) times daily.  90 tablet  5  . hydrALAZINE (APRESOLINE) 25 MG tablet Take 1 tablet (25 mg total) by mouth 3 (three) times daily.  90 tablet  5  . iron polysaccharides (NU-IRON) 150 MG capsule Take 1 capsule (150 mg total) by mouth 2 (two) times daily.  60 capsule  2  . isosorbide mononitrate (IMDUR) 30 MG 24 hr tablet Take 1 tablet (30 mg total) by mouth daily.  30 tablet  5  . Meclizine HCl (BONINE) 25 MG CHEW Chew 1 tablet (25 mg total) by mouth 2 (two) times daily as needed (for vertigo).      . montelukast (SINGULAIR) 10 MG tablet Take 10 mg by mouth at bedtime.      . Multiple Vitamins-Minerals (MULTIVITAMIN & MINERAL PO) Take 1 tablet by mouth daily.        . nebivolol (BYSTOLIC) 10 MG tablet Take 1 tablet (10 mg total) by mouth daily.  30 tablet  5  . nitroGLYCERIN (NITROSTAT) 0.4 MG SL tablet Place 1 tablet (0.4 mg total) under the tongue every 5 (five) minutes as needed for chest pain.  25  tablet  3  . pantoprazole (PROTONIX) 40 MG tablet Take 2  tablets (80 mg total) by mouth 2 (two) times daily.  120 tablet  1  . potassium chloride SA (K-DUR,KLOR-CON) 20 MEQ tablet Take 1 tablet (20 mEq total) by mouth 2 (two) times daily.  60 tablet  5  . tiotropium (SPIRIVA) 18 MCG inhalation capsule Place 1 capsule (18 mcg total) into inhaler and inhale daily.  30 capsule  12   No current facility-administered medications for this visit.    Allergies:   Other   Social History:  The patient  reports that he has never smoked. He has never used smokeless tobacco. He reports that he drinks alcohol. He reports that he does not use illicit drugs.   Family History:  The patient's family history includes CAD in an other family member.   ROS:  Please see the history of present illness.   He denies any melena or hematochezia. He has had some mild epistaxis. He has a chronic cough.   All other systems reviewed and negative.   PHYSICAL EXAM: VS:  BP 153/86  Pulse 78  Ht 5\' 11"  (1.803 m)  Wt 290 lb (131.543 kg)  BMI 40.46 kg/m2 Well nourished, well developed, in no acute distress HEENT: normal Neck: I cannot assess JVD Cardiac:  distant S1, S2; RRR; no murmur Lungs:  Decreased breath sounds bilaterally, dry bibasilar crackles that somewhat clear with cough Abd: soft, nontender, no hepatomegaly Ext: Very trace bilateral LE edema Skin: warm and dry Neuro:  CNs 2-12 intact, no focal abnormalities noted  EKG:  NSR, HR 75, poor R wave progression, nonspecific ST-T wave changes     ASSESSMENT AND PLAN:

## 2013-12-13 NOTE — Assessment & Plan Note (Signed)
He seems to be very stable. He's not had any episodes of angina.   Continue same medications

## 2013-12-13 NOTE — Assessment & Plan Note (Signed)
Patient developed purpura after his transfusion.  Will draw a CBC to check his platelets.   He will need to see his medical doctor for these if they do not resolve.

## 2014-01-08 ENCOUNTER — Other Ambulatory Visit: Payer: Self-pay | Admitting: Physician Assistant

## 2014-01-23 ENCOUNTER — Other Ambulatory Visit: Payer: Self-pay | Admitting: Cardiovascular Disease

## 2014-02-07 ENCOUNTER — Other Ambulatory Visit: Payer: Self-pay | Admitting: Cardiovascular Disease

## 2014-02-20 ENCOUNTER — Other Ambulatory Visit: Payer: Self-pay | Admitting: Family Medicine

## 2014-03-13 ENCOUNTER — Other Ambulatory Visit: Payer: Self-pay | Admitting: Physician Assistant

## 2014-04-29 ENCOUNTER — Other Ambulatory Visit: Payer: Self-pay | Admitting: Physician Assistant

## 2014-05-06 ENCOUNTER — Other Ambulatory Visit: Payer: Self-pay | Admitting: Nurse Practitioner

## 2014-05-06 MED ORDER — ATORVASTATIN CALCIUM 80 MG PO TABS: 80.0000 mg | ORAL_TABLET | Freq: Every day | ORAL | Status: DC

## 2014-06-10 ENCOUNTER — Other Ambulatory Visit: Payer: Self-pay | Admitting: Cardiovascular Disease

## 2014-06-10 MED ORDER — POTASSIUM CHLORIDE CRYS ER 20 MEQ PO TBCR: 20.0000 meq | EXTENDED_RELEASE_TABLET | Freq: Two times a day (BID) | ORAL | Status: DC

## 2014-06-19 ENCOUNTER — Encounter: Payer: Self-pay | Admitting: Cardiovascular Disease

## 2014-06-19 ENCOUNTER — Ambulatory Visit (INDEPENDENT_AMBULATORY_CARE_PROVIDER_SITE_OTHER): Payer: Medicare Other | Admitting: Cardiovascular Disease

## 2014-06-19 VITALS — BP 106/64 | HR 70 | Ht 71.0 in | Wt 296.1 lb

## 2014-06-19 DIAGNOSIS — I2510 Atherosclerotic heart disease of native coronary artery without angina pectoris: Secondary | ICD-10-CM

## 2014-06-19 DIAGNOSIS — I5033 Acute on chronic diastolic (congestive) heart failure: Secondary | ICD-10-CM

## 2014-06-19 NOTE — Assessment & Plan Note (Signed)
No CP 

## 2014-06-19 NOTE — Assessment & Plan Note (Signed)
[name] has hx of CHF - he seems to be fairly close to euvolemic today.  He is very short of breath but this is wheezing. Encouraged him to see his pulmonologist. I Don't think that he needs any additional medication changes from me at this time.

## 2014-06-19 NOTE — Patient Instructions (Signed)
Your physician recommends that you have lab work:  TODAY - Basic metabolic panel  Your physician recommends that you continue on your current medications as directed. Please refer to the Current Medication list given to you today.  Your physician wants you to follow-up in: 6 months with Dr. Acie Fredrickson.  You will receive a reminder letter in the mail two months in advance. If you don't receive a letter, please call our office to schedule the follow-up appointment.

## 2014-06-19 NOTE — Progress Notes (Signed)
[address] [place], [place]  [address] Phone: [phone] Fax:  [phone]  Date:  06/19/2014   ID:  [name], DOB 04-22-1931, MRN [id number]  PCP:   Melinda Crutch, MD  Cardiologist:  Dr. Jenell Milliner => Dr. Liam Rogers      Problem List 1. coronary artery disease-status post CABG 1995, status post DES in June, 2013 2. COPD- due to asthma ( never smoker)  on home O2,  3. Chronic kidney disease stage III 4. Hypertension 5. Chronic diastolic congestive heart failure   History of Present Illness: [name] is a 80 y.o. male with a hx CAD s/p CABG 1995, NSTEMI treated with DES x 2 in 12/2011, COPD with chronic respiratory failure on home O2, probable CKD stage III, HTN, chronic diastolic CHF.  2D Echo 10/26/11: normal LV, EF 55-60%, mod dilated LA, calcified right and noncoronary commisure.    He was admitted to the hospital in December for a GI bleed. He was hospitalized in early December with COPD exacerbation (TnI neg). He returned to the hospital around Christmas with melena and ABL anemia. He required 2 units PRBC for Hgb into the 7 range. EGD confirmed a prepyloric ulcer with path negative for malignancy. Hospital course was complicated by AKI (dc Cr 1.62). He likely has CKD as Cr in 2013 ran 1.3-1.7. Last Hgb on 07/26/13 was stable at 9.7 with negative FOBT. Aspirin and Plavix were held and restarted.    He was seen in the office for post hospital follow up on 08/07/13 and was volume overloaded (up 16 lbs) and complaining of chest pain with minimal activity.  He was admitted again 4/31-5/40 with a/c diastolic CHF.   He was diuresed with IV Lasix.  Echo (08/07/2013):  Mod LVH, EF 50-55%, severe LaE, mild to mod reduced RVSF.  CEs were neg.  Chest pain resolved with diuresis.  He was kept on ASA and Plavix.  D/c weight 295.  Of note, ARB held at d/c due to low BP and AKI in setting of diuresis.    Since discharge, he is doing well. His breathing is overall  improved. However, with COPD he continues with chronic NYHA class III dyspnea. He sleeps in a recliner chronically. He denies PND. LE edema is much improved. He denies chest pain. He denies syncope.  Dec 13, 2013:  [name] seems to be doing quite a bit better. His shortness breath eventually resolved after a prednisone taper.  He has a big garden - tomatoes, green beans, peas.    Walks on occasion.    Stays active in his garden.    His BP is a bit high today .  Typically it's normal.    Tries to avoid salt. He eats at home most of the time ( son cooks).  He is retired as a Engineer, building services   Dec. 2, 2015:  [name] is seen today for follow up of his CAD , /hTN and chronic diastilic CHF. He is short of breath today.   wheezing significantly. His BP dropped recently . He was sen my his medical doctor and his hydralazine was stopped. He was also found to be volume overloaded and was started on metolazone 3 times a week.     Recent Labs: 08/07/2013: ALT 17; Pro B Natriuretic peptide (BNP) 892.3*; TSH 2.745 09/17/2013: Creatinine 1.8*; Potassium 3.9 12/13/2013: Hemoglobin 13.3  Wt Readings from Last 3 Encounters:  06/19/14 296 lb 1.9 oz (  134.319 kg)  12/13/13 290 lb (131.543 kg)  09/17/13 297 lb (134.718 kg)     Past Medical History  Diagnosis Date  . CAD (coronary artery disease)     a. s/p CABG x 3 in 1995; b.  NSTEMI 12/2011 -> Cath 6/21: LM 20-30, LAD 90, RI 95, LCX 31m, RCA 90/138m (treated w/ 2.0x16 Promus DES), VG->RCA ok, VG->RI 70-9m (treated w/ 4.0x28 Promus), LIMA->LAD ok, EF 55-60%.  Marland Kitchen HTN (hypertension)   . HLD (hyperlipidemia)   . Morbid obesity   . Syncope   . COPD (chronic obstructive pulmonary disease)     a. on home O2  . HOH (hard of hearing)     left ear  . Asthma   . Sleep apnea   . Chronic diastolic CHF (congestive heart failure)     a. Echo 10/2011: EF 55-60%;  b. elevated EDP req diuresis.  . Chronic respiratory failure 07/11/2013    a. 3L home O2 24/7.  Marland Kitchen  Orthopnea     a. Has been sleeping in a recliner x 30 yrs.  . Hypothyroidism   . GERD (gastroesophageal reflux disease)   . Arthritis     "knees; hands" (07/11/2013)  . GI bleed     a. 06/2013 adm - prepyloric ulcer, path neg for malignancy. A/w ABL anemia.  . Acute blood loss anemia     a. 06/2013 due to GIB.  . CKD (chronic kidney disease)     a. ?Based on Cr 2013 - 1.3-1.7.    Current Outpatient Prescriptions  Medication Sig Dispense Refill  . albuterol (PROVENTIL HFA;VENTOLIN HFA) 108 (90 BASE) MCG/ACT inhaler Inhale 2 puffs into the lungs every 6 (six) hours as needed for wheezing or shortness of breath.    Marland Kitchen albuterol (PROVENTIL) (2.5 MG/3ML) 0.083% nebulizer solution Take 3 mLs (2.5 mg total) by nebulization every 6 (six) hours as needed for wheezing. 75 mL 12  . ARTIFICIAL TEAR OP Place 2 drops into both eyes daily as needed. For dry eyes    . aspirin 81 MG tablet Take 1 tablet (81 mg total) by mouth daily.    Marland Kitchen atorvastatin (LIPITOR) 80 MG tablet Take 1 tablet (80 mg total) by mouth daily at 6 PM. 91 tablet 3  . BYSTOLIC 10 MG tablet TAKE ONE TABLET BY MOUTH ONCE DAILY 30 tablet 6  . clopidogrel (PLAVIX) 75 MG tablet Take 1 tablet (75 mg total) by mouth daily with breakfast. 30 tablet 5  . FERREX 150 150 MG capsule TAKE ONE CAPSULE BY MOUTH TWICE DAILY 60 capsule 6  . fluticasone (FLOVENT HFA) 110 MCG/ACT inhaler Inhale 2 puffs into the lungs 2 (two) times daily. 1 Inhaler 12  . furosemide (LASIX) 40 MG tablet TAKE ONE AND ONE-HALF TABLETS BY MOUTH TWICE DAILY 90 tablet 6  . isosorbide mononitrate (IMDUR) 30 MG 24 hr tablet Take 1 tablet (30 mg total) by mouth daily. 30 tablet 5  . Meclizine HCl (BONINE) 25 MG CHEW Chew 1 tablet (25 mg total) by mouth 2 (two) times daily as needed (for vertigo).    . metolazone (ZAROXOLYN) 2.5 MG tablet Take 2.5 mg by mouth. ON Monday, Wednesday and Friday mornings.    . montelukast (SINGULAIR) 10 MG tablet Take 10 mg by mouth at bedtime.    .  Multiple Vitamins-Minerals (MULTIVITAMIN & MINERAL PO) Take 1 tablet by mouth daily.      . nitroGLYCERIN (NITROSTAT) 0.4 MG SL tablet Place 1 tablet (0.4 mg total) under the  tongue every 5 (five) minutes as needed for chest pain. 25 tablet 3  . pantoprazole (PROTONIX) 40 MG tablet TAKE TWO TABLETS BY MOUTH TWICE DAILY 120 tablet 0  . potassium chloride SA (K-DUR,KLOR-CON) 20 MEQ tablet Take 1 tablet (20 mEq total) by mouth 2 (two) times daily. 60 tablet 5  . tiotropium (SPIRIVA) 18 MCG inhalation capsule Place 1 capsule (18 mcg total) into inhaler and inhale daily. 30 capsule 12  . hydrALAZINE (APRESOLINE) 25 MG tablet Take 1 tablet (25 mg total) by mouth 3 (three) times daily. (Patient not taking: Reported on 06/19/2014) 90 tablet 5   No current facility-administered medications for this visit.    Allergies:   Other   Social History:  The patient  reports that he has never smoked. He has never used smokeless tobacco. He reports that he drinks alcohol. He reports that he does not use illicit drugs.   Family History:  The patient's family history includes CAD in an other family member.   ROS:  Please see the history of present illness.   He denies any melena or hematochezia. He has had some mild epistaxis. He has a chronic cough.   All other systems reviewed and negative.   PHYSICAL EXAM: VS:  Ht 5\' 11"  (1.803 m)  Wt 296 lb 1.9 oz (134.319 kg)  BMI 41.32 kg/m2 Well nourished, well developed, in no acute distress HEENT: normal Neck: I cannot assess JVD Cardiac:  distant S1, S2; RRR; no murmur Lungs:  Severe , tight wheezes.  Abd: soft, nontender, no hepatomegaly Ext: Very trace bilateral LE edema Skin: warm and dry Neuro:  CNs 2-12 intact, no focal abnormalities noted  EKG:  NSR, HR 75, poor R wave progression, nonspecific ST-T wave changes     ASSESSMENT AND PLAN:

## 2014-06-20 LAB — BASIC METABOLIC PANEL
BUN: 20 mg/dL (ref 6–23)
CHLORIDE: 99 meq/L (ref 96–112)
CO2: 33 meq/L — AB (ref 19–32)
Calcium: 8.7 mg/dL (ref 8.4–10.5)
Creatinine, Ser: 1.7 mg/dL — ABNORMAL HIGH (ref 0.4–1.5)
GFR: 40.14 mL/min — ABNORMAL LOW (ref >60.00)
Glucose, Bld: 120 mg/dL — ABNORMAL HIGH (ref 70–99)
Potassium: 4.1 mEq/L (ref 3.5–5.1)
SODIUM: 133 meq/L — AB (ref 135–145)

## 2014-06-27 ENCOUNTER — Encounter (HOSPITAL_COMMUNITY): Payer: Self-pay | Admitting: Cardiology

## 2014-07-02 ENCOUNTER — Encounter: Payer: Self-pay | Admitting: Adult Health

## 2014-07-02 ENCOUNTER — Ambulatory Visit (INDEPENDENT_AMBULATORY_CARE_PROVIDER_SITE_OTHER)
Admission: RE | Admit: 2014-07-02 | Discharge: 2014-07-02 | Disposition: A | Discharge status: Home / Self Care | Payer: Medicare Other | Source: Ambulatory Visit | Attending: Adult Health | Admitting: Adult Health

## 2014-07-02 ENCOUNTER — Ambulatory Visit (INDEPENDENT_AMBULATORY_CARE_PROVIDER_SITE_OTHER): Payer: Medicare Other | Admitting: Adult Health

## 2014-07-02 VITALS — BP 140/64 | HR 91 | Temp 97.9°F | Ht 71.0 in | Wt 302.0 lb

## 2014-07-02 DIAGNOSIS — J441 Chronic obstructive pulmonary disease with (acute) exacerbation: Secondary | ICD-10-CM

## 2014-07-02 MED ORDER — AMOXICILLIN-POT CLAVULANATE 875-125 MG PO TABS: 1.0000 | ORAL_TABLET | Freq: Two times a day (BID) | ORAL | Status: AC

## 2014-07-02 MED ORDER — LEVALBUTEROL HCL 0.63 MG/3ML IN NEBU
0.6300 mg | INHALATION_SOLUTION | Freq: Once | RESPIRATORY_TRACT | Status: AC
  Administered 2014-07-02: 0.63 mg via RESPIRATORY_TRACT

## 2014-07-02 MED ORDER — PREDNISONE 10 MG PO TABS: ORAL_TABLET | ORAL | Status: DC

## 2014-07-02 NOTE — Progress Notes (Signed)
   Subjective:    Patient ID: [name], male    DOB: 12-05-1930, 80 y.o.   MRN: [id number]  HPI 80 yo male with chronic bronchitis   07/02/2014 Acute OV  Complains of sob, cough and wheezing for last 2 weeks . Coughing up a thick milky white mucus.  NO fever , chest pain or orthopnea. No increased leg swelling.  Did not wear Oxygen to office, left it in the car.  O2 sats  85% RA on arrival to room, then 91% with 2L Encouraged O2 compliance.  Remains on Flovent and Spiriva . -admits he does not take everyday .   Review of Systems Constitutional:   No  weight loss, night sweats,  Fevers, chills,  +fatigue, or  lassitude.  HEENT:   No headaches,  Difficulty swallowing,  Tooth/dental problems, or  Sore throat,                No sneezing, itching, ear ache,  +nasal congestion, post nasal drip,   CV:  No chest pain,  Orthopnea, PND, swelling in lower extremities, anasarca, dizziness, palpitations, syncope.   GI  No heartburn, indigestion, abdominal pain, nausea, vomiting, diarrhea, change in bowel habits, loss of appetite, bloody stools.   Resp:    No chest wall deformity  Skin: no rash or lesions.  GU: no dysuria, change in color of urine, no urgency or frequency.  No flank pain, no hematuria   MS:  No joint pain or swelling.  No decreased range of motion.  No back pain.  Psych:  No change in mood or affect. No depression or anxiety.  No memory loss.         Objective:   Physical Exam GEN: A/Ox3; pleasant , NAD, obese   HEENT:  [place]/AT,  EACs-clear, TMs-wnl, NOSE-clear, THROAT-clear, no lesions, no postnasal drip or exudate noted.   NECK:  Supple w/ fair ROM; no JVD; normal carotid impulses w/o bruits; no thyromegaly or nodules palpated; no lymphadenopathy.  RESP  Coarse Rhonchi and exp wheezing no accessory muscle use, no dullness to percussion  CARD:  RRR, no m/r/g  , tr  peripheral edema, pulses intact, no cyanosis or clubbing.  GI:   Soft & nt; nml bowel  sounds; no organomegaly or masses detected.  Musco: Warm bil, no deformities or joint swelling noted.   Neuro: alert, no focal deficits noted.    Skin: Warm, no lesions or rashes         Assessment & Plan:

## 2014-07-02 NOTE — Addendum Note (Signed)
Addended by: Parke Poisson E on: 07/02/2014 10:48 AM   Modules accepted: Orders

## 2014-07-02 NOTE — Assessment & Plan Note (Signed)
Exacerbation  Check cxr  xopenex neb x 1  Encouraged on O2 and med compliance   Plan  Augmentin 875 mg twice daily, take with food Prednisone taper. The next week Mucinex DM twice daily as needed for cough and congestion. Use oxygen 2 L with activity and at bedtime Take your Flovent 2 puffs twice daily, rinse after use. Take Spiriva 1 puff daily Follow Dr. Joya Gaskins in 6 weeks and as needed. Please contact office for sooner follow up if symptoms do not improve or worsen or seek emergency care

## 2014-07-02 NOTE — Patient Instructions (Signed)
Augmentin 875 mg twice daily, take with food Prednisone taper. The next week Mucinex DM twice daily as needed for cough and congestion. Use oxygen 2 L with activity and at bedtime Take your Flovent 2 puffs twice daily, rinse after use. Take Spiriva 1 puff daily Follow Dr. Joya Gaskins in 6 weeks and as needed. Please contact office for sooner follow up if symptoms do not improve or worsen or seek emergency care

## 2014-07-09 NOTE — Progress Notes (Signed)
Quick Note:  Called spoke with patient, advised of cxr results / recs as stated by TP. Pt verbalized his understanding and denied any questions. ______ 

## 2014-08-14 ENCOUNTER — Encounter: Payer: Self-pay | Admitting: Critical Care Medicine

## 2014-08-14 ENCOUNTER — Ambulatory Visit (INDEPENDENT_AMBULATORY_CARE_PROVIDER_SITE_OTHER): Payer: Medicare Other | Admitting: Critical Care Medicine

## 2014-08-14 VITALS — BP 132/82 | HR 62 | Temp 98.3°F | Ht 71.0 in | Wt 299.0 lb

## 2014-08-14 DIAGNOSIS — J449 Chronic obstructive pulmonary disease, unspecified: Secondary | ICD-10-CM

## 2014-08-14 NOTE — Progress Notes (Signed)
Subjective:    Patient ID: [name], male    DOB: 05/01/1931, 81 y.o.   MRN: [id number]  HPI 81 yo male with chronic bronchitis   08/14/2014 Chief Complaint  Patient presents with  . COPD    Breathing is slightly improved since last OV. Reports SOB, coughing with production of white mucus. Denies wheezing or chest tightness.  Pt is better.  Less dyspnea.  Less cough .  Mucus is white. Pt denies any significant sore throat, nasal congestion or excess secretions, fever, chills, sweats, unintended weight loss, pleurtic or exertional chest pain, orthopnea PND, or leg swelling Pt denies any increase in rescue therapy over baseline, denies waking up needing it or having any early am or nocturnal exacerbations of coughing/wheezing/or dyspnea. Pt also denies any obvious fluctuation in symptoms with  weather or environmental change or other alleviating or aggravating factors  Review of Systems Constitutional:   No  weight loss, night sweats,  Fevers, chills,  +fatigue, or  lassitude.  HEENT:   No headaches,  Difficulty swallowing,  Tooth/dental problems, or  Sore throat,                No sneezing, itching, ear ache,  +nasal congestion, post nasal drip,   CV:  No chest pain,  Orthopnea, PND, swelling in lower extremities, anasarca, dizziness, palpitations, syncope.   GI  No heartburn, indigestion, abdominal pain, nausea, vomiting, diarrhea, change in bowel habits, loss of appetite, bloody stools.   Resp:    No chest wall deformity  Skin: no rash or lesions.  GU: no dysuria, change in color of urine, no urgency or frequency.  No flank pain, no hematuria   MS:  No joint pain or swelling.  No decreased range of motion.  No back pain.  Psych:  No change in mood or affect. No depression or anxiety.  No memory loss.         Objective:   Physical Exam  BP 132/82 mmHg  Pulse 62  Temp(Src) 98.3 F (36.8 C) (Oral)  Ht 5\' 11"  (1.803 m)  Wt 299 lb (135.626 kg)  BMI 41.72 kg/m2   SpO2 95%  GEN: A/Ox3; pleasant , NAD, obese   HEENT:  [place]/AT,  EACs-clear, TMs-wnl, NOSE-clear, THROAT-clear, no lesions, no postnasal drip or exudate noted.   NECK:  Supple w/ fair ROM; no JVD; normal carotid impulses w/o bruits; no thyromegaly or nodules palpated; no lymphadenopathy.  RESP  Coarse Rhonchi and exp wheezing no accessory muscle use, no dullness to percussion  CARD:  RRR, no m/r/g  , tr  peripheral edema, pulses intact, no cyanosis or clubbing.  GI:   Soft & nt; nml bowel sounds; no organomegaly or masses detected.  Musco: Warm bil, no deformities or joint swelling noted.   Neuro: alert, no focal deficits noted.    Skin: Warm, no lesions or rashes        . Assessment & Plan:   Obstructive chronic bronchitis without exacerbation Chronic obstructive lung disease stable at present  S/p recent exacerbation of same Plan No change in inhaled or maintenance medications. Return in  4 months     Updated Medication List Outpatient Encounter Prescriptions as of 08/14/2014  Medication Sig  . albuterol (PROVENTIL HFA;VENTOLIN HFA) 108 (90 BASE) MCG/ACT inhaler Inhale 2 puffs into the lungs every 6 (six) hours as needed for wheezing or shortness of breath.  Marland Kitchen albuterol (PROVENTIL) (2.5 MG/3ML) 0.083% nebulizer solution Take 3 mLs (2.5 mg total) by  nebulization every 6 (six) hours as needed for wheezing.  Marland Kitchen ARTIFICIAL TEAR OP Place 2 drops into both eyes daily as needed. For dry eyes  . aspirin 81 MG tablet Take 1 tablet (81 mg total) by mouth daily.  Marland Kitchen atorvastatin (LIPITOR) 80 MG tablet Take 1 tablet (80 mg total) by mouth daily at 6 PM.  . BYSTOLIC 10 MG tablet TAKE ONE TABLET BY MOUTH ONCE DAILY  . clopidogrel (PLAVIX) 75 MG tablet Take 1 tablet (75 mg total) by mouth daily with breakfast.  . FERREX 150 150 MG capsule TAKE ONE CAPSULE BY MOUTH TWICE DAILY  . fluticasone (FLOVENT HFA) 110 MCG/ACT inhaler Inhale 2 puffs into the lungs 2 (two) times daily.  .  furosemide (LASIX) 40 MG tablet TAKE ONE AND ONE-HALF TABLETS BY MOUTH TWICE DAILY  . isosorbide mononitrate (IMDUR) 30 MG 24 hr tablet Take 1 tablet (30 mg total) by mouth daily.  . Meclizine HCl (BONINE) 25 MG CHEW Chew 1 tablet (25 mg total) by mouth 2 (two) times daily as needed (for vertigo).  . metolazone (ZAROXOLYN) 2.5 MG tablet Take 2.5 mg by mouth. ON Monday, Wednesday and Friday mornings.  . montelukast (SINGULAIR) 10 MG tablet Take 10 mg by mouth at bedtime.  . Multiple Vitamins-Minerals (MULTIVITAMIN & MINERAL PO) Take 1 tablet by mouth daily.    . nitroGLYCERIN (NITROSTAT) 0.4 MG SL tablet Place 1 tablet (0.4 mg total) under the tongue every 5 (five) minutes as needed for chest pain.  . pantoprazole (PROTONIX) 40 MG tablet TAKE TWO TABLETS BY MOUTH TWICE DAILY  . potassium chloride SA (K-DUR,KLOR-CON) 20 MEQ tablet Take 1 tablet (20 mEq total) by mouth 2 (two) times daily.  Marland Kitchen tiotropium (SPIRIVA) 18 MCG inhalation capsule Place 1 capsule (18 mcg total) into inhaler and inhale daily.  . [DISCONTINUED] predniSONE (DELTASONE) 10 MG tablet 4 tabs for 2 days, then 3 tabs for 2 days, 2 tabs for 2 days, then 1 tab for 2 days, then stop  . hydrALAZINE (APRESOLINE) 25 MG tablet Take 1 tablet (25 mg total) by mouth 3 (three) times daily. (Patient not taking: Reported on 08/14/2014)

## 2014-08-14 NOTE — Patient Instructions (Signed)
No change in medications. Return in         4 months 

## 2014-08-14 NOTE — Assessment & Plan Note (Signed)
Chronic obstructive lung disease stable at present  S/p recent exacerbation of same Plan No change in inhaled or maintenance medications. Return in  4 months

## 2014-08-21 ENCOUNTER — Telehealth: Payer: Self-pay | Admitting: Cardiovascular Disease

## 2014-08-21 NOTE — Telephone Encounter (Signed)
Left message for patient to call back  

## 2014-08-21 NOTE — Telephone Encounter (Signed)
Attempted to call patient again at approximately 11:00 Left message for Silas [place], Nurse Case Manager with [facility] that I attempted x 2 to call patient and did not get a call back.  I advised patient to call me back tomorrow

## 2014-08-21 NOTE — Telephone Encounter (Signed)
New Message  Per Beth @ [facility] case management called to report- swelling, tightness in abdomen, cough and 'some' SoB. She requested that a Rn follow up w/ pt. Please call back and discuss.

## 2014-08-22 NOTE — Telephone Encounter (Signed)
Called and spoke with patient who reports weight 284 lb today; reports weight yesterday was 290 lb Patient states he took his regular scheduled dose of Zaroxolyn yesterday and he notes improvement in leg swelling today; patient states he is always SOB, states it is not worse today Patient reports good urine output since taking Zaroxolyn and states he will probably improve even more when he takes another dose tomorrow (Friday).  I advised patient to call back if he does not note improvement or if he has worsening SOB, swelling, or weight gain.  Patient verbalized understanding and agreement.

## 2014-09-18 ENCOUNTER — Telehealth: Payer: Self-pay | Admitting: Critical Care Medicine

## 2014-09-18 NOTE — Telephone Encounter (Signed)
Pt has been scheduled with TP tomorrow at 3:45pm per Crystal.

## 2014-09-19 ENCOUNTER — Encounter: Payer: Self-pay | Admitting: Adult Health

## 2014-09-19 ENCOUNTER — Ambulatory Visit (INDEPENDENT_AMBULATORY_CARE_PROVIDER_SITE_OTHER): Payer: Medicare Other | Admitting: Adult Health

## 2014-09-19 VITALS — BP 110/70 | HR 67 | Temp 97.8°F | Ht 71.0 in | Wt 289.6 lb

## 2014-09-19 DIAGNOSIS — I5032 Chronic diastolic (congestive) heart failure: Secondary | ICD-10-CM

## 2014-09-19 DIAGNOSIS — J449 Chronic obstructive pulmonary disease, unspecified: Secondary | ICD-10-CM

## 2014-09-19 MED ORDER — LEVALBUTEROL HCL 0.63 MG/3ML IN NEBU
0.6300 mg | INHALATION_SOLUTION | Freq: Once | RESPIRATORY_TRACT | Status: AC
  Administered 2014-09-19: 0.63 mg via RESPIRATORY_TRACT

## 2014-09-19 MED ORDER — METHYLPREDNISOLONE ACETATE 80 MG/ML IJ SUSP
120.0000 mg | Freq: Once | INTRAMUSCULAR | Status: AC
  Administered 2014-09-19: 120 mg via INTRAMUSCULAR

## 2014-09-19 MED ORDER — AZITHROMYCIN 250 MG PO TABS: ORAL_TABLET | ORAL | Status: DC

## 2014-09-19 NOTE — Addendum Note (Signed)
Addended by: Levander Campion on: 09/19/2014 04:45 PM   Modules accepted: Orders

## 2014-09-19 NOTE — Assessment & Plan Note (Signed)
Exacerbation  xopenex x 1  Depo medrol 120 mg IM x 1   Plan  Zpack take as directed.  Mucinex DM twice daily as needed for cough and congestion. Use oxygen 2 L with activity and at bedtime Take your Flovent 2 puffs twice daily, rinse after use. Take Spiriva 1 puff daily Follow up Dr. Joya Gaskins  As planned and As needed   Please contact office for sooner follow up if symptoms do not improve or worsen or seek emergency care

## 2014-09-19 NOTE — Progress Notes (Signed)
   Subjective:    Patient ID: [name], male    DOB: Dec 10, 1930, 81 y.o.   MRN: [id number]  HPI 81 yo male with chronic bronchitis   09/19/2014 Acute OV  Complains of increased dyspnea, cough, congestion, wheezing and tightness for 2 weeks.  Taking mucinex. . Denies f/c/s, hemoptysis,   chest pain or orthopnea. No increased leg swelling.   Remains on Flovent and Spiriva. -says he taking correctly.  Remains on on Oxygen with act and At bedtime   No recent ER or hospitalizations    Review of Systems Constitutional:   No  weight loss, night sweats,  Fevers, chills,  +fatigue, or  lassitude.  HEENT:   No headaches,  Difficulty swallowing,  Tooth/dental problems, or  Sore throat,                No sneezing, itching, ear ache,  +nasal congestion, post nasal drip,   CV:  No chest pain,  Orthopnea, PND,   syncope.   GI  No heartburn, indigestion, abdominal pain, nausea, vomiting, diarrhea, change in bowel habits, loss of appetite, bloody stools.   Resp:    No chest wall deformity  Skin: no rash or lesions.  GU: no dysuria, change in color of urine, no urgency or frequency.  No flank pain, no hematuria   MS:  No joint pain or swelling.  No decreased range of motion.  No back pain.  Psych:  No change in mood or affect. No depression or anxiety.  No memory loss.         Objective:   Physical Exam GEN: A/Ox3; pleasant , NAD, obese   HEENT:  [place]/AT,  EACs-clear, TMs-wnl, NOSE-clear, THROAT-clear, no lesions, no postnasal drip or exudate noted.   NECK:  Supple w/ fair ROM; no JVD; normal carotid impulses w/o bruits; no thyromegaly or nodules palpated; no lymphadenopathy.  RESP  Coarse Rhonchi bilaterally  no accessory muscle use, no dullness to percussion  CARD:  RRR, no m/r/g  , tr  peripheral edema, pulses intact, no cyanosis or clubbing.  GI:   Soft & nt; nml bowel sounds; no organomegaly or masses detected.  Musco: Warm bil, no deformities or joint swelling noted.    Neuro: alert, no focal deficits noted.    Skin: Warm, no lesions or rashes         Assessment & Plan:

## 2014-09-19 NOTE — Assessment & Plan Note (Signed)
Appears compensated on w/out evidence of volume overload .

## 2014-09-19 NOTE — Addendum Note (Signed)
Addended by: Levander Campion on: 09/19/2014 05:23 PM   Modules accepted: Orders

## 2014-09-19 NOTE — Patient Instructions (Addendum)
Zpack take as directed.  Mucinex DM twice daily as needed for cough and congestion. Use oxygen 2 L with activity and at bedtime Take your Flovent 2 puffs twice daily, rinse after use. Take Spiriva 1 puff daily Follow up Dr. Joya Gaskins  As planned and As needed   Please contact office for sooner follow up if symptoms do not improve or worsen or seek emergency care

## 2014-09-27 ENCOUNTER — Ambulatory Visit (INDEPENDENT_AMBULATORY_CARE_PROVIDER_SITE_OTHER): Payer: Medicare Other | Admitting: Adult Health

## 2014-09-27 ENCOUNTER — Ambulatory Visit (INDEPENDENT_AMBULATORY_CARE_PROVIDER_SITE_OTHER)
Admission: RE | Admit: 2014-09-27 | Discharge: 2014-09-27 | Disposition: A | Discharge status: Home / Self Care | Payer: Medicare Other | Source: Ambulatory Visit | Attending: Adult Health | Admitting: Adult Health

## 2014-09-27 ENCOUNTER — Encounter: Payer: Self-pay | Admitting: Adult Health

## 2014-09-27 ENCOUNTER — Telehealth: Payer: Self-pay | Admitting: Critical Care Medicine

## 2014-09-27 VITALS — BP 100/60 | HR 94 | Temp 97.4°F | Ht 72.0 in | Wt 288.0 lb

## 2014-09-27 DIAGNOSIS — J209 Acute bronchitis, unspecified: Secondary | ICD-10-CM

## 2014-09-27 DIAGNOSIS — J449 Chronic obstructive pulmonary disease, unspecified: Secondary | ICD-10-CM

## 2014-09-27 MED ORDER — PREDNISONE 10 MG PO TABS: ORAL_TABLET | ORAL | Status: DC

## 2014-09-27 MED ORDER — LEVOFLOXACIN 500 MG PO TABS: 500.0000 mg | ORAL_TABLET | Freq: Every day | ORAL | Status: AC

## 2014-09-27 MED ORDER — LEVALBUTEROL HCL 0.63 MG/3ML IN NEBU
0.6300 mg | INHALATION_SOLUTION | Freq: Once | RESPIRATORY_TRACT | Status: AC
  Administered 2014-09-27: 0.63 mg via RESPIRATORY_TRACT

## 2014-09-27 NOTE — Telephone Encounter (Signed)
Pt calling for OV today.  Pt scheduled with TP at 3:15 C/o increased SOB - to increase O2 to 2.5L/min  Wheezing and raspy While patient was talking you could hear a gurgling sound  Pt has been unable to sleep  Denies CP/tightness Pt reports having a 16oz bottle half filled with mucus that he has coughed up in a 2 day period.  Pt advised to contact us if symptoms worsen before appt this PM. Pt also aware to call 911 if symptoms become severe.  Expressed understanding. Nothing further needed.

## 2014-09-27 NOTE — Patient Instructions (Addendum)
Levaquin 500mg  daily for 7 days  Prednisone taper. Over next  week Mucinex DM twice daily as needed for cough and congestion. We are setting you up for a CT sinus  Chest xray today .  If you are not improving will need admission to hospital.  Follow Dr. Joya Gaskins in 2  weeks and as needed. Please contact office for sooner follow up if symptoms do not improve or worsen or seek emergency care

## 2014-10-02 ENCOUNTER — Ambulatory Visit (INDEPENDENT_AMBULATORY_CARE_PROVIDER_SITE_OTHER)
Admission: RE | Admit: 2014-10-02 | Discharge: 2014-10-02 | Disposition: A | Discharge status: Home / Self Care | Payer: Medicare Other | Source: Ambulatory Visit | Attending: Adult Health | Admitting: Adult Health

## 2014-10-02 DIAGNOSIS — J449 Chronic obstructive pulmonary disease, unspecified: Secondary | ICD-10-CM

## 2014-10-03 ENCOUNTER — Inpatient Hospital Stay: Admission: RE | Admit: 2014-10-03 | Payer: Medicare Other | Source: Ambulatory Visit

## 2014-10-04 ENCOUNTER — Emergency Department (HOSPITAL_COMMUNITY): Payer: Medicare Other

## 2014-10-04 ENCOUNTER — Encounter (HOSPITAL_COMMUNITY): Payer: Self-pay | Admitting: Emergency Medicine

## 2014-10-04 ENCOUNTER — Inpatient Hospital Stay (HOSPITAL_COMMUNITY)
Admission: EM | Admit: 2014-10-04 | Discharge: 2014-10-13 | DRG: 190 | Disposition: A | Discharge status: Home Health | Payer: Medicare Other | Attending: Internal Medicine | Admitting: Internal Medicine

## 2014-10-04 DIAGNOSIS — E785 Hyperlipidemia, unspecified: Secondary | ICD-10-CM | POA: Diagnosis present

## 2014-10-04 DIAGNOSIS — R072 Precordial pain: Secondary | ICD-10-CM | POA: Diagnosis not present

## 2014-10-04 DIAGNOSIS — I2510 Atherosclerotic heart disease of native coronary artery without angina pectoris: Secondary | ICD-10-CM | POA: Diagnosis present

## 2014-10-04 DIAGNOSIS — Z955 Presence of coronary angioplasty implant and graft: Secondary | ICD-10-CM | POA: Diagnosis not present

## 2014-10-04 DIAGNOSIS — K219 Gastro-esophageal reflux disease without esophagitis: Secondary | ICD-10-CM | POA: Diagnosis present

## 2014-10-04 DIAGNOSIS — N183 Chronic kidney disease, stage 3 unspecified: Secondary | ICD-10-CM | POA: Diagnosis present

## 2014-10-04 DIAGNOSIS — E875 Hyperkalemia: Secondary | ICD-10-CM | POA: Diagnosis not present

## 2014-10-04 DIAGNOSIS — B370 Candidal stomatitis: Secondary | ICD-10-CM | POA: Diagnosis not present

## 2014-10-04 DIAGNOSIS — Z23 Encounter for immunization: Secondary | ICD-10-CM | POA: Diagnosis not present

## 2014-10-04 DIAGNOSIS — E039 Hypothyroidism, unspecified: Secondary | ICD-10-CM | POA: Diagnosis present

## 2014-10-04 DIAGNOSIS — J42 Unspecified chronic bronchitis: Secondary | ICD-10-CM

## 2014-10-04 DIAGNOSIS — Z8249 Family history of ischemic heart disease and other diseases of the circulatory system: Secondary | ICD-10-CM

## 2014-10-04 DIAGNOSIS — R079 Chest pain, unspecified: Secondary | ICD-10-CM | POA: Diagnosis not present

## 2014-10-04 DIAGNOSIS — J9621 Acute and chronic respiratory failure with hypoxia: Secondary | ICD-10-CM | POA: Diagnosis present

## 2014-10-04 DIAGNOSIS — E876 Hypokalemia: Secondary | ICD-10-CM | POA: Diagnosis not present

## 2014-10-04 DIAGNOSIS — Z91018 Allergy to other foods: Secondary | ICD-10-CM

## 2014-10-04 DIAGNOSIS — Z9981 Dependence on supplemental oxygen: Secondary | ICD-10-CM

## 2014-10-04 DIAGNOSIS — T380X5A Adverse effect of glucocorticoids and synthetic analogues, initial encounter: Secondary | ICD-10-CM | POA: Diagnosis not present

## 2014-10-04 DIAGNOSIS — Z7902 Long term (current) use of antithrombotics/antiplatelets: Secondary | ICD-10-CM | POA: Diagnosis not present

## 2014-10-04 DIAGNOSIS — Z79899 Other long term (current) drug therapy: Secondary | ICD-10-CM

## 2014-10-04 DIAGNOSIS — J441 Chronic obstructive pulmonary disease with (acute) exacerbation: Principal | ICD-10-CM | POA: Diagnosis present

## 2014-10-04 DIAGNOSIS — J9811 Atelectasis: Secondary | ICD-10-CM | POA: Diagnosis present

## 2014-10-04 DIAGNOSIS — I498 Other specified cardiac arrhythmias: Secondary | ICD-10-CM | POA: Diagnosis present

## 2014-10-04 DIAGNOSIS — Z951 Presence of aortocoronary bypass graft: Secondary | ICD-10-CM

## 2014-10-04 DIAGNOSIS — I4891 Unspecified atrial fibrillation: Secondary | ICD-10-CM | POA: Diagnosis present

## 2014-10-04 DIAGNOSIS — I252 Old myocardial infarction: Secondary | ICD-10-CM | POA: Diagnosis not present

## 2014-10-04 DIAGNOSIS — I5032 Chronic diastolic (congestive) heart failure: Secondary | ICD-10-CM | POA: Diagnosis present

## 2014-10-04 DIAGNOSIS — R0602 Shortness of breath: Secondary | ICD-10-CM

## 2014-10-04 DIAGNOSIS — I480 Paroxysmal atrial fibrillation: Secondary | ICD-10-CM | POA: Diagnosis present

## 2014-10-04 DIAGNOSIS — I129 Hypertensive chronic kidney disease with stage 1 through stage 4 chronic kidney disease, or unspecified chronic kidney disease: Secondary | ICD-10-CM | POA: Diagnosis present

## 2014-10-04 DIAGNOSIS — H9190 Unspecified hearing loss, unspecified ear: Secondary | ICD-10-CM | POA: Diagnosis present

## 2014-10-04 DIAGNOSIS — N179 Acute kidney failure, unspecified: Secondary | ICD-10-CM | POA: Diagnosis present

## 2014-10-04 DIAGNOSIS — M1990 Unspecified osteoarthritis, unspecified site: Secondary | ICD-10-CM | POA: Diagnosis present

## 2014-10-04 DIAGNOSIS — I5033 Acute on chronic diastolic (congestive) heart failure: Secondary | ICD-10-CM | POA: Diagnosis not present

## 2014-10-04 DIAGNOSIS — Z6838 Body mass index (BMI) 38.0-38.9, adult: Secondary | ICD-10-CM

## 2014-10-04 DIAGNOSIS — Z7982 Long term (current) use of aspirin: Secondary | ICD-10-CM

## 2014-10-04 DIAGNOSIS — J323 Chronic sphenoidal sinusitis: Secondary | ICD-10-CM | POA: Diagnosis present

## 2014-10-04 DIAGNOSIS — J449 Chronic obstructive pulmonary disease, unspecified: Secondary | ICD-10-CM

## 2014-10-04 LAB — BASIC METABOLIC PANEL
ANION GAP: 9 (ref 5–15)
Anion gap: 11 (ref 5–15)
BUN: 45 mg/dL — AB (ref 6–23)
BUN: 49 mg/dL — AB (ref 6–23)
CHLORIDE: 101 mmol/L (ref 96–112)
CO2: 30 mmol/L (ref 19–32)
CO2: 33 mmol/L — AB (ref 19–32)
CREATININE: 2.39 mg/dL — AB (ref 0.50–1.35)
Calcium: 9.2 mg/dL (ref 8.4–10.5)
Calcium: 9.1 mg/dL (ref 8.4–10.5)
Chloride: 98 mmol/L (ref 96–112)
Creatinine, Ser: 2.43 mg/dL — ABNORMAL HIGH (ref 0.50–1.35)
GFR calc Af Amer: 27 mL/min — ABNORMAL LOW (ref >90)
GFR calc non Af Amer: 23 mL/min — ABNORMAL LOW (ref >90)
GFR, EST AFRICAN AMERICAN: 27 mL/min — AB (ref >90)
GFR, EST NON AFRICAN AMERICAN: 24 mL/min — AB (ref >90)
Glucose, Bld: 123 mg/dL — ABNORMAL HIGH (ref 70–99)
Glucose, Bld: 136 mg/dL — ABNORMAL HIGH (ref 70–99)
POTASSIUM: 4.5 mmol/L (ref 3.5–5.1)
Potassium: 3.9 mmol/L (ref 3.5–5.1)
Sodium: 142 mmol/L (ref 135–145)
Sodium: 140 mmol/L (ref 135–145)

## 2014-10-04 LAB — I-STAT TROPONIN, ED: Troponin i, poc: 0.01 ng/mL (ref 0.00–0.08)

## 2014-10-04 LAB — CBC WITH DIFFERENTIAL/PLATELET
BASOS ABS: 0 10*3/uL (ref 0.0–0.1)
Basophils Absolute: 0 10*3/uL (ref 0.0–0.1)
Basophils Relative: 0 % (ref 0–1)
Basophils Relative: 0 % (ref 0–1)
EOS PCT: 0 % (ref 0–5)
EOS PCT: 0 % (ref 0–5)
Eosinophils Absolute: 0 10*3/uL (ref 0.0–0.7)
Eosinophils Absolute: 0 10*3/uL (ref 0.0–0.7)
HEMATOCRIT: 46.2 % (ref 39.0–52.0)
HEMATOCRIT: 47.7 % (ref 39.0–52.0)
HEMOGLOBIN: 15 g/dL (ref 13.0–17.0)
Hemoglobin: 15.7 g/dL (ref 13.0–17.0)
LYMPHS ABS: 2.3 10*3/uL (ref 0.7–4.0)
LYMPHS ABS: 2.7 10*3/uL (ref 0.7–4.0)
Lymphocytes Relative: 31 % (ref 12–46)
Lymphocytes Relative: 34 % (ref 12–46)
MCH: 30.5 pg (ref 26.0–34.0)
MCH: 30.8 pg (ref 26.0–34.0)
MCHC: 32.5 g/dL (ref 30.0–36.0)
MCHC: 32.9 g/dL (ref 30.0–36.0)
MCV: 93.5 fL (ref 78.0–100.0)
MCV: 93.9 fL (ref 78.0–100.0)
MONO ABS: 1 10*3/uL (ref 0.1–1.0)
Monocytes Absolute: 0.9 10*3/uL (ref 0.1–1.0)
Monocytes Relative: 14 % — ABNORMAL HIGH (ref 3–12)
Monocytes Relative: 12 % (ref 3–12)
NEUTROS PCT: 54 % (ref 43–77)
Neutro Abs: 4 10*3/uL (ref 1.7–7.7)
Neutro Abs: 4.2 10*3/uL (ref 1.7–7.7)
Neutrophils Relative %: 55 % (ref 43–77)
Platelets: 189 10*3/uL (ref 150–400)
Platelets: 191 10*3/uL (ref 150–400)
RBC: 4.92 MIL/uL (ref 4.22–5.81)
RBC: 5.1 MIL/uL (ref 4.22–5.81)
RDW: 14 % (ref 11.5–15.5)
RDW: 14 % (ref 11.5–15.5)
WBC: 7.4 10*3/uL (ref 4.0–10.5)
WBC: 7.8 10*3/uL (ref 4.0–10.5)

## 2014-10-04 LAB — COMPREHENSIVE METABOLIC PANEL
ALT: 23 U/L (ref 0–53)
ANION GAP: 9 (ref 5–15)
AST: 58 U/L — AB (ref 0–37)
Albumin: 3.5 g/dL (ref 3.5–5.2)
Alkaline Phosphatase: 74 U/L (ref 39–117)
BUN: 46 mg/dL — AB (ref 6–23)
CHLORIDE: 99 mmol/L (ref 96–112)
CO2: 33 mmol/L — ABNORMAL HIGH (ref 19–32)
CREATININE: 2.44 mg/dL — AB (ref 0.50–1.35)
Calcium: 9.1 mg/dL (ref 8.4–10.5)
GFR calc Af Amer: 27 mL/min — ABNORMAL LOW (ref >90)
GFR, EST NON AFRICAN AMERICAN: 23 mL/min — AB (ref >90)
GLUCOSE: 106 mg/dL — AB (ref 70–99)
Potassium: 5.3 mmol/L — ABNORMAL HIGH (ref 3.5–5.1)
Sodium: 141 mmol/L (ref 135–145)
Total Bilirubin: 2.2 mg/dL — ABNORMAL HIGH (ref 0.3–1.2)
Total Protein: 6.6 g/dL (ref 6.0–8.3)

## 2014-10-04 LAB — GLUCOSE, CAPILLARY
GLUCOSE-CAPILLARY: 113 mg/dL — AB (ref 70–99)
GLUCOSE-CAPILLARY: 126 mg/dL — AB (ref 70–99)

## 2014-10-04 LAB — TSH: TSH: 2.708 u[IU]/mL (ref 0.350–4.500)

## 2014-10-04 LAB — BRAIN NATRIURETIC PEPTIDE: B Natriuretic Peptide: 138.5 pg/mL — ABNORMAL HIGH (ref 0.0–100.0)

## 2014-10-04 LAB — TROPONIN I
TROPONIN I: 0.06 ng/mL — AB (ref <0.031)
Troponin I: 0.05 ng/mL — ABNORMAL HIGH (ref <0.031)
Troponin I: 0.06 ng/mL — ABNORMAL HIGH (ref <0.031)

## 2014-10-04 MED ORDER — ASPIRIN EC 81 MG PO TBEC
81.0000 mg | DELAYED_RELEASE_TABLET | Freq: Every day | ORAL | Status: DC
  Administered 2014-10-04 – 2014-10-13 (×10): 81 mg via ORAL
  Filled 2014-10-04 (×10): qty 1

## 2014-10-04 MED ORDER — ATORVASTATIN CALCIUM 80 MG PO TABS
80.0000 mg | ORAL_TABLET | Freq: Every day | ORAL | Status: DC
  Administered 2014-10-04 – 2014-10-12 (×9): 80 mg via ORAL
  Filled 2014-10-04 (×10): qty 1

## 2014-10-04 MED ORDER — ACETAMINOPHEN 325 MG PO TABS: 650.0000 mg | ORAL_TABLET | Freq: Four times a day (QID) | ORAL | Status: DC | PRN

## 2014-10-04 MED ORDER — METOLAZONE 2.5 MG PO TABS
2.5000 mg | ORAL_TABLET | ORAL | Status: DC
  Administered 2014-10-04: 2.5 mg via ORAL
  Filled 2014-10-04 (×2): qty 1

## 2014-10-04 MED ORDER — ACETAMINOPHEN 650 MG RE SUPP: 650.0000 mg | Freq: Four times a day (QID) | RECTAL | Status: DC | PRN

## 2014-10-04 MED ORDER — PANTOPRAZOLE SODIUM 40 MG PO TBEC
80.0000 mg | DELAYED_RELEASE_TABLET | Freq: Two times a day (BID) | ORAL | Status: DC
  Administered 2014-10-04 – 2014-10-13 (×19): 80 mg via ORAL
  Filled 2014-10-04 (×19): qty 2

## 2014-10-04 MED ORDER — LEVALBUTEROL HCL 0.63 MG/3ML IN NEBU
0.6300 mg | INHALATION_SOLUTION | Freq: Four times a day (QID) | RESPIRATORY_TRACT | Status: DC
  Administered 2014-10-04 – 2014-10-05 (×4): 0.63 mg via RESPIRATORY_TRACT
  Filled 2014-10-04 (×8): qty 3

## 2014-10-04 MED ORDER — NITROGLYCERIN 0.4 MG SL SUBL
0.4000 mg | SUBLINGUAL_TABLET | SUBLINGUAL | Status: DC | PRN
  Administered 2014-10-04: 0.4 mg via SUBLINGUAL
  Filled 2014-10-04: qty 1

## 2014-10-04 MED ORDER — MONTELUKAST SODIUM 10 MG PO TABS
10.0000 mg | ORAL_TABLET | Freq: Every day | ORAL | Status: DC
  Administered 2014-10-04 – 2014-10-12 (×9): 10 mg via ORAL
  Filled 2014-10-04 (×11): qty 1

## 2014-10-04 MED ORDER — ENOXAPARIN SODIUM 40 MG/0.4ML ~~LOC~~ SOLN
40.0000 mg | SUBCUTANEOUS | Status: DC
  Administered 2014-10-04 – 2014-10-06 (×3): 40 mg via SUBCUTANEOUS
  Filled 2014-10-04 (×4): qty 0.4

## 2014-10-04 MED ORDER — POTASSIUM CHLORIDE CRYS ER 20 MEQ PO TBCR: 20.0000 meq | EXTENDED_RELEASE_TABLET | Freq: Two times a day (BID) | ORAL | Status: DC

## 2014-10-04 MED ORDER — FUROSEMIDE 40 MG PO TABS
40.0000 mg | ORAL_TABLET | Freq: Two times a day (BID) | ORAL | Status: DC
  Administered 2014-10-04 – 2014-10-06 (×6): 40 mg via ORAL
  Filled 2014-10-04 (×9): qty 1

## 2014-10-04 MED ORDER — CLOPIDOGREL BISULFATE 75 MG PO TABS
75.0000 mg | ORAL_TABLET | Freq: Every day | ORAL | Status: DC
  Administered 2014-10-04 – 2014-10-13 (×10): 75 mg via ORAL
  Filled 2014-10-04 (×10): qty 1

## 2014-10-04 MED ORDER — IPRATROPIUM-ALBUTEROL 0.5-2.5 (3) MG/3ML IN SOLN
3.0000 mL | Freq: Once | RESPIRATORY_TRACT | Status: AC
  Administered 2014-10-04: 3 mL via RESPIRATORY_TRACT
  Filled 2014-10-04: qty 3

## 2014-10-04 MED ORDER — NITROGLYCERIN 0.4 MG SL SUBL: 0.4000 mg | SUBLINGUAL_TABLET | SUBLINGUAL | Status: DC | PRN

## 2014-10-04 MED ORDER — ONDANSETRON HCL 4 MG/2ML IJ SOLN: 4.0000 mg | Freq: Four times a day (QID) | INTRAMUSCULAR | Status: DC | PRN

## 2014-10-04 MED ORDER — FUROSEMIDE 10 MG/ML IJ SOLN
80.0000 mg | Freq: Once | INTRAMUSCULAR | Status: AC
  Administered 2014-10-04: 80 mg via INTRAVENOUS
  Filled 2014-10-04: qty 8

## 2014-10-04 MED ORDER — NEBIVOLOL HCL 10 MG PO TABS
10.0000 mg | ORAL_TABLET | Freq: Every day | ORAL | Status: DC
  Administered 2014-10-04 – 2014-10-13 (×10): 10 mg via ORAL
  Filled 2014-10-04 (×10): qty 1

## 2014-10-04 MED ORDER — METHYLPREDNISOLONE SODIUM SUCC 40 MG IJ SOLR
40.0000 mg | Freq: Every day | INTRAMUSCULAR | Status: DC
  Administered 2014-10-04 – 2014-10-05 (×2): 40 mg via INTRAVENOUS
  Filled 2014-10-04 (×2): qty 1

## 2014-10-04 MED ORDER — FUROSEMIDE 40 MG PO TABS
40.0000 mg | ORAL_TABLET | Freq: Two times a day (BID) | ORAL | Status: DC
  Filled 2014-10-04 (×3): qty 1

## 2014-10-04 MED ORDER — FUROSEMIDE 10 MG/ML IJ SOLN: 40.0000 mg | Freq: Once | INTRAMUSCULAR | Status: DC

## 2014-10-04 MED ORDER — CETYLPYRIDINIUM CHLORIDE 0.05 % MT LIQD
7.0000 mL | Freq: Two times a day (BID) | OROMUCOSAL | Status: DC
  Administered 2014-10-04 – 2014-10-13 (×18): 7 mL via OROMUCOSAL

## 2014-10-04 MED ORDER — FUROSEMIDE 10 MG/ML IJ SOLN: 40.0000 mg | Freq: Two times a day (BID) | INTRAMUSCULAR | Status: DC

## 2014-10-04 MED ORDER — IPRATROPIUM BROMIDE 0.02 % IN SOLN
0.5000 mg | Freq: Four times a day (QID) | RESPIRATORY_TRACT | Status: DC
  Administered 2014-10-04 – 2014-10-05 (×4): 0.5 mg via RESPIRATORY_TRACT
  Filled 2014-10-04 (×4): qty 2.5

## 2014-10-04 MED ORDER — HYDRALAZINE HCL 25 MG PO TABS
25.0000 mg | ORAL_TABLET | Freq: Three times a day (TID) | ORAL | Status: DC
  Administered 2014-10-04 – 2014-10-13 (×28): 25 mg via ORAL
  Filled 2014-10-04 (×31): qty 1

## 2014-10-04 MED ORDER — POLYSACCHARIDE IRON COMPLEX 150 MG PO CAPS
150.0000 mg | ORAL_CAPSULE | Freq: Two times a day (BID) | ORAL | Status: DC
  Administered 2014-10-04 – 2014-10-13 (×19): 150 mg via ORAL
  Filled 2014-10-04 (×24): qty 1

## 2014-10-04 MED ORDER — SODIUM CHLORIDE 0.9 % IJ SOLN
3.0000 mL | Freq: Two times a day (BID) | INTRAMUSCULAR | Status: DC
  Administered 2014-10-04 – 2014-10-13 (×19): 3 mL via INTRAVENOUS

## 2014-10-04 MED ORDER — FLUTICASONE PROPIONATE HFA 110 MCG/ACT IN AERO
2.0000 | INHALATION_SPRAY | Freq: Two times a day (BID) | RESPIRATORY_TRACT | Status: DC
Start: 2014-10-04 — End: 2014-10-04

## 2014-10-04 MED ORDER — BUDESONIDE 0.25 MG/2ML IN SUSP
0.2500 mg | Freq: Two times a day (BID) | RESPIRATORY_TRACT | Status: DC
  Administered 2014-10-04 – 2014-10-05 (×2): 0.25 mg via RESPIRATORY_TRACT
  Filled 2014-10-04 (×5): qty 2

## 2014-10-04 MED ORDER — ONDANSETRON HCL 4 MG PO TABS: 4.0000 mg | ORAL_TABLET | Freq: Four times a day (QID) | ORAL | Status: DC | PRN

## 2014-10-04 MED ORDER — DOXYCYCLINE HYCLATE 100 MG IV SOLR
100.0000 mg | Freq: Two times a day (BID) | INTRAVENOUS | Status: DC
  Administered 2014-10-04 – 2014-10-07 (×7): 100 mg via INTRAVENOUS
  Filled 2014-10-04 (×8): qty 100

## 2014-10-04 MED ORDER — LEVALBUTEROL HCL 0.63 MG/3ML IN NEBU
0.6300 mg | INHALATION_SOLUTION | Freq: Four times a day (QID) | RESPIRATORY_TRACT | Status: DC | PRN
  Administered 2014-10-08: 0.63 mg via RESPIRATORY_TRACT
  Filled 2014-10-04 (×2): qty 3

## 2014-10-04 MED ORDER — ISOSORBIDE MONONITRATE ER 30 MG PO TB24
30.0000 mg | ORAL_TABLET | Freq: Every day | ORAL | Status: DC
  Administered 2014-10-04 – 2014-10-05 (×2): 30 mg via ORAL
  Filled 2014-10-04 (×3): qty 1

## 2014-10-04 NOTE — Progress Notes (Signed)
81 year old male with h/o CAD admitted earlier today for left sided chest pain worse on deep inspiration . He was admitted for evaluation of ACS and for the management of copd exacerbation. He was started on IV steroids, nebs and doxycycline. Mildly elevated troponins probably from renal insufficiency, EKG shows atrial fibrillation and there are no signs of ischemia. Cardiology consulted and recommendations given.  pateint examined.    Hosie Poisson, MD [phone]

## 2014-10-04 NOTE — Progress Notes (Signed)
10/04/2014 1600 Chart reviewed. UR completed. Jonnie Finner RN CCM Case Mgmt phone [phone]

## 2014-10-04 NOTE — H&P (Signed)
[facility] History and Physical  HERVEY WEDIG PPJ:[id number] DOB: [birth year] DOA: 10/04/2014  Referring physician: ER physician. PCP:  Melinda Crutch, MD   Chief Complaint: Chest pain.  HPI: [name] is a 81 y.o. male with history of CAD status post CABG and drug-eluting stent in 5809, diastolic CHF last EF measured in January 2015 was 50-55%, COPD, chronic kidney disease stage III, hypertension, obesity presents to the ER because of chest pain. Patient has been having shortness of breath with wheezing and has been treated with multiple doses of antibiotics over the last 6 weeks and tapering dose of steroids which was over yesterday. Patient last night started developing chest pain which was retrosternal and lasted for 15 minutes and got better with sublingual nitroglycerin. In the ER cardiac markers were negative and EKG showed atrial fibrillation which was new onset. Rate was controlled. Chest x-ray showed some edema with pleural effusion and patient was given Lasix. Patient has been admitted for further management of his chest pain shortness of breath which most likely is from COPD. Patients baseline weight is around 280 pounds and patients today is around 276 pounds. Patient states is actually losing weight. Patient denies any nausea vomiting abdominal pain or diarrhea.   Review of Systems: As presented in the history of presenting illness, rest negative.  Past Medical History  Diagnosis Date  . CAD (coronary artery disease)     a. s/p CABG x 3 in 1995; b.  NSTEMI 12/2011 -> Cath 6/21: LM 20-30, LAD 90, RI 95, LCX 28m, RCA 90/131m (treated w/ 2.0x16 Promus DES), VG->RCA ok, VG->RI 70-59m (treated w/ 4.0x28 Promus), LIMA->LAD ok, EF 55-60%.  Marland Kitchen HTN (hypertension)   . HLD (hyperlipidemia)   . Morbid obesity   . Syncope   . COPD (chronic obstructive pulmonary disease)     a. on home O2  . HOH (hard of hearing)     left ear  . Asthma   . Sleep apnea   . Chronic diastolic CHF  (congestive heart failure)     a. Echo 10/2011: EF 55-60%;  b. elevated EDP req diuresis.  . Chronic respiratory failure 07/11/2013    a. 3L home O2 24/7.  Marland Kitchen Orthopnea     a. Has been sleeping in a recliner x 30 yrs.  . Hypothyroidism   . GERD (gastroesophageal reflux disease)   . Arthritis     "knees; hands" (07/11/2013)  . GI bleed     a. 06/2013 adm - prepyloric ulcer, path neg for malignancy. A/w ABL anemia.  . Acute blood loss anemia     a. 06/2013 due to GIB.  . CKD (chronic kidney disease)     a. ?Based on Cr 2013 - 1.3-1.7.   Past Surgical History  Procedure Laterality Date  . Functional endoscopic sinus surgery  2006  . Hydrocele excision  2005  . Prostate surgery  1998  . Excisional hemorrhoidectomy      "twice cut them out; burnt them out once"  . Coronary angioplasty with stent placement  12/2011  . Coronary artery bypass graft  1995    CABG X3  . Inguinal hernia repair Right 2006  . Knee arthroscopy Right 1990's  . Back surgery  1952    "when I was in the [place]" (07/11/2013)  . Esophagogastroduodenoscopy N/A 07/12/2013    Procedure: ESOPHAGOGASTRODUODENOSCOPY (EGD);  Surgeon: Inda Castle, MD;  Location: [facility];  Service: Endoscopy;  Laterality: N/A;  . Left heart catheterization with coronary/graft  angiogram  01/07/2012    Procedure: LEFT HEART CATHETERIZATION WITH Beatrix Fetters;  Surgeon: Peter M Martinique, MD;  Location: [facility] CATH LAB;  Service: Cardiovascular;;  . Percutaneous coronary stent intervention (pci-s) N/A 01/10/2012    Procedure: PERCUTANEOUS CORONARY STENT INTERVENTION (PCI-S);  Surgeon: Peter M Martinique, MD;  Location[facility] C[facility] LAB;  Service: Cardiovascular;  Laterality: N/A;   Social History:  reports that he has never smoked. He has never used smokeless tobacco. He reports that he drinks alcohol. He reports that he does not use illicit drugs. Where does patient live home. Can patient participate in ADLs? Yes.  Allergies  Allergen  Reactions  . Other     Cream cheese-makes very sick    Family History:  Family History  Problem Relation Age of Onset  . CAD        Prior to Admission medications   Medication Sig Start Date End Date Taking? Authorizing Provider  albuterol (PROVENTIL HFA;VENTOLIN HFA) 108 (90 BASE) MCG/ACT inhaler Inhale 2 puffs into the lungs every 6 (six) hours as needed for wheezing or shortness of breath.   Yes Historical Provider, MD  albuterol (PROVENTIL) (2.5 MG/3ML) 0.083% nebulizer solution Take 3 mLs (2.5 mg total) by nebulization every 6 (six) hours as needed for wheezing. 07/05/13  Yes Bonnielee Haff, MD  ARTIFICIAL TEAR OP Place 2 drops into both eyes daily as needed. For dry eyes   Yes Historical Provider, MD  aspirin 81 MG tablet Take 1 tablet (81 mg total) by mouth daily. 08/10/13  Yes Dayna N Dunn, PA-C  atorvastatin (LIPITOR) 80 MG tablet Take 1 tablet (80 mg total) by mouth daily at 6 PM. 05/06/14  Yes Thayer Headings, MD  BYSTOLIC 10 MG tablet TAKE ONE TABLET BY MOUTH ONCE DAILY 05/01/14  Yes Thayer Headings, MD  clopidogrel (PLAVIX) 75 MG tablet Take 1 tablet (75 mg total) by mouth daily with breakfast. 09/17/13  Yes Liliane Shi, PA-C  dextromethorphan-guaiFENesin (MUCINEX DM) 30-600 MG per 12 hr tablet Take 1 tablet by mouth 2 (two) times daily as needed for cough.   Yes Historical Provider, MD  FERREX 150 150 MG capsule TAKE ONE CAPSULE BY MOUTH TWICE DAILY 06/10/14  Yes Thayer Headings, MD  fluticasone (FLOVENT HFA) 110 MCG/ACT inhaler Inhale 2 puffs into the lungs 2 (two) times daily. 07/05/13  Yes Bonnielee Haff, MD  furosemide (LASIX) 40 MG tablet TAKE ONE AND ONE-HALF TABLETS BY MOUTH TWICE DAILY Patient taking differently: TAKE ONE TABLET TWICE DAILY. 05/01/14  Yes Thayer Headings, MD  hydrALAZINE (APRESOLINE) 25 MG tablet Take 1 tablet (25 mg total) by mouth 3 (three) times daily. 09/17/13  Yes Scott Joylene Draft, PA-C  isosorbide mononitrate (IMDUR) 30 MG 24 hr tablet Take 1 tablet  (30 mg total) by mouth daily. 09/17/13  Yes Scott Joylene Draft, PA-C  levofloxacin (LEVAQUIN) 500 MG tablet Take 1 tablet (500 mg total) by mouth daily. 09/27/14 10/07/14 Yes Tammy S Parrett, NP  metolazone (ZAROXOLYN) 2.5 MG tablet Take 2.5 mg by mouth. ON Monday, Wednesday and Friday mornings.   Yes Historical Provider, MD  montelukast (SINGULAIR) 10 MG tablet Take 10 mg by mouth at bedtime.   Yes Historical Provider, MD  Multiple Vitamins-Minerals (MULTIVITAMIN & MINERAL PO) Take 1 tablet by mouth daily.     Yes Historical Provider, MD  nitroGLYCERIN (NITROSTAT) 0.4 MG SL tablet Place 1 tablet (0.4 mg total) under the tongue every 5 (five) minutes as needed for chest pain. 08/30/13  Yes Thayer Headings, MD  pantoprazole (PROTONIX) 40 MG tablet TAKE TWO TABLETS BY MOUTH TWICE DAILY   Yes Thayer Headings, MD  potassium chloride SA (K-DUR,KLOR-CON) 20 MEQ tablet Take 1 tablet (20 mEq total) by mouth 2 (two) times daily. 06/10/14  Yes Thayer Headings, MD  tiotropium (SPIRIVA) 18 MCG inhalation capsule Place 1 capsule (18 mcg total) into inhaler and inhale daily. 07/05/13  Yes Bonnielee Haff, MD  azithromycin (ZITHROMAX Z-PAK) 250 MG tablet Take as directed Patient not taking: Reported on 10/04/2014 09/19/14   Tammy S Parrett, NP  Meclizine HCl (BONINE) 25 MG CHEW Chew 1 tablet (25 mg total) by mouth 2 (two) times daily as needed (for vertigo). Patient not taking: Reported on 10/04/2014 08/10/13   Dayna N Dunn, PA-C  predniSONE (DELTASONE) 10 MG tablet 4 tabs for 2 days, then 3 tabs for 2 days, 2 tabs for 2 days, then 1 tab for 2 days, then stop Patient not taking: Reported on 10/04/2014 09/27/14   Melvenia Needles, NP    Physical Exam: Filed Vitals:   10/04/14 0230 10/04/14 0330 10/04/14 0408 10/04/14 0413  BP: 107/64 103/63  126/74  Pulse: 75 67  77  Temp:    97.7 F (36.5 C)  TempSrc:    Oral  Resp: 15 17  20   Height:   5\' 11"  (1.803 m)   Weight:   125.601 kg (276 lb 14.4 oz)   SpO2: 95% 94%  94%      General:  Well-developed and nourished. Obese.  Eyes: Anicteric no pallor.  ENT: No discharge from the ears eyes nose or mouth.  Neck: JVD elevated. No mass felt.  Cardiovascular: S1-S2 heard.  Respiratory: Bilateral expiratory wheeze heard no crepitations.  Abdomen: Soft nontender bowel sounds present.  Skin: No rash.  Musculoskeletal: No edema.  Psychiatric: Appears normal.  Neurologic: Alert awake oriented to time place and person. Moves all extremities.  Labs on Admission:  Basic Metabolic Panel:  Recent Labs Lab 10/04/14 0040  NA 142  K 4.5  CL 101  CO2 30  GLUCOSE 123*  BUN 49*  CREATININE 2.43*  CALCIUM 9.2   Liver Function Tests: No results for input(s): AST, ALT, ALKPHOS, BILITOT, PROT, ALBUMIN in the last 168 hours. No results for input(s): LIPASE, AMYLASE in the last 168 hours. No results for input(s): AMMONIA in the last 168 hours. CBC:  Recent Labs Lab 10/04/14 0040  WBC 7.4  NEUTROABS 4.0  HGB 15.0  HCT 46.2  MCV 93.9  PLT 189   Cardiac Enzymes: No results for input(s): CKTOTAL, CKMB, CKMBINDEX, TROPONINI in the last 168 hours.  BNP (last 3 results)  Recent Labs  10/04/14 0040  BNP 138.5*    ProBNP (last 3 results) No results for input(s): PROBNP in the last 8760 hours.  CBG:  Recent Labs Lab 10/04/14 0418  GLUCAP 113*    Radiological Exams on Admission: Dg Chest Port 1 View  10/04/2014   CLINICAL DATA:  Chronic shortness of breath and productive cough. Lower extremity swelling. Initial encounter.  EXAM: PORTABLE CHEST - 1 VIEW  COMPARISON:  Chest radiograph performed 09/27/2014  FINDINGS: The lungs are well-aerated. Mild vascular congestion is noted. Mild left basilar opacity may reflect atelectasis or possibly minimal interstitial edema. A small left pleural effusion is seen. There is no evidence of pneumothorax.  The cardiomediastinal silhouette is mildly enlarged. The patient is status post median sternotomy, with  evidence of prior CABG. No acute osseous abnormalities  are seen.  IMPRESSION: Mild vascular congestion and mild cardiomegaly. Left basilar airspace opacity may reflect atelectasis or possibly minimal interstitial edema, given clinical concern. Small left pleural effusion seen.   Electronically Signed   By: Garald Balding M.D.   On: 10/04/2014 02:01   [place] Wo Cm  10/02/2014   CLINICAL DATA:  COPD without exacerbation.  Persistent cough.  EXAM: CT PARANASAL SINUS LIMITED WITHOUT CONTRAST  TECHNIQUE: Non-contiguous multidetector CT images of the paranasal sinuses were obtained in a single plane without contrast.  COMPARISON:  None.  FINDINGS: The patient is status post functional endoscopic sinus surgery with right middle and inferior turbinectomy and medial maxillary antrectomy on the right. There is evidence of chronic sinusitis involving the right maxilla, with circumferential bony sclerosis. The opened right maxillary antrum is completely opacified, with lobulated tissue herniating into the right nasal cavity. There is extension of tissue towards the choana. When accounting for surgical change there is no visible bony destruction. No orbital or periantral soft tissue.  Opacified right sphenoid sinus without expansion or mural thickening.  IMPRESSION: 1. A polypoid mass fills the surgically opened right maxillary antrum. Given chronic sinusitis and surgical change focally present in the right maxillary sinus, suspect a recurrent lesion. Please correlate with operative/pathology history. The tissue is readily visible through the right nostril. 2. Chronic appearing right sphenoid sinusitis.   Electronically Signed   By: Monte Fantasia M.D.   On: 10/02/2014 12:43    EKG: Independently reviewed. Atrial fibrillation controlled rate.  Assessment/Plan Principal Problem:   Chest pain Active Problems:   CKD (chronic kidney disease), stage III   New onset atrial fibrillation   COPD  exacerbation   COLD (chronic obstructive lung disease)   1. Chest pain - patient had a brief episode of chest pain which lasted for 15 minutes and got improved with nitroglycerin sublingual. Given the history of CAD at this time we will cycle cardiac markers and I have consulted cardiology. Continue antiplatelet agents Lipitor and Bystolic. 2. COPD exacerbation - patient has been placed on IV steroids nebulizer Pulmicort and doxycycline. 3. New-onset atrial fibrillation - presently rate controlled. Chads 2 VASC score is 4. Since patient has had previous GI bleed I have not started patient on anticoagulants. We will defer that to cardiology. Patient is on aspirin and Plavix. Check TSH. 4. Diastolic CHF - last EF measured was 50-55% in January 2015. Patient is on Lasix and metolazone. 5. Acute on chronic renal failure stage III - closely follow intake output and metabolic panel. May have to decrease diuretics if creatinine worsens.   DVT Prophylaxis Lovenox.  Code Status: Full code.  Family Communication: Patient's son at the bedside.  Disposition Plan: Admit to inpatient.    Damyen Knoll N. [facility] Pager [phone].  If 7PM-7AM, please contact night-coverage [email or web address] Password [facility] 10/04/2014, 5:52 AM

## 2014-10-04 NOTE — ED Provider Notes (Signed)
CSN: [id number]     Arrival date & time 10/04/14  0017 History  This chart was scribed for Fred Fuel, MD by Rayfield Citizen, ED Scribe. This patient was seen in room A10C/A10C and the patient's care was started at 1:12 AM.    Chief Complaint  Patient presents with  . Chest Pain  . Shortness of Breath   Patient is a 81 y.o. male presenting with chest pain and shortness of breath. The history is provided by the patient. No language interpreter was used.  Chest Pain Associated symptoms: cough, diaphoresis, nausea and shortness of breath   Associated symptoms: no fever and not vomiting   Shortness of Breath Associated symptoms: chest pain, cough and diaphoresis   Associated symptoms: no fever and no vomiting      HPI Comments: QUOC TOME is a 81 y.o. male with past medical history of  HTN, HLD, syncope, CAD, CHF, COPD, asthma, orthopnea, CKD who presents to the Emergency Department complaining of 6 weeks of gradually worsening SOB and productive cough (white sputum, no blood). He also notes two weeks of intermittent chest pain, episodes lasting 15-20 minutes at a time and exacerbated with activity; this pain is improved with NTG (last dose: 2 NTG tabs just PTA with relief of pain). He states that he has noticed a change in his heart rhythm, described as his heart rate "getting shorter and sometimes skipping beats." He reports occasional nausea and diaphoresis. He denies known history of a-fib, fever, chills, vomiting.   He states he has been compliant with his daily aspirin and Plavix. He is on 3L home O2 at all times.   Past Medical History  Diagnosis Date  . CAD (coronary artery disease)     a. s/p CABG x 3 in 1995; b.  NSTEMI 12/2011 -> Cath 6/21: LM 20-30, LAD 90, RI 95, LCX 54m, RCA 90/167m (treated w/ 2.0x16 Promus DES), VG->RCA ok, VG->RI 70-86m (treated w/ 4.0x28 Promus), LIMA->LAD ok, EF 55-60%.  Marland Kitchen HTN (hypertension)   . HLD (hyperlipidemia)   . Morbid obesity   . Syncope   .  COPD (chronic obstructive pulmonary disease)     a. on home O2  . HOH (hard of hearing)     left ear  . Asthma   . Sleep apnea   . Chronic diastolic CHF (congestive heart failure)     a. Echo 10/2011: EF 55-60%;  b. elevated EDP req diuresis.  . Chronic respiratory failure 07/11/2013    a. 3L home O2 24/7.  Marland Kitchen Orthopnea     a. Has been sleeping in a recliner x 30 yrs.  . Hypothyroidism   . GERD (gastroesophageal reflux disease)   . Arthritis     "knees; hands" (07/11/2013)  . GI bleed     a. 06/2013 adm - prepyloric ulcer, path neg for malignancy. A/w ABL anemia.  . Acute blood loss anemia     a. 06/2013 due to GIB.  . CKD (chronic kidney disease)     a. ?Based on Cr 2013 - 1.3-1.7.   Past Surgical History  Procedure Laterality Date  . Functional endoscopic sinus surgery  2006  . Hydrocele excision  2005  . Prostate surgery  1998  . Excisional hemorrhoidectomy      "twice cut them out; burnt them out once"  . Coronary angioplasty with stent placement  12/2011  . Coronary artery bypass graft  1995    CABG X3  . Inguinal hernia repair Right 2006  .  Knee arthroscopy Right 1990's  . Back surgery  1952    "when I was in the [place]" (07/11/2013)  . Esophagogastroduodenoscopy N/A 07/12/2013    Procedure: ESOPHAGOGASTRODUODENOSCOPY (EGD);  Surgeon: Inda Castle, MD;  Location: [facility];  Service: Endoscopy;  Laterality: N/A;  . Left heart catheterization with coronary/graft angiogram  01/07/2012    Procedure: LEFT HEART CATHETERIZATION WITH Beatrix Fetters;  Surgeon: Peter M Martinique, MD;  Location: [facility] CATH LAB;  Service: Cardiovascular;;  . Percutaneous coronary stent intervention (pci-s) N/A 01/10/2012    Procedure: PERCUTANEOUS CORONARY STENT INTERVENTION (PCI-S);  Surgeon: Peter M Martinique, MD;  Location: [facility] CATH LAB;  Service: Cardiovascular;  Laterality: N/A;   Family History  Problem Relation Age of Onset  . CAD     History  Substance Use Topics  . Smoking  status: Never Smoker   . Smokeless tobacco: Never Used  . Alcohol Use: Yes     Comment: prior alcohol, no sig use now    Review of Systems  Constitutional: Positive for diaphoresis. Negative for fever and chills.  Respiratory: Positive for cough and shortness of breath.   Cardiovascular: Positive for chest pain.  Gastrointestinal: Positive for nausea. Negative for vomiting.  All other systems reviewed and are negative.   Allergies  Other  Home Medications   Prior to Admission medications   Medication Sig Start Date End Date Taking? Authorizing Provider  albuterol (PROVENTIL HFA;VENTOLIN HFA) 108 (90 BASE) MCG/ACT inhaler Inhale 2 puffs into the lungs every 6 (six) hours as needed for wheezing or shortness of breath.    Historical Provider, MD  albuterol (PROVENTIL) (2.5 MG/3ML) 0.083% nebulizer solution Take 3 mLs (2.5 mg total) by nebulization every 6 (six) hours as needed for wheezing. 07/05/13   Bonnielee Haff, MD  ARTIFICIAL TEAR OP Place 2 drops into both eyes daily as needed. For dry eyes    Historical Provider, MD  aspirin 81 MG tablet Take 1 tablet (81 mg total) by mouth daily. 08/10/13   Dayna N Dunn, PA-C  atorvastatin (LIPITOR) 80 MG tablet Take 1 tablet (80 mg total) by mouth daily at 6 PM. 05/06/14   Thayer Headings, MD  azithromycin (ZITHROMAX Z-PAK) 250 MG tablet Take as directed 09/19/14   Melvenia Needles, NP  BYSTOLIC 10 MG tablet TAKE ONE TABLET BY MOUTH ONCE DAILY 05/01/14   Thayer Headings, MD  clopidogrel (PLAVIX) 75 MG tablet Take 1 tablet (75 mg total) by mouth daily with breakfast. 09/17/13   Liliane Shi, PA-C  FERREX 150 150 MG capsule TAKE ONE CAPSULE BY MOUTH TWICE DAILY 06/10/14   Thayer Headings, MD  fluticasone (FLOVENT HFA) 110 MCG/ACT inhaler Inhale 2 puffs into the lungs 2 (two) times daily. 07/05/13   Bonnielee Haff, MD  furosemide (LASIX) 40 MG tablet TAKE ONE AND ONE-HALF TABLETS BY MOUTH TWICE DAILY 05/01/14   Thayer Headings, MD  hydrALAZINE  (APRESOLINE) 25 MG tablet Take 1 tablet (25 mg total) by mouth 3 (three) times daily. 09/17/13   Liliane Shi, PA-C  isosorbide mononitrate (IMDUR) 30 MG 24 hr tablet Take 1 tablet (30 mg total) by mouth daily. 09/17/13   Liliane Shi, PA-C  levofloxacin (LEVAQUIN) 500 MG tablet Take 1 tablet (500 mg total) by mouth daily. 09/27/14 10/07/14  Tammy S Parrett, NP  Meclizine HCl (BONINE) 25 MG CHEW Chew 1 tablet (25 mg total) by mouth 2 (two) times daily as needed (for vertigo). 08/10/13   Charlie Pitter,  PA-C  metolazone (ZAROXOLYN) 2.5 MG tablet Take 2.5 mg by mouth. ON Monday, Wednesday and Friday mornings.    Historical Provider, MD  montelukast (SINGULAIR) 10 MG tablet Take 10 mg by mouth at bedtime.    Historical Provider, MD  Multiple Vitamins-Minerals (MULTIVITAMIN & MINERAL PO) Take 1 tablet by mouth daily.      Historical Provider, MD  nitroGLYCERIN (NITROSTAT) 0.4 MG SL tablet Place 1 tablet (0.4 mg total) under the tongue every 5 (five) minutes as needed for chest pain. 08/30/13   Thayer Headings, MD  pantoprazole (PROTONIX) 40 MG tablet TAKE TWO TABLETS BY MOUTH TWICE DAILY    Thayer Headings, MD  potassium chloride SA (K-DUR,KLOR-CON) 20 MEQ tablet Take 1 tablet (20 mEq total) by mouth 2 (two) times daily. 06/10/14   Thayer Headings, MD  predniSONE (DELTASONE) 10 MG tablet 4 tabs for 2 days, then 3 tabs for 2 days, 2 tabs for 2 days, then 1 tab for 2 days, then stop 09/27/14   Tammy S Parrett, NP  tiotropium (SPIRIVA) 18 MCG inhalation capsule Place 1 capsule (18 mcg total) into inhaler and inhale daily. 07/05/13   Bonnielee Haff, MD   BP 123/72 mmHg  Pulse 70  Resp 24  SpO2 94% Physical Exam  Constitutional: He is oriented to person, place, and time. He appears well-developed and well-nourished. No distress.  HENT:  Head: Normocephalic and atraumatic.  Mouth/Throat: Oropharynx is clear and moist. No oropharyngeal exudate.  Eyes: EOM are normal. Pupils are equal, round, and reactive to  light.  Neck: Normal range of motion. Neck supple.  Cardiovascular: Normal rate and normal heart sounds.  Exam reveals no gallop and no friction rub.   No murmur heard. Irregular rhythm  Pulmonary/Chest: Effort normal. No respiratory distress (Scattered ). He has wheezes. He has no rales.  Abdominal: Soft. There is no tenderness. There is no rebound and no guarding.  Musculoskeletal: Normal range of motion. He exhibits edema (1+ pitting edema).  Neurological: He is alert and oriented to person, place, and time.  Skin: Skin is warm and dry. No rash noted.  Psychiatric: He has a normal mood and affect. His behavior is normal.  Nursing note and vitals reviewed.   ED Course  Procedures   DIAGNOSTIC STUDIES: Oxygen Saturation is 90% on [place] 2L/min, low by my interpretation.    COORDINATION OF CARE: 1:21 AM Discussed treatment plan with pt at bedside and pt agreed to plan.   Labs Review Labs Reviewed  CBC WITH DIFFERENTIAL/PLATELET - Abnormal; Notable for the following:    Monocytes Relative 14 (*)    All other components within normal limits  BASIC METABOLIC PANEL  BRAIN NATRIURETIC PEPTIDE  Randolm Idol, ED    Imaging Review [place] [facility]  10/02/2014   CLINICAL DATA:  COPD without exacerbation.  Persistent cough.  EXAM: CT PARANASAL SINUS LIMITED WITHOUT CONTRAST  TECHNIQUE: Non-contiguous multidetector CT images of the paranasal sinuses were obtained in a single plane without contrast.  COMPARISON:  None.  FINDINGS: The patient is status post functional endoscopic sinus surgery with right middle and inferior turbinectomy and medial maxillary antrectomy on the right. There is evidence of chronic sinusitis involving the right maxilla, with circumferential bony sclerosis. The opened right maxillary antrum is completely opacified, with lobulated tissue herniating into the right nasal cavity. There is extension of tissue towards the choana. When accounting for surgical  change there is no visible bony destruction. No orbital or periantral  soft tissue.  Opacified right sphenoid sinus without expansion or mural thickening.  IMPRESSION: 1. A polypoid mass fills the surgically opened right maxillary antrum. Given chronic sinusitis and surgical change focally present in the right maxillary sinus, suspect a recurrent lesion. Please correlate with operative/pathology history. The tissue is readily visible through the right nostril. 2. Chronic appearing right sphenoid sinusitis.   Electronically Signed   By: Monte Fantasia M.D.   On: 10/02/2014 12:43   ECG demonstrates atrial fibrillation with a rate of 74 and borderline left axis deviation. Old inferior wall MI and old anterior wall MI are noted. Intervals are normal and ST and T waves are normal. When compared with ECG of 09/17/2013, atrial fibrillation has replaced normal sinus rhythm. Review of prior ECGs show no ECGs with rhythm of atrial fibrillation except for today.  MDM   Final diagnoses:  Acute on chronic diastolic CHF (congestive heart failure)  Chronic bronchitis, unspecified chronic bronchitis type  Atrial fibrillation, unspecified    Dyspnea with findings suggestive of both COPD and CHF. He is given albuterol with ipratropium with partial relief of symptoms and is also given furosemide. He had recurrence of chest pain which was treated with nitroglycerin. Heart rhythm change is noted and this may account for his symptoms. He was not aware of the change in heart rhythm. Case is discussed with Dr. Renetta Chalk of [facility] who agrees to admit the patient.  I personally performed the services described in this documentation, which was scribed in my presence. The recorded information has been reviewed and is accurate.       Fred Fuel, MD 37/85/88 [id number]

## 2014-10-04 NOTE — Consult Note (Signed)
CARDIOLOGY CONSULT NOTE  Patient ID: [name] MRN: [id number] DOB/AGE: 19-Jan-1931 81 y.o.  Admit date: 10/04/2014 Primary Physician  Melinda Crutch, MD Primary Cardiologist  Dr. Acie Fredrickson Chief Complaint  Chest pain  HPI:  The patient presented with chest pain.  He was found to have atrial fib which is new onset.  He has a history of CAD but no prior history of atrial fib.  Complicating this is a recent admission in December with melena and anemia.  He had an ulcer found on EGD. He was last admitted on 1/20 with acute on chronic diastolic HF.  EF was 55% on echo.    The patient reports . He has chronic dyspnea and it has been worse for 6 weeks. However, last night he developed chest discomfort. This is in his left chest. It is somewhat sharp. He has some discomfort in his left hand and it feels like it's gone to sleep. He took a nitroglycerin. Several hours later took another nitroglycerin. He presented to the emergency room where he had a third. He was noted to be in atrial fibrillation. However, there were no acute ST segment changes reported although I don't see an EKG and apparently this was only on telemetry. He says he has chronic cough and it hurts worse to cough. His weight is down. He's not had any swelling. His cough is productive only of some white sputum. He's had no fevers or chills. He is very limited by his chronic O2 dependent lung disease.  Past Medical History  Diagnosis Date  . CAD (coronary artery disease)     a. s/p CABG x 3 in 1995; b.  NSTEMI 12/2011 -> Cath 6/21: LM 20-30, LAD 90, RI 95, LCX 45m, RCA 90/142m (treated w/ 2.0x16 Promus DES), VG->RCA ok, VG->RI 70-70m (treated w/ 4.0x28 Promus), LIMA->LAD ok, EF 55-60%.  Marland Kitchen HTN (hypertension)   . HLD (hyperlipidemia)   . Morbid obesity   . Syncope   . COPD (chronic obstructive pulmonary disease)     a. on home O2  . HOH (hard of hearing)     left ear  . Asthma   . Sleep apnea   . Chronic diastolic CHF (congestive  heart failure)     a. Echo 10/2011: EF 55-60%;  b. elevated EDP req diuresis.  . Chronic respiratory failure 07/11/2013    a. 3L home O2 24/7.  Marland Kitchen Orthopnea     a. Has been sleeping in a recliner x 30 yrs.  . Hypothyroidism   . GERD (gastroesophageal reflux disease)   . Arthritis     "knees; hands" (07/11/2013)  . GI bleed     a. 06/2013 adm - prepyloric ulcer, path neg for malignancy. A/w ABL anemia.  . Acute blood loss anemia     a. 06/2013 due to GIB.  . CKD (chronic kidney disease)     a. ?Based on Cr 2013 - 1.3-1.7.    Past Surgical History  Procedure Laterality Date  . Functional endoscopic sinus surgery  2006  . Hydrocele excision  2005  . Prostate surgery  1998  . Excisional hemorrhoidectomy      "twice cut them out; burnt them out once"  . Coronary angioplasty with stent placement  12/2011  . Coronary artery bypass graft  1995    CABG X3  . Inguinal hernia repair Right 2006  . Knee arthroscopy Right 1990's  . Back surgery  1952    "when I was in the [place]" (07/11/2013)  .  Esophagogastroduodenoscopy N/A 07/12/2013    Procedure: ESOPHAGOGASTRODUODENOSCOPY (EGD);  Surgeon: Inda Castle, MD;  Location: [facility];  Service: Endoscopy;  Laterality: N/A;  . Left heart catheterization with coronary/graft angiogram  01/07/2012    Procedure: LEFT HEART CATHETERIZATION WITH Beatrix Fetters;  Surgeon: Peter M Martinique, MD;  Location: [facility] CATH LAB;  Service: Cardiovascular;;  . Percutaneous coronary stent intervention (pci-s) N/A 01/10/2012    Procedure: PERCUTANEOUS CORONARY STENT INTERVENTION (PCI-S);  Surgeon: Peter M Martinique, MD;  Location: [facility] CATH LAB;  Service: Cardiovascular;  Laterality: N/A;    Allergies  Allergen Reactions  . Other     Cream cheese-makes very sick   Prescriptions prior to admission  Medication Sig Dispense Refill Last Dose  . albuterol (PROVENTIL HFA;VENTOLIN HFA) 108 (90 BASE) MCG/ACT inhaler Inhale 2 puffs into the lungs every 6 (six)  hours as needed for wheezing or shortness of breath.   10/03/2014 at Unknown time  . albuterol (PROVENTIL) (2.5 MG/3ML) 0.083% nebulizer solution Take 3 mLs (2.5 mg total) by nebulization every 6 (six) hours as needed for wheezing. 75 mL 12 10/03/2014 at Unknown time  . ARTIFICIAL TEAR OP Place 2 drops into both eyes daily as needed. For dry eyes   unk  . aspirin 81 MG tablet Take 1 tablet (81 mg total) by mouth daily.   10/03/2014 at Unknown time  . atorvastatin (LIPITOR) 80 MG tablet Take 1 tablet (80 mg total) by mouth daily at 6 PM. 91 tablet 3 10/03/2014 at Unknown time  . BYSTOLIC 10 MG tablet TAKE ONE TABLET BY MOUTH ONCE DAILY 30 tablet 6 10/03/2014 at 0700  . clopidogrel (PLAVIX) 75 MG tablet Take 1 tablet (75 mg total) by mouth daily with breakfast. 30 tablet 5 10/03/2014 at Unknown time  . dextromethorphan-guaiFENesin (MUCINEX DM) 30-600 MG per 12 hr tablet Take 1 tablet by mouth 2 (two) times daily as needed for cough.   Past Week at Unknown time  . FERREX 150 150 MG capsule TAKE ONE CAPSULE BY MOUTH TWICE DAILY 60 capsule 6 10/03/2014 at Unknown time  . fluticasone (FLOVENT HFA) 110 MCG/ACT inhaler Inhale 2 puffs into the lungs 2 (two) times daily. 1 Inhaler 12 10/03/2014 at Unknown time  . furosemide (LASIX) 40 MG tablet TAKE ONE AND ONE-HALF TABLETS BY MOUTH TWICE DAILY (Patient taking differently: TAKE ONE TABLET TWICE DAILY.) 90 tablet 6 10/03/2014 at Unknown time  . hydrALAZINE (APRESOLINE) 25 MG tablet Take 1 tablet (25 mg total) by mouth 3 (three) times daily. 90 tablet 5 10/03/2014 at Unknown time  . isosorbide mononitrate (IMDUR) 30 MG 24 hr tablet Take 1 tablet (30 mg total) by mouth daily. 30 tablet 5 10/03/2014 at Unknown time  . levofloxacin (LEVAQUIN) 500 MG tablet Take 1 tablet (500 mg total) by mouth daily. 7 tablet 0 10/03/2014 at Unknown time  . metolazone (ZAROXOLYN) 2.5 MG tablet Take 2.5 mg by mouth. ON Monday, Wednesday and Friday mornings.   10/02/2014  . montelukast (SINGULAIR)  10 MG tablet Take 10 mg by mouth at bedtime.   10/03/2014 at Unknown time  . Multiple Vitamins-Minerals (MULTIVITAMIN & MINERAL PO) Take 1 tablet by mouth daily.     Past Month at Unknown time  . nitroGLYCERIN (NITROSTAT) 0.4 MG SL tablet Place 1 tablet (0.4 mg total) under the tongue every 5 (five) minutes as needed for chest pain. 25 tablet 3 10/04/2014 at Unknown time  . pantoprazole (PROTONIX) 40 MG tablet TAKE TWO TABLETS BY MOUTH TWICE DAILY 120  tablet 0 10/03/2014 at Unknown time  . potassium chloride SA (K-DUR,KLOR-CON) 20 MEQ tablet Take 1 tablet (20 mEq total) by mouth 2 (two) times daily. 60 tablet 5 10/03/2014 at Unknown time  . tiotropium (SPIRIVA) 18 MCG inhalation capsule Place 1 capsule (18 mcg total) into inhaler and inhale daily. 30 capsule 12 10/03/2014 at Unknown time  . azithromycin (ZITHROMAX Z-PAK) 250 MG tablet Take as directed (Patient not taking: Reported on 10/04/2014) 6 each 0 Taking  . Meclizine HCl (BONINE) 25 MG CHEW Chew 1 tablet (25 mg total) by mouth 2 (two) times daily as needed (for vertigo). (Patient not taking: Reported on 10/04/2014)   Taking  . predniSONE (DELTASONE) 10 MG tablet 4 tabs for 2 days, then 3 tabs for 2 days, 2 tabs for 2 days, then 1 tab for 2 days, then stop (Patient not taking: Reported on 10/04/2014) 20 tablet 0    Family History  Problem Relation Age of Onset  . CAD    Extensive family history of 79 brothers and sisters with heart disease.    History   Social History  . Marital Status: Married    Spouse Name: N/A  . Number of Children: N/A  . Years of Education: N/A   Occupational History  . Retired     Patent examiner   Social History Main Topics  . Smoking status: Never Smoker   . Smokeless tobacco: Never Used  . Alcohol Use: Yes     Comment: prior alcohol, no sig use now  . Drug Use: No  . Sexual Activity: No   Other Topics Concern  . Not on file   Social History Narrative     ROS:    As stated in the HPI  and negative for all other systems.  Physical Exam: Blood pressure 126/74, pulse 77, temperature 97.7 F (36.5 C), temperature source Oral, resp. rate 20, height 5\' 11"  (1.803 m), weight 276 lb 14.4 oz (125.601 kg), SpO2 95 %.  GENERAL:   Chronically ill appearing HEENT:  Pupils equal round and reactive, fundi not visualized, oral mucosa unremarkable NECK:  No jugular venous distention, waveform within normal limits, carotid upstroke brisk and symmetric, no bruits, no thyromegaly LYMPHATICS:  No cervical, inguinal adenopathy LUNGS:  Decreased breath sounds with coarse crackles and rhonchi BACK:  No CVA tenderness CHEST:  Unremarkable HEART:  PMI not displaced or sustained,S1 and S2 within normal limits, no S3, no no clicks, no rubs, no murmurs, irregular ABD:  Flat, positive bowel sounds normal in frequency in pitch, no bruits, no rebound, no guarding, no midline pulsatile mass, no hepatomegaly, no splenomegaly EXT:  2 plus pulses throughout, no edema, no cyanosis no clubbing SKIN:  No rashes no nodules NEURO:  Cranial nerves II through XII grossly intact, motor grossly intact throughout PSYCH:  Cognitively intact, oriented to person place and time  Labs: Lab Results  Component Value Date   BUN 49* 10/04/2014   Lab Results  Component Value Date   CREATININE 2.43* 10/04/2014   Lab Results  Component Value Date   NA 142 10/04/2014   K 4.5 10/04/2014   CL 101 10/04/2014   CO2 30 10/04/2014   Lab Results  Component Value Date   TROPONINI <0.30 08/08/2013   Lab Results  Component Value Date   WBC 7.8 10/04/2014   HGB 15.7 10/04/2014   HCT 47.7 10/04/2014   MCV 93.5 10/04/2014   PLT 191 10/04/2014   No results found for: CHOL, HDL,  LDLCALC, LDLDIRECT, TRIG, CHOLHDL Lab Results  Component Value Date   ALT 17 08/07/2013   AST 19 08/07/2013   ALKPHOS 79 08/07/2013   BILITOT 0.3 08/07/2013     Radiology:   CXR:  Mild vascular congestion and mild cardiomegaly. Left  basilar airspace opacity may reflect atelectasis or possibly minimal interstitial edema, given clinical concern. Small left pleural effusion seen.  EKG atrial fibrillation, rate 79, axis within normal limits, low voltage in the limb leads, poor anterior R wave progression, no acute ST-T wave changes.  10/04/2014   ASSESSMENT AND PLAN:   CHEST PAIN:   I do not suspect an acute coronary syndrome.  His first troponin was negative.  I would have a very high threshold for invasive testing and I would simply continue to cycle enzymes.   ATRIAL FIB:   Given his previous GI bleed and nose bleeds which were apparently frequent I think he would be a poor candidate for warfarin for NOAC.   His rate seems to be reasonably controlled. We can follow this on telemetry and then at home with a Holter.   DYSPNEA:  There might be some element of volume but I suspect this is primarily acute on chronic lung disease. Given his increased creatinine and low BNP I would not suggest more aggressive diuresis. I will switch him back to oral Lasix.   CHRONIC DIASTOLIC HF:   His BNP was only 138.  He does not seem to be volume overloaded.      CKD:  His creat is up to 2.43 from his previous of 1.7.    As above. Follow per primary team.    Signed: Minus Breeding 10/04/2014, 7:53 AM

## 2014-10-04 NOTE — ED Notes (Signed)
Patient here with complaint of central chest pain accompanied by shortness of breath with onset about 3 days ago. Patient states pain has been intermittent for a "while" now. Took 2 nitro tabs prior to arrival with relief of pain. Reports pain feels similar to previous MI. EKG shows a-fib, patient is unsure if he has ever been in a-fib.

## 2014-10-05 ENCOUNTER — Inpatient Hospital Stay (HOSPITAL_COMMUNITY): Payer: Medicare Other

## 2014-10-05 DIAGNOSIS — N179 Acute kidney failure, unspecified: Secondary | ICD-10-CM | POA: Diagnosis present

## 2014-10-05 DIAGNOSIS — I4891 Unspecified atrial fibrillation: Secondary | ICD-10-CM

## 2014-10-05 DIAGNOSIS — I5033 Acute on chronic diastolic (congestive) heart failure: Secondary | ICD-10-CM

## 2014-10-05 DIAGNOSIS — R079 Chest pain, unspecified: Secondary | ICD-10-CM

## 2014-10-05 LAB — BASIC METABOLIC PANEL
ANION GAP: 11 (ref 5–15)
BUN: 53 mg/dL — AB (ref 6–23)
CHLORIDE: 95 mmol/L — AB (ref 96–112)
CO2: 33 mmol/L — ABNORMAL HIGH (ref 19–32)
Calcium: 8.8 mg/dL (ref 8.4–10.5)
Creatinine, Ser: 2.56 mg/dL — ABNORMAL HIGH (ref 0.50–1.35)
GFR, EST AFRICAN AMERICAN: 25 mL/min — AB (ref >90)
GFR, EST NON AFRICAN AMERICAN: 22 mL/min — AB (ref >90)
Glucose, Bld: 129 mg/dL — ABNORMAL HIGH (ref 70–99)
POTASSIUM: 3.8 mmol/L (ref 3.5–5.1)
SODIUM: 139 mmol/L (ref 135–145)

## 2014-10-05 LAB — URINALYSIS, ROUTINE W REFLEX MICROSCOPIC
Bilirubin Urine: NEGATIVE
Glucose, UA: NEGATIVE mg/dL
Hgb urine dipstick: NEGATIVE
Ketones, ur: NEGATIVE mg/dL
Leukocytes, UA: NEGATIVE
Nitrite: NEGATIVE
PH: 5.5 (ref 5.0–8.0)
Protein, ur: NEGATIVE mg/dL
SPECIFIC GRAVITY, URINE: 1.014 (ref 1.005–1.030)
UROBILINOGEN UA: 0.2 mg/dL (ref 0.0–1.0)

## 2014-10-05 LAB — CBC
HEMATOCRIT: 45.4 % (ref 39.0–52.0)
HEMOGLOBIN: 14.8 g/dL (ref 13.0–17.0)
MCH: 30.6 pg (ref 26.0–34.0)
MCHC: 32.6 g/dL (ref 30.0–36.0)
MCV: 94 fL (ref 78.0–100.0)
Platelets: 183 10*3/uL (ref 150–400)
RBC: 4.83 MIL/uL (ref 4.22–5.81)
RDW: 14 % (ref 11.5–15.5)
WBC: 7 10*3/uL (ref 4.0–10.5)

## 2014-10-05 MED ORDER — IPRATROPIUM-ALBUTEROL 0.5-2.5 (3) MG/3ML IN SOLN
3.0000 mL | RESPIRATORY_TRACT | Status: DC
  Administered 2014-10-05 – 2014-10-09 (×27): 3 mL via RESPIRATORY_TRACT
  Filled 2014-10-05 (×28): qty 3

## 2014-10-05 MED ORDER — METHYLPREDNISOLONE SODIUM SUCC 40 MG IJ SOLR
40.0000 mg | Freq: Two times a day (BID) | INTRAMUSCULAR | Status: DC
  Filled 2014-10-05: qty 1

## 2014-10-05 MED ORDER — BUDESONIDE-FORMOTEROL FUMARATE 160-4.5 MCG/ACT IN AERO
2.0000 | INHALATION_SPRAY | Freq: Two times a day (BID) | RESPIRATORY_TRACT | Status: DC
  Administered 2014-10-05 – 2014-10-07 (×2): 2 via RESPIRATORY_TRACT
  Filled 2014-10-05: qty 6

## 2014-10-05 MED ORDER — METHYLPREDNISOLONE SODIUM SUCC 125 MG IJ SOLR
60.0000 mg | Freq: Two times a day (BID) | INTRAMUSCULAR | Status: DC
  Administered 2014-10-05 – 2014-10-06 (×2): 60 mg via INTRAVENOUS
  Filled 2014-10-05 (×2): qty 0.96
  Filled 2014-10-05: qty 2

## 2014-10-05 NOTE — Progress Notes (Signed)
Patient ID: GERVIS GABA, male   DOB: 08-10-1930, 81 y.o.   MRN: [id number]     Subjective:    + SOB. No chest pain. + nausea  Objective:   Temp:  [97.7 F (36.5 C)-98.5 F (36.9 C)] 98 F (36.7 C) (03/19 0635) Pulse Rate:  [77-84] 77 (03/19 0635) Resp:  [19-20] 20 (03/19 0635) BP: (107-119)/(64-79) 110/67 mmHg (03/19 1003) SpO2:  [94 %-100 %] 97 % (03/19 1210) Weight:  [276 lb 10.8 oz (125.498 kg)] 276 lb 10.8 oz (125.498 kg) (03/19 0635) Last BM Date: 10/03/14  Filed Weights   10/04/14 0408 10/05/14 4097  Weight: 276 lb 14.4 oz (125.601 kg) 276 lb 10.8 oz (125.498 kg)    Intake/Output Summary (Last 24 hours) at 10/05/14 1310 Last data filed at 10/05/14 1225  Gross per 24 hour  Intake   1270 ml  Output    875 ml  Net    395 ml    Telemetry: sinus tach  Exam:  General: NAD  Resp: bilateral wheezing  Cardiac: RRR, no m/r/g, no JVD  DZ:HGDJMEQ soft, NT, ND  MSK:no LE edema  Neuro: no focal deficits  Psych: appropriate affect  Lab Results:  Basic Metabolic Panel:  Recent Labs Lab 10/04/14 0655 10/04/14 1127 10/05/14 0400  NA 141 140 139  K 5.3* 3.9 3.8  CL 99 98 95*  CO2 33* 33* 33*  GLUCOSE 106* 136* 129*  BUN 46* 45* 53*  CREATININE 2.44* 2.39* 2.56*  CALCIUM 9.1 9.1 8.8    Liver Function Tests:  Recent Labs Lab 10/04/14 0655  AST 58*  ALT 23  ALKPHOS 74  BILITOT 2.2*  PROT 6.6  ALBUMIN 3.5    CBC:  Recent Labs Lab 10/04/14 0040 10/04/14 0655 10/05/14 0400  WBC 7.4 7.8 7.0  HGB 15.0 15.7 14.8  HCT 46.2 47.7 45.4  MCV 93.9 93.5 94.0  PLT 189 191 183    Cardiac Enzymes:  Recent Labs Lab 10/04/14 0655 10/04/14 1127 10/04/14 1916  TROPONINI 0.05* 0.06* 0.06*    BNP: No results for input(s): PROBNP in the last 8760 hours.  Coagulation: No results for input(s): INR in the last 168 hours.  ECG:   Medications:   Scheduled Medications: . antiseptic oral rinse  7 mL Mouth Rinse BID  . aspirin EC  81 mg Oral  Daily  . atorvastatin  80 mg Oral q1800  . budesonide (PULMICORT) nebulizer solution  0.25 mg Nebulization BID  . clopidogrel  75 mg Oral Daily  . doxycycline (VIBRAMYCIN) IV  100 mg Intravenous BID  . enoxaparin (LOVENOX) injection  40 mg Subcutaneous Q24H  . furosemide  40 mg Oral BID  . hydrALAZINE  25 mg Oral TID  . ipratropium-albuterol  3 mL Nebulization Q4H  . iron polysaccharides  150 mg Oral BID  . isosorbide mononitrate  30 mg Oral Daily  . methylPREDNISolone (SOLU-MEDROL) injection  40 mg Intravenous Daily  . metolazone  2.5 mg Oral Q M,W,F  . montelukast  10 mg Oral QHS  . nebivolol  10 mg Oral Daily  . pantoprazole  80 mg Oral BID  . sodium chloride  3 mL Intravenous Q12H     Infusions:     PRN Medications:  acetaminophen **OR** acetaminophen, levalbuterol, nitroGLYCERIN, ondansetron **OR** ondansetron (ZOFRAN) IV     Assessment/Plan   1. Chest pain - hx of prior CABG,  prior DES in 2 013 - trivial troponin elevation 0.05 to 0.06, EKG without ischemic changes.  -  will repeat EKG, obtain echo today. Not clear this is cardiac  2. Afib - new onset, patient has hx of GI bleed and has not been started on anticoag - continue rate control and ASA  3. Chronic HF with preserved LVEF - Jan 2015 echo LVEF 50-55%, no WMAs, normal diastolic function - CXR with mild congestion, BNP 138 - does not appear volume overloaded by exam  4. COPD - exacerbation management per primary team.    5. CKD stage 3 3   Carlyle Dolly, M.D., F.A.C.C.

## 2014-10-05 NOTE — Progress Notes (Signed)
[facility] PROGRESS NOTE  [name] IOX:[id number] DOB: October 23, 1930 DOA: 10/04/2014 PCP:  Melinda Crutch, MD Interim summary: 81 year old male admitted for left sided pleuritc chest pain and sob. He was admitted for  COPD exacerbation and evaluation of ACS. His troponins came back elevated and cardiology consulted.    Assessment/Plan: 1. Pleuritic chest pain: pain worsens on deep breathing, improved today, but reports worsening sob this am. His troponins are slightly elevated but his EKG does not show any ischemic changes. Echocardiogram ordered. By cardiology.  2. Copd exacerbation: he was initially started on 40 mg of IV solumedrol daily, increased the dose to 60 mg BID . Resume bronchodilators and  Change pulmicort to symbicort.   3. Chronic atrial fibrillation: Rate controlled. Not a candidate for anticoagulation secondary to GI bleed.  Currently on aspirin and plavix.    4. Chronic diastolic heart failure: - does not appear to be fluid overloaded.  - resume same dose of lasix and metolazone.  - further management as per cardiology.    Acute on CKD: - Unclear etiology, ? Is this creatinine his new baseline. UA is negative for infection.  US renal does not show any hydronephrosis.  Urine electrolytes ordered  If his renal function does not improve , plan to call renal consult.    Hypertension: Well controlled.  Resume home meds.     Code Status: full code.  Family Communication: discussed with daughter over the phone Disposition Plan: pending PT eval inam.    Consultants:  cardiology  Procedures:  Echocardiogram.   Antibiotics:  Doxycycline.   HPI/Subjective: Feeling sob, mild coughing. Sitting int he chair, no family at bedside  Objective: Filed Vitals:   10/05/14 1453  BP: 109/68  Pulse: 91  Temp: 98.4 F (36.9 C)  Resp: 20    Intake/Output Summary (Last 24 hours) at 10/05/14 1640 Last data filed at 10/05/14 1601  Gross per 24 hour   Intake   4020 ml  Output    775 ml  Net   3245 ml   Filed Weights   10/04/14 0408 10/05/14 0635  Weight: 125.601 kg (276 lb 14.4 oz) 125.498 kg (276 lb 10.8 oz)    Exam:   General:  ALERT Afebrile in mild distress from sob  Cardiovascular: s1s2. tachycardic  Respiratory: bilateral wheezing heard, no rhonchi  Abdomen: soft non tender non distended bowel sounds heard.   Musculoskeletal: trace pedal edema.   Data Reviewed: Basic Metabolic Panel:  Recent Labs Lab 10/04/14 0040 10/04/14 0655 10/04/14 1127 10/05/14 0400  NA 142 141 140 139  K 4.5 5.3* 3.9 3.8  CL 101 99 98 95*  CO2 30 33* 33* 33*  GLUCOSE 123* 106* 136* 129*  BUN 49* 46* 45* 53*  CREATININE 2.43* 2.44* 2.39* 2.56*  CALCIUM 9.2 9.1 9.1 8.8   Liver Function Tests:  Recent Labs Lab 10/04/14 0655  AST 58*  ALT 23  ALKPHOS 74  BILITOT 2.2*  PROT 6.6  ALBUMIN 3.5   No results for input(s): LIPASE, AMYLASE in the last 168 hours. No results for input(s): AMMONIA in the last 168 hours. CBC:  Recent Labs Lab 10/04/14 0040 10/04/14 0655 10/05/14 0400  WBC 7.4 7.8 7.0  NEUTROABS 4.0 4.2  --   HGB 15.0 15.7 14.8  HCT 46.2 47.7 45.4  MCV 93.9 93.5 94.0  PLT 189 191 183   Cardiac Enzymes:  Recent Labs Lab 10/04/14 0655 10/04/14 1127 10/04/14 1916  TROPONINI 0.05* 0.06* 0.06*  BNP (last 3 results)  Recent Labs  10/04/14 0040  BNP 138.5*    ProBNP (last 3 results) No results for input(s): PROBNP in the last 8760 hours.  CBG:  Recent Labs Lab 10/04/14 0418 10/04/14 1119  GLUCAP 113* 126*    No results found for this or any previous visit (from the past 240 hour(s)).   Studies: US Renal  10/05/2014   CLINICAL DATA:  Initial evaluation acute kidney injury  EXAM: RENAL/URINARY TRACT ULTRASOUND COMPLETE  COMPARISON:  None.  FINDINGS: Right Kidney:  Length: 11.5 cm. Technically limited study with very limited detail of the right kidney. There does not appear to be  hydronephrosis and the echogenicity appears within normal limits. Further detail unavailable.  Left Kidney:  Length: 14.0 cm. Detail similarly limited as on the contralateral side. There is a 34 x 39 x 41 mm cyst in the midpole and 33 x 31 x 34 mm cyst in the lower pole. These are similar to the prior study.  Bladder:  Appears normal for degree of bladder distention.  IMPRESSION: Technically limited study showing no evidence of hydronephrosis.   Electronically Signed   By: Skipper Cliche M.D.   On: 10/05/2014 11:58   Dg Chest Port 1 View  10/04/2014   CLINICAL DATA:  Chronic shortness of breath and productive cough. Lower extremity swelling. Initial encounter.  EXAM: PORTABLE CHEST - 1 VIEW  COMPARISON:  Chest radiograph performed 09/27/2014  FINDINGS: The lungs are well-aerated. Mild vascular congestion is noted. Mild left basilar opacity may reflect atelectasis or possibly minimal interstitial edema. A small left pleural effusion is seen. There is no evidence of pneumothorax.  The cardiomediastinal silhouette is mildly enlarged. The patient is status post median sternotomy, with evidence of prior CABG. No acute osseous abnormalities are seen.  IMPRESSION: Mild vascular congestion and mild cardiomegaly. Left basilar airspace opacity may reflect atelectasis or possibly minimal interstitial edema, given clinical concern. Small left pleural effusion seen.   Electronically Signed   By: Garald Balding M.D.   On: 10/04/2014 02:01    Scheduled Meds: . antiseptic oral rinse  7 mL Mouth Rinse BID  . aspirin EC  81 mg Oral Daily  . atorvastatin  80 mg Oral q1800  . budesonide (PULMICORT) nebulizer solution  0.25 mg Nebulization BID  . clopidogrel  75 mg Oral Daily  . doxycycline (VIBRAMYCIN) IV  100 mg Intravenous BID  . enoxaparin (LOVENOX) injection  40 mg Subcutaneous Q24H  . furosemide  40 mg Oral BID  . hydrALAZINE  25 mg Oral TID  . ipratropium-albuterol  3 mL Nebulization Q4H  . iron polysaccharides   150 mg Oral BID  . isosorbide mononitrate  30 mg Oral Daily  . methylPREDNISolone (SOLU-MEDROL) injection  40 mg Intravenous Daily  . metolazone  2.5 mg Oral Q M,W,F  . montelukast  10 mg Oral QHS  . nebivolol  10 mg Oral Daily  . pantoprazole  80 mg Oral BID  . sodium chloride  3 mL Intravenous Q12H   Continuous Infusions:   Principal Problem:   Chest pain Active Problems:   CKD (chronic kidney disease), stage III   New onset atrial fibrillation   COPD exacerbation   COLD (chronic obstructive lung disease)    Time spent: 54 MINUTES    Tiberius Loftus  [facility] Pager [phone] If 7PM-7AM, please contact night-coverage at [email or web address], password [facility] 10/05/2014, 4:40 PM  LOS: 1 day

## 2014-10-06 ENCOUNTER — Inpatient Hospital Stay (HOSPITAL_COMMUNITY): Payer: Medicare Other

## 2014-10-06 DIAGNOSIS — J209 Acute bronchitis, unspecified: Secondary | ICD-10-CM | POA: Insufficient documentation

## 2014-10-06 DIAGNOSIS — R079 Chest pain, unspecified: Secondary | ICD-10-CM

## 2014-10-06 LAB — BASIC METABOLIC PANEL
Anion gap: 12 (ref 5–15)
BUN: 56 mg/dL — AB (ref 6–23)
CALCIUM: 8.4 mg/dL (ref 8.4–10.5)
CHLORIDE: 90 mmol/L — AB (ref 96–112)
CO2: 35 mmol/L — ABNORMAL HIGH (ref 19–32)
Creatinine, Ser: 2.45 mg/dL — ABNORMAL HIGH (ref 0.50–1.35)
GFR calc Af Amer: 27 mL/min — ABNORMAL LOW (ref >90)
GFR, EST NON AFRICAN AMERICAN: 23 mL/min — AB (ref >90)
Glucose, Bld: 161 mg/dL — ABNORMAL HIGH (ref 70–99)
Potassium: 3.4 mmol/L — ABNORMAL LOW (ref 3.5–5.1)
Sodium: 137 mmol/L (ref 135–145)

## 2014-10-06 LAB — CBC
HCT: 46.2 % (ref 39.0–52.0)
Hemoglobin: 15.1 g/dL (ref 13.0–17.0)
MCH: 30.6 pg (ref 26.0–34.0)
MCHC: 32.7 g/dL (ref 30.0–36.0)
MCV: 93.5 fL (ref 78.0–100.0)
Platelets: 174 10*3/uL (ref 150–400)
RBC: 4.94 MIL/uL (ref 4.22–5.81)
RDW: 13.8 % (ref 11.5–15.5)
WBC: 8.3 10*3/uL (ref 4.0–10.5)

## 2014-10-06 MED ORDER — POTASSIUM CHLORIDE CRYS ER 20 MEQ PO TBCR
40.0000 meq | EXTENDED_RELEASE_TABLET | Freq: Two times a day (BID) | ORAL | Status: DC
  Administered 2014-10-06: 40 meq via ORAL
  Filled 2014-10-06 (×2): qty 2

## 2014-10-06 MED ORDER — METHYLPREDNISOLONE SODIUM SUCC 125 MG IJ SOLR
60.0000 mg | Freq: Three times a day (TID) | INTRAMUSCULAR | Status: DC
  Administered 2014-10-06 – 2014-10-09 (×9): 60 mg via INTRAVENOUS
  Filled 2014-10-06 (×3): qty 0.96
  Filled 2014-10-06: qty 2
  Filled 2014-10-06 (×4): qty 0.96
  Filled 2014-10-06: qty 2
  Filled 2014-10-06 (×2): qty 0.96

## 2014-10-06 MED ORDER — PERFLUTREN LIPID MICROSPHERE
1.0000 mL | INTRAVENOUS | Status: AC | PRN
  Administered 2014-10-06: 3 mL via INTRAVENOUS
  Filled 2014-10-06: qty 10

## 2014-10-06 MED ORDER — ISOSORBIDE MONONITRATE ER 30 MG PO TB24
30.0000 mg | ORAL_TABLET | Freq: Every day | ORAL | Status: DC
  Administered 2014-10-06 – 2014-10-13 (×8): 30 mg via ORAL
  Filled 2014-10-06 (×7): qty 1

## 2014-10-06 MED ORDER — MAGIC MOUTHWASH W/LIDOCAINE
5.0000 mL | Freq: Three times a day (TID) | ORAL | Status: DC | PRN
  Administered 2014-10-07: 5 mL via ORAL
  Filled 2014-10-06 (×2): qty 5

## 2014-10-06 MED ORDER — NAPHAZOLINE HCL 0.1 % OP SOLN
1.0000 [drp] | Freq: Four times a day (QID) | OPHTHALMIC | Status: DC | PRN
  Filled 2014-10-06: qty 15

## 2014-10-06 MED ORDER — MENTHOL 3 MG MT LOZG
1.0000 | LOZENGE | OROMUCOSAL | Status: DC | PRN
Start: 2014-10-06 — End: 2014-10-13
  Administered 2014-10-07 – 2014-10-12 (×2): 3 mg via ORAL
  Filled 2014-10-06 (×2): qty 9

## 2014-10-06 NOTE — Progress Notes (Signed)
[facility] PROGRESS NOTE  [name] BVQ:[id number] DOB: 13-Aug-1930 DOA: 10/04/2014 PCP:  Melinda Crutch, MD Interim summary: 81 year old male admitted for left sided pleuritc chest pain and sob. He was admitted for  COPD exacerbation and evaluation of ACS. His troponins came back elevated and cardiology consulted.    Assessment/Plan: 1. Pleuritic chest pain:  Comes in intermittently. EKG done this afternoon shows sinus brady, without any significant ischemic changes. Has minimally elevated troponins. Echocardiogram done , showed left ventricular systolic function slightly reduced to 45% ,but no regional wall abnormalities. There was grade 1 diastolic dysfunction. Plan for stress test in am when his respiratory symptoms are improved.  Pain control as needed.    2. Copd exacerbation: he was initially started on 40 mg of IV solumedrol daily, increased the dose to 60 mg TID . Resume bronchodilators and Change pulmicort to symbicort. Ordered CXR for further evaluation.   3. Chronic atrial fibrillation: Rate controlled. Not a candidate for anticoagulation secondary to GI bleed.  Currently on aspirin and plavix.    4. Chronic diastolic heart failure: - does not appear to be fluid overloaded. But would get a repeat CXR today, as his echo demonstrated a slight declinne in LVEF.  - resume same dose of lasix and metolazone.  - further management as per cardiology.    Acute on CKD: - Unclear etiology, ? This appears to be his new baseline creatinine. Marland Kitchen  UA is negative for infection.  US renal does not show any hydronephrosis.      Hypertension: Well controlled.  Resume home meds.     Code Status: full code.  Family Communication: none at bedside today.  Disposition Plan: home with home PT.    Consultants:  cardiology  Procedures:  Echocardiogram.   Antibiotics:  Doxycycline.   HPI/Subjective: Sitting int he chair, on 3 lit [place] oxygen.   Objective: Filed Vitals:    10/06/14 1531  BP: 117/53  Pulse: 53  Temp:   Resp: 20    Intake/Output Summary (Last 24 hours) at 10/06/14 1641 Last data filed at 10/06/14 1348  Gross per 24 hour  Intake   1630 ml  Output   1326 ml  Net    304 ml   Filed Weights   10/04/14 0408 10/05/14 0635 10/06/14 0508  Weight: 125.601 kg (276 lb 14.4 oz) 125.498 kg (276 lb 10.8 oz) 125.193 kg (276 lb)    Exam:   General:  ALERT Afebrile in mild distress from sob  Cardiovascular: s1s2.   Respiratory: wheezing improved when compared to yesterday. No rhonchi.   Abdomen: soft non tender non distended bowel sounds heard.   Musculoskeletal: trace pedal edema.   Data Reviewed: Basic Metabolic Panel:  Recent Labs Lab 10/04/14 0040 10/04/14 0655 10/04/14 1127 10/05/14 0400 10/06/14 0544  NA 142 141 140 139 137  K 4.5 5.3* 3.9 3.8 3.4*  CL 101 99 98 95* 90*  CO2 30 33* 33* 33* 35*  GLUCOSE 123* 106* 136* 129* 161*  BUN 49* 46* 45* 53* 56*  CREATININE 2.43* 2.44* 2.39* 2.56* 2.45*  CALCIUM 9.2 9.1 9.1 8.8 8.4   Liver Function Tests:  Recent Labs Lab 10/04/14 0655  AST 58*  ALT 23  ALKPHOS 74  BILITOT 2.2*  PROT 6.6  ALBUMIN 3.5   No results for input(s): LIPASE, AMYLASE in the last 168 hours. No results for input(s): AMMONIA in the last 168 hours. CBC:  Recent Labs Lab 10/04/14 0040 10/04/14 0655 10/05/14  0400 10/06/14 0544  WBC 7.4 7.8 7.0 8.3  NEUTROABS 4.0 4.2  --   --   HGB 15.0 15.7 14.8 15.1  HCT 46.2 47.7 45.4 46.2  MCV 93.9 93.5 94.0 93.5  PLT 189 191 183 174   Cardiac Enzymes:  Recent Labs Lab 10/04/14 0655 10/04/14 1127 10/04/14 1916  TROPONINI 0.05* 0.06* 0.06*   BNP (last 3 results)  Recent Labs  10/04/14 0040  BNP 138.5*    ProBNP (last 3 results) No results for input(s): PROBNP in the last 8760 hours.  CBG:  Recent Labs Lab 10/04/14 0418 10/04/14 1119  GLUCAP 113* 126*    No results found for this or any previous visit (from the past 240  hour(s)).   Studies: US Renal  10/05/2014   CLINICAL DATA:  Initial evaluation acute kidney injury  EXAM: RENAL/URINARY TRACT ULTRASOUND COMPLETE  COMPARISON:  None.  FINDINGS: Right Kidney:  Length: 11.5 cm. Technically limited study with very limited detail of the right kidney. There does not appear to be hydronephrosis and the echogenicity appears within normal limits. Further detail unavailable.  Left Kidney:  Length: 14.0 cm. Detail similarly limited as on the contralateral side. There is a 34 x 39 x 41 mm cyst in the midpole and 33 x 31 x 34 mm cyst in the lower pole. These are similar to the prior study.  Bladder:  Appears normal for degree of bladder distention.  IMPRESSION: Technically limited study showing no evidence of hydronephrosis.   Electronically Signed   By: Skipper Cliche M.D.   On: 10/05/2014 11:58    Scheduled Meds: . antiseptic oral rinse  7 mL Mouth Rinse BID  . aspirin EC  81 mg Oral Daily  . atorvastatin  80 mg Oral q1800  . budesonide-formoterol  2 puff Inhalation BID  . clopidogrel  75 mg Oral Daily  . doxycycline (VIBRAMYCIN) IV  100 mg Intravenous BID  . enoxaparin (LOVENOX) injection  40 mg Subcutaneous Q24H  . furosemide  40 mg Oral BID  . hydrALAZINE  25 mg Oral TID  . ipratropium-albuterol  3 mL Nebulization Q4H  . iron polysaccharides  150 mg Oral BID  . isosorbide mononitrate  30 mg Oral Daily  . methylPREDNISolone (SOLU-MEDROL) injection  60 mg Intravenous 3 times per day  . metolazone  2.5 mg Oral Q M,W,F  . montelukast  10 mg Oral QHS  . nebivolol  10 mg Oral Daily  . pantoprazole  80 mg Oral BID  . sodium chloride  3 mL Intravenous Q12H   Continuous Infusions:   Principal Problem:   Chest pain Active Problems:   CKD (chronic kidney disease), stage III   New onset atrial fibrillation   COPD exacerbation   COLD (chronic obstructive lung disease)   Acute kidney injury   Atrial fibrillation, unspecified    Time spent: 25  MINUTES    Valbona Slabach  [facility] Pager [phone] If 7PM-7AM, please contact night-coverage at [email or web address], password [facility] 10/06/2014, 4:41 PM  LOS: 2 days

## 2014-10-06 NOTE — Progress Notes (Signed)
1525 made hourly rounding on pt. Upon entering pt room pt states he has 2/10 chest pain that radiates down left arm. A stat EKG done, vitals stable, Brittainy Simmons PA paged.  1535 pt stated chest pain is no longer there.   No new orders given. Will continue to monitor pt.

## 2014-10-06 NOTE — Progress Notes (Signed)
Echocardiogram 2D Echocardiogram with Definity has been performed.  [name] 10/06/2014, 1:57 PM

## 2014-10-06 NOTE — Assessment & Plan Note (Addendum)
Slow to resolve flare  cxr with no acute process  Has chronic sinus dz -set up for CT sinus to r/o acute infection  Discussed with pt that if symptoms worsen or do not improve will need hospital admit   Plan  Levaquin 500mg  daily for 7 days  Prednisone taper. Over next  week Mucinex DM twice daily as needed for cough and congestion. We are setting you up for a CT sinus    If you are not improving will need admission to hospital.  Follow Dr. Joya Gaskins in 2  weeks and as needed. Please contact office for sooner follow up if symptoms do not improve or worsen or seek emergency care

## 2014-10-06 NOTE — Progress Notes (Signed)
Patient ID: [name], male   DOB: [birth year], 81 y.o.   MRN: [id number]     Subjective:    No chest pain overnight. + SOB  Objective:   Temp:  [97.6 F (36.4 C)-98.4 F (36.9 C)] 97.8 F (36.6 C) (03/20 0508) Pulse Rate:  [91-105] 94 (03/20 0508) Resp:  [18-20] 18 (03/20 0508) BP: (101-124)/(60-69) 124/69 mmHg (03/20 0508) SpO2:  [93 %-97 %] 93 % (03/20 0758) Weight:  [276 lb (125.193 kg)] 276 lb (125.193 kg) (03/20 0508) Last BM Date: 10/05/14  Filed Weights   10/04/14 0408 10/05/14 2706 10/06/14 0508  Weight: 276 lb 14.4 oz (125.601 kg) 276 lb 10.8 oz (125.498 kg) 276 lb (125.193 kg)    Intake/Output Summary (Last 24 hours) at 10/06/14 0924 Last data filed at 10/06/14 0431  Gross per 24 hour  Intake   3660 ml  Output   1851 ml  Net   1809 ml    Telemetry: SR and sinus tach  Exam:  General: NAD  Resp: bilateral wheezing  Cardiac:RRR, no m/r/g, no JVD  CB:JSEGBTD soft, NT, ND  MSK:no LE edema  Neuro: no focal deficits   Lab Results:  Basic Metabolic Panel:  Recent Labs Lab 10/04/14 1127 10/05/14 0400 10/06/14 0544  NA 140 139 137  K 3.9 3.8 3.4*  CL 98 95* 90*  CO2 33* 33* 35*  GLUCOSE 136* 129* 161*  BUN 45* 53* 56*  CREATININE 2.39* 2.56* 2.45*  CALCIUM 9.1 8.8 8.4    Liver Function Tests:  Recent Labs Lab 10/04/14 0655  AST 58*  ALT 23  ALKPHOS 74  BILITOT 2.2*  PROT 6.6  ALBUMIN 3.5    CBC:  Recent Labs Lab 10/04/14 0655 10/05/14 0400 10/06/14 0544  WBC 7.8 7.0 8.3  HGB 15.7 14.8 15.1  HCT 47.7 45.4 46.2  MCV 93.5 94.0 93.5  PLT 191 183 174    Cardiac Enzymes:  Recent Labs Lab 10/04/14 0655 10/04/14 1127 10/04/14 1916  TROPONINI 0.05* 0.06* 0.06*    BNP: No results for input(s): PROBNP in the last 8760 hours.  Coagulation: No results for input(s): INR in the last 168 hours.  ECG:   Medications:   Scheduled Medications: . antiseptic oral rinse  7 mL Mouth Rinse BID  . aspirin EC  81 mg Oral  Daily  . atorvastatin  80 mg Oral q1800  . budesonide-formoterol  2 puff Inhalation BID  . clopidogrel  75 mg Oral Daily  . doxycycline (VIBRAMYCIN) IV  100 mg Intravenous BID  . enoxaparin (LOVENOX) injection  40 mg Subcutaneous Q24H  . furosemide  40 mg Oral BID  . hydrALAZINE  25 mg Oral TID  . ipratropium-albuterol  3 mL Nebulization Q4H  . iron polysaccharides  150 mg Oral BID  . isosorbide mononitrate  30 mg Oral Daily  . methylPREDNISolone (SOLU-MEDROL) injection  60 mg Intravenous Q12H  . metolazone  2.5 mg Oral Q M,W,F  . montelukast  10 mg Oral QHS  . nebivolol  10 mg Oral Daily  . pantoprazole  80 mg Oral BID  . sodium chloride  3 mL Intravenous Q12H     Infusions:     PRN Medications:  acetaminophen **OR** acetaminophen, levalbuterol, nitroGLYCERIN, ondansetron **OR** ondansetron (ZOFRAN) IV     Assessment/Plan    1. Chest pain - hx of prior CABG, prior DES in 2013. Last stress test 2013 was negative.  - trivial troponin elevation 0.05 to 0.06, EKG without ischemic changes.  -  awaiting echo - still with active wheezing, would not perform Lexiscan at this time. Continue COPD management per primary team, once improved plan would be for Lexiscan. Will NPO tonight as well in case.   2. Afib - new onset, patient has hx of GI bleed and has not been started on anticoag - continue rate control and ASA  3. Chronic HF with preserved LVEF - Jan 2015 echo LVEF 50-55%, no WMAs, normal diastolic function - CXR with mild congestion, BNP 138 - does not appear volume overloaded by exam  4. COPD - exacerbation management per primary team. Still with active wheezing   5. CKD stage 3 3        Carlyle Dolly, M.D.

## 2014-10-06 NOTE — Evaluation (Signed)
Physical Therapy Evaluation Patient Details Name: [name] MRN: [id number] DOB: 1931-03-17 Today's Date: 10/06/2014   History of Present Illness  Patient is an 81 yo male admitted 10/04/14 with SOB and chest pain. Patient with COPD exacerbation and Afib.  PMH:  CAD, CABG, CHF, HTN, COPD, CKD, syncope, HOH, OSA, arthritis, on home O2 at 2 l/min  Clinical Impression  Patient presents with problems listed below.  Will benefit from acute PT to maximize independence prior to discharge.  Patient will need to function at Mod I level to return home safely.    Follow Up Recommendations Home health PT;Supervision - Intermittent    Equipment Recommendations  None recommended by PT    Recommendations for Other Services       Precautions / Restrictions Precautions Precautions: Fall Precaution Comments: Patient with h/o syncopal episodes with falls - 4 in last 6 months.  Most recent fall was 1 week ago. Restrictions Weight Bearing Restrictions: No      Mobility  Bed Mobility               General bed mobility comments: Patient does not sleep in bed - sleeps in recliner.  Transfers                 General transfer comment: Patient declined - had been up to bathroom and too fatigued.  Dyspnea 3/4 and wheezing with conversation.  Ambulation/Gait                Stairs            Wheelchair Mobility    Modified Rankin (Stroke Patients Only)       Balance                                             Pertinent Vitals/Pain Pain Assessment: 0-10 Pain Score: 4  Pain Location: Lt chest wall. Pain Descriptors / Indicators: Sore Pain Intervention(s): Monitored during session    Home Living Family/patient expects to be discharged to:: Private residence Living Arrangements: Children (Son and granddaughter) Available Help at Discharge: Family;Available PRN/intermittently (Son works in afternoon/pm.) Type of Home: House Home Access:  Stairs to enter Entrance Stairs-Rails: Psychiatric nurse of Steps: 2 Home Layout: One level Home Equipment: Environmental consultant - 2 wheels;Cane - single point;Bedside commode      Prior Function Level of Independence: Independent with assistive device(s);Needs assistance   Gait / Transfers Assistance Needed: Uses RW or cane outside of home.  Patient reports he is supposed to use RW at home, but holds on to furniture to get around.  ADL's / Homemaking Assistance Needed: Has someone in the house when he takes his shower for safety.  Son assists with meal prep and housekeeping.        Hand Dominance   Dominant Hand: Right    Extremity/Trunk Assessment   Upper Extremity Assessment: Overall WFL for tasks assessed           Lower Extremity Assessment: Generalized weakness      Cervical / Trunk Assessment: Normal  Communication   Communication: HOH  Cognition Arousal/Alertness: Awake/alert Behavior During Therapy: WFL for tasks assessed/performed Overall Cognitive Status: Within Functional Limits for tasks assessed                      General Comments      Exercises General  Exercises - Upper Extremity Shoulder Flexion: AROM;Both;5 reps;Seated Elbow Flexion: AROM;Both;10 reps;Seated General Exercises - Lower Extremity Ankle Circles/Pumps: AROM;Both;10 reps;Seated Long Arc Quad: AROM;Both;5 reps;Seated Hip Flexion/Marching: AROM;Both;5 reps;Seated      Assessment/Plan    PT Assessment Patient needs continued PT services  PT Diagnosis Difficulty walking;Generalized weakness   PT Problem List Decreased strength;Decreased activity tolerance;Decreased balance;Decreased mobility;Decreased knowledge of use of DME;Cardiopulmonary status limiting activity  PT Treatment Interventions DME instruction;Gait training;Functional mobility training;Therapeutic activities;Therapeutic exercise;Patient/family education   PT Goals (Current goals can be found in the Care  Plan section) Acute Rehab PT Goals Patient Stated Goal: To breathe easier PT Goal Formulation: With patient Time For Goal Achievement: 10/13/14 Potential to Achieve Goals: Good    Frequency Min 3X/week   Barriers to discharge Decreased caregiver support Patient is home alone while son is at work.    Co-evaluation               End of Session Equipment Utilized During Treatment: Oxygen Activity Tolerance: Patient limited by fatigue (Limited by DOE) Patient left: in chair;with call bell/phone within reach;with family/visitor present           Time: 1439-1455 PT Time Calculation (min) (ACUTE ONLY): 16 min   Charges:   PT Evaluation $Initial PT Evaluation Tier I: 1 Procedure     PT G CodesDespina Pole 11/03/2014, 3:42 PM Carita Pian. Sanjuana Kava, [place] Pager [phone]

## 2014-10-06 NOTE — Progress Notes (Signed)
   Subjective:    Patient ID: [name], male    DOB: [birth year], 81 y.o.   MRN: [id number]  HPI 81 yo male never smoker with chronic bronchitis and sinusitis  On Chronic Diastolic CHF   03/23/6386 Acute OV  Complains of increased dyspnea, cough, congestion, wheezing and tightness for 2 weeks.  Taking mucinex. . Denies f/c/s, hemoptysis,   chest pain or orthopnea. No increased leg swelling.   Remains on Flovent and Spiriva. -says he taking correctly.  Remains on on Oxygen with act and At bedtime   No recent ER or hospitalizations >>[facility]   09/27/14 Acute OV  Complains of persistent symptoms of cough and congestion .  Has noticed increased DOE with activity , increased his O2 to 2.5L from 2l/m . Helped some. Seen last week in office with bronchitic symptoms with cough and congestion. Rx Zapck. Does not feel it has helped, Still has thick white yellow mucus. No f/c/s, n/v, hemoptysis or edema  CXR today shows chronic changes.  Taking mucinex.  No ER or admission recently.  Has a lot of sinus congestion and drainage .      Review of Systems Constitutional:   No  weight loss, night sweats,  Fevers, chills,  +fatigue, or  lassitude.  HEENT:   No headaches,  Difficulty swallowing,  Tooth/dental problems, or  Sore throat,                No sneezing, itching, ear ache,  +nasal congestion, post nasal drip,   CV:  No chest pain,  Orthopnea, PND,   syncope.   GI  No heartburn, indigestion, abdominal pain, nausea, vomiting, diarrhea, change in bowel habits, loss of appetite, bloody stools.   Resp:    No chest wall deformity  Skin: no rash or lesions.  GU: no dysuria, change in color of urine, no urgency or frequency.  No flank pain, no hematuria   MS:  No joint pain or swelling.  No decreased range of motion.  No back pain.  Psych:  No change in mood or affect. No depression or anxiety.  No memory loss.         Objective:   Physical Exam GEN: A/Ox3; pleasant , NAD, obese    HEENT:  [place]/AT,  EACs-clear, TMs-wnl, NOSE-clear, THROAT-clear, no lesions, no postnasal drip or exudate noted.   NECK:  Supple w/ fair ROM; no JVD; normal carotid impulses w/o bruits; no thyromegaly or nodules palpated; no lymphadenopathy.  RESP  Coarse Rhonchi bilaterally  no accessory muscle use, no dullness to percussion  CARD:  RRR, no m/r/g  , tr  peripheral edema, pulses intact, no cyanosis or clubbing.  GI:   Soft & nt; nml bowel sounds; no organomegaly or masses detected.  Musco: Warm bil, no deformities or joint swelling noted.   Neuro: alert, no focal deficits noted.    Skin: Warm, no lesions or rashes   CXR 09/27/14 Chronic changes       Assessment & Plan:

## 2014-10-07 ENCOUNTER — Encounter: Payer: Self-pay | Admitting: Adult Health

## 2014-10-07 DIAGNOSIS — I480 Paroxysmal atrial fibrillation: Secondary | ICD-10-CM

## 2014-10-07 DIAGNOSIS — J441 Chronic obstructive pulmonary disease with (acute) exacerbation: Principal | ICD-10-CM

## 2014-10-07 DIAGNOSIS — R748 Abnormal levels of other serum enzymes: Secondary | ICD-10-CM

## 2014-10-07 DIAGNOSIS — J329 Chronic sinusitis, unspecified: Secondary | ICD-10-CM | POA: Insufficient documentation

## 2014-10-07 LAB — CBC
HCT: 45.4 % (ref 39.0–52.0)
Hemoglobin: 15.1 g/dL (ref 13.0–17.0)
MCH: 30.6 pg (ref 26.0–34.0)
MCHC: 33.3 g/dL (ref 30.0–36.0)
MCV: 92.1 fL (ref 78.0–100.0)
Platelets: 159 10*3/uL (ref 150–400)
RBC: 4.93 MIL/uL (ref 4.22–5.81)
RDW: 13.7 % (ref 11.5–15.5)
WBC: 9.6 10*3/uL (ref 4.0–10.5)

## 2014-10-07 LAB — BASIC METABOLIC PANEL
Anion gap: 13 (ref 5–15)
BUN: 66 mg/dL — ABNORMAL HIGH (ref 6–23)
CO2: 34 mmol/L — AB (ref 19–32)
CREATININE: 2.65 mg/dL — AB (ref 0.50–1.35)
Calcium: 8.7 mg/dL (ref 8.4–10.5)
Chloride: 90 mmol/L — ABNORMAL LOW (ref 96–112)
GFR calc Af Amer: 24 mL/min — ABNORMAL LOW (ref >90)
GFR calc non Af Amer: 21 mL/min — ABNORMAL LOW (ref >90)
GLUCOSE: 168 mg/dL — AB (ref 70–99)
Potassium: 3.1 mmol/L — ABNORMAL LOW (ref 3.5–5.1)
Sodium: 137 mmol/L (ref 135–145)

## 2014-10-07 LAB — MAGNESIUM: Magnesium: 2.2 mg/dL (ref 1.5–2.5)

## 2014-10-07 MED ORDER — LEVOFLOXACIN IN D5W 750 MG/150ML IV SOLN
750.0000 mg | Freq: Once | INTRAVENOUS | Status: AC
  Administered 2014-10-07: 750 mg via INTRAVENOUS
  Filled 2014-10-07: qty 150

## 2014-10-07 MED ORDER — BENZONATATE 100 MG PO CAPS
200.0000 mg | ORAL_CAPSULE | Freq: Three times a day (TID) | ORAL | Status: DC | PRN
  Administered 2014-10-11: 200 mg via ORAL
  Filled 2014-10-07 (×3): qty 2

## 2014-10-07 MED ORDER — PNEUMOCOCCAL VAC POLYVALENT 25 MCG/0.5ML IJ INJ
0.5000 mL | INJECTION | INTRAMUSCULAR | Status: AC
  Administered 2014-10-08: 0.5 mL via INTRAMUSCULAR
  Filled 2014-10-07: qty 0.5

## 2014-10-07 MED ORDER — GUAIFENESIN-DM 100-10 MG/5ML PO SYRP
5.0000 mL | ORAL_SOLUTION | ORAL | Status: DC | PRN
  Administered 2014-10-08: 5 mL via ORAL
  Filled 2014-10-07: qty 5

## 2014-10-07 MED ORDER — INFLUENZA VAC SPLIT QUAD 0.5 ML IM SUSY
0.5000 mL | PREFILLED_SYRINGE | INTRAMUSCULAR | Status: AC
  Administered 2014-10-08: 0.5 mL via INTRAMUSCULAR
  Filled 2014-10-07: qty 0.5

## 2014-10-07 MED ORDER — POTASSIUM CHLORIDE CRYS ER 20 MEQ PO TBCR
40.0000 meq | EXTENDED_RELEASE_TABLET | Freq: Three times a day (TID) | ORAL | Status: DC
  Administered 2014-10-07: 40 meq via ORAL
  Filled 2014-10-07: qty 2

## 2014-10-07 MED ORDER — LEVOFLOXACIN IN D5W 500 MG/100ML IV SOLN: 500.0000 mg | INTRAVENOUS | Status: DC

## 2014-10-07 MED ORDER — BUDESONIDE 0.25 MG/2ML IN SUSP
0.2500 mg | Freq: Two times a day (BID) | RESPIRATORY_TRACT | Status: DC
  Administered 2014-10-07 – 2014-10-09 (×4): 0.25 mg via RESPIRATORY_TRACT
  Filled 2014-10-07 (×7): qty 2

## 2014-10-07 MED ORDER — LEVOFLOXACIN IN D5W 500 MG/100ML IV SOLN
500.0000 mg | INTRAVENOUS | Status: DC
  Administered 2014-10-09 – 2014-10-11 (×2): 500 mg via INTRAVENOUS
  Filled 2014-10-07 (×3): qty 100

## 2014-10-07 MED ORDER — ENOXAPARIN SODIUM 30 MG/0.3ML ~~LOC~~ SOLN
30.0000 mg | SUBCUTANEOUS | Status: DC
  Administered 2014-10-07 – 2014-10-08 (×2): 30 mg via SUBCUTANEOUS
  Filled 2014-10-07 (×4): qty 0.3

## 2014-10-07 MED ORDER — ARFORMOTEROL TARTRATE 15 MCG/2ML IN NEBU
15.0000 ug | INHALATION_SOLUTION | Freq: Two times a day (BID) | RESPIRATORY_TRACT | Status: DC
  Administered 2014-10-07 – 2014-10-13 (×12): 15 ug via RESPIRATORY_TRACT
  Filled 2014-10-07 (×15): qty 2

## 2014-10-07 NOTE — Care Management Note (Signed)
    Page 1 of 2   10/13/2014     12:45:46 PM CARE MANAGEMENT NOTE 10/13/2014  Patient:  [name], [name]   Account Number:  [id number]  Date Initiated:  10/04/2014  Documentation initiated by:  [facility]  Subjective/Objective Assessment:   CHF, COPD exac     Action/Plan:   PTA, pt resides at home with son.   Anticipated DC Date:  10/10/2014   Anticipated DC Plan:  [place]  CM consult      [facility] Choice  HOME HEALTH   Choice offered to / List presented to:  C-1 Patient        [place] arranged  HH-1 RN  [place] PT      [place].   Status of service:  Completed, signed off Medicare Important Message given?  YES (If response is "NO", the following Medicare IM given date fields will be blank) Date Medicare IM given:  10/07/2014 Medicare IM given by:  AMERSON,JULIE Date Additional Medicare IM given:  10/10/2014 Additional Medicare IM given by:  JULIE AMERSON  Discharge Disposition:    Per UR Regulation:  Reviewed for med. necessity/level of care/duration of stay  If discussed at [place] of Stay Meetings, dates discussed:    Comments:  10/13/14 CM received call from RN notifying of discharge. Cm called [place] DME rep, Jeneen Rinks to please deliver a rolling walker to room so pt can discharge.  Cm called [facility] rep, Katie to notify of pt discharge and schedule SOC.  No other CM needs were communicated.  [name], BSN, Cm [phone].   10/09/14 Fred Lambert, RN, BSN [phone] Referral to [facility] for [facility] follow up, per pt choice.  Start of care 24-48h post dc date.  10/07/14 Fred Lambert, RN, BSN [phone] PTA, pt resides at home with son; PT recommending HHPT at dc.  Would recommend [facility] for f/u.  Pt has home O2 with [facility].  Will follow.  10/04/2014 1600 Chart reviewed. UR completed. Fred Finner RN CCM Case Mgmt phone [phone]

## 2014-10-07 NOTE — Progress Notes (Signed)
Physical Therapy Treatment Patient Details Name: [name] MRN: [id number] DOB: 1931-02-17 Today's Date: 10/07/2014    History of Present Illness Patient is an 81 yo male admitted 10/04/14 with SOB and chest pain. Patient with COPD exacerbation and Afib.  PMH:  CAD, CABG, CHF, HTN, COPD, CKD, syncope, HOH, OSA, arthritis, on home O2 at 2 l/min    PT Comments    Patient with decreased balance with gait, and fatigues quickly.  Recommend 4-wheeled RW (rollator) for home use.  Making slow gains with mobility.   Follow Up Recommendations  Home health PT;Supervision - Intermittent     Equipment Recommendations  Other (comment) (Rollator (4-wheeled walker))    Recommendations for Other Services       Precautions / Restrictions Precautions Precautions: Fall Precaution Comments: Patient with h/o syncopal episodes with falls - 4 in last 6 months.  Most recent fall was 1 week ago. Restrictions Weight Bearing Restrictions: No    Mobility  Bed Mobility               General bed mobility comments: Patient does not sleep in bed - sleeps in recliner.  Transfers Overall transfer level: Needs assistance Equipment used: None Transfers: Sit to/from Stand Sit to Stand: Min assist         General transfer comment: Verbal cues for hand placement.  Patient scoots to edge of chair independently.  Assist to rise to standing and with balance initially.  Stood for several seconds prior to ambulation.  Ambulation/Gait Ambulation/Gait assistance: Min guard Ambulation Distance (Feet): 26 Feet Assistive device: None Gait Pattern/deviations: Step-through pattern;Decreased step length - right;Decreased step length - left;Decreased stride length;Shuffle;Staggering left;Staggering right;Trunk flexed Gait velocity: Decreased Gait velocity interpretation: Below normal speed for age/gender General Gait Details: Patient ambulated without assistive device.  Patient with unsteady gait, and  reaching for sink for support.  Patient with DOE os 4/4 with short distances and O2 on.  Able to recover once sitting within 1 minute.  Recommend patient use RW for balance/safety.  Would benefit from rollator to have seat available.   Stairs            Wheelchair Mobility    Modified Rankin (Stroke Patients Only)       Balance                                    Cognition Arousal/Alertness: Awake/alert Behavior During Therapy: WFL for tasks assessed/performed Overall Cognitive Status: Within Functional Limits for tasks assessed                      Exercises      General Comments        Pertinent Vitals/Pain Pain Assessment: No/denies pain    Home Living                      Prior Function            PT Goals (current goals can now be found in the care plan section) Progress towards PT goals: Progressing toward goals    Frequency  Min 3X/week    PT Plan Current plan remains appropriate;Equipment recommendations need to be updated    Co-evaluation             End of Session Equipment Utilized During Treatment: Gait belt;Oxygen Activity Tolerance: Patient limited by fatigue (Limited by DOE) Patient left:  in chair;with call bell/phone within reach     Time: 1410-1424 PT Time Calculation (min) (ACUTE ONLY): 14 min  Charges:  $Gait Training: 8-22 mins                    G Codes:      Despina Pole October 27, 2014, 5:27 PM Carita Pian. Sanjuana Kava, [place] Pager [phone]

## 2014-10-07 NOTE — Progress Notes (Addendum)
[facility] PROGRESS NOTE  [name] VQQ:[id number] DOB: Apr 15, 1931 DOA: 10/04/2014 PCP:  Melinda Crutch, MD Interim summary: 81 year old male admitted for left sided pleuritc chest pain and sob. He was admitted for  COPD exacerbation and evaluation of ACS. His troponins came back elevated and cardiology consulted.    Assessment/Plan: 1. Pleuritic chest pain:  Comes in intermittently. EKG does not show any significant ischemic changes.  Has minimally elevated troponins. Echocardiogram done , showed left ventricular systolic function slightly reduced to 45% ,but no regional wall abnormalities. There was grade 1 diastolic dysfunction. Plan for stress test in am when his respiratory symptoms are improved.  Pain control as needed.   Copd exacerbation: he was initially started on 40 mg of IV solumedrol daily, increased the dose to 60 mg TID . Resume bronchodilators and Change the symbicort to brovana and pulmicort neb treatments. If his respiratory status does not improve in am, plan to call pulmonary to assist [place]. Stopped doxycycline and added levaquin . Started him on robitussin.   3. Chronic atrial fibrillation: Rate controlled. Not a candidate for anticoagulation secondary to GI bleed.  Currently on aspirin and plavix.    4. Chronic diastolic heart failure: - does not appear to be fluid overloaded. CXR shows basilar atelectasis, incentive spirometry ordered.  his echo this admission demonstrated a slight declinne in LVEF.  Stopped lasix and metolazone for his worsening renal parameters.  - further management as per cardiology.    Acute on CKD: - Unclear etiology,  sligtly worse today. Cardiology has stopped lasix and metolazone. Repeat renal parameters in am UA is negative for infection.  US renal does not show any hydronephrosis.    HYpokalemia: replete as needed. Repeat in am.  Check magnesium level in am.   Hypertension: Well controlled.  Resume home meds.     Code  Status: full code.  Family Communication: none at bedside today.  Disposition Plan: home with home PT.    Consultants:  cardiology  Procedures:  Echocardiogram.   Antibiotics:  Levaquin.   HPI/Subjective: In the chair, denies any chest pain or sob.  on 3 lit [place] oxygen.   Objective: Filed Vitals:   10/07/14 1512  BP: 111/70  Pulse: 98  Temp: 97.9 F (36.6 C)  Resp: 20    Intake/Output Summary (Last 24 hours) at 10/07/14 1837 Last data filed at 10/07/14 1828  Gross per 24 hour  Intake   2180 ml  Output   1325 ml  Net    855 ml   Filed Weights   10/05/14 0635 10/06/14 0508 10/07/14 0534  Weight: 125.498 kg (276 lb 10.8 oz) 125.193 kg (276 lb) 126.281 kg (278 lb 6.4 oz)    Exam:   General:  ALERT Afebrile in mild distress from sob still on 3 liters of oxygen.   Cardiovascular: s1s2. Slightly tachycardic.   Respiratory: wheezing improved when compared to yesterday. No rhonchi.   Abdomen: soft non tender non distended bowel sounds heard.   Musculoskeletal: trace pedal edema.   Data Reviewed: Basic Metabolic Panel:  Recent Labs Lab 10/04/14 0655 10/04/14 1127 10/05/14 0400 10/06/14 0544 10/07/14 0531  NA 141 140 139 137 137  K 5.3* 3.9 3.8 3.4* 3.1*  CL 99 98 95* 90* 90*  CO2 33* 33* 33* 35* 34*  GLUCOSE 106* 136* 129* 161* 168*  BUN 46* 45* 53* 56* 66*  CREATININE 2.44* 2.39* 2.56* 2.45* 2.65*  CALCIUM 9.1 9.1 8.8 8.4 8.7  MG  --   --   --   --  2.2   Liver Function Tests:  Recent Labs Lab 10/04/14 0655  AST 58*  ALT 23  ALKPHOS 74  BILITOT 2.2*  PROT 6.6  ALBUMIN 3.5   No results for input(s): LIPASE, AMYLASE in the last 168 hours. No results for input(s): AMMONIA in the last 168 hours. CBC:  Recent Labs Lab 10/04/14 0040 10/04/14 0655 10/05/14 0400 10/06/14 0544 10/07/14 0531  WBC 7.4 7.8 7.0 8.3 9.6  NEUTROABS 4.0 4.2  --   --   --   HGB 15.0 15.7 14.8 15.1 15.1  HCT 46.2 47.7 45.4 46.2 45.4  MCV 93.9 93.5 94.0 93.5  92.1  PLT 189 191 183 174 159   Cardiac Enzymes:  Recent Labs Lab 10/04/14 0655 10/04/14 1127 10/04/14 1916  TROPONINI 0.05* 0.06* 0.06*   BNP (last 3 results)  Recent Labs  10/04/14 0040  BNP 138.5*    ProBNP (last 3 results) No results for input(s): PROBNP in the last 8760 hours.  CBG:  Recent Labs Lab 10/04/14 0418 10/04/14 1119  GLUCAP 113* 126*    No results found for this or any previous visit (from the past 240 hour(s)).   Studies: Dg Chest 2 View  10/06/2014   CLINICAL DATA:  Subsequent evaluation for generalized chest pain and shortness of breath for 5 days, history of COPD and hypertension  EXAM: CHEST  2 VIEW  COMPARISON:  10/04/2014  FINDINGS: Stable mild cardiac enlargement. Status post CABG previously. Vascular pattern normal. Mild opacity in the bilateral medial lung bases, slightly increased on the left. This is most consistent with atelectasis. Mild vascular congestion with no pulmonary edema.  IMPRESSION: Mild bibasilar airspace disease most likely representing atelectasis.   Electronically Signed   By: Skipper Cliche M.D.   On: 10/06/2014 19:42    Scheduled Meds: . antiseptic oral rinse  7 mL Mouth Rinse BID  . aspirin EC  81 mg Oral Daily  . atorvastatin  80 mg Oral q1800  . budesonide-formoterol  2 puff Inhalation BID  . clopidogrel  75 mg Oral Daily  . doxycycline (VIBRAMYCIN) IV  100 mg Intravenous BID  . enoxaparin (LOVENOX) injection  30 mg Subcutaneous Q24H  . hydrALAZINE  25 mg Oral TID  . [START ON 10/08/2014] Influenza vac split quadrivalent PF  0.5 mL Intramuscular Tomorrow-1000  . ipratropium-albuterol  3 mL Nebulization Q4H  . iron polysaccharides  150 mg Oral BID  . isosorbide mononitrate  30 mg Oral Daily  . methylPREDNISolone (SOLU-MEDROL) injection  60 mg Intravenous 3 times per day  . montelukast  10 mg Oral QHS  . nebivolol  10 mg Oral Daily  . pantoprazole  80 mg Oral BID  . [START ON 10/08/2014] pneumococcal 23 valent  vaccine  0.5 mL Intramuscular Tomorrow-1000  . sodium chloride  3 mL Intravenous Q12H   Continuous Infusions:   Principal Problem:   Chest pain Active Problems:   CKD (chronic kidney disease), stage III   New onset atrial fibrillation   COPD exacerbation   COLD (chronic obstructive lung disease)   Acute kidney injury   Atrial fibrillation, unspecified    Time spent: 25 MINUTES    Doxie Augenstein  [facility] Pager [phone] If 7PM-7AM, please contact night-coverage at [email or web address], password [facility] 10/07/2014, 6:37 PM  LOS: 3 days

## 2014-10-07 NOTE — Progress Notes (Signed)
Patient: [name] / Admit Date: 10/04/2014 / Date of Encounter: 10/07/2014, 7:26 AM   Subjective: No CP today. Still SOB and wheezing. C/o sore throat.   Objective: Telemetry: NSR/SB, ?wandering atrial pacemaker at times Physical Exam: Blood pressure 123/65, pulse 91, temperature 98.9 F (37.2 C), temperature source Oral, resp. rate 19, height 5\' 11"  (1.803 m), weight 278 lb 6.4 oz (126.281 kg), SpO2 94 %. General: Well developed, well nourished, in no acute distress. Chronically ill appearing. Head: Normocephalic, atraumatic, sclera non-icteric, no xanthomas, nares are without discharge. Neck: JVP not elevated. Lungs: Rhonchorous bilaterally with audible expiratory wheezing. No rales. Breathing is unlabored. Heart: RRR S1 S2 without murmurs, rubs, or gallops.  Abdomen: Soft, non-tender, non-distended with normoactive bowel sounds. No rebound/guarding. Extremities: No clubbing or cyanosis. No edema. Distal pedal pulses are 2+ and equal bilaterally. Neuro: Alert and oriented X 3. Moves all extremities spontaneously. Psych:  Responds to questions appropriately with a normal affect.   Intake/Output Summary (Last 24 hours) at 10/07/14 0726 Last data filed at 10/07/14 0600  Gross per 24 hour  Intake   2060 ml  Output   1025 ml  Net   1035 ml    Inpatient Medications:  . antiseptic oral rinse  7 mL Mouth Rinse BID  . aspirin EC  81 mg Oral Daily  . atorvastatin  80 mg Oral q1800  . budesonide-formoterol  2 puff Inhalation BID  . clopidogrel  75 mg Oral Daily  . doxycycline (VIBRAMYCIN) IV  100 mg Intravenous BID  . enoxaparin (LOVENOX) injection  40 mg Subcutaneous Q24H  . furosemide  40 mg Oral BID  . hydrALAZINE  25 mg Oral TID  . ipratropium-albuterol  3 mL Nebulization Q4H  . iron polysaccharides  150 mg Oral BID  . isosorbide mononitrate  30 mg Oral Daily  . methylPREDNISolone (SOLU-MEDROL) injection  60 mg Intravenous 3 times per day  . metolazone  2.5 mg Oral Q  M,W,F  . montelukast  10 mg Oral QHS  . nebivolol  10 mg Oral Daily  . pantoprazole  80 mg Oral BID  . potassium chloride  40 mEq Oral BID  . sodium chloride  3 mL Intravenous Q12H   Infusions:    Labs:  Recent Labs  10/06/14 0544 10/07/14 0531  NA 137 137  K 3.4* 3.1*  CL 90* 90*  CO2 35* 34*  GLUCOSE 161* 168*  BUN 56* 66*  CREATININE 2.45* 2.65*  CALCIUM 8.4 8.7  MG  --  2.2   No results for input(s): AST, ALT, ALKPHOS, BILITOT, PROT, ALBUMIN in the last 72 hours.  Recent Labs  10/06/14 0544 10/07/14 0531  WBC 8.3 9.6  HGB 15.1 15.1  HCT 46.2 45.4  MCV 93.5 92.1  PLT 174 159    Recent Labs  10/04/14 1127 10/04/14 1916  TROPONINI 0.06* 0.06*   Invalid input(s): POCBNP No results for input(s): HGBA1C in the last 72 hours.   Radiology/Studies:  Dg Chest 2 View  10/06/2014   CLINICAL DATA:  Subsequent evaluation for generalized chest pain and shortness of breath for 5 days, history of COPD and hypertension  EXAM: CHEST  2 VIEW  COMPARISON:  10/04/2014  FINDINGS: Stable mild cardiac enlargement. Status post CABG previously. Vascular pattern normal. Mild opacity in the bilateral medial lung bases, slightly increased on the left. This is most consistent with atelectasis. Mild vascular congestion with no pulmonary edema.  IMPRESSION: Mild bibasilar airspace disease most likely representing atelectasis.  Electronically Signed   By: Skipper Cliche M.D.   On: 10/06/2014 19:42   Dg Chest 2 View  09/27/2014   CLINICAL DATA:  Cough and congestion for 4 weeks. Severe shortness of breath. History of COPD/ asthma, coronary artery disease and hypertension.  EXAM: CHEST  2 VIEW  COMPARISON:  07/02/2014 and 08/07/2013.  FINDINGS: There is moderate breathing artifact on the lateral view. The heart size and mediastinal contours are stable status post CABG. Patchy bibasilar opacities appear chronic, similar to the prior examination. There is no confluent airspace opacity, edema or  pleural effusion. The bones appear unchanged.  IMPRESSION: Stable postoperative chest status post CABG. Chronic bibasilar scarring.   Electronically Signed   By: Richardean Sale M.D.   On: 09/27/2014 17:35   US Renal  10/05/2014   CLINICAL DATA:  Initial evaluation acute kidney injury  EXAM: RENAL/URINARY TRACT ULTRASOUND COMPLETE  COMPARISON:  None.  FINDINGS: Right Kidney:  Length: 11.5 cm. Technically limited study with very limited detail of the right kidney. There does not appear to be hydronephrosis and the echogenicity appears within normal limits. Further detail unavailable.  Left Kidney:  Length: 14.0 cm. Detail similarly limited as on the contralateral side. There is a 34 x 39 x 41 mm cyst in the midpole and 33 x 31 x 34 mm cyst in the lower pole. These are similar to the prior study.  Bladder:  Appears normal for degree of bladder distention.  IMPRESSION: Technically limited study showing no evidence of hydronephrosis.   Electronically Signed   By: Skipper Cliche M.D.   On: 10/05/2014 11:58   Dg Chest Port 1 View  10/04/2014   CLINICAL DATA:  Chronic shortness of breath and productive cough. Lower extremity swelling. Initial encounter.  EXAM: PORTABLE CHEST - 1 VIEW  COMPARISON:  Chest radiograph performed 09/27/2014  FINDINGS: The lungs are well-aerated. Mild vascular congestion is noted. Mild left basilar opacity may reflect atelectasis or possibly minimal interstitial edema. A small left pleural effusion is seen. There is no evidence of pneumothorax.  The cardiomediastinal silhouette is mildly enlarged. The patient is status post median sternotomy, with evidence of prior CABG. No acute osseous abnormalities are seen.  IMPRESSION: Mild vascular congestion and mild cardiomegaly. Left basilar airspace opacity may reflect atelectasis or possibly minimal interstitial edema, given clinical concern. Small left pleural effusion seen.   Electronically Signed   By: Garald Balding M.D.   On: 10/04/2014  02:01   Matthews Wo Cm  10/02/2014   CLINICAL DATA:  COPD without exacerbation.  Persistent cough.  EXAM: CT PARANASAL SINUS LIMITED WITHOUT CONTRAST  TECHNIQUE: Non-contiguous multidetector CT images of the paranasal sinuses were obtained in a single plane without contrast.  COMPARISON:  None.  FINDINGS: The patient is status post functional endoscopic sinus surgery with right middle and inferior turbinectomy and medial maxillary antrectomy on the right. There is evidence of chronic sinusitis involving the right maxilla, with circumferential bony sclerosis. The opened right maxillary antrum is completely opacified, with lobulated tissue herniating into the right nasal cavity. There is extension of tissue towards the choana. When accounting for surgical change there is no visible bony destruction. No orbital or periantral soft tissue.  Opacified right sphenoid sinus without expansion or mural thickening.  IMPRESSION: 1. A polypoid mass fills the surgically opened right maxillary antrum. Given chronic sinusitis and surgical change focally present in the right maxillary sinus, suspect a recurrent lesion. Please correlate with operative/pathology history. The tissue  is readily visible through the right nostril. 2. Chronic appearing right sphenoid sinusitis.   Electronically Signed   By: Monte Fantasia M.D.   On: 10/02/2014 12:43     Assessment and Plan  47M with CAD s/p CABG in 1995, PCI in 2013, HTN, HLD, morbid obesity. COPD with chronic respiratory failure on home O2, chronic diastolic CHF, GIB 7829, nosebleeds, CKD admitted with CP/SOB, COPD exacerbation and newly recognized atrial fibrillation.  1. Chest pain/dyspnea possibly due to COPD exacerbation - minimal troponin elevation unclear significance in setting of COPD exacebration and renal insufficiency - prior notes suggest nuclear stress test once improved from breathing standpoint (which is not the case today) - may need dobutmaine  although this has potential to pop him back into AF - note recent CT sinus showed polypoid mass in right maxillary antrum, will defer further eval to IM  2. CAD s/p CABG 1995, DES in 2013 - continue ASA, statin, Bystolic - will discuss issue of Plavix with MD - last PCI in 2013, not being placed on anticoagulation for AF due to hx of GIB but tolerating ASA/Plavix without anemia  3. Newly recognized atrial fibrillation on admit EKG - tele now appears NSR/SB with wandering atrial pacemaker at times - CHADSVASC 5 - patient was not started on anticoag due to hx of GIB and nosebleeds - but see above re: ASA/Plavix - rates controlled (mid 40s-90s)  4. Chronic diastolic CHF, euvolemic - 2D Echo 10/06/14 - EF 45-50% by echo, no RWMA, grade 1 DD - lower EF than before (50-55%) but very limited quality echocardiogram - would not pursue cardiac MRI to further delineate EF at this time due to renal insufficiency - since rising BUN/Cr appears pre-renal, will ask nursing to hold metolazone and Lasix this AM until I can discuss plans with MD  5. AKI on CKD III with hypokalemia (K being repleted per IM) 6. COPD exacerbation with active wheezing, per IM, on home O2  Signed, Dayna Dunn PA-C  I have seen and examined the patient along with Melina Copa PA-C.  I have reviewed the chart, notes and new data.  I agree with PA/NP's note.  Key new complaints: no chest pain, dyspnea and wheezing at rest Key examination changes: severe bilateral wheezing Key new findings / data: creatinine slightly worse, relatively low BNP, no echo evidence of elevated filling pressure; telemetry shows atrial bigeminy with blocked bigeminal PACs (versus Mobitz I sinoatrial block)  PLAN: Delay Lexiscan Myoview until wheezing has improved. Hold diuretic. Threshold for coronary angiography is high due to risk of acute contrast induced renal failure.  Sanda Klein, MD, [place] [phone] 10/07/2014, 10:08 AM

## 2014-10-08 ENCOUNTER — Inpatient Hospital Stay (HOSPITAL_COMMUNITY): Payer: Medicare Other

## 2014-10-08 DIAGNOSIS — J441 Chronic obstructive pulmonary disease with (acute) exacerbation: Secondary | ICD-10-CM | POA: Diagnosis not present

## 2014-10-08 DIAGNOSIS — R079 Chest pain, unspecified: Secondary | ICD-10-CM

## 2014-10-08 DIAGNOSIS — N179 Acute kidney failure, unspecified: Secondary | ICD-10-CM

## 2014-10-08 LAB — BASIC METABOLIC PANEL
Anion gap: 12 (ref 5–15)
Anion gap: 15 (ref 5–15)
BUN: 69 mg/dL — AB (ref 6–23)
BUN: 68 mg/dL — AB (ref 6–23)
CO2: 32 mmol/L (ref 19–32)
CO2: 27 mmol/L (ref 19–32)
CREATININE: 2.47 mg/dL — AB (ref 0.50–1.35)
Calcium: 8.4 mg/dL (ref 8.4–10.5)
Calcium: 8.6 mg/dL (ref 8.4–10.5)
Chloride: 92 mmol/L — ABNORMAL LOW (ref 96–112)
Chloride: 94 mmol/L — ABNORMAL LOW (ref 96–112)
Creatinine, Ser: 2.35 mg/dL — ABNORMAL HIGH (ref 0.50–1.35)
GFR calc Af Amer: 28 mL/min — ABNORMAL LOW (ref >90)
GFR calc Af Amer: 26 mL/min — ABNORMAL LOW (ref >90)
GFR, EST NON AFRICAN AMERICAN: 24 mL/min — AB (ref >90)
GFR, EST NON AFRICAN AMERICAN: 23 mL/min — AB (ref >90)
GLUCOSE: 160 mg/dL — AB (ref 70–99)
Glucose, Bld: 207 mg/dL — ABNORMAL HIGH (ref 70–99)
POTASSIUM: 5.5 mmol/L — AB (ref 3.5–5.1)
Potassium: 4 mmol/L (ref 3.5–5.1)
Sodium: 136 mmol/L (ref 135–145)
Sodium: 136 mmol/L (ref 135–145)

## 2014-10-08 LAB — MAGNESIUM: Magnesium: 2.5 mg/dL (ref 1.5–2.5)

## 2014-10-08 MED ORDER — REGADENOSON 0.4 MG/5ML IV SOLN
INTRAVENOUS | Status: AC
Start: 2014-10-08 — End: 2014-10-08
  Filled 2014-10-08: qty 5

## 2014-10-08 MED ORDER — TECHNETIUM TC 99M SESTAMIBI GENERIC - CARDIOLITE
10.0000 | Freq: Once | INTRAVENOUS | Status: AC | PRN
  Administered 2014-10-08: 10 via INTRAVENOUS

## 2014-10-08 MED ORDER — TECHNETIUM TC 99M SESTAMIBI - CARDIOLITE
30.0000 | Freq: Once | INTRAVENOUS | Status: AC | PRN
  Administered 2014-10-08: 13:00:00 30 via INTRAVENOUS

## 2014-10-08 MED ORDER — REGADENOSON 0.4 MG/5ML IV SOLN
0.4000 mg | Freq: Once | INTRAVENOUS | Status: AC
  Filled 2014-10-08: qty 5

## 2014-10-08 MED ORDER — SODIUM CHLORIDE 0.9 % IJ SOLN
80.0000 mg | INTRAVENOUS | Status: AC
  Administered 2014-10-08: 80 mg via INTRAVENOUS

## 2014-10-08 MED ORDER — NAPHAZOLINE-PHENIRAMINE 0.025-0.3 % OP SOLN
1.0000 [drp] | Freq: Four times a day (QID) | OPHTHALMIC | Status: DC | PRN
  Administered 2014-10-08 – 2014-10-12 (×8): 1 [drp] via OPHTHALMIC
  Filled 2014-10-08 (×8): qty 5

## 2014-10-08 MED ORDER — REGADENOSON 0.4 MG/5ML IV SOLN
0.4000 mg | Freq: Once | INTRAVENOUS | Status: AC
  Administered 2014-10-08: 0.4 mg via INTRAVENOUS
  Filled 2014-10-08: qty 5

## 2014-10-08 NOTE — Progress Notes (Signed)
Pt back from stress test and Dr. Karleen Hampshire notified and asked if pt can resume diet?  MD instructed pt can resume diet.  Diet resumed.  Karie Kirks, Therapist, sports.

## 2014-10-08 NOTE — Progress Notes (Signed)
Quick Note:  Pt is still admitted as of 3.18.16 ______

## 2014-10-08 NOTE — Progress Notes (Signed)
Patient Name: [name] Date of Encounter: 10/08/2014   Principal Problem:   Chest pain Active Problems:   New onset atrial fibrillation   COPD exacerbation   COLD (chronic obstructive lung disease)   Atrial fibrillation, unspecified   CKD (chronic kidney disease), stage III   Acute kidney injury    SUBJECTIVE  Seen in nuc med.  No chest pain.  Wheezing/dyspnea slightly better.  For cardiolite today.  CURRENT MEDS . antiseptic oral rinse  7 mL Mouth Rinse BID  . arformoterol  15 mcg Nebulization BID  . aspirin EC  81 mg Oral Daily  . atorvastatin  80 mg Oral q1800  . budesonide (PULMICORT) nebulizer solution  0.25 mg Nebulization BID  . clopidogrel  75 mg Oral Daily  . enoxaparin (LOVENOX) injection  30 mg Subcutaneous Q24H  . hydrALAZINE  25 mg Oral TID  . Influenza vac split quadrivalent PF  0.5 mL Intramuscular Tomorrow-1000  . ipratropium-albuterol  3 mL Nebulization Q4H  . iron polysaccharides  150 mg Oral BID  . isosorbide mononitrate  30 mg Oral Daily  . [START ON 10/09/2014] levofloxacin (LEVAQUIN) IV  500 mg Intravenous Q48H  . methylPREDNISolone (SOLU-MEDROL) injection  60 mg Intravenous 3 times per day  . montelukast  10 mg Oral QHS  . nebivolol  10 mg Oral Daily  . pantoprazole  80 mg Oral BID  . pneumococcal 23 valent vaccine  0.5 mL Intramuscular Tomorrow-1000  . sodium chloride  3 mL Intravenous Q12H    OBJECTIVE  Filed Vitals:   10/08/14 0535 10/08/14 0804 10/08/14 1009 10/08/14 1057  BP: 124/69  152/57 135/45  Pulse: 95  100 111  Temp: 97.9 F (36.6 C)     TempSrc: Oral     Resp: 20     Height:      Weight: 280 lb 4.8 oz (127.143 kg)     SpO2: 95% 95%      Intake/Output Summary (Last 24 hours) at 10/08/14 1100 Last data filed at 10/08/14 0943  Gross per 24 hour  Intake    840 ml  Output   1550 ml  Net   -710 ml   Filed Weights   10/06/14 0508 10/07/14 0534 10/08/14 0535  Weight: 276 lb (125.193 kg) 278 lb 6.4 oz (126.281 kg) 280  lb 4.8 oz (127.143 kg)    PHYSICAL EXAM  General: Pleasant, NAD. Neuro: Alert and oriented X 3. Moves all extremities spontaneously. Psych: Normal affect. HEENT:  Normal  Neck: Supple without bruits.  Obese - difficult to assess jvp. Lungs:  Resp regular and unlabored, scattered rhonchi throughout with scattered/faint insp/exp wheezing. Heart: Irreg no s3, s4, or murmurs. Abdomen: Soft, non-tender, non-distended, BS + x 4.  Extremities: No clubbing, cyanosis or edema. DP/PT/Radials 2+ and equal bilaterally.  Accessory Clinical Findings  CBC  Recent Labs  10/06/14 0544 10/07/14 0531  WBC 8.3 9.6  HGB 15.1 15.1  HCT 46.2 45.4  MCV 93.5 92.1  PLT 174 119   Basic Metabolic Panel  Recent Labs  10/07/14 0531 10/08/14 0305  NA 137 136  K 3.1* 5.5*  CL 90* 92*  CO2 34* 32  GLUCOSE 168* 160*  BUN 66* 69*  CREATININE 2.65* 2.35*  CALCIUM 8.7 8.4  MG 2.2 2.5   TELE  Seen in nuc med - in sinus.  ASSESSMENT AND PLAN  1.  Chest pain/elevated troponin/CAD:  No further chest pain.  For cardiolite today.  Cont asa, plavix, statin, nitrate,  bb.  2.  Afib:  In sinus in nuc med this morning.  He is not on Latimer 2/2 prior h/o GIB and nosebleeds.  Cont asa/plavix.  3.  Chronic diastolic chf:  EF 28-31% by echo 3/20.  Diuretics held yesterday 2/2 rising creat.  Creat improved this AM.  Wt stable.  + 4 L yesterday.  Will likely have to resume lasix w/in next 24 hrs.  4.  Acute on chronic stage IV renal failure:  Improved after holding diuretics yesterday.  Follow with resumption.  Signed, Murray Hodgkins NP   I have seen and examined the patient along with Murray Hodgkins NP.  I have reviewed the chart, notes and new data.  I agree with NP's note.  Key new complaints: wheezing improved Key examination changes: no overt CHF; developed more wheezing with Lexiscan, quickly resolved Key new findings / data: on my review nuclear perfusion images are normal, definitely no large  reversible defect that would warrant coronary angio and risk of renal failure. Creatinine a little better.  PLAN: Dyspnea appears to be mostly a pulmonary phenomenon No plan for coronary angio Baseline creatinine about 1.7. Will need to resume diuretics as he approaches that value.   Sanda Klein, MD, [place] [phone] 10/08/2014, 1:45 PM

## 2014-10-08 NOTE — Progress Notes (Signed)
[facility] PROGRESS NOTE  DEVUN ANNA ONG:[id number] DOB: Mar 17, 1931 DOA: 10/04/2014 PCP:  Melinda Crutch, MD Interim summary: 81 year old male admitted for left sided pleuritc chest pain and sob. He was admitted for  COPD exacerbation and evaluation of ACS. His troponins came back elevated and cardiology consulted.    Assessment/Plan: 1. Pleuritic chest pain:  Comes in intermittently. EKG does not show any significant ischemic changes.  Has minimally elevated troponins. Echocardiogram done , showed left ventricular systolic function slightly reduced to 45% ,but no regional wall abnormalities. There was grade 1 diastolic dysfunction. He underwent myoview stress test and it was normal study with no chest pain or ischemia or infarction.  Pain control as needed.   Copd exacerbation: he was initially started on 40 mg of IV solumedrol daily, increased the dose to 60 mg TID . Resume bronchodilators and Change the symbicort to brovana and pulmicort neb treatments. . Stopped doxycycline and added levaquin . His breathing is much improved today. Resume the same regimen for 24 more hours.   3. Chronic atrial fibrillation: Rate controlled. Not a candidate for anticoagulation secondary to GI bleed.  Currently on aspirin and plavix.    4. Chronic diastolic heart failure: - does not appear to be fluid overloaded. CXR shows basilar atelectasis, incentive spirometry ordered.  his echo this admission demonstrated a slight declinne in LVEF. Stress test normal.  Stopped lasix and metolazone for his worsening renal parameters.  - further management as per cardiology.    Acute on CKD: - Unclear etiology,  Cardiology has stopped lasix and metolazone. Repeat renal parameters show slight improvement. If his renal function does not improve even on stopping the diuretics,  please call renal in am.  UA is negative for infection.  US renal does not show any hydronephrosis.    HYpokalemia: replete as needed.  Repeat in am.  Mag level normal.    Hyperkalemia: hemolysed sample, repeat K is normal.   Hypertension: Well controlled.  Resume home meds.     Code Status: full code.  Family Communication: none at bedside today.  Disposition Plan: home with home PT when his breathing improves.    Consultants:  cardiology  Procedures:  Echocardiogram.   Antibiotics:  Levaquin.   HPI/Subjective: In the chair, denies any chest pain or sob. Reports he is exhausted from the test.   Objective: Filed Vitals:   10/08/14 1358  BP: 129/68  Pulse: 113  Temp: 98 F (36.7 C)  Resp: 18    Intake/Output Summary (Last 24 hours) at 10/08/14 1654 Last data filed at 10/08/14 1323  Gross per 24 hour  Intake    840 ml  Output   1551 ml  Net   -711 ml   Filed Weights   10/06/14 0508 10/07/14 0534 10/08/14 0535  Weight: 125.193 kg (276 lb) 126.281 kg (278 lb 6.4 oz) 127.143 kg (280 lb 4.8 oz)    Exam:   General:  ALERT Afebrile not in any distress.   Cardiovascular: s1s2. Slightly tachycardic.   Respiratory: scattered wheezing bilateral, air entry fair.   Abdomen: soft non tender non distended bowel sounds heard.   Musculoskeletal: trace pedal edema.   Data Reviewed: Basic Metabolic Panel:  Recent Labs Lab 10/05/14 0400 10/06/14 0544 10/07/14 0531 10/08/14 0305 10/08/14 1510  NA 139 137 137 136 136  K 3.8 3.4* 3.1* 5.5* 4.0  CL 95* 90* 90* 92* 94*  CO2 33* 35* 34* 32 27  GLUCOSE 129* 161* 168* 160*  207*  BUN 53* 56* 66* 69* 68*  CREATININE 2.56* 2.45* 2.65* 2.35* 2.47*  CALCIUM 8.8 8.4 8.7 8.4 8.6  MG  --   --  2.2 2.5  --    Liver Function Tests:  Recent Labs Lab 10/04/14 0655  AST 58*  ALT 23  ALKPHOS 74  BILITOT 2.2*  PROT 6.6  ALBUMIN 3.5   No results for input(s): LIPASE, AMYLASE in the last 168 hours. No results for input(s): AMMONIA in the last 168 hours. CBC:  Recent Labs Lab 10/04/14 0040 10/04/14 0655 10/05/14 0400 10/06/14 0544  10/07/14 0531  WBC 7.4 7.8 7.0 8.3 9.6  NEUTROABS 4.0 4.2  --   --   --   HGB 15.0 15.7 14.8 15.1 15.1  HCT 46.2 47.7 45.4 46.2 45.4  MCV 93.9 93.5 94.0 93.5 92.1  PLT 189 191 183 174 159   Cardiac Enzymes:  Recent Labs Lab 10/04/14 0655 10/04/14 1127 10/04/14 1916  TROPONINI 0.05* 0.06* 0.06*   BNP (last 3 results)  Recent Labs  10/04/14 0040  BNP 138.5*    ProBNP (last 3 results) No results for input(s): PROBNP in the last 8760 hours.  CBG:  Recent Labs Lab 10/04/14 0418 10/04/14 1119  GLUCAP 113* 126*    No results found for this or any previous visit (from the past 240 hour(s)).   Studies: Dg Chest 2 View  10/06/2014   CLINICAL DATA:  Subsequent evaluation for generalized chest pain and shortness of breath for 5 days, history of COPD and hypertension  EXAM: CHEST  2 VIEW  COMPARISON:  10/04/2014  FINDINGS: Stable mild cardiac enlargement. Status post CABG previously. Vascular pattern normal. Mild opacity in the bilateral medial lung bases, slightly increased on the left. This is most consistent with atelectasis. Mild vascular congestion with no pulmonary edema.  IMPRESSION: Mild bibasilar airspace disease most likely representing atelectasis.   Electronically Signed   By: Skipper Cliche M.D.   On: 10/06/2014 19:42   Nm Myocar Multi W/spect W/wall Motion / Ef  10/08/2014   CLINICAL DATA:  Chest pain  EXAM: Lexiscan Myovue  TECHNIQUE: The patient received IV Lexiscan .4mg  over 15 seconds. 33.0 mCi of Technetium 36m Sestamibi injected at 30 seconds. Quantitative SPECT images were obtained in the vertical, horizontal and short axis planes after a 45 minute delay. Rest images were obtained with similar planes and delay using 10.2 mCi of Technetium 57m Sestamibi.  FINDINGS: ECG: : Baseline electrocardiogram shows sinus rhythm, first-degree AV block, right axis deviation, and anterior infarct. Resting heart rate 100 and blood pressure 152/57. Following infusion heart rate  100 and blood pressure 129/66.  Symptoms:  No chest pain.  RAW Data:  Adequate image acquisition.  Quantitiative Gated SPECT EF: Gated ejection fraction 58% and normal wall motion. TID- 1.46. End-diastolic volume 99 mL. End systolic volume 41 mL.  Perfusion Images: Images were obtained in the short axis and vertical and horizontal long axes. There was no evidence of ischemia or infarction in any vascular territory.  IMPRESSION: Normal stress nuclear study with no chest pain, no electrocardiographic changes, and no ischemia or infarction. Gated ejection fraction 58% and normal wall motion.  Kirk Ruths   Electronically Signed   By: Kirk Ruths   On: 10/08/2014 16:41    Scheduled Meds: . antiseptic oral rinse  7 mL Mouth Rinse BID  . arformoterol  15 mcg Nebulization BID  . aspirin EC  81 mg Oral Daily  . atorvastatin  80  mg Oral q1800  . budesonide (PULMICORT) nebulizer solution  0.25 mg Nebulization BID  . clopidogrel  75 mg Oral Daily  . enoxaparin (LOVENOX) injection  30 mg Subcutaneous Q24H  . hydrALAZINE  25 mg Oral TID  . ipratropium-albuterol  3 mL Nebulization Q4H  . iron polysaccharides  150 mg Oral BID  . isosorbide mononitrate  30 mg Oral Daily  . [START ON 10/09/2014] levofloxacin (LEVAQUIN) IV  500 mg Intravenous Q48H  . methylPREDNISolone (SOLU-MEDROL) injection  60 mg Intravenous 3 times per day  . montelukast  10 mg Oral QHS  . nebivolol  10 mg Oral Daily  . pantoprazole  80 mg Oral BID  . sodium chloride  3 mL Intravenous Q12H   Continuous Infusions:   Principal Problem:   Chest pain Active Problems:   CKD (chronic kidney disease), stage III   New onset atrial fibrillation   COPD exacerbation   COLD (chronic obstructive lung disease)   Acute kidney injury   Atrial fibrillation, unspecified    Time spent: 25 MINUTES    Gloyd Happ  [facility] Pager [phone] If 7PM-7AM, please contact night-coverage at [email or web address], password [facility] 10/08/2014,  4:54 PM  LOS: 4 days

## 2014-10-09 LAB — BASIC METABOLIC PANEL
ANION GAP: 12 (ref 5–15)
BUN: 62 mg/dL — ABNORMAL HIGH (ref 6–23)
CHLORIDE: 94 mmol/L — AB (ref 96–112)
CO2: 30 mmol/L (ref 19–32)
Calcium: 8.6 mg/dL (ref 8.4–10.5)
Creatinine, Ser: 2.44 mg/dL — ABNORMAL HIGH (ref 0.50–1.35)
GFR calc Af Amer: 27 mL/min — ABNORMAL LOW (ref >90)
GFR calc non Af Amer: 23 mL/min — ABNORMAL LOW (ref >90)
Glucose, Bld: 147 mg/dL — ABNORMAL HIGH (ref 70–99)
Potassium: 3.8 mmol/L (ref 3.5–5.1)
Sodium: 136 mmol/L (ref 135–145)

## 2014-10-09 MED ORDER — ENOXAPARIN SODIUM 40 MG/0.4ML ~~LOC~~ SOLN
40.0000 mg | SUBCUTANEOUS | Status: DC
  Administered 2014-10-09 – 2014-10-13 (×5): 40 mg via SUBCUTANEOUS
  Filled 2014-10-09 (×5): qty 0.4

## 2014-10-09 MED ORDER — BUDESONIDE 0.5 MG/2ML IN SUSP
0.5000 mg | Freq: Two times a day (BID) | RESPIRATORY_TRACT | Status: DC
  Administered 2014-10-09 – 2014-10-13 (×7): 0.5 mg via RESPIRATORY_TRACT
  Filled 2014-10-09 (×11): qty 2

## 2014-10-09 MED ORDER — IPRATROPIUM-ALBUTEROL 0.5-2.5 (3) MG/3ML IN SOLN
3.0000 mL | Freq: Four times a day (QID) | RESPIRATORY_TRACT | Status: DC
  Administered 2014-10-10 – 2014-10-12 (×11): 3 mL via RESPIRATORY_TRACT
  Filled 2014-10-09 (×11): qty 3

## 2014-10-09 MED ORDER — FLUTICASONE PROPIONATE 50 MCG/ACT NA SUSP
2.0000 | Freq: Every day | NASAL | Status: DC
  Administered 2014-10-09 – 2014-10-13 (×5): 2 via NASAL
  Filled 2014-10-09: qty 16

## 2014-10-09 MED ORDER — GI COCKTAIL ~~LOC~~
30.0000 mL | Freq: Once | ORAL | Status: AC
  Administered 2014-10-09: 30 mL via ORAL
  Filled 2014-10-09: qty 30

## 2014-10-09 MED ORDER — METHYLPREDNISOLONE SODIUM SUCC 40 MG IJ SOLR
40.0000 mg | Freq: Two times a day (BID) | INTRAMUSCULAR | Status: DC
  Administered 2014-10-09 – 2014-10-11 (×4): 40 mg via INTRAVENOUS
  Filled 2014-10-09 (×6): qty 1

## 2014-10-09 NOTE — Progress Notes (Signed)
Physical Therapy Treatment Patient Details Name: [name] MRN: [id number] DOB: 1930/11/18 Today's Date: 10/09/2014    History of Present Illness Patient is an 81 yo male admitted 10/04/14 with SOB and chest pain. Patient with COPD exacerbation and Afib.  PMH:  CAD, CABG, CHF, HTN, COPD, CKD, syncope, HOH, OSA, arthritis, on home O2 at 2 l/min    PT Comments    Progressing slowly.  Wants to go directly home.  Will need some extra assist at home for a short period until he is safe to be home alone for extended periods.  Follow Up Recommendations  Home health PT;Supervision - Intermittent     Equipment Recommendations       Recommendations for Other Services       Precautions / Restrictions Precautions Precautions: Fall Precaution Comments: Patient with h/o syncopal episodes with falls - 4 in last 6 months.  Most recent fall was 1 week ago.    Mobility  Bed Mobility               General bed mobility comments: pt up in the chair  Transfers Overall transfer level: Needs assistance   Transfers: Sit to/from Stand Sit to Stand: Supervision         General transfer comment: Verbal cues for hand placement.  Patient scoots to edge of chair independently.  Assist to rise to standing and with balance initially.  Stood for several seconds prior to ambulation.  Ambulation/Gait Ambulation/Gait assistance: Min guard;Supervision Ambulation Distance (Feet): 70 Feet (x2 with RW) Assistive device: Rolling walker (2 wheeled) Gait Pattern/deviations: Step-through pattern Gait velocity: Decreased   General Gait Details: mildly guarded and weak-kneed initially, but improved over 2nd trial of ambulation.   Stairs            Wheelchair Mobility    Modified Rankin (Stroke Patients Only)       Balance Overall balance assessment: Needs assistance   Sitting balance-Leahy Scale: Good     Standing balance support: No upper extremity supported Standing  balance-Leahy Scale: Fair                      Cognition Arousal/Alertness: Awake/alert Behavior During Therapy: WFL for tasks assessed/performed Overall Cognitive Status: Within Functional Limits for tasks assessed                      Exercises General Exercises - Lower Extremity Long Arc Quad: PROM;Strengthening;10 reps;Seated;Both Hip Flexion/Marching: AROM;Strengthening;Both;10 reps;Seated Toe Raises: AROM;Both;10 reps;Seated Heel Raises: AROM;Both;10 reps;Seated Other Exercises Other Exercises: seated bicep/tricep presses x10 reps    General Comments        Pertinent Vitals/Pain Pain Assessment: Faces Faces Pain Scale: No hurt    Home Living                      Prior Function            PT Goals (current goals can now be found in the care plan section) Acute Rehab PT Goals Patient Stated Goal: To breathe easier PT Goal Formulation: With patient Time For Goal Achievement: 10/13/14 Potential to Achieve Goals: Good Progress towards PT goals: Progressing toward goals    Frequency  Min 3X/week    PT Plan Current plan remains appropriate;Equipment recommendations need to be updated    Co-evaluation             End of Session Equipment Utilized During Treatment: Gait belt;Oxygen Activity Tolerance: Patient  limited by fatigue Patient left: in chair;with call bell/phone within reach     Time: 1031-1101 PT Time Calculation (min) (ACUTE ONLY): 30 min  Charges:  $Gait Training: 8-22 mins $Therapeutic Exercise: 8-22 mins                    G Codes:      Kailly Richoux, Tessie Fass 10/09/2014, 2:51 PM 10/09/2014  Donnella Sham, PT [phone]  (pager)

## 2014-10-09 NOTE — Progress Notes (Signed)
D/c off floor via w/c to awaiting transport to home.  Hawk Mones,RN.

## 2014-10-09 NOTE — Progress Notes (Signed)
Patient Name: [name] Date of Encounter: 10/09/2014  Primary Cardiologist Dr. Acie Fredrickson   Principal Problem:   Chest pain Active Problems:   CKD (chronic kidney disease), stage III   New onset atrial fibrillation   COPD exacerbation   COLD (chronic obstructive lung disease)   Acute kidney injury   Atrial fibrillation, unspecified    SUBJECTIVE  States his "croup" returned. Wheezing this morning. No CP. No significant SOB on 3L [place]. States he is on 2L PRN O2 at home, esp at night.   CURRENT MEDS . antiseptic oral rinse  7 mL Mouth Rinse BID  . arformoterol  15 mcg Nebulization BID  . aspirin EC  81 mg Oral Daily  . atorvastatin  80 mg Oral q1800  . budesonide (PULMICORT) nebulizer solution  0.25 mg Nebulization BID  . clopidogrel  75 mg Oral Daily  . enoxaparin (LOVENOX) injection  40 mg Subcutaneous Q24H  . hydrALAZINE  25 mg Oral TID  . ipratropium-albuterol  3 mL Nebulization Q4H  . iron polysaccharides  150 mg Oral BID  . isosorbide mononitrate  30 mg Oral Daily  . levofloxacin (LEVAQUIN) IV  500 mg Intravenous Q48H  . methylPREDNISolone (SOLU-MEDROL) injection  60 mg Intravenous 3 times per day  . montelukast  10 mg Oral QHS  . nebivolol  10 mg Oral Daily  . pantoprazole  80 mg Oral BID  . sodium chloride  3 mL Intravenous Q12H    OBJECTIVE  Filed Vitals:   10/09/14 0533 10/09/14 0801 10/09/14 0932 10/09/14 1002  BP: 132/81   109/56  Pulse: 102     Temp: 97.8 F (36.6 C)     TempSrc: Oral     Resp: 18     Height:      Weight: 279 lb 12.8 oz (126.916 kg)     SpO2: 94% 95% 96%     Intake/Output Summary (Last 24 hours) at 10/09/14 1009 Last data filed at 10/09/14 0837  Gross per 24 hour  Intake   1737 ml  Output   1151 ml  Net    586 ml   Filed Weights   10/07/14 0534 10/08/14 0535 10/09/14 0533  Weight: 278 lb 6.4 oz (126.281 kg) 280 lb 4.8 oz (127.143 kg) 279 lb 12.8 oz (126.916 kg)    PHYSICAL EXAM  General: Pleasant, NAD. Neuro: Alert  and oriented X 3. Moves all extremities spontaneously. Psych: Normal affect. HEENT:  Normal  Neck: Supple without bruits or JVD. Lungs:  Resp regular and unlabored. Significant bilateral rhonchi and wheezing. Heart: RRR no s3, s4, or murmurs. Abdomen: Soft, non-tender, non-distended, BS + x 4.  Extremities: No clubbing, cyanosis or edema. DP/PT/Radials 2+ and equal bilaterally.  Accessory Clinical Findings  CBC  Recent Labs  10/07/14 0531  WBC 9.6  HGB 15.1  HCT 45.4  MCV 92.1  PLT 353   Basic Metabolic Panel  Recent Labs  10/07/14 0531 10/08/14 0305 10/08/14 1510 10/09/14 0332  NA 137 136 136 136  K 3.1* 5.5* 4.0 3.8  CL 90* 92* 94* 94*  CO2 34* 32 27 30  GLUCOSE 168* 160* 207* 147*  BUN 66* 69* 68* 62*  CREATININE 2.65* 2.35* 2.47* 2.44*  CALCIUM 8.7 8.4 8.6 8.6  MG 2.2 2.5  --   --     TELE NSR with HR 90s with frequent PACs    ECG  No new EKG  Echocardiogram 10/06/2014  LV EF: 45% -  50%  -------------------------------------------------------------------  Indications:   Chest pain 786.51.  ------------------------------------------------------------------- History:  PMH:  Atrial fibrillation. Coronary artery disease. Congestive heart failure. Chronic obstructive pulmonary disease. Risk factors: Hypertension. Diabetes mellitus. Obese.  ------------------------------------------------------------------- Study Conclusions  - Left ventricle: The cavity size was normal. There was moderate concentric hypertrophy. Systolic function was mildly reduced. The estimated ejection fraction was in the range of 45% to 50%. Wall motion was normal; there were no regional wall motion abnormalities. Doppler parameters are consistent with abnormal left ventricular relaxation (grade 1 diastolic dysfunction). - Left atrium: The atrium was mildly dilated.  Impressions:  - This is a very limited quality echocardiogram. Regional wall motion  can&'t be assessed. Estimated LVEF is grossly 45-50%. Consider non-contrast cardiac MRI for LVEF evaluation and chamber quantification.    Radiology/Studies  Dg Chest 2 View  10/06/2014   CLINICAL DATA:  Subsequent evaluation for generalized chest pain and shortness of breath for 5 days, history of COPD and hypertension  EXAM: CHEST  2 VIEW  COMPARISON:  10/04/2014  FINDINGS: Stable mild cardiac enlargement. Status post CABG previously. Vascular pattern normal. Mild opacity in the bilateral medial lung bases, slightly increased on the left. This is most consistent with atelectasis. Mild vascular congestion with no pulmonary edema.  IMPRESSION: Mild bibasilar airspace disease most likely representing atelectasis.   Electronically Signed   By: Skipper Cliche M.D.   On: 10/06/2014 19:42   Dg Chest 2 View  09/27/2014   CLINICAL DATA:  Cough and congestion for 4 weeks. Severe shortness of breath. History of COPD/ asthma, coronary artery disease and hypertension.  EXAM: CHEST  2 VIEW  COMPARISON:  07/02/2014 and 08/07/2013.  FINDINGS: There is moderate breathing artifact on the lateral view. The heart size and mediastinal contours are stable status post CABG. Patchy bibasilar opacities appear chronic, similar to the prior examination. There is no confluent airspace opacity, edema or pleural effusion. The bones appear unchanged.  IMPRESSION: Stable postoperative chest status post CABG. Chronic bibasilar scarring.   Electronically Signed   By: Richardean Sale M.D.   On: 09/27/2014 17:35   US Renal  10/05/2014   CLINICAL DATA:  Initial evaluation acute kidney injury  EXAM: RENAL/URINARY TRACT ULTRASOUND COMPLETE  COMPARISON:  None.  FINDINGS: Right Kidney:  Length: 11.5 cm. Technically limited study with very limited detail of the right kidney. There does not appear to be hydronephrosis and the echogenicity appears within normal limits. Further detail unavailable.  Left Kidney:  Length: 14.0 cm. Detail  similarly limited as on the contralateral side. There is a 34 x 39 x 41 mm cyst in the midpole and 33 x 31 x 34 mm cyst in the lower pole. These are similar to the prior study.  Bladder:  Appears normal for degree of bladder distention.  IMPRESSION: Technically limited study showing no evidence of hydronephrosis.   Electronically Signed   By: Skipper Cliche M.D.   On: 10/05/2014 11:58   Nm Myocar Multi W/spect W/wall Motion / Ef  10/08/2014   CLINICAL DATA:  Chest pain  EXAM: Lexiscan Myovue  TECHNIQUE: The patient received IV Lexiscan .4mg  over 15 seconds. 33.0 mCi of Technetium 25m Sestamibi injected at 30 seconds. Quantitative SPECT images were obtained in the vertical, horizontal and short axis planes after a 45 minute delay. Rest images were obtained with similar planes and delay using 10.2 mCi of Technetium 36m Sestamibi.  FINDINGS: ECG: : Baseline electrocardiogram shows sinus rhythm, first-degree AV block, right axis deviation, and anterior infarct. Resting heart  rate 100 and blood pressure 152/57. Following infusion heart rate 100 and blood pressure 129/66.  Symptoms:  No chest pain.  RAW Data:  Adequate image acquisition.  Quantitiative Gated SPECT EF: Gated ejection fraction 58% and normal wall motion. TID- 1.46. End-diastolic volume 99 mL. End systolic volume 41 mL.  Perfusion Images: Images were obtained in the short axis and vertical and horizontal long axes. There was no evidence of ischemia or infarction in any vascular territory.  IMPRESSION: Normal stress nuclear study with no chest pain, no electrocardiographic changes, and no ischemia or infarction. Gated ejection fraction 58% and normal wall motion.  Kirk Ruths   Electronically Signed   By: Kirk Ruths   On: 10/08/2014 16:41   Dg Chest Port 1 View  10/04/2014   CLINICAL DATA:  Chronic shortness of breath and productive cough. Lower extremity swelling. Initial encounter.  EXAM: PORTABLE CHEST - 1 VIEW  COMPARISON:  Chest radiograph  performed 09/27/2014  FINDINGS: The lungs are well-aerated. Mild vascular congestion is noted. Mild left basilar opacity may reflect atelectasis or possibly minimal interstitial edema. A small left pleural effusion is seen. There is no evidence of pneumothorax.  The cardiomediastinal silhouette is mildly enlarged. The patient is status post median sternotomy, with evidence of prior CABG. No acute osseous abnormalities are seen.  IMPRESSION: Mild vascular congestion and mild cardiomegaly. Left basilar airspace opacity may reflect atelectasis or possibly minimal interstitial edema, given clinical concern. Small left pleural effusion seen.   Electronically Signed   By: Garald Balding M.D.   On: 10/04/2014 02:01   [place] Wo Cm  10/02/2014   CLINICAL DATA:  COPD without exacerbation.  Persistent cough.  EXAM: CT PARANASAL SINUS LIMITED WITHOUT CONTRAST  TECHNIQUE: Non-contiguous multidetector CT images of the paranasal sinuses were obtained in a single plane without contrast.  COMPARISON:  None.  FINDINGS: The patient is status post functional endoscopic sinus surgery with right middle and inferior turbinectomy and medial maxillary antrectomy on the right. There is evidence of chronic sinusitis involving the right maxilla, with circumferential bony sclerosis. The opened right maxillary antrum is completely opacified, with lobulated tissue herniating into the right nasal cavity. There is extension of tissue towards the choana. When accounting for surgical change there is no visible bony destruction. No orbital or periantral soft tissue.  Opacified right sphenoid sinus without expansion or mural thickening.  IMPRESSION: 1. A polypoid mass fills the surgically opened right maxillary antrum. Given chronic sinusitis and surgical change focally present in the right maxillary sinus, suspect a recurrent lesion. Please correlate with operative/pathology history. The tissue is readily visible through the right  nostril. 2. Chronic appearing right sphenoid sinusitis.   Electronically Signed   By: Monte Fantasia M.D.   On: 10/02/2014 12:43    ASSESSMENT AND PLAN  7M with CAD s/p CABG in 1995, PCI in 2013, HTN, HLD, morbid obesity. COPD with chronic respiratory failure on home O2, chronic diastolic CHF, GIB 0175, nosebleeds, CKD admitted with CP/SOB, COPD exacerbation and newly recognized atrial fibrillation.  1. Chest pain likely due to COPD exacerbation  - myoview 10/08/2014 negative for infarct or ischemia  - continue to hold diuretic given acute on chronic renal insufficiency. Continue ASA, plavix, bystolic and nitrate/hydralazine.   - still has significant wheezing on rhonchi on exam, however no rale. No LE edema. Can resume PO lasix and metolazone in 1-2 days  2. COPD exacerbation: per primary team  3. Newly diagnosed A-fib - converted  -  CHADSVASC 5 - patient was not started on anticoag due to hx of GIB and nosebleeds   4. CAD s/p CABG 1995, DES in 2013  - continue ASA, plavix, statin, Bystolic  5. Chronic diastolic HF  - 2D Echo 8/00/34 - EF 45-50% by echo, no RWMA, grade 1 DD - lower EF than before (50-55%) but very limited quality echocardiogram  - on bystolic and nitrate/hydralazine  6. Acute on stage IV CKD   Signed, Woodward Ku Pager: [phone]  I have seen and examined the patient along with Almyra Deforest PA-C.  I have reviewed the chart, notes and new data.  I agree with PA's note.  PLAN: No new recommendations. Not ready to resume diuretic. Please reconsult as needed  Sanda Klein, MD, [facility] [place] [phone] 10/09/2014, 1:07 PM

## 2014-10-09 NOTE — Progress Notes (Signed)
[facility] PROGRESS NOTE  [name] DUK:[id number] DOB: 11-14-1930 DOA: 10/04/2014 PCP:  Melinda Crutch, MD  Brief Summary 81 year old male admitted for left sided pleuritc chest pain and sob. He was admitted for COPD exacerbation and evaluation of ACS. His troponins came back elevated and cardiology was consulted.    Assessment/Plan  Pleuritic chest pain, may be related to COPD exacerbation -  EKG did not show any significant ischemic changes -  Minimally elevated troponins -  ECHO:  left ventricular systolic function slightly reduced to 45%, but no regional wall abnormalities. There was grade 1 diastolic dysfunction.  -  Myoview stress test:  normal study with no chest pain or ischemia or infarction.  -  Pain control as needed.   Acute on chronic hypoxic respiratory failure secondary to Copd exacerbation, still very wheezy and breathless but gradually improving -  Change to solumedrol 40mg  IV BID -  Consider changing to prednisone 60mg  possibly BID on 3/24 -  Continue bronchodilators  -  Continue brovana and pulmicort neb treatments -  Continue levaquin, okay to convert to PO, last dose this evening -  Pulmonology consult in AM - follows with Dr. 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  -  Currently on aspirin and plavix.   Chronic diastolic heart failure with possible mild decrease in LV, but ECHO was reportedly not of good quality: - does not appear to be fluid overloaded.  -  CXR shows basilar atelectasis, incentive spirometry ordered -  Continue  BB, imdur -  lasix and metolazone held due to AKI - further management as per cardiology.   Acute on CK stage III (cr 1.7 baseline): - Unclear etiology -  Continue to hold lasix and metolazone and may resume in 1-2 days per cardiology -  Continue to trend creatinine BUN and creatinine improving slightly.  . -  US renal does not show any hydronephrosis.  -  Consider nephrology consultation if not improving -  Renally dose medications and minimize nephrotoxins  Hyperkalemia, resolved.   Mag level normal.   Hypertension, well controlled -  Resume home meds.   Diet:  Healthy heart Access:  PIV IVF:  off Proph:  lovenox  Code Status: full Family Communication: patient alone Disposition Plan: pending improvement in breathing    Consultants:  cardiology  Procedures:  CXR  NM stress  ECHO  Antibiotics:  Doxyc 3/18 > 3/21  Levofloxacin 3/21  HPI/Subjective:  Still coughing and SOB at rest.    Objective: Filed Vitals:   10/09/14 0932 10/09/14 1002 10/09/14 1146 10/09/14 1300  BP:  109/56  131/77  Pulse:    100  Temp:    98 F (36.7 C)  TempSrc:    Oral  Resp:    18  Height:      Weight:      SpO2: 96%  96% 96%    Intake/Output Summary (Last 24 hours) at 10/09/14 1447 Last data filed at 10/09/14 1405  Gross per 24 hour  Intake   1377 ml  Output  1275 ml  Net    102 ml   Filed Weights   10/07/14 0534 10/08/14 0535 10/09/14 0533  Weight: 126.281 kg (278 lb 6.4 oz) 127.143 kg (280 lb 4.8 oz) 126.916 kg (279 lb 12.8 oz)    Exam:   General:  Obese M, breathless but able to speak in full sentences with breaks, obvious wheezing and SCM retractions  HEENT:  NCAT, MMM  Cardiovascular:  RRR, nl S1, S2 no mrg, 2+ pulses, warm extremities  Respiratory:  Diminished bilateral BS with full expiratory wheeze, no focal rales or rhonchi  Abdomen:   NABS, soft, NT/ND  MSK:   Normal tone and bulk, no LEE  Neuro:  Grossly intact  Data Reviewed: Basic  Metabolic Panel:  Recent Labs Lab 10/06/14 0544 10/07/14 0531 10/08/14 0305 10/08/14 1510 10/09/14 0332  NA 137 137 136 136 136  K 3.4* 3.1* 5.5* 4.0 3.8  CL 90* 90* 92* 94* 94*  CO2 35* 34* 32 27 30  GLUCOSE 161* 168* 160* 207* 147*  BUN 56* 66* 69* 68* 62*  CREATININE 2.45* 2.65* 2.35* 2.47* 2.44*  CALCIUM 8.4 8.7 8.4 8.6 8.6  MG  --  2.2 2.5  --   --    Liver Function Tests:  Recent Labs Lab 10/04/14 0655  AST 58*  ALT 23  ALKPHOS 74  BILITOT 2.2*  PROT 6.6  ALBUMIN 3.5   No results for input(s): LIPASE, AMYLASE in the last 168 hours. No results for input(s): AMMONIA in the last 168 hours. CBC:  Recent Labs Lab 10/04/14 0040 10/04/14 0655 10/05/14 0400 10/06/14 0544 10/07/14 0531  WBC 7.4 7.8 7.0 8.3 9.6  NEUTROABS 4.0 4.2  --   --   --   HGB 15.0 15.7 14.8 15.1 15.1  HCT 46.2 47.7 45.4 46.2 45.4  MCV 93.9 93.5 94.0 93.5 92.1  PLT 189 191 183 174 159   Cardiac Enzymes:  Recent Labs Lab 10/04/14 0655 10/04/14 1127 10/04/14 1916  TROPONINI 0.05* 0.06* 0.06*   BNP (last 3 results)  Recent Labs  10/04/14 0040  BNP 138.5*    ProBNP (last 3 results) No results for input(s): PROBNP in the last 8760 hours.  CBG:  Recent Labs Lab 10/04/14 0418 10/04/14 1119  GLUCAP 113* 126*    No results found for this or any previous visit (from the past 240 hour(s)).   Studies: Nm Myocar Multi W/spect W/wall Motion / Ef  10/08/2014   CLINICAL DATA:  Chest pain  EXAM: Lexiscan Myovue  TECHNIQUE: The patient received IV Lexiscan .4mg  over 15 seconds. 33.0 mCi of Technetium 39m Sestamibi injected at 30 seconds. Quantitative SPECT images were obtained in the vertical, horizontal and [name] axis planes after a 45 minute delay. Rest images were obtained with similar planes and delay using 10.2 mCi of Technetium 69m Sestamibi.  FINDINGS: ECG: : Baseline electrocardiogram shows sinus rhythm, first-degree AV block, right axis deviation, and anterior infarct.  Resting heart rate 100 and blood pressure 152/57. Following infusion heart rate 100 and blood pressure 129/66.  Symptoms:  No chest pain.  RAW Data:  Adequate image acquisition.  Quantitiative Gated SPECT EF: Gated ejection fraction 58% and normal wall motion. TID- 1.46. End-diastolic volume 99 mL. End systolic volume 41 mL.  Perfusion Images: Images were obtained in the Yasin Ducat axis and vertical and horizontal long axes. There was no evidence of ischemia or infarction in any vascular territory.  IMPRESSION: Normal stress nuclear study with no chest pain, no electrocardiographic  changes, and no ischemia or infarction. Gated ejection fraction 58% and normal wall motion.  Kirk Ruths   Electronically Signed   By: Kirk Ruths   On: 10/08/2014 16:41    Scheduled Meds: . antiseptic oral rinse  7 mL Mouth Rinse BID  . arformoterol  15 mcg Nebulization BID  . aspirin EC  81 mg Oral Daily  . atorvastatin  80 mg Oral q1800  . budesonide (PULMICORT) nebulizer solution  0.25 mg Nebulization BID  . clopidogrel  75 mg Oral Daily  . enoxaparin (LOVENOX) injection  40 mg Subcutaneous Q24H  . hydrALAZINE  25 mg Oral TID  . ipratropium-albuterol  3 mL Nebulization Q4H  . iron polysaccharides  150 mg Oral BID  . isosorbide mononitrate  30 mg Oral Daily  . levofloxacin (LEVAQUIN) IV  500 mg Intravenous Q48H  . methylPREDNISolone (SOLU-MEDROL) injection  60 mg Intravenous 3 times per day  . montelukast  10 mg Oral QHS  . nebivolol  10 mg Oral Daily  . pantoprazole  80 mg Oral BID  . sodium chloride  3 mL Intravenous Q12H   Continuous Infusions:   Principal Problem:   Chest pain Active Problems:   CKD (chronic kidney disease), stage III   New onset atrial fibrillation   COPD exacerbation   COLD (chronic obstructive lung disease)   Acute kidney injury   Atrial fibrillation, unspecified    Time spent: 30 min    Theadore Blunck, [facility] Pager [phone]. If 7PM-7AM, please contact  night-coverage at [email or web address], password [facility] 10/09/2014, 2:47 PM  LOS: 5 days

## 2014-10-10 ENCOUNTER — Ambulatory Visit: Payer: Self-pay | Admitting: Licensed Clinical Social Worker

## 2014-10-10 ENCOUNTER — Other Ambulatory Visit: Payer: Self-pay | Admitting: Licensed Clinical Social Worker

## 2014-10-10 DIAGNOSIS — B370 Candidal stomatitis: Secondary | ICD-10-CM

## 2014-10-10 DIAGNOSIS — R072 Precordial pain: Secondary | ICD-10-CM

## 2014-10-10 LAB — BASIC METABOLIC PANEL
Anion gap: 12 (ref 5–15)
BUN: 63 mg/dL — AB (ref 6–23)
CHLORIDE: 95 mmol/L — AB (ref 96–112)
CO2: 30 mmol/L (ref 19–32)
Calcium: 8.7 mg/dL (ref 8.4–10.5)
Creatinine, Ser: 1.99 mg/dL — ABNORMAL HIGH (ref 0.50–1.35)
GFR calc Af Amer: 34 mL/min — ABNORMAL LOW (ref >90)
GFR calc non Af Amer: 30 mL/min — ABNORMAL LOW (ref >90)
GLUCOSE: 189 mg/dL — AB (ref 70–99)
Potassium: 4.1 mmol/L (ref 3.5–5.1)
Sodium: 137 mmol/L (ref 135–145)

## 2014-10-10 MED ORDER — NYSTATIN 100000 UNIT/ML MT SUSP
5.0000 mL | Freq: Four times a day (QID) | OROMUCOSAL | Status: DC
  Filled 2014-10-10 (×4): qty 5

## 2014-10-10 MED ORDER — FUROSEMIDE 40 MG PO TABS
60.0000 mg | ORAL_TABLET | Freq: Two times a day (BID) | ORAL | Status: DC
  Administered 2014-10-10 – 2014-10-11 (×2): 60 mg via ORAL
  Filled 2014-10-10 (×5): qty 1

## 2014-10-10 MED ORDER — CLOTRIMAZOLE 10 MG MT TROC
10.0000 mg | Freq: Every day | OROMUCOSAL | Status: DC
  Administered 2014-10-10 – 2014-10-13 (×15): 10 mg via ORAL
  Filled 2014-10-10 (×20): qty 1

## 2014-10-10 NOTE — Progress Notes (Signed)
Checked the telemetry with Dr. Sallyanne Kuster, wandering pacemaker with variable AV conduction. Continue to monitor. No indication for PPM.   Cardiology will sign off, please call with questions.   Hilbert Corrigan PA Pager: [phone]

## 2014-10-10 NOTE — Progress Notes (Signed)
[facility] PROGRESS NOTE  [name] KWI:[id number] DOB: [birth year] DOA: 10/04/2014 PCP:  Melinda Crutch, MD  Brief Summary 81 year old male admitted for left sided pleuritc chest pain and sob. He was admitted for COPD exacerbation and evaluation of ACS. His troponins came back elevated and cardiology was consulted.    Assessment/Plan  Pleuritic chest pain, may be related to COPD exacerbation -  EKG did not show any significant ischemic changes -  Minimally elevated troponins -  ECHO:  left ventricular systolic function slightly reduced to 45%, but no regional wall abnormalities. There was grade 1 diastolic dysfunction.  -  Myoview stress test:  normal study with no chest pain or ischemia or infarction.  -  Pain control as needed  Acute on chronic hypoxic respiratory failure secondary to Copd exacerbation, still wheezy and breathless and states he feels worse, but on exam, he appears better -  Continue solumedrol 40mg  IV BID.  Will not increase or taper today -  Continue bronchodilators  -  Continue brovana and pulmicort neb treatments -  Continue levaquin, okay to convert to PO, last dose this evening -  Pulmonology consult in AM - follows with Dr. 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  -  Currently on aspirin and plavix.  -  Okay to d/c telemetry  Chronic diastolic heart failure with possible mild decrease in LV, but ECHO was reportedly not of good quality.  Developing some mild lower extermity edema -   does not appear to be fluid overloaded.  -  CXR shows basilar atelectasis, incentive spirometry ordered -  Continue BB, imdur -  resume home dose lasix 60mg  BID  -  Hold metolazone today and resume tomorrow if creatinine stable  Acute on CK stage III (cr 1.7 baseline):  resolving -  Resume lasix -  Continue to trend creatinine BUN and creatinine improving slightly.  . -  US renal does not show any hydronephrosis.  -  Consider nephrology consultation if not improving -  Renally dose medications and minimize nephrotoxins  Hyperkalemia, resolved.   Mag level normal.   Hypertension, well controlled -  Resume home meds.   Thrush -  Start clotrimazole troches  Diet:  Healthy heart Access:  PIV IVF:  off Proph:  lovenox  Code Status: full Family Communication: patient alone Disposition Plan: pending improvement in breathing    Consultants:  cardiology  Procedures:  CXR  NM stress  ECHO  Antibiotics:  Doxyc 3/18 > 3/21  Levofloxacin 3/21  HPI/Subjective:  Still coughing and SOB at rest.  Felt more wheezy this morning after a long period without a nebulizer treatment.  Has a very sore throat making it difficult to eat this morning.   Objective: Filed Vitals:   10/10/14 0119 10/10/14 0300 10/10/14 0920 10/10/14 1024  BP:  130/84  118/58  Pulse:  88  91  Temp:  98.8 F (37.1 C)    TempSrc:  Oral    Resp:  20    Height:  Weight:  128 kg (282 lb 3 oz)    SpO2: 96% 96% 97%     Intake/Output Summary (Last 24 hours) at 10/10/14 1057 Last data filed at 10/10/14 1033  Gross per 24 hour  Intake   1600 ml  Output   2125 ml  Net   -525 ml   Filed Weights   10/08/14 0535 10/09/14 0533 10/10/14 0300  Weight: 127.143 kg (280 lb 4.8 oz) 126.916 kg (279 lb 12.8 oz) 128 kg (282 lb 3 oz)    Exam:   General:  Obese M, able to speak , obvious wheezing and SCM retractions  HEENT:  NCAT, MMM, white plaques with erythematous base on soft palate, tonsils  posterior OP  Cardiovascular:  RRR, nl S1, S2 no mrg, 2+ pulses, warm extremities  Respiratory:  Diminished bilateral BS with full expiratory wheeze, no focal rales or rhonchi  Abdomen:   NABS, soft, NT/ND  MSK:   Normal tone and bulk, 1+ pitting bilateral LEE  Neuro:  Grossly intact  Data Reviewed: Basic Metabolic Panel:  Recent Labs Lab 10/07/14 0531 10/08/14 0305 10/08/14 1510 10/09/14 0332 10/10/14 0530  NA 137 136 136 136 137  K 3.1* 5.5* 4.0 3.8 4.1  CL 90* 92* 94* 94* 95*  CO2 34* 32 27 30 30   GLUCOSE 168* 160* 207* 147* 189*  BUN 66* 69* 68* 62* 63*  CREATININE 2.65* 2.35* 2.47* 2.44* 1.99*  CALCIUM 8.7 8.4 8.6 8.6 8.7  MG 2.2 2.5  --   --   --    Liver Function Tests:  Recent Labs Lab 10/04/14 0655  AST 58*  ALT 23  ALKPHOS 74  BILITOT 2.2*  PROT 6.6  ALBUMIN 3.5   No results for input(s): LIPASE, AMYLASE in the last 168 hours. No results for input(s): AMMONIA in the last 168 hours. CBC:  Recent Labs Lab 10/04/14 0040 10/04/14 0655 10/05/14 0400 10/06/14 0544 10/07/14 0531  WBC 7.4 7.8 7.0 8.3 9.6  NEUTROABS 4.0 4.2  --   --   --   HGB 15.0 15.7 14.8 15.1 15.1  HCT 46.2 47.7 45.4 46.2 45.4  MCV 93.9 93.5 94.0 93.5 92.1  PLT 189 191 183 174 159   Cardiac Enzymes:  Recent Labs Lab 10/04/14 0655 10/04/14 1127 10/04/14 1916  TROPONINI 0.05* 0.06* 0.06*   BNP (last 3 results)  Recent Labs  10/04/14 0040  BNP 138.5*    ProBNP (last 3 results) No results for input(s): PROBNP in the last 8760 hours.  CBG:  Recent Labs Lab 10/04/14 0418 10/04/14 1119  GLUCAP 113* 126*    No results found for this or any previous visit (from the past 240 hour(s)).   Studies: Nm Myocar Multi W/spect W/wall Motion / Ef  10/08/2014   CLINICAL DATA:  Chest pain  EXAM: Lexiscan Myovue  TECHNIQUE: The patient received IV Lexiscan .4mg  over 15 seconds. 33.0 mCi of Technetium 101m Sestamibi injected at 30 seconds. Quantitative SPECT images were  obtained in the vertical, horizontal and Mirah Nevins axis planes after a 45 minute delay. Rest images were obtained with similar planes and delay using 10.2 mCi of Technetium 17m Sestamibi.  FINDINGS: ECG: : Baseline electrocardiogram shows sinus rhythm, first-degree AV block, right axis deviation, and anterior infarct. Resting heart rate 100 and blood pressure 152/57. Following infusion heart rate 100 and blood pressure 129/66.  Symptoms:  No chest pain.  RAW Data:  Adequate image acquisition.  Quantitiative Gated SPECT EF: Gated ejection fraction 58% and  normal wall motion. TID- 1.46. End-diastolic volume 99 mL. End systolic volume 41 mL.  Perfusion Images: Images were obtained in the Kervin Bones axis and vertical and horizontal long axes. There was no evidence of ischemia or infarction in any vascular territory.  IMPRESSION: Normal stress nuclear study with no chest pain, no electrocardiographic changes, and no ischemia or infarction. Gated ejection fraction 58% and normal wall motion.  Kirk Ruths   Electronically Signed   By: Kirk Ruths   On: 10/08/2014 16:41    Scheduled Meds: . antiseptic oral rinse  7 mL Mouth Rinse BID  . arformoterol  15 mcg Nebulization BID  . aspirin EC  81 mg Oral Daily  . atorvastatin  80 mg Oral q1800  . budesonide (PULMICORT) nebulizer solution  0.5 mg Nebulization BID  . clopidogrel  75 mg Oral Daily  . enoxaparin (LOVENOX) injection  40 mg Subcutaneous Q24H  . fluticasone  2 spray Each Nare Daily  . hydrALAZINE  25 mg Oral TID  . ipratropium-albuterol  3 mL Nebulization Q6H  . iron polysaccharides  150 mg Oral BID  . isosorbide mononitrate  30 mg Oral Daily  . levofloxacin (LEVAQUIN) IV  500 mg Intravenous Q48H  . methylPREDNISolone (SOLU-MEDROL) injection  40 mg Intravenous BID  . montelukast  10 mg Oral QHS  . nebivolol  10 mg Oral Daily  . nystatin  5 mL Oral QID  . pantoprazole  80 mg Oral BID  . sodium chloride  3 mL Intravenous Q12H   Continuous Infusions:    Principal Problem:   Chest pain Active Problems:   CKD (chronic kidney disease), stage III   New onset atrial fibrillation   COPD exacerbation   COLD (chronic obstructive lung disease)   Acute kidney injury   Atrial fibrillation, unspecified    Time spent: 30 min    Arihaan Bellucci, [facility] Pager [phone]. If 7PM-7AM, please contact night-coverage at [email or web address], password [facility] 10/10/2014, 10:57 AM  LOS: 6 days

## 2014-10-10 NOTE — Patient Outreach (Signed)
[facility] Social Work  10/10/2014   [name] 1931/05/20 [id number]  Subjective:  Client stated:   "I have very little energy and have had difficulty breathing.  I was on 3 liters of oxygen at home but am on a higher setting for oxygen use at the hospital."   Objective:  Current Medications: No current facility-administered medications for this visit.   No current outpatient prescriptions on file.   Facility-Administered Medications Ordered in Other Visits  Medication Dose Route Frequency Provider Last Rate Last Dose  . acetaminophen (TYLENOL) tablet 650 mg  650 mg Oral Q6H PRN [name], MD       Or  . acetaminophen (TYLENOL) suppository 650 mg  650 mg Rectal Q6H PRN [name], MD      . antiseptic oral rinse (CPC / CETYLPYRIDINIUM CHLORIDE 0.05%) solution 7 mL  7 mL Mouth Rinse BID Hosie Poisson, MD   7 mL at 10/09/14 2216  . arformoterol (BROVANA) nebulizer solution 15 mcg  15 mcg Nebulization BID Hosie Poisson, MD   15 mcg at 10/10/14 0918  . aspirin EC tablet 81 mg  81 mg Oral Daily [name], MD   81 mg at 10/09/14 1004  . atorvastatin (LIPITOR) tablet 80 mg  80 mg Oral q1800 [name], MD   80 mg at 10/09/14 1838  . benzonatate (TESSALON) capsule 200 mg  200 mg Oral TID PRN Hosie Poisson, MD      . budesonide (PULMICORT) nebulizer solution 0.5 mg  0.5 mg Nebulization BID Janece Canterbury, MD   0.5 mg at 10/10/14 0918  . clopidogrel (PLAVIX) tablet 75 mg  75 mg Oral Daily [name], MD   75 mg at 10/09/14 1002  . enoxaparin (LOVENOX) injection 40 mg  40 mg Subcutaneous Q24H Skeet Simmer, RPH   40 mg at 10/09/14 1002  . fluticasone (FLONASE) 50 MCG/ACT nasal spray 2 spray  2 spray Each Nare Daily Janece Canterbury, MD   2 spray at 10/09/14 1838  . guaiFENesin-dextromethorphan (ROBITUSSIN DM) 100-10 MG/5ML syrup 5 mL  5 mL Oral Q4H PRN Hosie Poisson, MD   5 mL at 10/08/14 9211  . hydrALAZINE (APRESOLINE) tablet 25 mg  25 mg Oral TID  [name], MD   25 mg at 10/09/14 2204  . ipratropium-albuterol (DUONEB) 0.5-2.5 (3) MG/3ML nebulizer solution 3 mL  3 mL Nebulization Q6H Janece Canterbury, MD   3 mL at 10/10/14 [phone]  . iron polysaccharides (NIFEREX) capsule 150 mg  150 mg Oral BID [name], MD   150 mg at 10/09/14 2208  . isosorbide mononitrate (IMDUR) 24 hr tablet 30 mg  30 mg Oral Daily Arnoldo Lenis, MD   30 mg at 10/09/14 1003  . levalbuterol (XOPENEX) nebulizer solution 0.63 mg  0.63 mg Nebulization Q6H PRN [name], MD   0.63 mg at 10/08/14 1035  . levofloxacin (LEVAQUIN) IVPB 500 mg  500 mg Intravenous Q48H Hosie Poisson, MD   500 mg at 10/09/14 1843  . magic mouthwash w/lidocaine  5 mL Oral TID PRN Hosie Poisson, MD   5 mL at 10/07/14 0954  . menthol-cetylpyridinium (CEPACOL) lozenge 3 mg  1 lozenge Oral PRN Hosie Poisson, MD   3 mg at 10/07/14 1604  . methylPREDNISolone sodium succinate (SOLU-MEDROL) 40 mg/mL injection 40 mg  40 mg Intravenous BID Janece Canterbury, MD   40 mg at 10/09/14 2215  . montelukast (SINGULAIR) tablet 10 mg  10 mg Oral QHS [name], MD   10 mg at 10/09/14 2205  . naphazoline-pheniramine (NAPHCON-A) 0.025-0.3 % ophthalmic solution 1 drop  1 drop Both Eyes QID PRN Hosie Poisson, MD   1 drop at 10/09/14 2206  . nebivolol (BYSTOLIC) tablet 10 mg  10 mg Oral Daily [name], MD   10 mg at 10/09/14 1004  . nitroGLYCERIN (NITROSTAT) SL tablet 0.4 mg  0.4 mg Sublingual Q5 min PRN [name], MD      . ondansetron [facility]) tablet 4 mg  4 mg Oral Q6H PRN [name], MD       Or  . ondansetron [facility]) injection 4 mg  4 mg Intravenous Q6H PRN [name], MD      . pantoprazole (PROTONIX) EC tablet 80 mg  80 mg Oral BID [name], MD   80 mg at 10/09/14 2215  . sodium chloride 0.9 % injection 3 mL  3 mL Intravenous Q12H [name], MD   3 mL at 10/09/14 2204    Functional Status: In your present state of  health, do you have any difficulty performing the following activities: 10/06/2014  Is the patient deaf or have difficulty hearing? Y  Hearing Y  Vision N  Difficulty concentrating or making decisions Y  Walking or climbing stairs? N  Doing errands, shopping? N    Fall/Depression Screening: No flowsheet data found.  Assessment:  CSW completed chart review for client on 10/10/14.   CSW spoke via phone with client on 10/10/14. CSW verified identity of client.  Client is currently at [facility] in [place], [place].  Client said he had been at the hospital for 8 days for breathing difficulties.  Client said he could not get his breath at home even though he was on oxygen at home as prescribed. He said he had been receiving family support and visits from his daughter.  He said he was weak and uses a walker to walk at present.  He said he has to have assistance at this time with activities of daily living.  He said he was not depressed but just needed more assistance with daily care needs at present.  CSW and client spoke of nursing support client had received while at home from [facility] practitioner Deloria Lair.  Client said that at present he had not been able to participate in therapy services at the hospital due to decreased strength and energy of client.  Client said he was communicating with nurses and doctors at the hospital to discuss his treatment plan at present.  Client reported to [place] that client did have a [place] in place at present.  CSW and client were able to discuss and complete functional assessment and fall risk assessment for client on 10/10/14.  Client was appreciative of call from [place] on 10/10/14.   Plan:  Client to take medications as prescribed and to attend scheduled medical appointments. Client to communicate with [facility] RN assisgned to discuss nursing needs of client. Client to cooperate with care providers at [facility] in  [place]. CSW to call client in one week to assess status/needs of client at that time.   Norva Riffle.Lillyauna Jenkinson MSW, LCSW Licensed Clinical Social Worker [facility] Care Management [phone]

## 2014-10-11 ENCOUNTER — Ambulatory Visit: Payer: Medicare Other | Admitting: Licensed Clinical Social Worker

## 2014-10-11 LAB — BASIC METABOLIC PANEL
ANION GAP: 8 (ref 5–15)
BUN: 47 mg/dL — ABNORMAL HIGH (ref 6–23)
CALCIUM: 8.7 mg/dL (ref 8.4–10.5)
CO2: 32 mmol/L (ref 19–32)
CREATININE: 1.98 mg/dL — AB (ref 0.50–1.35)
Chloride: 96 mmol/L (ref 96–112)
GFR calc Af Amer: 34 mL/min — ABNORMAL LOW (ref >90)
GFR calc non Af Amer: 30 mL/min — ABNORMAL LOW (ref >90)
GLUCOSE: 174 mg/dL — AB (ref 70–99)
Potassium: 4.1 mmol/L (ref 3.5–5.1)
SODIUM: 136 mmol/L (ref 135–145)

## 2014-10-11 MED ORDER — FUROSEMIDE 80 MG PO TABS
80.0000 mg | ORAL_TABLET | Freq: Two times a day (BID) | ORAL | Status: DC
  Administered 2014-10-11 – 2014-10-12 (×2): 80 mg via ORAL
  Filled 2014-10-11 (×4): qty 1

## 2014-10-11 MED ORDER — METOLAZONE 2.5 MG PO TABS
2.5000 mg | ORAL_TABLET | ORAL | Status: DC
  Administered 2014-10-11 – 2014-10-13 (×2): 2.5 mg via ORAL
  Filled 2014-10-11 (×2): qty 1

## 2014-10-11 MED ORDER — PREDNISONE 50 MG PO TABS
50.0000 mg | ORAL_TABLET | Freq: Two times a day (BID) | ORAL | Status: DC
Start: 2014-10-11 — End: 2014-10-12
  Administered 2014-10-11 – 2014-10-12 (×3): 50 mg via ORAL
  Filled 2014-10-11 (×4): qty 1

## 2014-10-11 NOTE — Plan of Care (Signed)
Problem: Phase II Progression Outcomes Goal: If positive for MI, change to MI Path Outcome: Not Applicable Date Met:  01/58/68 Negative for MI, changed to COPD GOLD pathway today.

## 2014-10-11 NOTE — Plan of Care (Signed)
Problem: Consults Goal: COPD Patient Education (See Patient Education Module for education specifics.)  Outcome: Progressing COPD GOLD: Living Better with COPD  Information packet given to pt today.

## 2014-10-11 NOTE — Progress Notes (Signed)
Patient stated he uses nebulizers at home. Patient is using the nebulizer correctly and tolerates it well at this time RT will continue to monitor.

## 2014-10-11 NOTE — Progress Notes (Signed)
[facility] PROGRESS NOTE  [name] XIP:[id number] DOB: Jun 29, 1931 DOA: 10/04/2014 PCP:  Melinda Crutch, MD  Brief Summary 81 year old male admitted for left sided pleuritc chest pain and sob. He was admitted for COPD exacerbation and evaluation of ACS. His troponins were elevated and cardiology was consulted, however, they remained minimally elevated and stable and were felt to be secondary to his CKD.  He has been treated for COPD exacerbation and has had slow recovery.    Assessment/Plan  Pleuritic chest pain, may be related to COPD exacerbation -  EKG did not show any significant ischemic changes -  Minimally elevated troponins which are plateaued and likely due to CKD -  ECHO:  left ventricular systolic function slightly reduced to 45%, but no regional wall abnormalities. There was grade 1 diastolic dysfunction.  -  Myoview stress test:  normal study with no chest pain or ischemia or infarction.  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  -  Currently on aspirin and plavix.  -  D/c telemetry  Chronic diastolic heart failure with possible mild decrease in LV, but ECHO was reportedly not of good quality.  Developing some mild lower extermity edema -   does not appear to be fluid overloaded.  -  CXR shows basilar atelectasis, incentive spirometry ordered -  Continue BB, imdur -  Increase to lasix 80mg  BID  -  resume metolazone  Acute on CK stage III (cr 1.7 baseline):  Resolving -  Continue to trend creatinine BUN and creatinine improving slightly.  . -  US renal does not show any hydronephrosis.  -  Renally dose medications and minimize nephrotoxins  Hyperkalemia, resolved.   Mag level normal.   Hypertension, well controlled -  Resume home meds.   Thrush, improving -  Continue clotrimazole troches  Diet:  Healthy heart Access:  PIV IVF:  off Proph:  lovenox  Code Status: full Family Communication: patient alone Disposition Plan: pending improvement in breathing    Consultants:  cardiology  Procedures:  CXR  NM stress  ECHO  Antibiotics:  Doxyc 3/18 > 3/21  Levofloxacin 3/21  HPI/Subjective:  Still coughing and SOB at rest.  Sore throat improving   Objective: Filed Vitals:   10/10/14 2115 10/11/14 0649 10/11/14 0801 10/11/14 1400  BP: 120/70 117/67  130/73  Pulse: 71 88  52  Temp: 97.9 F (36.6 C) 97.8 F (36.6 C)  97.8 F (36.6 C)  TempSrc: Oral Oral  Oral  Resp: 18 17  20   Height:      Weight:  127.325 kg (280 lb 11.2 oz)    SpO2: 96% 95% 93% 93%    Intake/Output Summary (Last 24 hours) at 10/11/14 1605 Last data filed at 10/11/14 1300  Gross per 24  hour  Intake   1376 ml  Output   1225 ml  Net    151 ml   Filed Weights   10/09/14 0533 10/10/14 0300 10/11/14 0649  Weight: 126.916 kg (279 lb 12.8 oz) 128 kg (282 lb 3 oz) 127.325 kg (280 lb 11.2 oz)    Exam:   General:  Obese M, able to speak , wheezing and SCM retractions  HEENT:  NCAT, MMM, improving white plaques with erythematous base on soft palate  Cardiovascular:  RRR, nl S1, S2 no mrg, 2+ pulses, warm extremities  Respiratory:  Diminished bilateral BS with full expiratory wheeze, no focal rales or rhonchi  Abdomen:    NABS, soft, NT/ND  MSK:   Normal tone and bulk, 1+ pitting bilateral LEE  Neuro:  Grossly intact  Data Reviewed: Basic Metabolic Panel:  Recent Labs Lab 10/07/14 0531 10/08/14 0305 10/08/14 1510 10/09/14 0332 10/10/14 0530 10/11/14 0621  NA 137 136 136 136 137 136  K 3.1* 5.5* 4.0 3.8 4.1 4.1  CL 90* 92* 94* 94* 95* 96  CO2 34* 32 27 30 30  32  GLUCOSE 168* 160* 207* 147* 189* 174*  BUN 66* 69* 68* 62* 63* 47*  CREATININE 2.65* 2.35* 2.47* 2.44* 1.99* 1.98*  CALCIUM 8.7 8.4 8.6 8.6 8.7 8.7  MG 2.2 2.5  --   --   --   --    Liver Function Tests: No results for input(s): AST, ALT, ALKPHOS, BILITOT, PROT, ALBUMIN in the last 168 hours. No results for input(s): LIPASE, AMYLASE in the last 168 hours. No results for input(s): AMMONIA in the last 168 hours. CBC:  Recent Labs Lab 10/05/14 0400 10/06/14 0544 10/07/14 0531  WBC 7.0 8.3 9.6  HGB 14.8 15.1 15.1  HCT 45.4 46.2 45.4  MCV 94.0 93.5 92.1  PLT 183 174 159   Cardiac Enzymes:  Recent Labs Lab 10/04/14 1916  TROPONINI 0.06*   BNP (last 3 results)  Recent Labs  10/04/14 0040  BNP 138.5*    ProBNP (last 3 results) No results for input(s): PROBNP in the last 8760 hours.  CBG: No results for input(s): GLUCAP in the last 168 hours.  No results found for this or any previous visit (from the past 240 hour(s)).   Studies: No results found.  Scheduled Meds: . antiseptic oral rinse  7 mL Mouth Rinse BID  . arformoterol  15 mcg Nebulization BID  . aspirin EC  81 mg Oral Daily  . atorvastatin  80 mg Oral q1800  . budesonide (PULMICORT) nebulizer solution  0.5 mg Nebulization BID  . clopidogrel  75 mg Oral Daily  . clotrimazole  10 mg Oral 5 X Daily  . enoxaparin (LOVENOX) injection  40 mg Subcutaneous Q24H  . fluticasone  2 spray Each Nare Daily  . furosemide  80 mg Oral BID  . hydrALAZINE  25 mg Oral TID  . ipratropium-albuterol  3 mL Nebulization Q6H  . iron polysaccharides  150 mg Oral BID  .  isosorbide mononitrate  30 mg Oral Daily  . levofloxacin (LEVAQUIN) IV  500 mg Intravenous Q48H  . montelukast  10 mg Oral QHS  . nebivolol  10 mg Oral Daily  . pantoprazole  80 mg Oral BID  . predniSONE  50 mg Oral BID WC  . sodium chloride  3 mL Intravenous Q12H   Continuous Infusions:   Principal Problem:   Chest pain Active Problems:   CKD (chronic kidney disease), stage III   New  onset atrial fibrillation   COPD exacerbation   COLD (chronic obstructive lung disease)   Acute kidney injury   Atrial fibrillation, unspecified   Thrush, oral    Time spent: 30 min    Weslynn Ke, [facility] [facility] Pager [phone]. If 7PM-7AM, please contact night-coverage at [email or web address], password [facility] 10/11/2014, 4:05 PM  LOS: 7 days

## 2014-10-11 NOTE — Progress Notes (Signed)
I cosign all documentation and medication administration by Tina Griffiths, student RN for this shift.

## 2014-10-11 NOTE — Progress Notes (Signed)
Physical Therapy Treatment Patient Details Name: [name] MRN: [id number] DOB: [birth year] Today's Date: 10/11/2014    History of Present Illness Patient is an 81 yo male admitted 10/04/14 with SOB and chest pain. Patient with COPD exacerbation and Afib.  PMH:  CAD, CABG, CHF, HTN, COPD, CKD, syncope, HOH, OSA, arthritis, on home O2 at 2 l/min    PT Comments    Pt mobilizing at min guard today. Continues to demo decr overall activity tolerance and generalized LE weakness. Cont to follow per POC. Encouraged incr mobility over weekend with nursing and HEP for LE strengthening.   Follow Up Recommendations  Home health PT;Supervision - Intermittent     Equipment Recommendations   (rollator)    Recommendations for Other Services       Precautions / Restrictions Precautions Precautions: Fall Precaution Comments: Patient with h/o syncopal episodes with falls - 4 in last 6 months.  Most recent fall was 1 week ago. Restrictions Weight Bearing Restrictions: No    Mobility  Bed Mobility               General bed mobility comments: pt up in chair  Transfers Overall transfer level: Needs assistance Equipment used: Rolling walker (2 wheeled) Transfers: Sit to/from Stand Sit to Stand: Supervision         General transfer comment: min cues for hand placement   Ambulation/Gait Ambulation/Gait assistance: Min guard Ambulation Distance (Feet): 110 Feet Assistive device: Rolling walker (2 wheeled) Gait Pattern/deviations: Step-through pattern;Decreased stride length;Decreased weight shift to right;Decreased weight shift to left;Shuffle;Wide base of support Gait velocity: Decreased Gait velocity interpretation: Below normal speed for age/gender General Gait Details: pt guarded; rated exertion at 5/10; ambulating on 2L o2; min guard to steady RW with directional changes   Stairs            Wheelchair Mobility    Modified Rankin (Stroke Patients Only)        Balance Overall balance assessment: Needs assistance Sitting-balance support: Feet supported;No upper extremity supported Sitting balance-Leahy Scale: Good     Standing balance support: During functional activity;No upper extremity supported Standing balance-Leahy Scale: Fair Standing balance comment: stood for brief period of time without UE support                    Cognition Arousal/Alertness: Awake/alert Behavior During Therapy: WFL for tasks assessed/performed Overall Cognitive Status: Within Functional Limits for tasks assessed                      Exercises General Exercises - Lower Extremity Ankle Circles/Pumps: AROM;Both;10 reps;Seated Long Arc Quad: AROM;Both;10 reps;Seated Hip ABduction/ADduction: AROM;Both;10 reps Straight Leg Raises: AROM;Both;10 reps Hip Flexion/Marching: AROM;Strengthening;Both;10 reps;Seated    General Comments General comments (skin integrity, edema, etc.): reviewed HEP for LEs      Pertinent Vitals/Pain Pain Assessment: No/denies pain    Home Living                      Prior Function            PT Goals (current goals can now be found in the care plan section) Acute Rehab PT Goals Patient Stated Goal: to keep walking so i can get out of here PT Goal Formulation: With patient Time For Goal Achievement: 10/13/14 Potential to Achieve Goals: Good Progress towards PT goals: Progressing toward goals    Frequency  Min 3X/week    PT Plan Current plan remains appropriate;Equipment  recommendations need to be updated    Co-evaluation             End of Session Equipment Utilized During Treatment: Gait belt;Oxygen Activity Tolerance: Patient tolerated treatment well Patient left: in chair;with call bell/phone within reach     Time: 1332-1350 PT Time Calculation (min) (ACUTE ONLY): 18 min  Charges:  $Gait Training: 8-22 mins                    G CodesGustavus Bryant PT  [phone] 10/11/2014, 4:16 PM

## 2014-10-12 DIAGNOSIS — B370 Candidal stomatitis: Secondary | ICD-10-CM

## 2014-10-12 LAB — BASIC METABOLIC PANEL
Anion gap: 9 (ref 5–15)
BUN: 47 mg/dL — ABNORMAL HIGH (ref 6–23)
CALCIUM: 8.5 mg/dL (ref 8.4–10.5)
CO2: 28 mmol/L (ref 19–32)
Chloride: 99 mmol/L (ref 96–112)
Creatinine, Ser: 1.89 mg/dL — ABNORMAL HIGH (ref 0.50–1.35)
GFR calc Af Amer: 36 mL/min — ABNORMAL LOW (ref >90)
GFR calc non Af Amer: 31 mL/min — ABNORMAL LOW (ref >90)
Glucose, Bld: 187 mg/dL — ABNORMAL HIGH (ref 70–99)
POTASSIUM: 5.1 mmol/L (ref 3.5–5.1)
SODIUM: 136 mmol/L (ref 135–145)

## 2014-10-12 MED ORDER — SALINE SPRAY 0.65 % NA SOLN
1.0000 | NASAL | Status: DC | PRN
  Administered 2014-10-12: 1 via NASAL
  Filled 2014-10-12: qty 44

## 2014-10-12 MED ORDER — PREDNISONE 50 MG PO TABS
50.0000 mg | ORAL_TABLET | Freq: Two times a day (BID) | ORAL | Status: DC
  Administered 2014-10-13: 50 mg via ORAL
  Filled 2014-10-12 (×3): qty 1

## 2014-10-12 MED ORDER — FUROSEMIDE 10 MG/ML IJ SOLN
60.0000 mg | Freq: Two times a day (BID) | INTRAMUSCULAR | Status: DC
  Administered 2014-10-13: 60 mg via INTRAVENOUS
  Filled 2014-10-12 (×3): qty 6

## 2014-10-12 MED ORDER — FUROSEMIDE 10 MG/ML IJ SOLN
60.0000 mg | Freq: Two times a day (BID) | INTRAMUSCULAR | Status: DC
  Administered 2014-10-12: 60 mg via INTRAVENOUS
  Filled 2014-10-12: qty 6

## 2014-10-12 NOTE — Progress Notes (Signed)
INITIAL NUTRITION ASSESSMENT  DOCUMENTATION CODES Per approved criteria  -Obesity Unspecified   INTERVENTION: Monitor oral intake and add snacks/supplements as warranted  NUTRITION DIAGNOSIS: Unintended weight loss related to removal of excess fluids as evidenced by loss 10-20 pounds in the last couple weeks  Goal: Pt to meet >/= 90% of their estimated nutrition needs   Monitor:  Oral intake, diet tolerance, labs, procedures  Reason for Assessment: Consult for assessment of status  81 y.o. male  Admitting Dx: Chest pain  ASSESSMENT: 81 y.o. male with history of CAD diastolic CHF, COPD, chronic kidney disease stage III, hypertension, obesity presented to the ER because of chest pain  Pt states that his weight loss is due to fluids. He reports fatigue, nausea, vomiting, trouble swallowing, and constipation all impacted his appetite recently. However, he reports that many of these symptoms have resolved and now he is eating well with a good appetite.  Height: Ht Readings from Last 1 Encounters:  10/04/14 5\' 11"  (1.803 m)    Weight: Wt Readings from Last 1 Encounters:  10/12/14 279 lb 1.6 oz (126.599 kg)    Ideal Body Weight: 172 lbs  (78.2 kg)  % Ideal Body Weight: 162%  Wt Readings from Last 10 Encounters:  10/12/14 279 lb 1.6 oz (126.599 kg)  09/27/14 288 lb (130.636 kg)  09/19/14 289 lb 9.6 oz (131.362 kg)  08/14/14 299 lb (135.626 kg)  07/02/14 302 lb (136.986 kg)  06/19/14 296 lb 1.9 oz (134.319 kg)  12/13/13 290 lb (131.543 kg)  09/17/13 297 lb (134.718 kg)  08/16/13 294 lb (133.358 kg)  08/10/13 295 lb 11.2 oz (134.129 kg)  loss of 20 lbs since December (not significant)  Usual Body Weight: unknown  BMI:  Body mass index is 38.94 kg/(m^2).  Estimated Nutritional Needs: Kcal: 1500-1650 (12-13 kcal/kg) Protein: 78-94 g Pro (1-1.2 g/kg ibw) Fluid: per md  Skin: Redness  Diet Order: Diet Heart  EDUCATION NEEDS: -No education needs identified at  this time   Intake/Output Summary (Last 24 hours) at 10/12/14 0805 Last data filed at 10/12/14 0537  Gross per 24 hour  Intake   1656 ml  Output   3000 ml  Net  -1344 ml    Last BM: 3/23  Labs:   Recent Labs Lab 10/07/14 0531 10/08/14 0305  10/10/14 0530 10/11/14 0621 10/12/14 0357  NA 137 136  < > 137 136 136  K 3.1* 5.5*  < > 4.1 4.1 5.1  CL 90* 92*  < > 95* 96 99  CO2 34* 32  < > 30 32 28  BUN 66* 69*  < > 63* 47* 47*  CREATININE 2.65* 2.35*  < > 1.99* 1.98* 1.89*  CALCIUM 8.7 8.4  < > 8.7 8.7 8.5  MG 2.2 2.5  --   --   --   --   GLUCOSE 168* 160*  < > 189* 174* 187*  < > = values in this interval not displayed.  CBG (last 3)  No results for input(s): GLUCAP in the last 72 hours.  Scheduled Meds: . antiseptic oral rinse  7 mL Mouth Rinse BID  . arformoterol  15 mcg Nebulization BID  . aspirin EC  81 mg Oral Daily  . atorvastatin  80 mg Oral q1800  . budesonide (PULMICORT) nebulizer solution  0.5 mg Nebulization BID  . clopidogrel  75 mg Oral Daily  . clotrimazole  10 mg Oral 5 X Daily  . enoxaparin (LOVENOX) injection  40 mg  Subcutaneous Q24H  . fluticasone  2 spray Each Nare Daily  . furosemide  80 mg Oral BID  . hydrALAZINE  25 mg Oral TID  . ipratropium-albuterol  3 mL Nebulization Q6H  . iron polysaccharides  150 mg Oral BID  . isosorbide mononitrate  30 mg Oral Daily  . levofloxacin (LEVAQUIN) IV  500 mg Intravenous Q48H  . metolazone  2.5 mg Oral QODAY  . montelukast  10 mg Oral QHS  . nebivolol  10 mg Oral Daily  . pantoprazole  80 mg Oral BID  . predniSONE  50 mg Oral BID WC  . sodium chloride  3 mL Intravenous Q12H    Continuous Infusions:   Past Medical History  Diagnosis Date  . CAD (coronary artery disease)     a. s/p CABG x 3 in 1995; b.  NSTEMI 12/2011 -> Cath 6/21: LM 20-30, LAD 90, RI 95, LCX 49m, RCA 90/142m (treated w/ 2.0x16 Promus DES), VG->RCA ok, VG->RI 70-31m (treated w/ 4.0x28 Promus), LIMA->LAD ok, EF 55-60%.  Marland Kitchen HTN  (hypertension)   . HLD (hyperlipidemia)   . Morbid obesity   . Syncope   . COPD (chronic obstructive pulmonary disease)     a. on home O2  . HOH (hard of hearing)     left ear  . Asthma   . Sleep apnea   . Chronic diastolic CHF (congestive heart failure)     a. Echo 10/2011: EF 55-60%;  b. elevated EDP req diuresis.  . Chronic respiratory failure 07/11/2013    a. 3L home O2 24/7.  Marland Kitchen Orthopnea     a. Has been sleeping in a recliner x 30 yrs.  . Hypothyroidism   . GERD (gastroesophageal reflux disease)   . Arthritis     "knees; hands" (07/11/2013)  . GI bleed     a. 06/2013 adm - prepyloric ulcer, path neg for malignancy. A/w ABL anemia.  . Acute blood loss anemia     a. 06/2013 due to GIB.  . CKD (chronic kidney disease)     a. ?Based on Cr 2013 - 1.3-1.7.    Past Surgical History  Procedure Laterality Date  . Functional endoscopic sinus surgery  2006  . Hydrocele excision  2005  . Prostate surgery  1998  . Excisional hemorrhoidectomy      "twice cut them out; burnt them out once"  . Coronary angioplasty with stent placement  12/2011  . Coronary artery bypass graft  1995    CABG X3  . Inguinal hernia repair Right 2006  . Knee arthroscopy Right 1990's  . Back surgery  1952    "when I was in the [place]" (07/11/2013)  . Esophagogastroduodenoscopy N/A 07/12/2013    Procedure: ESOPHAGOGASTRODUODENOSCOPY (EGD);  Surgeon: Inda Castle, MD;  Location: [facility];  Service: Endoscopy;  Laterality: N/A;  . Left heart catheterization with coronary/graft angiogram  01/07/2012    Procedure: LEFT HEART CATHETERIZATION WITH Beatrix Fetters;  Surgeon: Peter M Martinique, MD;  Location: [facility] CATH LAB;  Service: Cardiovascular;;  . Percutaneous coronary stent intervention (pci-s) N/A 01/10/2012    Procedure: PERCUTANEOUS CORONARY STENT INTERVENTION (PCI-S);  Surgeon: Peter M Martinique, MD;  Location: [facility] CATH LAB;  Service: Cardiovascular;  Laterality: N/A;    Burtis Junes RD,  LDN Nutrition Pager: [phone] 10/12/2014 8:05 AM

## 2014-10-12 NOTE — Progress Notes (Signed)
[facility] PROGRESS NOTE  [name] PXT:[id number] DOB: 04-05-1931 DOA: 10/04/2014 PCP:  Melinda Crutch, MD  Brief Summary 81 year old male admitted for left sided pleuritc chest pain and sob. He was admitted for COPD exacerbation and evaluation of ACS. His troponins were elevated and cardiology was consulted, however, they remained minimally elevated and stable and were felt to be secondary to his CKD.  He has been treated for COPD exacerbation and has had slow recovery.  Will increase diuresis to see if that helps his dyspnea.    Assessment/Plan  Pleuritic chest pain, may be related to COPD exacerbation -  EKG did not show any significant ischemic changes -  Minimally elevated troponins which are plateaued and likely due to CKD -  ECHO:  left ventricular systolic function slightly reduced to 45%, but no regional wall abnormalities. There was grade 1 diastolic dysfunction.  -  Myoview stress test:  normal study with no chest pain or ischemia or infarction.  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  Worsening lower extermity edema -  does not appear to be fluid overloaded.  -  CXR shows basilar atelectasis, incentive spirometry ordered -  Continue BB, imdur -  D/c lasix 80mg  BID  -  Start IV lasix 60mg  IV BID -  Continue  metolazone  Paroxysmal atrial fibrillation and wandering pacemaker with variable AV conduction, CHADsvasc 5 -  maintained sinus rhythm -  Not a candidate for anticoagulation secondary to GI bleed.  -  Currently on aspirin and plavix.   Acute on CK stage III (cr 1.7 baseline):  Resolving -  Continue to trend creatinine BUN and creatinine improving slightly.  . -  US renal does not show any hydronephrosis.  -  Renally dose medications and minimize nephrotoxins  Hyperkalemia, resolved.   Mag level normal.   Hypertension, well controlled -  Resume home meds.   Thrush, improving -  Continue clotrimazole troches  Diet:  Healthy heart Access:  PIV IVF:  off Proph:  lovenox  Code Status: full Family Communication: patient alone Disposition Plan: pending improvement in breathing    Consultants:  cardiology  Procedures:  CXR  NM stress  ECHO  Antibiotics:  Doxyc 3/18 > 3/21  Levofloxacin 3/21  HPI/Subjective:  Still coughing and SOB at rest and only able to walk a few feet without dyspnea.  Before admission, was able to walk 5 miles every day.  Sore throat much better  Objective: Filed Vitals:   10/11/14 2113 10/12/14 0144 10/12/14 0526 10/12/14 0908  BP: 120/67  125/76   Pulse: 94  97   Temp: 97.9 F (36.6 C)  97.8 F (36.6 C)   TempSrc: Oral  Oral   Resp: 18  18   Height:      Weight:   126.599 kg (279  lb 1.6 oz)   SpO2: 96% 95% 97% 93%    Intake/Output Summary (Last 24 hours) at 10/12/14 1222 Last data filed at 10/12/14 1000  Gross per 24 hour  Intake   1496 ml  Output   3300 ml  Net  -1804 ml   Filed Weights   10/10/14 0300 10/11/14 0649 10/12/14 0526  Weight: 128 kg (282 lb 3 oz) 127.325 kg (280 lb 11.2 oz) 126.599 kg (279 lb 1.6 oz)    Exam:   General:  Obese M, able to speak , wheezing and SCM retractions at rest  HEENT:  NCAT, MMM, improving white plaques with erythematous base on soft palate  Cardiovascular:  RRR, nl S1, S2 no  mrg, 2+ pulses, warm extremities  Respiratory:  Diminished bilateral BS with full expiratory wheeze, no focal rales or rhonchi  Abdomen:   NABS, soft, NT/ND  MSK:   Normal tone and bulk, 1+ pitting bilateral LEE  Neuro:  Grossly intact  Data Reviewed: Basic Metabolic Panel:  Recent Labs Lab 10/07/14 0531 10/08/14 0305 10/08/14 1510 10/09/14 0332 10/10/14 0530 10/11/14 0621 10/12/14 0357  NA 137 136 136 136 137 136 136  K 3.1* 5.5* 4.0 3.8 4.1 4.1 5.1  CL 90* 92* 94* 94* 95* 96 99  CO2 34* 32 27 30 30  32 28  GLUCOSE 168* 160* 207* 147* 189* 174* 187*  BUN 66* 69* 68* 62* 63* 47* 47*  CREATININE 2.65* 2.35* 2.47* 2.44* 1.99* 1.98* 1.89*  CALCIUM 8.7 8.4 8.6 8.6 8.7 8.7 8.5  MG 2.2 2.5  --   --   --   --   --    Liver Function Tests: No results for input(s): AST, ALT, ALKPHOS, BILITOT, PROT, ALBUMIN in the last 168 hours. No results for input(s): LIPASE, AMYLASE in the last 168 hours. No results for input(s): AMMONIA in the last 168 hours. CBC:  Recent Labs Lab 10/06/14 0544 10/07/14 0531  WBC 8.3 9.6  HGB 15.1 15.1  HCT 46.2 45.4  MCV 93.5 92.1  PLT 174 159   Cardiac Enzymes: No results for input(s): CKTOTAL, CKMB, CKMBINDEX, TROPONINI in the last 168 hours. BNP (last 3 results)  Recent Labs  10/04/14 0040  BNP 138.5*    ProBNP (last 3 results) No results for input(s): PROBNP in the last 8760 hours.  CBG: No results for input(s): GLUCAP in the last 168 hours.  No results found for this or any previous visit (from the past 240 hour(s)).   Studies: No results found.  Scheduled Meds: . antiseptic oral rinse  7 mL Mouth Rinse BID  . arformoterol  15 mcg Nebulization BID  . aspirin EC  81 mg Oral Daily  . atorvastatin  80 mg Oral q1800  . budesonide (PULMICORT) nebulizer solution  0.5 mg Nebulization BID  . clopidogrel  75 mg Oral Daily  . clotrimazole  10 mg Oral 5 X Daily  . enoxaparin (LOVENOX) injection  40 mg Subcutaneous Q24H  . fluticasone   2 spray Each Nare Daily  . furosemide  80 mg Oral BID  . hydrALAZINE  25 mg Oral TID  . ipratropium-albuterol  3 mL Nebulization Q6H  . iron polysaccharides  150 mg Oral BID  . isosorbide mononitrate  30 mg Oral Daily  . levofloxacin (LEVAQUIN) IV  500 mg Intravenous Q48H  . metolazone  2.5 mg Oral QODAY  . montelukast  10 mg Oral QHS  . nebivolol  10 mg Oral Daily  . pantoprazole  80 mg Oral BID  . predniSONE  50 mg Oral BID WC  . sodium chloride  3 mL Intravenous Q12H   Continuous Infusions:   Principal Problem:   Chest pain Active Problems:   CKD (chronic kidney disease), stage III   New onset atrial fibrillation   COPD exacerbation   COLD (chronic obstructive lung disease)   Acute kidney injury   Atrial fibrillation, unspecified   Thrush, oral    Time spent: 30 min    Christie Copley, [facility] Pager [phone]. If 7PM-7AM, please contact night-coverage at [email or web address], password [facility] 10/12/2014, 12:22 PM  LOS: 8 days

## 2014-10-12 NOTE — Progress Notes (Signed)
I cosign all documentation and medication administration by Tina Griffiths, student RN for this shift.

## 2014-10-12 NOTE — Plan of Care (Signed)
Problem: Phase I Progression Outcomes Goal: O2 sats > or equal 90% or at baseline Outcome: Completed/Met Date Met:  10/12/14 Up walking in hallway with portable oximeter. Pulse ox stayed at 91% with O2 at 2LPM nasal cannula.

## 2014-10-12 NOTE — Progress Notes (Signed)
Pt to return home with [place] at d/c, noted.  Pt also active with [facility].  CSW signing off.  Please re-consult if any other social work needs should arise.

## 2014-10-13 MED ORDER — PREDNISONE 20 MG PO TABS: ORAL_TABLET | ORAL | Status: DC

## 2014-10-13 MED ORDER — BENZONATATE 200 MG PO CAPS: 200.0000 mg | ORAL_CAPSULE | Freq: Three times a day (TID) | ORAL | Status: DC | PRN

## 2014-10-13 MED ORDER — CLOTRIMAZOLE 10 MG MT TROC: 10.0000 mg | Freq: Every day | OROMUCOSAL | Status: DC

## 2014-10-13 MED ORDER — ALBUTEROL SULFATE (2.5 MG/3ML) 0.083% IN NEBU: 2.5000 mg | INHALATION_SOLUTION | RESPIRATORY_TRACT | Status: DC | PRN

## 2014-10-13 MED ORDER — FLUTICASONE PROPIONATE 50 MCG/ACT NA SUSP: 2.0000 | Freq: Every day | NASAL | Status: AC

## 2014-10-13 MED ORDER — IPRATROPIUM-ALBUTEROL 0.5-2.5 (3) MG/3ML IN SOLN
3.0000 mL | Freq: Four times a day (QID) | RESPIRATORY_TRACT | Status: DC
  Administered 2014-10-13 (×3): 3 mL via RESPIRATORY_TRACT
  Filled 2014-10-13 (×3): qty 3

## 2014-10-13 NOTE — Discharge Summary (Addendum)
Physician Discharge Summary  [name] KGU:[id number] DOB: May 07, 1931 DOA: 10/04/2014  PCP:  Melinda Crutch, MD  Admit date: 10/04/2014 Discharge date: 10/13/2014  Recommendations for Outpatient Follow-up:  1. F/u with pulmonology in 1 week for reevaluation 2. Long steroid taper 3. Completed antibiotics in hospital 4. Referral to pulmonary rehab by pulmonology if felt to be indicated 5. ENT evaluation within 1 month or sooner as needed for reevaluation of sinus mass  Discharge Diagnoses:  Principal Problem:   COPD exacerbation Active Problems:   Acute on chronic diastolic CHF (congestive heart failure)   CKD (chronic kidney disease), stage III   Chest pain   New onset atrial fibrillation   COLD (chronic obstructive lung disease)   Acute kidney injury   Atrial fibrillation, unspecified   Thrush, oral   Discharge Condition: stable, improved  Diet recommendation: low sodium  Wt Readings from Last 3 Encounters:  10/13/14 124.331 kg (274 lb 1.6 oz)  09/27/14 130.636 kg (288 lb)  09/19/14 131.362 kg (289 lb 9.6 oz)    History of present illness:  81 year old male admitted for left sided pleuritc chest pain and sob. He was admitted for COPD exacerbation and evaluation of ACS. His troponins were elevated and cardiology was consulted, however, they remained minimally elevated and stable and were felt to be secondary to his CKD. He was been treated for COPD exacerbation and had very slow recovery. Will increase diuresis to see if that helps his dyspnea.   Hospital Course:    Acute on chronic hypoxic respiratory failure secondary to Copd exacerbation, required several days of IV steroids and was gradually transitioned to prednisone.  He continued to have SOB at rest but by the time of discharge was able to ambulate the length of the hall without severe dyspnea or low oxygen levels.   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 There was grade 1 diastolic dysfunction.  - Myoview stress test: normal study with no chest pain or ischemia or infarction.   Possible acute on chronic diastolic heart failure with possible mild decrease in LV, but ECHO was reportedly not of good quality. Had lower extermity edema that developed during hospitalization - CXR showed basilar atelectasis -  Incentive spirometry ordered - Continue BB, imdur - was briefly treated with IV lasix BID and transitioned back to home lasix and metolazone at discharge  Paroxysmal atrial fibrillation and wandering pacemaker with variable AV conduction, CHADs2vasc 5 - maintained sinus rhythm - Not a candidate for anticoagulation secondary to GI bleed.  - Continued on aspirin and plavix.   Acute on CK stage III (cr 1.7 baseline): creatinine trended down from 2.65 and remained stable around 1.7-2 - US renal does not show any hydronephrosis.   Hyperkalemia, resolved.  Mag level normal.   Hypertension, well controlled - Continued  home meds.   Thrush due to steroids, improving - Continue clotrimazole troches   Consultants:  cardiology  Procedures:  CXR  NM stress  ECHO  Antibiotics:  Doxyc 3/18 > 3/21  Levofloxacin 3/21 > 3/27  Discharge  Exam: Filed Vitals:   10/13/14 0607  BP: 110/70  Pulse: 92  Temp: 97.6 F (36.4 C)  Resp:    Filed Vitals:   10/12/14 1527 10/12/14 2133 10/13/14 0607 10/13/14 0741  BP:  123/68 110/70   Pulse:  93 92   Temp:  97.9 F (36.6 C) 97.6 F (36.4 C)   TempSrc:  Oral Oral   Resp:      Height:      Weight:   124.331 kg (274 lb 1.6 oz)   SpO2: 95% 95% 97% 96%     General: Obese M, able to speak in full sentences   HEENT: NCAT, MMM, minimal white plaques with erythematous base on soft palate  Cardiovascular: RRR, nl S1, S2 no mrg, 2+ pulses, warm extremities  Respiratory: Diminished bilateral BS, no focal rales or rhonchi  Abdomen: NABS, soft, NT/ND  MSK: Normal tone and bulk,  Trace bilateral pitting LEE  Neuro: Grossly intact  Discharge Instructions      Discharge Instructions    (HEART FAILURE PATIENTS) Call MD:  Anytime you have any of the following symptoms: 1) 3 pound weight gain in 24 hours or 5 pounds in 1 week 2) shortness of breath, with or without a dry hacking cough 3) swelling in the hands, feet or stomach 4) if you have to sleep on extra pillows at night in order to breathe.    Complete by:  As directed      Call MD for:  difficulty breathing, headache or visual disturbances    Complete by:  As directed      Call MD for:  extreme fatigue    Complete by:  As directed      Call MD for:  hives    Complete by:  As directed      Call MD for:  persistant dizziness or light-headedness    Complete by:  As directed      Call MD for:  persistant nausea and vomiting    Complete by:  As directed      Call MD for:  severe uncontrolled pain    Complete by:  As directed      Call MD for:  temperature >100.4    Complete by:  As directed      Diet - low sodium heart healthy    Complete by:  As directed      Discharge instructions    Complete by:  As directed   You were hospitalized with COPD exacerbation.  Please take your flovent, spiriva, and albuterol as  previously prescribed.  Take prednisone in a decreasing dose, 2 tabs daily on Monday and Tues, 1 tab daily on Wed and Thur, and then take half a tab per day until you follow up with your pulmonologist.  Please also take flonase and schedule an appointment with your EAr, Nose, Throat doctor regarding your nasal polyp seen on your sinus CT.  For your sore throat, please continue to use the clotrimazole lozenges.  If your sore throat comes back after completing your treatment, call Dr. Bettina Gavia office or your primary care doctor's office for a new prescription.     Increase activity slowly    Complete by:  As directed  Medication List    STOP taking these medications        azithromycin 250 MG tablet  Commonly known as:  ZITHROMAX Z-PAK     levofloxacin 500 MG tablet  Commonly known as:  LEVAQUIN     Meclizine HCl 25 MG Chew  Commonly known as:  BONINE     potassium chloride SA 20 MEQ tablet  Commonly known as:  K-DUR,KLOR-CON      TAKE these medications        albuterol 108 (90 BASE) MCG/ACT inhaler  Commonly known as:  PROVENTIL HFA;VENTOLIN HFA  Inhale 2 puffs into the lungs every 6 (six) hours as needed for wheezing or shortness of breath.     albuterol (2.5 MG/3ML) 0.083% nebulizer solution  Commonly known as:  PROVENTIL  Take 3 mLs (2.5 mg total) by nebulization every 6 (six) hours as needed for wheezing.     ARTIFICIAL TEAR OP  Place 2 drops into both eyes daily as needed. For dry eyes     aspirin 81 MG tablet  Take 1 tablet (81 mg total) by mouth daily.     atorvastatin 80 MG tablet  Commonly known as:  LIPITOR  Take 1 tablet (80 mg total) by mouth daily at 6 PM.     benzonatate 200 MG capsule  Commonly known as:  TESSALON  Take 1 capsule (200 mg total) by mouth 3 (three) times daily as needed for cough.     BYSTOLIC 10 MG tablet  Generic drug:  nebivolol  TAKE ONE TABLET BY MOUTH ONCE DAILY     clopidogrel 75 MG tablet  Commonly known as:  PLAVIX   Take 1 tablet (75 mg total) by mouth daily with breakfast.     clotrimazole 10 MG troche  Commonly known as:  MYCELEX  Take 1 tablet (10 mg total) by mouth 5 (five) times daily.     dextromethorphan-guaiFENesin 30-600 MG per 12 hr tablet  Commonly known as:  MUCINEX DM  Take 1 tablet by mouth 2 (two) times daily as needed for cough.     FERREX 150 150 MG capsule  Generic drug:  iron polysaccharides  TAKE ONE CAPSULE BY MOUTH TWICE DAILY     fluticasone 110 MCG/ACT inhaler  Commonly known as:  FLOVENT HFA  Inhale 2 puffs into the lungs 2 (two) times daily.     fluticasone 50 MCG/ACT nasal spray  Commonly known as:  FLONASE  Place 2 sprays into both nostrils daily.     furosemide 40 MG tablet  Commonly known as:  LASIX  TAKE ONE AND ONE-HALF TABLETS BY MOUTH TWICE DAILY     hydrALAZINE 25 MG tablet  Commonly known as:  APRESOLINE  Take 1 tablet (25 mg total) by mouth 3 (three) times daily.     isosorbide mononitrate 30 MG 24 hr tablet  Commonly known as:  IMDUR  Take 1 tablet (30 mg total) by mouth daily.     metolazone 2.5 MG tablet  Commonly known as:  ZAROXOLYN  Take 2.5 mg by mouth. ON Monday, Wednesday and Friday mornings.     montelukast 10 MG tablet  Commonly known as:  SINGULAIR  Take 10 mg by mouth at bedtime.     MULTIVITAMIN & MINERAL PO  Take 1 tablet by mouth daily.     nitroGLYCERIN 0.4 MG SL tablet  Commonly known as:  NITROSTAT  Place 1 tablet (0.4 mg total) under the tongue every 5 (five) minutes as needed  for chest pain.     pantoprazole 40 MG tablet  Commonly known as:  PROTONIX  TAKE TWO TABLETS BY MOUTH TWICE DAILY     predniSONE 20 MG tablet  Commonly known as:  DELTASONE  Take 2 tabs daily for 2 days, 1 tab daily for 2 days, then continue half a tab per day until you follow up with Dr. Joya Gaskins     tiotropium 18 MCG inhalation capsule  Commonly known as:  SPIRIVA  Place 1 capsule (18 mcg total) into inhaler and inhale daily.        Follow-up Information    Follow up with [facility]. Schedule an appointment as soon as possible for a visit in 1 month.   Contact information:   [address] [phone]      Follow up with Asencion Noble, MD. Schedule an appointment as soon as possible for a visit in 1 week.   Specialty:  Pulmonary Disease   Why:  call on Monday to schedule appointment for as soon as possible   Contact information:   [place] [address] [phone]       Follow up with  Melinda Crutch, MD.   Specialty:  Family Medicine   Why:  As needed   Contact information:   [place] [address] [phone]        The results of significant diagnostics from this hospitalization (including imaging, microbiology, ancillary and laboratory) are listed below for reference.    Significant Diagnostic Studies: Dg Chest 2 View  10/06/2014   CLINICAL DATA:  Subsequent evaluation for generalized chest pain and shortness of breath for 5 days, history of COPD and hypertension  EXAM: CHEST  2 VIEW  COMPARISON:  10/04/2014  FINDINGS: Stable mild cardiac enlargement. Status post CABG previously. Vascular pattern normal. Mild opacity in the bilateral medial lung bases, slightly increased on the left. This is most consistent with atelectasis. Mild vascular congestion with no pulmonary edema.  IMPRESSION: Mild bibasilar airspace disease most likely representing atelectasis.   Electronically Signed   By: Skipper Cliche M.D.   On: 10/06/2014 19:42   Dg Chest 2 View  09/27/2014   CLINICAL DATA:  Cough and congestion for 4 weeks. Severe shortness of breath. History of COPD/ asthma, coronary artery disease and hypertension.  EXAM: CHEST  2 VIEW  COMPARISON:  07/02/2014 and 08/07/2013.  FINDINGS: There is moderate breathing artifact on the lateral view. The heart size and mediastinal contours are stable status post CABG. Patchy bibasilar  opacities appear chronic, similar to the prior examination. There is no confluent airspace opacity, edema or pleural effusion. The bones appear unchanged.  IMPRESSION: Stable postoperative chest status post CABG. Chronic bibasilar scarring.   Electronically Signed   By: Richardean Sale M.D.   On: 09/27/2014 17:35   US Renal  10/05/2014   CLINICAL DATA:  Initial evaluation acute kidney injury  EXAM: RENAL/URINARY TRACT ULTRASOUND COMPLETE  COMPARISON:  None.  FINDINGS: Right Kidney:  Length: 11.5 cm. Technically limited study with very limited detail of the right kidney. There does not appear to be hydronephrosis and the echogenicity appears within normal limits. Further detail unavailable.  Left Kidney:  Length: 14.0 cm. Detail similarly limited as on the contralateral side. There is a 34 x 39 x 41 mm cyst in the midpole and 33 x 31 x 34 mm cyst in the lower pole. These are similar to  the prior study.  Bladder:  Appears normal for degree of bladder distention.  IMPRESSION: Technically limited study showing no evidence of hydronephrosis.   Electronically Signed   By: Skipper Cliche M.D.   On: 10/05/2014 11:58   Nm Myocar Multi W/spect W/wall Motion / Ef  10/08/2014   CLINICAL DATA:  Chest pain  EXAM: Lexiscan Myovue  TECHNIQUE: The patient received IV Lexiscan .4mg  over 15 seconds. 33.0 mCi of Technetium 73m Sestamibi injected at 30 seconds. Quantitative SPECT images were obtained in the vertical, horizontal and Kaleesi Guyton axis planes after a 45 minute delay. Rest images were obtained with similar planes and delay using 10.2 mCi of Technetium 44m Sestamibi.  FINDINGS: ECG: : Baseline electrocardiogram shows sinus rhythm, first-degree AV block, right axis deviation, and anterior infarct. Resting heart rate 100 and blood pressure 152/57. Following infusion heart rate 100 and blood pressure 129/66.  Symptoms:  No chest pain.  RAW Data:  Adequate image acquisition.  Quantitiative Gated SPECT EF: Gated ejection fraction  58% and normal wall motion. TID- 1.46. End-diastolic volume 99 mL. End systolic volume 41 mL.  Perfusion Images: Images were obtained in the Rutledge Selsor axis and vertical and horizontal long axes. There was no evidence of ischemia or infarction in any vascular territory.  IMPRESSION: Normal stress nuclear study with no chest pain, no electrocardiographic changes, and no ischemia or infarction. Gated ejection fraction 58% and normal wall motion.  Kirk Ruths   Electronically Signed   By: Kirk Ruths   On: 10/08/2014 16:41   Dg Chest Port 1 View  10/04/2014   CLINICAL DATA:  Chronic shortness of breath and productive cough. Lower extremity swelling. Initial encounter.  EXAM: PORTABLE CHEST - 1 VIEW  COMPARISON:  Chest radiograph performed 09/27/2014  FINDINGS: The lungs are well-aerated. Mild vascular congestion is noted. Mild left basilar opacity may reflect atelectasis or possibly minimal interstitial edema. A small left pleural effusion is seen. There is no evidence of pneumothorax.  The cardiomediastinal silhouette is mildly enlarged. The patient is status post median sternotomy, with evidence of prior CABG. No acute osseous abnormalities are seen.  IMPRESSION: Mild vascular congestion and mild cardiomegaly. Left basilar airspace opacity may reflect atelectasis or possibly minimal interstitial edema, given clinical concern. Small left pleural effusion seen.   Electronically Signed   By: Garald Balding M.D.   On: 10/04/2014 02:01   [place] Wo Cm  10/02/2014   CLINICAL DATA:  COPD without exacerbation.  Persistent cough.  EXAM: CT PARANASAL SINUS LIMITED WITHOUT CONTRAST  TECHNIQUE: Non-contiguous multidetector CT images of the paranasal sinuses were obtained in a single plane without contrast.  COMPARISON:  None.  FINDINGS: The patient is status post functional endoscopic sinus surgery with right middle and inferior turbinectomy and medial maxillary antrectomy on the right. There is evidence of  chronic sinusitis involving the right maxilla, with circumferential bony sclerosis. The opened right maxillary antrum is completely opacified, with lobulated tissue herniating into the right nasal cavity. There is extension of tissue towards the choana. When accounting for surgical change there is no visible bony destruction. No orbital or periantral soft tissue.  Opacified right sphenoid sinus without expansion or mural thickening.  IMPRESSION: 1. A polypoid mass fills the surgically opened right maxillary antrum. Given chronic sinusitis and surgical change focally present in the right maxillary sinus, suspect a recurrent lesion. Please correlate with operative/pathology history. The tissue is readily visible through the right nostril. 2. Chronic appearing right sphenoid sinusitis.  Electronically Signed   By: Monte Fantasia M.D.   On: 10/02/2014 12:43    Microbiology: No results found for this or any previous visit (from the past 240 hour(s)).   Labs: Basic Metabolic Panel:  Recent Labs Lab 10/07/14 0531 10/08/14 0305 10/08/14 1510 10/09/14 0332 10/10/14 0530 10/11/14 0621 10/12/14 0357  NA 137 136 136 136 137 136 136  K 3.1* 5.5* 4.0 3.8 4.1 4.1 5.1  CL 90* 92* 94* 94* 95* 96 99  CO2 34* 32 27 30 30  32 28  GLUCOSE 168* 160* 207* 147* 189* 174* 187*  BUN 66* 69* 68* 62* 63* 47* 47*  CREATININE 2.65* 2.35* 2.47* 2.44* 1.99* 1.98* 1.89*  CALCIUM 8.7 8.4 8.6 8.6 8.7 8.7 8.5  MG 2.2 2.5  --   --   --   --   --    Liver Function Tests: No results for input(s): AST, ALT, ALKPHOS, BILITOT, PROT, ALBUMIN in the last 168 hours. No results for input(s): LIPASE, AMYLASE in the last 168 hours. No results for input(s): AMMONIA in the last 168 hours. CBC:  Recent Labs Lab 10/07/14 0531  WBC 9.6  HGB 15.1  HCT 45.4  MCV 92.1  PLT 159   Cardiac Enzymes: No results for input(s): CKTOTAL, CKMB, CKMBINDEX, TROPONINI in the last 168 hours. BNP: BNP (last 3 results)  Recent Labs   10/04/14 0040  BNP 138.5*    ProBNP (last 3 results) No results for input(s): PROBNP in the last 8760 hours.  CBG: No results for input(s): GLUCAP in the last 168 hours.  Time coordinating discharge: 35 minutes  Signed:  Osiris Odriscoll  [facility] 10/13/2014, 10:47 AM

## 2014-10-13 NOTE — Progress Notes (Signed)
Patient respiratory assessment done and changing patient to QID treatments of DuoNeb as he takes at home as well as adding a PRN Albuterol for night time needs if they shall arise.Patient doing well with BBs rhonchi with productive cough. Will reassess per protcol

## 2014-10-13 NOTE — Discharge Instructions (Signed)
Acute Kidney Injury Acute kidney injury is a disease in which there is sudden (acute) damage to the kidneys. The kidneys are 2 organs that lie on either side of the spine between the middle of the back and the front of the abdomen. The kidneys:  Remove wastes and extra water from the blood.   Produce important hormones. These help keep bones strong, regulate blood pressure, and help create red blood cells.   Balance the fluids and chemicals in the blood and tissues. A small amount of kidney damage may not cause problems, but a large amount of damage may make it difficult or impossible for the kidneys to work the way they should. Acute kidney injury may develop into long-lasting (chronic) kidney disease. It may also develop into a life-threatening disease called end-stage kidney disease. Acute kidney injury can get worse very quickly, so it should be treated right away. Early treatment may prevent other kidney diseases from developing.  CAUSES   A problem with blood flow to the kidneys. This may be caused by:   Blood loss.   Heart disease.   Severe burns.   Liver disease.  Direct damage to the kidneys. This may be caused by:  Some medicines.   A kidney infection.   Poisoning or consuming toxic substances.   A surgical wound.   A blow to the kidney area.   A problem with urine flow. This may be caused by:   Cancer.   Kidney stones.   An enlarged prostate. SYMPTOMS   Swelling (edema) of the legs, ankles, or feet.   Tiredness (lethargy).   Nausea or vomiting.   Confusion.   Problems with urination, such as:   Painful or burning feeling during urination.   Decreased urine production.   Frequent accidents in children who are potty trained.   Bloody urine.   Muscle twitches and cramps.   Shortness of breath.   Seizures.   Chest pain or pressure. Sometimes, no symptoms are present. DIAGNOSIS Acute kidney injury may be detected  and diagnosed by tests, including blood, urine, imaging, or kidney biopsy tests.  TREATMENT Treatment of acute kidney injury varies depending on the cause and severity of the kidney damage. In mild cases, no treatment may be needed. The kidneys may heal on their own. If acute kidney injury is more severe, your caregiver will treat the cause of the kidney damage, help the kidneys heal, and prevent complications from occurring. Severe cases may require a procedure to remove toxic wastes from the body (dialysis) or surgery to repair kidney damage. Surgery may involve:   Repair of a torn kidney.   Removal of an obstruction. Most of the time, you will need to stay overnight at the hospital.  HOME CARE INSTRUCTIONS:  Follow your prescribed diet.  Only take over-the-counter or prescription medicines as directed by your caregiver.  Do not take any new medicines (prescription, over-the-counter, or nutritional supplements) unless approved by your caregiver. Many medicines can worsen your kidney damage or need to have the dose adjusted.   Keep all follow-up appointments as directed by your caregiver.  Observe your condition to make sure you are healing as expected. SEEK IMMEDIATE MEDICAL CARE IF:  You are feeling ill or have severe pain in the back or side.   Your symptoms return or you have new symptoms.  You have any symptoms of end-stage kidney disease. These include:   Persistent itchiness.   Loss of appetite.   Headaches.   Abnormally dark   or light skin.  Numbness in the hands or feet.   Easy bruising.   Frequent hiccups.   Menstruation stops.   You have a fever.  You have increased urine production.  You have pain or bleeding when urinating. MAKE SURE YOU:   Understand these instructions.  Will watch your condition.  Will get help right away if you are not doing well or get worse Document Released: 01/18/2011 Document Revised: 10/30/2012 Document  Reviewed: 03/03/2012 Exit[employer] Patient Information 2015 [employer]. This information is not intended to replace advice given to you by your health care provider. Make sure you discuss any questions you have with your health care provider.  

## 2014-10-15 NOTE — Progress Notes (Signed)
Quick Note:  lmtcb for pt. ______ 

## 2014-10-16 ENCOUNTER — Telehealth: Payer: Self-pay | Admitting: Cardiovascular Disease

## 2014-10-16 DIAGNOSIS — E876 Hypokalemia: Secondary | ICD-10-CM

## 2014-10-16 MED ORDER — POTASSIUM CHLORIDE CRYS ER 20 MEQ PO TBCR: 20.0000 meq | EXTENDED_RELEASE_TABLET | Freq: Two times a day (BID) | ORAL | Status: DC

## 2014-10-16 MED ORDER — FUROSEMIDE 40 MG PO TABS: 60.0000 mg | ORAL_TABLET | Freq: Two times a day (BID) | ORAL | Status: DC

## 2014-10-16 NOTE — Telephone Encounter (Signed)
Reviewed Dc from hospital with Truitt Merle NP. Pt to resume K+ as was taking prior and draw bmet in 1 week. Pt was called and was taking k+ 20 meq BID, script refilled. // note, lasix was removed erroneously and put back on the list. Pt was made a 1 week bmet app. Pt agreed to plan.

## 2014-10-16 NOTE — Telephone Encounter (Signed)
New message     Pt was discharged from hosp on Sunday.  Potassium was not on his discharge summary.  Is he still supposed to take it?  Please call patient--[name] at [phone].

## 2014-10-16 NOTE — Progress Notes (Signed)
Quick Note:  LMTCB ______ 

## 2014-10-18 ENCOUNTER — Other Ambulatory Visit: Payer: Self-pay | Admitting: Nurse Practitioner

## 2014-10-23 ENCOUNTER — Encounter: Payer: Self-pay | Admitting: Critical Care Medicine

## 2014-10-23 ENCOUNTER — Other Ambulatory Visit (INDEPENDENT_AMBULATORY_CARE_PROVIDER_SITE_OTHER): Payer: Medicare Other | Admitting: *Deleted

## 2014-10-23 ENCOUNTER — Ambulatory Visit (INDEPENDENT_AMBULATORY_CARE_PROVIDER_SITE_OTHER): Payer: Medicare Other | Admitting: Critical Care Medicine

## 2014-10-23 VITALS — BP 110/6 | HR 72 | Temp 97.7°F | Ht 71.0 in | Wt 283.6 lb

## 2014-10-23 DIAGNOSIS — E876 Hypokalemia: Secondary | ICD-10-CM | POA: Diagnosis not present

## 2014-10-23 DIAGNOSIS — J449 Chronic obstructive pulmonary disease, unspecified: Secondary | ICD-10-CM

## 2014-10-23 DIAGNOSIS — Z66 Do not resuscitate: Secondary | ICD-10-CM | POA: Diagnosis not present

## 2014-10-23 LAB — BASIC METABOLIC PANEL
BUN: 54 mg/dL — AB (ref 6–23)
CHLORIDE: 99 meq/L (ref 96–112)
CO2: 32 meq/L (ref 19–32)
Calcium: 8.3 mg/dL — ABNORMAL LOW (ref 8.4–10.5)
Creatinine, Ser: 2.42 mg/dL — ABNORMAL HIGH (ref 0.40–1.50)
GFR: 27.41 mL/min — ABNORMAL LOW (ref >60.00)
GLUCOSE: 104 mg/dL — AB (ref 70–99)
POTASSIUM: 4.2 meq/L (ref 3.5–5.1)
Sodium: 138 mEq/L (ref 135–145)

## 2014-10-23 NOTE — Progress Notes (Signed)
Subjective:    Patient ID: [name], male    DOB: 18-Apr-1931, 81 y.o.   MRN: [id number]  HPI 81 y.o. male never smoker with chronic bronchitis and sinusitis  On Chronic Diastolic CHF  10/17/9620 Chief Complaint  Patient presents with  . Follow-up    Pt states he has SOB with a prod cough, white thick mucus. Wheezing with chest tightness, pt states he uses inhalers. Denies any n/v/d or f/c/s.   Pt just in hosp after last 09/2014 ov  AKI on CKD , copd exac, CHF, fluid overload, Rx pred/ABX.     Adm 3/18- 3/27  Now is better vs before adm.  Sleeps in recliner at home.  Echo showed LV worse 45%.    Review of Systems Constitutional:   No  weight loss, night sweats,  Fevers, chills,  +fatigue, or  lassitude.  HEENT:   No headaches,  Difficulty swallowing,  Tooth/dental problems, or  Sore throat,                No sneezing, itching, ear ache,  +nasal congestion, post nasal drip,   CV:  No chest pain,  Orthopnea, PND,   syncope.   GI  No heartburn, indigestion, abdominal pain, nausea, vomiting, diarrhea, change in bowel habits, loss of appetite, bloody stools.   Resp:    No chest wall deformity  Skin: no rash or lesions.  GU: no dysuria, change in color of urine, no urgency or frequency.  No flank pain, no hematuria   MS:  No joint pain or swelling.  No decreased range of motion.  No back pain.  Psych:  No change in mood or affect. No depression or anxiety.  No memory loss.         Objective:   Physical Exam BP 110/6 mmHg  Pulse 72  Temp(Src) 97.7 F (36.5 C) (Oral)  Ht 5\' 11"  (1.803 m)  Wt 283 lb 9.6 oz (128.64 kg)  BMI 39.57 kg/m2  SpO2 97%  GEN: A/Ox3; pleasant , NAD, obese   HEENT:  [place]/AT,  EACs-clear, TMs-wnl, NOSE-clear, THROAT-clear, no lesions, no postnasal drip or exudate noted.   NECK:  Supple w/ fair ROM; no JVD; normal carotid impulses w/o bruits; no thyromegaly or nodules palpated; no lymphadenopathy.  RESP  distant breath sounds no accessory  muscle use, no dullness to percussion  CARD:  RRR, no m/r/g  , tr  peripheral edema, pulses intact, no cyanosis or clubbing.  GI:   Soft & nt; nml bowel sounds; no organomegaly or masses detected.  Musco: Warm bil, no deformities or joint swelling noted.   Neuro: alert, no focal deficits noted.    Skin: Warm, no lesions or rashes    Assessment & Plan:   Obstructive chronic bronchitis without exacerbation Chronic obstructive lung disease with significant passive smoke exposure and frequent admissions Plan COPD Gold Program initiated. Continue current inhaled medications Return 1 month with Ms Royal Piedra Return Dr Joya Gaskins 2 months     Updated Medication List Outpatient Encounter Prescriptions as of 10/23/2014  Medication Sig  . albuterol (PROVENTIL) (2.5 MG/3ML) 0.083% nebulizer solution Take 3 mLs (2.5 mg total) by nebulization every 6 (six) hours as needed for wheezing.  Marland Kitchen ARTIFICIAL TEAR OP Place 2 drops into both eyes daily as needed. For dry eyes  . aspirin 81 MG tablet Take 1 tablet (81 mg total) by mouth daily.  Marland Kitchen atorvastatin (LIPITOR) 80 MG tablet Take 1 tablet (80 mg total) by mouth daily  at 6 PM.  . benzonatate (TESSALON) 200 MG capsule Take 1 capsule (200 mg total) by mouth 3 (three) times daily as needed for cough.  . BYSTOLIC 10 MG tablet TAKE ONE TABLET BY MOUTH ONCE DAILY  . clopidogrel (PLAVIX) 75 MG tablet Take 1 tablet (75 mg total) by mouth daily with breakfast.  . clotrimazole (MYCELEX) 10 MG troche Take 1 tablet (10 mg total) by mouth 5 (five) times daily.  Marland Kitchen dextromethorphan-guaiFENesin (MUCINEX DM) 30-600 MG per 12 hr tablet Take 1 tablet by mouth 2 (two) times daily as needed for cough.  Marland Kitchen FERREX 150 150 MG capsule TAKE ONE CAPSULE BY MOUTH TWICE DAILY (Patient taking differently: TAKE ONE CAPSULE BY MOUTH ONCE DAILY)  . fluticasone (FLONASE) 50 MCG/ACT nasal spray Place 2 sprays into both nostrils daily.  . fluticasone (FLOVENT HFA) 110 MCG/ACT inhaler Inhale  2 puffs into the lungs 2 (two) times daily.  . furosemide (LASIX) 40 MG tablet Take 1.5 tablets (60 mg total) by mouth 2 (two) times daily.  . hydrALAZINE (APRESOLINE) 25 MG tablet Take 1 tablet (25 mg total) by mouth 3 (three) times daily.  . isosorbide mononitrate (IMDUR) 30 MG 24 hr tablet Take 1 tablet (30 mg total) by mouth daily.  . metolazone (ZAROXOLYN) 2.5 MG tablet Take 2.5 mg by mouth. ON Monday, Wednesday and Friday mornings.  . montelukast (SINGULAIR) 10 MG tablet Take 10 mg by mouth at bedtime.  . Multiple Vitamins-Minerals (MULTIVITAMIN & MINERAL PO) Take 1 tablet by mouth daily.    . nitroGLYCERIN (NITROSTAT) 0.4 MG SL tablet Place 1 tablet (0.4 mg total) under the tongue every 5 (five) minutes as needed for chest pain.  . pantoprazole (PROTONIX) 40 MG tablet TAKE TWO TABLETS BY MOUTH TWICE DAILY  . potassium chloride SA (KLOR-CON M20) 20 MEQ tablet Take 1 tablet (20 mEq total) by mouth 2 (two) times daily.  . predniSONE (DELTASONE) 20 MG tablet Take 2 tabs daily for 2 days, 1 tab daily for 2 days, then continue half a tab per day until you follow up with Dr. Joya Gaskins  . tiotropium (SPIRIVA) 18 MCG inhalation capsule Place 1 capsule (18 mcg total) into inhaler and inhale daily.  Marland Kitchen albuterol (PROVENTIL HFA;VENTOLIN HFA) 108 (90 BASE) MCG/ACT inhaler Inhale 2 puffs into the lungs every 6 (six) hours as needed for wheezing or shortness of breath.

## 2014-10-23 NOTE — Patient Instructions (Signed)
NO change in medications DNR form filled out COPD Gold Program initiated. Return 1 month with [name] Return Dr Joya Gaskins 2 months

## 2014-10-23 NOTE — Addendum Note (Signed)
Addended by: Eulis Foster on: 10/23/2014 11:42 AM   Modules accepted: Orders

## 2014-10-24 NOTE — Assessment & Plan Note (Signed)
Chronic obstructive lung disease with significant passive smoke exposure and frequent admissions Plan COPD Gold Program initiated. Continue current inhaled medications Return 1 month with [name] Return Dr Joya Gaskins 2 months

## 2014-10-24 NOTE — Progress Notes (Signed)
Quick Note:  Called and spoke to pt. Informed pt of the results and recs per TP. Pt stated he already has an ENT doctor, Dr. Ernesto Rutherford. Pt has appt with Dr. Marcelline Mates on April 22nd. Pt aware to keep appt. Nothing further needed at this time. ______

## 2014-11-05 ENCOUNTER — Encounter: Payer: Self-pay | Admitting: *Deleted

## 2014-11-05 DIAGNOSIS — I5032 Chronic diastolic (congestive) heart failure: Secondary | ICD-10-CM

## 2014-11-05 NOTE — Patient Outreach (Signed)
[facility]) Care Management  11/05/2014  ALWYN CORDNER 10/02/1930 [id number]   Referral to Health Coach completed.  Sherrin Daisy, RN BSN [place] Management Coordinator [facility] Care Management  [phone]

## 2014-11-06 ENCOUNTER — Telehealth: Payer: Self-pay | Admitting: Nurse Practitioner

## 2014-11-06 MED ORDER — PANTOPRAZOLE SODIUM 40 MG PO TBEC: 40.0000 mg | DELAYED_RELEASE_TABLET | Freq: Every day | ORAL | Status: DC

## 2014-11-06 NOTE — Telephone Encounter (Signed)
I received a prior authorization for Protonix 40 mg - take 2 tablets twice daily.  Since this is such an unusual dose, I reviewed patient's chart and discovered that upon discharge on 07/13/13, patient was prescribed PPI BID x 1 month, then QD indefinitely per Dr. Carol Ada, GI.  Patient was discharged on Omeprazole.  On 08/10/13, patient was switched from Omeprazole to equivalent dose of Protonix (80 mg BID) due to interaction with Plavix.  It appears patient has remained on this dosage until now.  I called and discussed with patient and he states he does not know why such a high dose is needed and he thinks 1 pill per day would be sufficient.  I advised patient that it appears he was supposed to switch to once daily in early 2015 and that that change was never made.  I advised patient I will send Rx for Protonix 40 mg once daily to his pharmacy and there should not be any problem with filling it because previous denial was due to high dose exceeding quantity limitations.  I advised patient to call back with questions or concerns.

## 2014-11-22 ENCOUNTER — Ambulatory Visit (INDEPENDENT_AMBULATORY_CARE_PROVIDER_SITE_OTHER): Payer: Medicare Other | Admitting: Adult Health

## 2014-11-22 ENCOUNTER — Encounter: Payer: Self-pay | Admitting: Adult Health

## 2014-11-22 VITALS — BP 124/74 | HR 74 | Temp 97.8°F | Ht 71.0 in | Wt 283.4 lb

## 2014-11-22 DIAGNOSIS — J449 Chronic obstructive pulmonary disease, unspecified: Secondary | ICD-10-CM | POA: Diagnosis not present

## 2014-11-22 NOTE — Patient Instructions (Signed)
Continue on current regimen . Follow up with Dr. Joya Gaskins  In 6-8 weeks and As needed

## 2014-11-25 ENCOUNTER — Telehealth: Payer: Self-pay | Admitting: Adult Health

## 2014-11-25 MED ORDER — PREDNISONE 20 MG PO TABS: 10.0000 mg | ORAL_TABLET | Freq: Every day | ORAL | Status: DC

## 2014-11-25 NOTE — Telephone Encounter (Signed)
Spoke with pt. Pt is currently taking 10mg  daily of prednisone. He will need a rx to be sent in to last until he sees PW on 01/06/15. Rx has been sent in. Nothing further was needed.

## 2014-11-29 NOTE — Progress Notes (Signed)
   Subjective:    Patient ID: [name], male    DOB: [birth year], 81 y.o.   MRN: [id number]  HPI  81 yo male never smoker with chronic bronchitis and sinusitis    Chronic Diastolic CHF  10/18/85 Follow up COPD  Returns for 1 month follow up.  Reports breathing is unchanged.  Does feel some better  Has on/off wheezing, prod cough with white mucus, tightness at times. -not as bad.  Denies f/c/s, n/v/d, hemoptysis.  Taking 1/2 tab prednisone daily, will need script (pred 20 1/2 )   Review of Systems Negative except history of present illness    Objective:   Physical Exam GEN: A/Ox3; pleasant , NAD, chronically ill appearing  HEENT:  [place]/AT,  EACs-clear, TMs-wnl, NOSE-clear, THROAT-clear, no lesions, no postnasal drip or exudate noted.   NECK:  Supple w/ fair ROM; no JVD; normal carotid impulses w/o bruits; no thyromegaly or nodules palpated; no lymphadenopathy.  RESP  Decreased breath sounds in the bases w/o, wheezes/ rales/ or rhonchi.no accessory muscle use, no dullness to percussion  CARD:  RRR, no m/r/g  Trace peripheral edema, pulses intact, no cyanosis or clubbing.  GI:   Soft & nt; nml bowel sounds; no organomegaly or masses detected.  Musco: Warm bil, no deformities or joint swelling noted.   Neuro: alert, no focal deficits noted.    Skin: Warm, no lesions or rashes         Assessment & Plan:

## 2014-11-29 NOTE — Assessment & Plan Note (Signed)
Recent flare  Now resolving   Plan  Continue on current regimen . Follow up with Dr. Joya Gaskins  In 6-8 weeks and As needed

## 2014-12-21 ENCOUNTER — Telehealth: Payer: Self-pay | Admitting: Cardiology

## 2014-12-21 NOTE — Telephone Encounter (Signed)
  Patient has h/o CHF. He called noting a 14 lb weight gain over the last 10 days. He admits to dietary indiscretion with sodium. Reports medication compliance but has not been taking diuretics correctly. Has been taking metolazone at same time as lasix instead of 30 minutes prior. He denies dyspnea, orthopnea, PND and chest pain. Has associated LEE. He was instructed to take an extra lasix tablet today and advised to take metolazone 30 minutes before his am dose tomorrow. Also advised to avoid salt over the weekend. He is to call if any development of dyspnea. He was instructed to call the office Monday AM to arrange an appointment if no improvement.   Sanchez, Fred 12/21/2014 12:45 PM

## 2015-01-06 ENCOUNTER — Other Ambulatory Visit: Payer: Self-pay | Admitting: Licensed Clinical Social Worker

## 2015-01-06 ENCOUNTER — Ambulatory Visit (INDEPENDENT_AMBULATORY_CARE_PROVIDER_SITE_OTHER): Payer: Medicare Other | Admitting: Critical Care Medicine

## 2015-01-06 ENCOUNTER — Encounter: Payer: Self-pay | Admitting: Critical Care Medicine

## 2015-01-06 VITALS — BP 126/70 | HR 64 | Ht 71.0 in | Wt 285.0 lb

## 2015-01-06 DIAGNOSIS — J449 Chronic obstructive pulmonary disease, unspecified: Secondary | ICD-10-CM

## 2015-01-06 NOTE — Progress Notes (Signed)
   Subjective:    Patient ID: [name], male    DOB: [birth year], 81 y.o.   MRN: [id number]  HPI 01/06/2015 Chief Complaint  Patient presents with  . COPD    Breathing is unchanged. Reports SOB, wheezing and coughing. Denies chest tightness.  Dyspnea is better.  Not much cough.  No real wheeze. No real edema.  No f/c/s. Pt denies any significant sore throat, nasal congestion or excess secretions, fever, chills, sweats, unintended weight loss, pleurtic or exertional chest pain, orthopnea PND, or leg swelling Pt denies any increase in rescue therapy over baseline, denies waking up needing it or having any early am or nocturnal exacerbations of coughing/wheezing/or dyspnea. Pt also denies any obvious fluctuation in symptoms with  weather or environmental change or other alleviating or aggravating factors   Current Medications, Allergies, Complete Past Medical History, Past Surgical History, Family History, and Social History were reviewed in [place] record per todays encounter:  01/06/2015   Review of Systems  Constitutional: Negative.   HENT: Negative.  Negative for ear pain, postnasal drip, rhinorrhea, sinus pressure, sore throat, trouble swallowing and voice change.   Eyes: Negative.   Respiratory: Positive for cough and shortness of breath. Negative for apnea, choking, chest tightness, wheezing and stridor.   Cardiovascular: Negative.  Negative for chest pain, palpitations and leg swelling.  Gastrointestinal: Negative.  Negative for nausea, vomiting, abdominal pain and abdominal distention.  Genitourinary: Negative.   Musculoskeletal: Negative.  Negative for myalgias and arthralgias.  Skin: Negative.  Negative for rash.  Allergic/Immunologic: Negative.  Negative for environmental allergies and food allergies.  Neurological: Negative.  Negative for dizziness, syncope, weakness and headaches.  Hematological: Negative.  Negative for adenopathy. Does not  bruise/bleed easily.  Psychiatric/Behavioral: Negative.  Negative for sleep disturbance and agitation. The patient is not nervous/anxious.        Objective:   Physical Exam Filed Vitals:   01/06/15 0955 01/06/15 0956  BP:  126/70  Pulse:  64  Height: 5\' 11"  (1.803 m)   Weight: 285 lb (129.275 kg)   SpO2:  92%    Gen: Pleasant, well-nourished, in no distress,  normal affect  ENT: No lesions,  mouth clear,  oropharynx clear, no postnasal drip  Neck: No JVD, no TMG, no carotid bruits  Lungs: No use of accessory muscles, no dullness to percussion,distant BS  Cardiovascular: RRR, heart sounds normal, no murmur or gallops, no peripheral edema  Abdomen: soft and NT, no HSM,  BS normal  Musculoskeletal: No deformities, no cyanosis or clubbing  Neuro: alert, non focal  Skin: Warm, no lesions or rashes  No results found.        Assessment & Plan:  I personally reviewed all images and lab data in the [facility] system as well as any outside material available during this office visit and agree with the  radiology impressions.   Obstructive chronic bronchitis without exacerbation Copd gold C stable on spiriva Plan Cont spiriva and focus on volume control   [name] was seen today for copd.  Diagnoses and all orders for this visit:  Obstructive chronic bronchitis without exacerbation

## 2015-01-06 NOTE — Assessment & Plan Note (Signed)
Copd gold C stable on spiriva Plan Cont spiriva and focus on volume control

## 2015-01-06 NOTE — Patient Instructions (Signed)
No change in medications. Return in         4 months 

## 2015-01-06 NOTE — Patient Outreach (Signed)
  Assessment: CSW received call from client on 01/06/15.  CSW verified identity of client.  CSW spoke with client on 01/06/15 about current needs of client. Client said he has financial difficulty each month with financial obligations. He said that it was difficult to make his copay payments to his doctors. He said it was difficult to pay copays due on his prescribed medications since he uses so many prescribed medications.  CSW talked with client about trying to set up monthly budget and allowing money in this budget for rent, food , medications, and other monthly expenses.  He said he would try to follow budget but his money usually does not last long enough to meet all his monthly expenses. He said that sometimes his family has to help him financially to pay for some of his monthly expenses. He said he has new oxygen concentrator and also has a back up oxygen supply cannister in his home.  He has family support. He said his son helped in cooking some meals for him and this is very helpful. He said he is eating well.  He said he has some difficulty sleeping. He said his family members help transport him to scheduled medical appointments as needed.  He sees Dr. Harrington Challenger as primary physician. He is trying to attend all medical appointments scheduled. CSW encouraged client to call CSW at [phone] as needed to address ongoing social work issues of client.  Plan: Client to take medications as prescribed and attend scheduled medical appointments. Client to talk with Dr. Harrington Challenger regarding medical needs of client. Client to develop monthly budget and try to follow monthly budget to manage monthly expenses of client. CSW to call client in 4 weeks to assess needs of client at that time.  Norva Riffle.Dorethia Jeanmarie MSW, LCSW Licensed Clinical Social Worker [facility] Care Management [phone]

## 2015-01-06 NOTE — Patient Outreach (Signed)
  Assessment:  CSW completed chart review on client on 01/06/15.  CSW called home phone number of client on 01/06/15. CSW was not able to speak via phone with client but CSW did leave phone message for client on 01/06/15 requesting that client please return call to CSW at [phone] to discuss current needs of client. CSW did call phone number for [place], [place], son and emergency contact for client, on 01/06/15 and spoke via phone with son of client. CSW verified identity of [place], [place] said that client was eating well and had his prescribed medications.  [name] said that client used oxygen as prescribed (at 2.5 liters per minute continuously). [name] said that client used oxygen as prescribed in the home and that client used oxygen as prescribed to help client sleep. [name] said that client does still drive his car occasionally for very short trips in the community.  [name] said that client has normal appetite and had not lost weight recently.  Client has routine medical appointment with Dr. Asencion Noble, pulmonologist in [place], [place], on 01/06/15.  [name], son of client, said he was not aware of any particular nursing needs of client at this time.  CSW verified that son of client also had phone number of CSW to utilize as needed for client support.  CSW reminded son of client that client and son of client can call CSW as needed to address clinical social work issus facing client. CSW thanked son of client for phone call on 01/06/15.   Plan: Client to take medications as prescribed and to attend scheduled medical appointments.  Client to communicate with Dr. Harrington Challenger, as needed, to discuss medical needs of client.   CSW to call client in three weeks to assess needs of client at that time.  Norva Riffle.Cassondra Stachowski MSW, LCSW Licensed Clinical Social Worker [facility] Care Management [phone]

## 2015-01-08 ENCOUNTER — Other Ambulatory Visit: Payer: Self-pay | Admitting: Licensed Clinical Social Worker

## 2015-01-08 NOTE — Patient Outreach (Signed)
Assessment:  CSW received phone call from client on 01/08/15.  CSW verified identity of client.  CSW and [place] spoke of current needs of client. Client reported that he had gone to appointment with Dr. Asencion Noble, pulmonologist, this week and that appointment with Dr. Joya Gaskins went well.  Client reported that his son prepares meals for him at home and this is very helpful to client. Client did say that his son works in [place], [place] two days per week.  Client said he uses oxygen as prescribed in the home environment.  Client said he uses oxygen as prescribed continuously. He said he has a back up oxygen cylinder in his home to utilize if needed. CSW and client spoke of community resources that may be available to assist client at present. He said he struggles with obtaining monthly prescribed medications. He said that on occasion his family has to help him buy his prescribed monthly medications.  He said he is attending scheduled medical appointments and has no transportation needs at present.  CSW encouraged client to call CSW at [phone] to address social work issues faced by client.  Client was appreciative of phone conversation with CSW on 01/08/15.  Plan: Client to take medications as prescribed and to attend scheduled medical appointments. Client to communicate with Dr. Harrington Challenger, as needed, to discuss medical needs of client. CSW to call client in three weeks to assess needs of client at that time.  Norva Riffle.Telina Kleckley MSW, LCSW Licensed Clinical Social Worker [facility] Care Management [phone]

## 2015-03-04 ENCOUNTER — Encounter: Payer: Self-pay | Admitting: Licensed Clinical Social Worker

## 2015-03-04 ENCOUNTER — Other Ambulatory Visit: Payer: Self-pay | Admitting: Licensed Clinical Social Worker

## 2015-03-04 NOTE — Patient Outreach (Signed)
Assessment: CSW completed chart review on client on 03/04/15. CSW called home phone number of client on 03/04/15.  CSW verified identity of client.  CSW and client spoke via phone on 03/04/15 about the current needs of client. Client said he is using oxygen 24/7 in the home to help him breath. He uses oxygen via nasal canula at 2 liters (continuous). He said he uses oxygen 24/7 at present.  He uses oxygen to sleep at night.   He said he has a portable canister of oxygen he uses when he leaves his home.  He said he saw Dr. Harrington Challenger two months ago. He said he is seen by a pulmonary doctor and he thinks he  will go back to pulmonary doctor in two months for an appointment.  He said he is scheduled for cataract surgery on his left eye in August of 2016 and is scheduled to have cataract surgery on his right eye in September of 2016.  His son is supportive.  Client said that his son helps him go to and from scheduled appointments for client.  Client said he is sleeping adequately.  Client said he has adequate food supply.  Client said he likes to walk outdoors around his home or in his neighborhood. Client has support from his daughter.  Client said his medications have been affordable. He said he has his prescribed medications.  Client said that Dr. Harrington Challenger does review client's medications with client on each client visit to see Dr. Harrington Challenger.  Client said he also periodically has appointment with cardiologist. He said he is waiting on cardiologist office representative to call him to schedule his next cardiologist appointment. He said he watches TV to relax.  He said he has  friends to visit him regularly. He said he has strong family support. CSW informed [place] on 03/04/15 that client had met care plan goals set for client.  CSW informed [place] that [place] would discharge client, close case to client with [facility] CSW services on 03/04/15 since client had met his goals set. Client agreed to this plan.  CSW again gave [place] [facility] CSW number  of [phone].  CSW reminded [place] that if, in future months, Rebel thought he again needed [facility] program support, that Rilan could discuss this issue with Dr. Harrington Challenger to see if Dr. Harrington Challenger wanted to re-refer client to [facility] program support. Sa[place]as said he understood this information. Didier was very appreciative of support of [facility] program staff in recent months.  Plan: CSW is discharging [place] [place] from [place] on 03/04/15 since client has met all goals set for client. CSW to inform Lurline Del on 03/04/15 that [place] discharged client on 03/04/15 due to fact that client had met all goals set. CSW sent physician case closure letter to Dr. Harrington Challenger on 03/04/15 informing Dr. Harrington Challenger that client had met goals set and [facility] CSW had closed case on client on 03/04/15.  Norva Riffle.Tavarious Freel MSW, LCSW Licensed Clinical Social Worker [facility] Care Management [phone]

## 2015-03-07 NOTE — Patient Outreach (Signed)
[facility]) Care Management  03/04/2015  [name] [birth year] [id number]   Notification from [name], LCSW to close case due to goals met with [name].  Thanks, [name]. [name], [name] Assistant Phone: [phone] Fax: [phone]

## 2015-03-19 ENCOUNTER — Other Ambulatory Visit: Payer: Self-pay | Admitting: Critical Care Medicine

## 2015-03-26 DIAGNOSIS — R079 Chest pain, unspecified: Secondary | ICD-10-CM | POA: Insufficient documentation

## 2015-05-01 ENCOUNTER — Other Ambulatory Visit: Payer: Self-pay | Admitting: Cardiovascular Disease

## 2015-06-09 ENCOUNTER — Other Ambulatory Visit: Payer: Self-pay | Admitting: Cardiovascular Disease

## 2015-06-09 NOTE — Telephone Encounter (Signed)
He is supposed to receive both lasix and zaroxolyn however he needs to call and make an appointment.  He is overdue for his follow-up.  Please do not refill for more than #30 and advise patient needs appointment.  Thank you.

## 2015-06-09 NOTE — Telephone Encounter (Signed)
Thayer Headings, MD at 06/19/2014 12:13 PM  furosemide (LASIX) 40 MG tabletTAKE ONE AND ONE-HALF TABLETS BY MOUTH TWICE DAILY Patient Instructions     Your physician recommends that you have lab work: [place] metabolic panel  Your physician recommends that you continue on your current medications as directed. Please refer to the Current Medication list given to you today.  Your physician wants you to follow-up in: 6 months with Dr. Acie Fredrickson. You will receive a reminder letter in the mail two months in advance. If you don't receive a letter, please call our office to schedule the follow-up appointment.

## 2015-06-19 ENCOUNTER — Other Ambulatory Visit: Payer: Self-pay

## 2015-06-19 MED ORDER — FUROSEMIDE 40 MG PO TABS: 60.0000 mg | ORAL_TABLET | Freq: Two times a day (BID) | ORAL | Status: AC

## 2015-07-07 ENCOUNTER — Other Ambulatory Visit: Payer: Self-pay | Admitting: Cardiovascular Disease

## 2015-09-13 ENCOUNTER — Other Ambulatory Visit: Payer: Self-pay | Admitting: Cardiovascular Disease

## 2015-09-15 ENCOUNTER — Telehealth: Payer: Self-pay

## 2015-09-16 NOTE — Telephone Encounter (Signed)
Patient should schedule appointment for further refills

## 2015-09-18 ENCOUNTER — Other Ambulatory Visit: Payer: Self-pay | Admitting: Physician Assistant

## 2015-09-19 NOTE — Telephone Encounter (Signed)
Ok to refill Hydralazine 25 mg 1 tab TID? Last OV 07/09/14. Pt still has not scheduled follow up appointment.

## 2015-09-25 NOTE — Telephone Encounter (Signed)
Called and left generic message with just our phone number and to give [place] a call back for patient to give [place] a call back. He needs a refill of Ferrex but needs appointment first.

## 2015-10-17 ENCOUNTER — Other Ambulatory Visit: Payer: Self-pay

## 2015-10-17 VITALS — BP 126/80 | Ht 69.0 in | Wt 281.0 lb

## 2015-10-17 DIAGNOSIS — I5032 Chronic diastolic (congestive) heart failure: Secondary | ICD-10-CM

## 2015-10-17 DIAGNOSIS — Z9181 History of falling: Secondary | ICD-10-CM

## 2015-10-17 DIAGNOSIS — R296 Repeated falls: Secondary | ICD-10-CM | POA: Insufficient documentation

## 2015-10-17 NOTE — Patient Outreach (Signed)
[name] [facility]) Care Management  10/17/2015  [name] 08/13/1930 PO:[id number]   Referral Date:  10/15/2015 Source:  Dr. Harrington Challenger Issue:  CHF  Subjective:  RN CM noted that patient very short of breath on answering the phone. Patient stated he is always short of breath and can hardly walk across the floor to another room most of the time.  States he has no knowledge of having DM2.  Patient states his hands and feet are swollen most of the time.  States he weighs daily but does not document his weight.  States weight is down in the 270's since last recorded at 281 lbs on 10/10/2015.  Providers: Primary MD: Dr. Harrington Challenger  -  last appt:  10/10/2015   next appt:  Next week. 09/2015 Cardiologist:  Pulmonologist: Previously Dr. Joya Gaskins but is not sure who he is assigned to see since Dr. Joya Gaskins is no longer with the practice.  HH: None  Insurance:  UHC/AARP Medicare   Social: Patient lives in his home with his son and 74 yo granddaughter.  Son works during the day and patient is home alone.  Patient is HOH and needs for phone contact to speak loudly.   Last hearing exam about 10 years ago.  Wears Bilateral hearing aids.     Mobility: Ambulates with extra wide walker (due to patient's size) inside the house and using a cane outside the home.  Patient stated issues with getting walker outside but was unable to state specifically what the issue was.  RN CM confirmed the walker fits through the door.    Falls:  States he has falls everyday and has injuries to his arms and legs frequently.   Pain: yes back pain Transportation:  Son, daughter, neighbor.  Patient No longer able to drive.  Caregiver: Son, daughter (states daughter works at [employer]) Forensic scientist: patient states he is not sure what all he has and unable to confirm if he has ever completed Forensic scientist.  Resources:  [employer] - $1,400/month.   MD visit co-pay issues:  "sometimes I can pay and sometimes I can't."  Consent:   Patient provided verbal consent for [facility] services and permission to discuss his health care needs and management with son or daughter as needed.  DME:  Angela Burke (oversized), Home oxygen 24/7 2 L/m ([facility]), Scales, Eyeglasses.  Patient does not have a home BP cuff.       THN conditions:  Chronic Diastolic Heart Failure, DM2 (patient states he is not aware of DM), COPD Other:  HTN, Chronic Respiratory failure, Chronic Kidney Disease 3, Obesity. Admissions: 0 ER visits: 0 H/o last admission date:   10/04/2014 - 10/13/2014  COPD exacerbation.   Medications:  Patient taking more than 15 medications  Co-pay cost issues:  None - $3:00-$5:00  States issues with getting refills; "It is hard to get my medicine but Dr. Harrington Challenger has been trying to help me by writing the prescriptions until I can see my doctors (Cardiologist/Pulmonologist) again.  I only see my heart doctor every 6 months but sometimes my refills run out before that appointment date."   Son prepares medication box for patient.  Flu Vaccine: 10/08/14 Pneumonia Vaccine:  PCV13 02/04/14 and PPSV2 10/08/14 Pharmacy:   [facility] [place] Medication Issue:  States "MD has ordered a muscle relaxer for back pain but I can hardly stand up when I am taking it."    Assessment: RN CM discussed risk versus benefit of taking  muscle relaxer and risk of falls.  Patient agreed the benefit is not worth the risk and elects to stop taking this medication.   Respiratory status complicated by multiple co-morbidities.  Patient would benefit from [facility]    Plan:  [facility] RN CM Referral  -Fall Risk, Recent fall and long history of falls -Multi co-morbidities -no home BP cuff  THN Social Work Referral -Financial assessment:  Issues affording MD Specialty visits.  -Advanced Directive:  Patient unable to recall if he has completed.   [facility] Referral -more than 15 medications.  -Medication Issue:  States "MD has  ordered a muscle relaxer for back pain but I can hardly stand up when I am taking it."    RN CM advised patient in next [facility] scheduled contact call within the next 10 business days. RN CM advised to please notify MD of any changes in condition prior to scheduled appt's.   RN CM provided contact name and # [phone] or main office # [phone] and 24-hour nurse line # [phone].  RN CM confirmed patient is aware of 911 services for urgent emergency needs.  Mariann Laster, RN, BSN, [facility], CCM  [employer] Management Coordinator [phone] Direct [phone] Cell [phone] Office [phone] Fax

## 2015-10-19 ENCOUNTER — Other Ambulatory Visit: Payer: Self-pay | Admitting: Cardiovascular Disease

## 2015-10-20 ENCOUNTER — Other Ambulatory Visit: Payer: Self-pay | Admitting: *Deleted

## 2015-10-20 NOTE — Patient Outreach (Signed)
Referral received for evaluation. [name] is well known to me from previous involvement. I have called him today. He did not answer and I left a message for him to call me back.  Deloria Lair [facility] [phone]

## 2015-10-24 ENCOUNTER — Other Ambulatory Visit: Payer: Self-pay | Admitting: Licensed Clinical Social Worker

## 2015-10-24 ENCOUNTER — Encounter: Payer: Self-pay | Admitting: Licensed Clinical Social Worker

## 2015-10-24 NOTE — Patient Outreach (Signed)
Assessment:  CSW received referral on Ramzy D. Juleen China. CSW completed a chart review on client on 10/24/15.  Client has also been referred to Deloria Lair, geriatric nurse practitioner, for nursing support for client.  Client sees Dr. Harrington Challenger as primary care physician. CSW has worked previously with this particular client.  Kiaeem has some family support. He lives at his home and uses oxygen 24/7. He does have mobility issues and has experienced several falls in the past year. He has his prescribed medications and is taking medications as prescribed. CSW called client on 10/24/15 and spoke via phone with client. CSW verified client identity. CSW received verbal permission from client on 10/24/15 for CSW to speak with client about current needs of client.  Of note, since client has previously been in [facility] program support, client has already previously completed [facility] consent form. CSW and client completed needed [facility] assessments. Client said he fatigues easily; but client is using oxygen as prescribed 24/7.  Client has family support.  Client said he sometimes has a dry mouth and said he sometimes becomes dizzy upon standing.  He said he feels that he falls to floor several times per month. He has  carpeted floors and has cleared paths for him to walk.  CSW encouraged client to talk with Benard Rink about nursing needs of client. CSW encouraged client to talk with Dr. Harrington Challenger about falls of client, dizziness of client,and shortness of breath of client. . Client said he has difficulty hearing.  Client said he does have a hearing aid but sometimes he does not hear that well when using hearing aid.  Client said he is incontinent often. He said he fatigues and becomes short of breath when walking from room to room.  He said he often has small bruises on his legs or arms from where he has slipped down or had a fall on his carpeted floor at his home.  He said he appreciated [facility] program support and looked forward to working with [facility]  staff for support.  CSW and client spoke of client care plan. Client has financial challenges. Client agreed to goal of his developing a basic budget to pay monthly bills and to follow this budget developed for next 30 days. CSW encouraged client to develop a basic budget and follow this budget for next 30 days. Client did say it is often difficult for him to pay copay amounts due at his medical doctors' visits.  CSW thanked client for phone conversation with CSW on 10/24/15. CSW encouraged client to call CSW at [phone] to discuss social work needs of client.   Plan:  Client to develop a monthly budget and follow monthly budget in next 30 days to help him pay bills in timely manner. CSW to collaborate with Deloria Lair, geriatric nurse practitioner, to monitor needs of client. CSW to call client in two weeks to assess needs of client at that time.  Norva Riffle.Iden Stripling MSW, LCSW Licensed Clinical Social Worker [facility] Care Management [phone]

## 2015-10-25 IMAGING — US US RENAL
1 series · 14 of 25 positions shown · non-contrast
Comparison: None.

CLINICAL DATA: Worsening renal insufficiency. Diabetes and
hypertension.

EXAM:
RENAL/URINARY TRACT ULTRASOUND COMPLETE

[Series 1: us renal · 0.31mm/px · 14 of 50 slices shown]
[im 1/50]
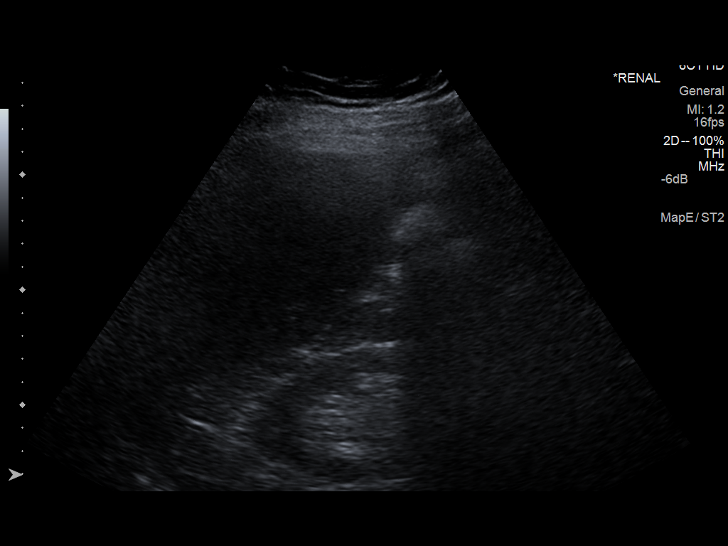
[im 5/50]
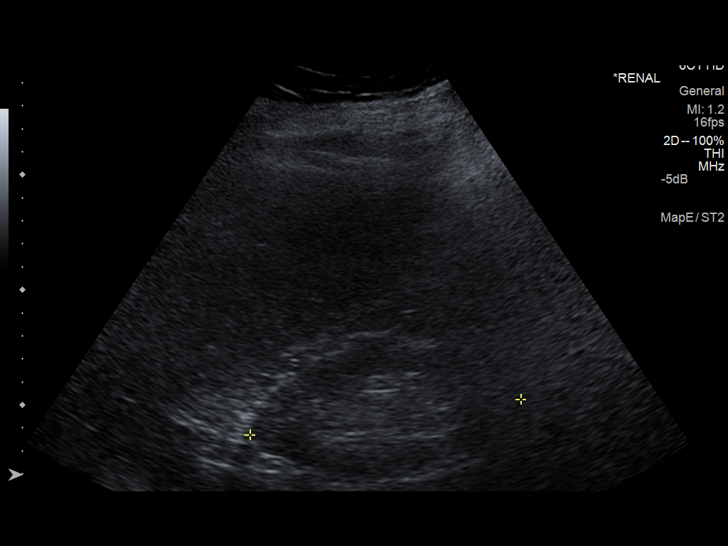
[im 9/50]
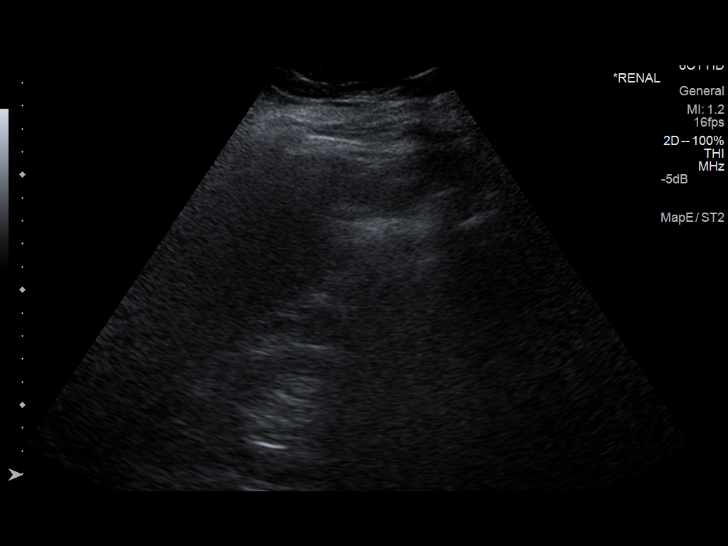
[im 13/50]
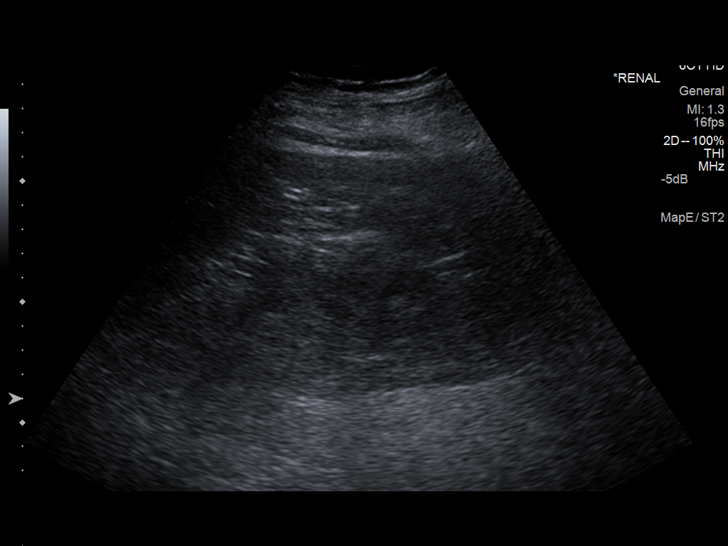
[im 17/50]
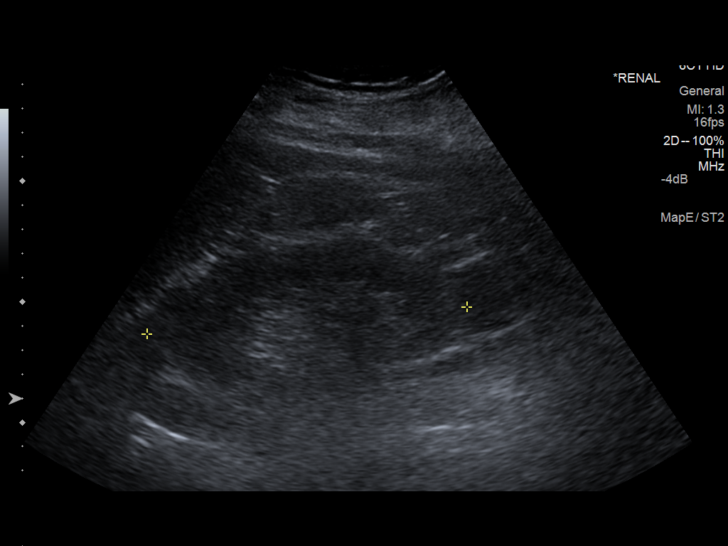
[im 19/50]
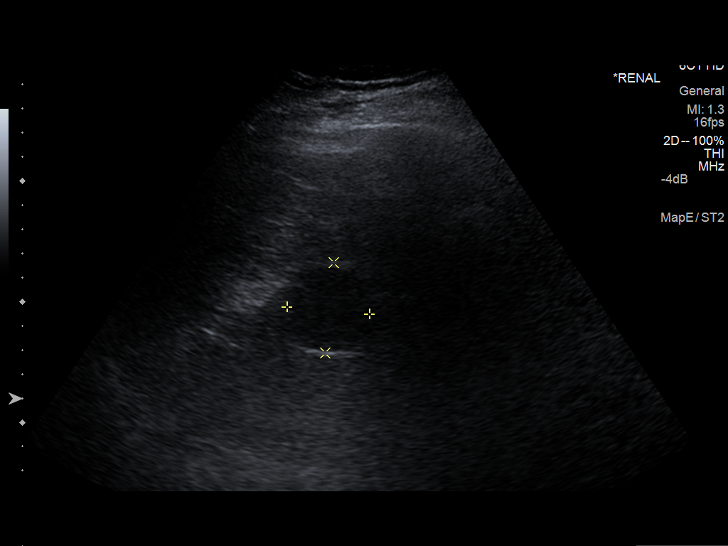
[im 23/50]
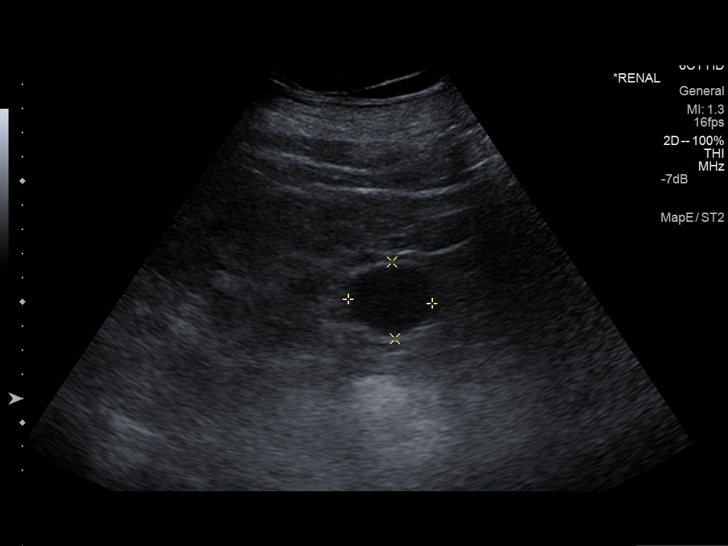
[im 27/50]
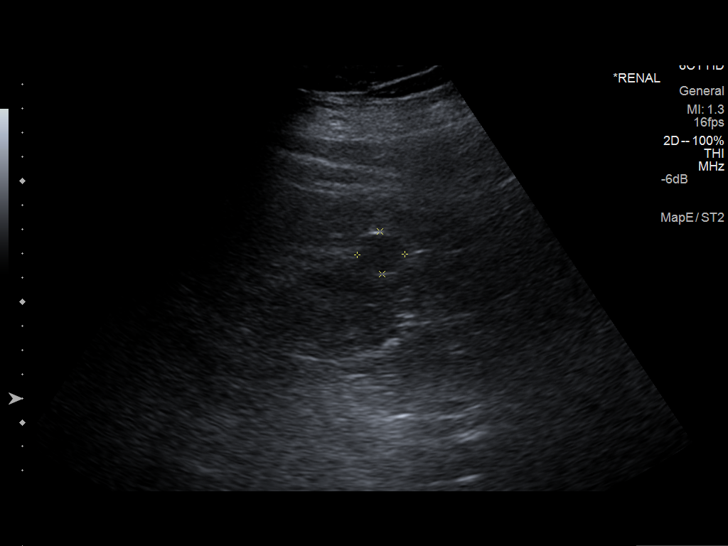
[im 31/50]
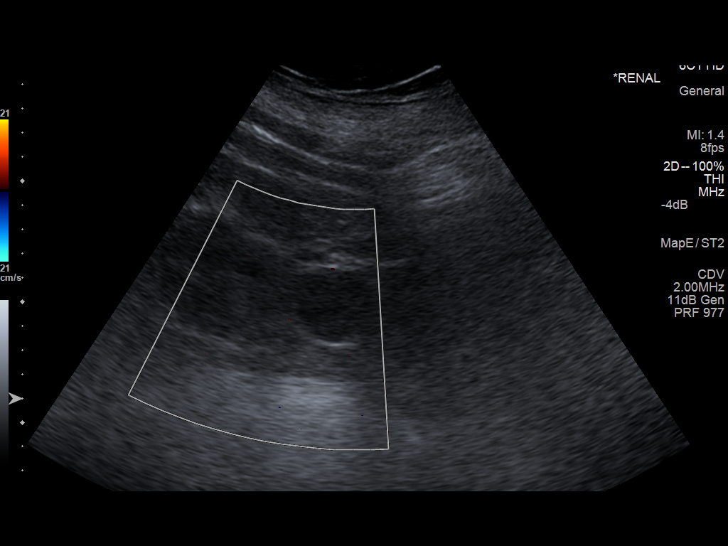
[im 33/50]
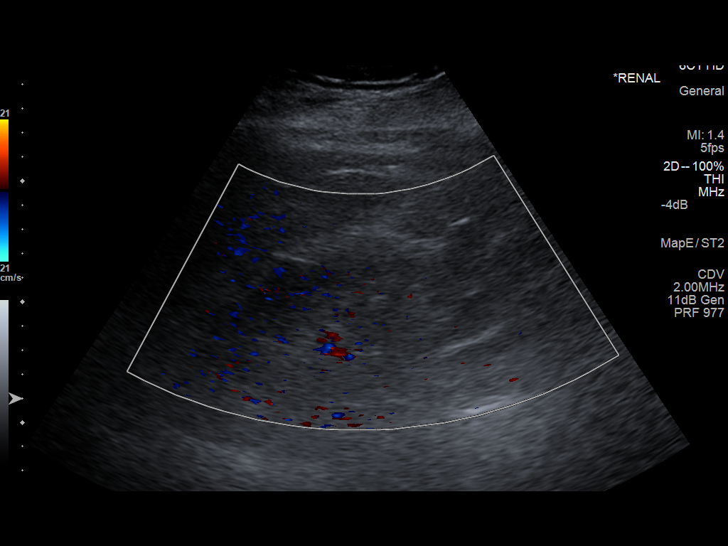
[im 37/50]
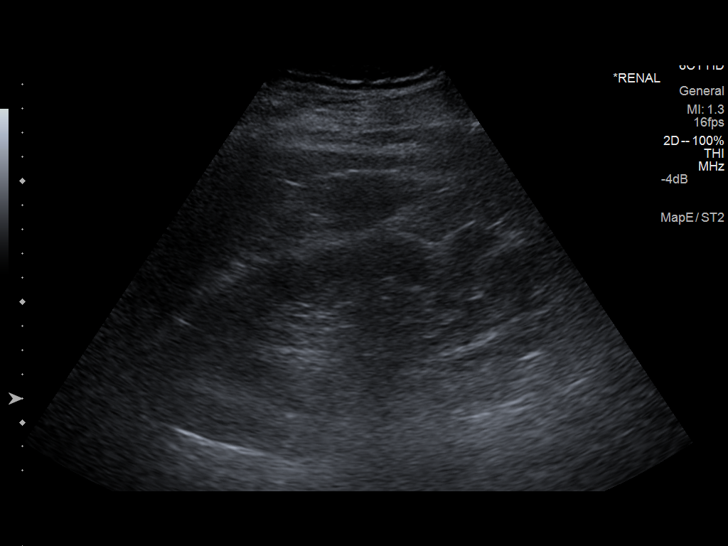
[im 41/50]
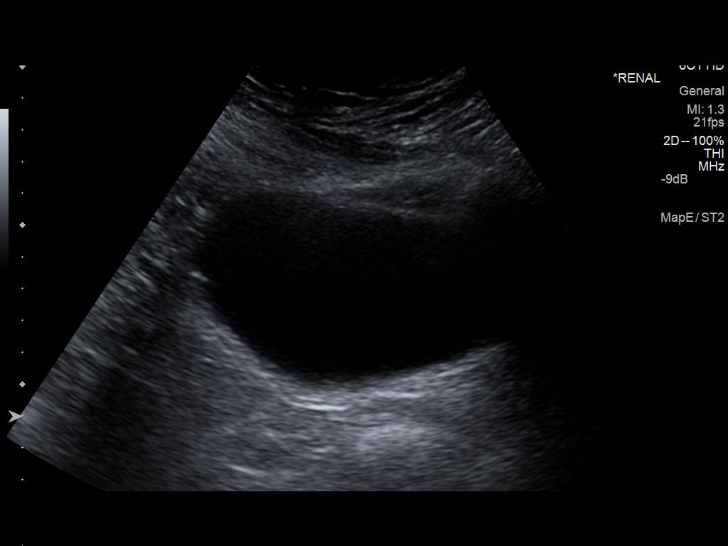
[im 45/50]
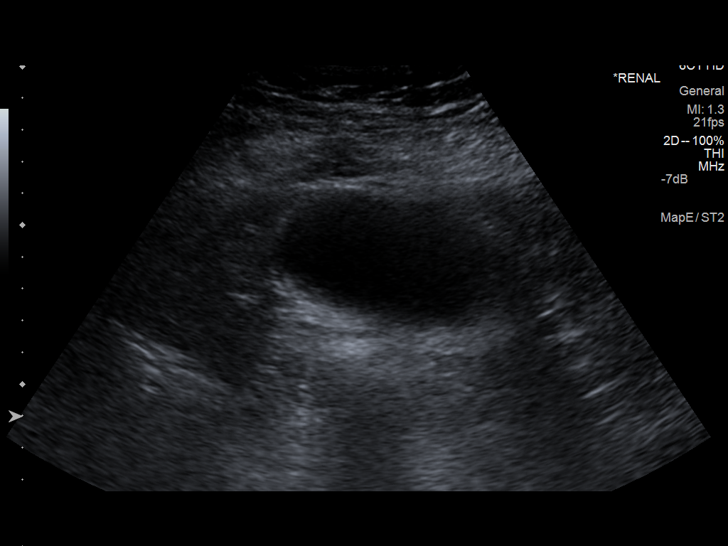
[im 50/50]
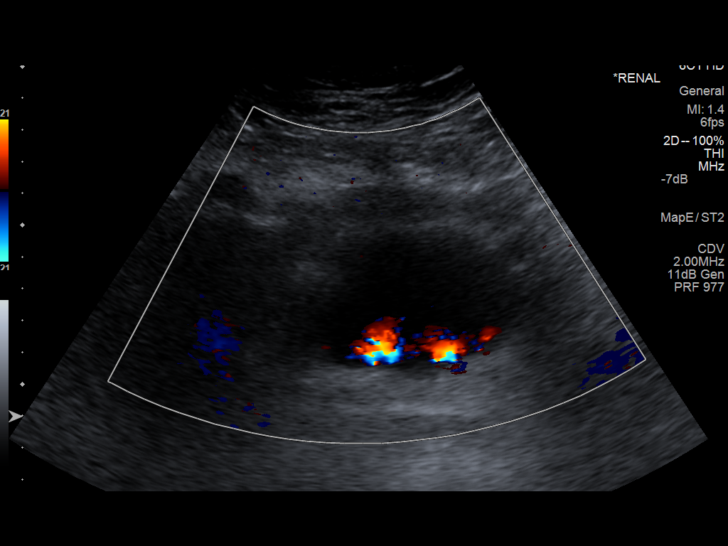

[14 of 25 positions shown; findings below may reference images not displayed]

FINDINGS: Right Kidney:

Length: 11.9 cm. Suboptimally visualized due to large patient
habitus. Echogenicity within normal limits. No mass or
hydronephrosis visualized.

Left Kidney:

Length: 13.3 cm. Suboptimally visualized due to large patient
habitus. Echogenicity within normal limits. Several cysts noted,
largest measuring 3.5 cm. No definite mass or hydronephrosis
visualized.

Bladder:

Appears normal for degree of bladder distention.
IMPRESSION: Technically difficult exam.  No evidence of hydronephrosis.

## 2015-10-25 IMAGING — CR DG CHEST 2V
2 series · 2 of 2 positions shown · non-contrast
Comparison: 07/02/2013

CLINICAL DATA: Cough, chest congestion, shortness of breath,
hypertension, COPD, coronary artery disease, asthma

EXAM:
CHEST  2 VIEW

[w chest pa]
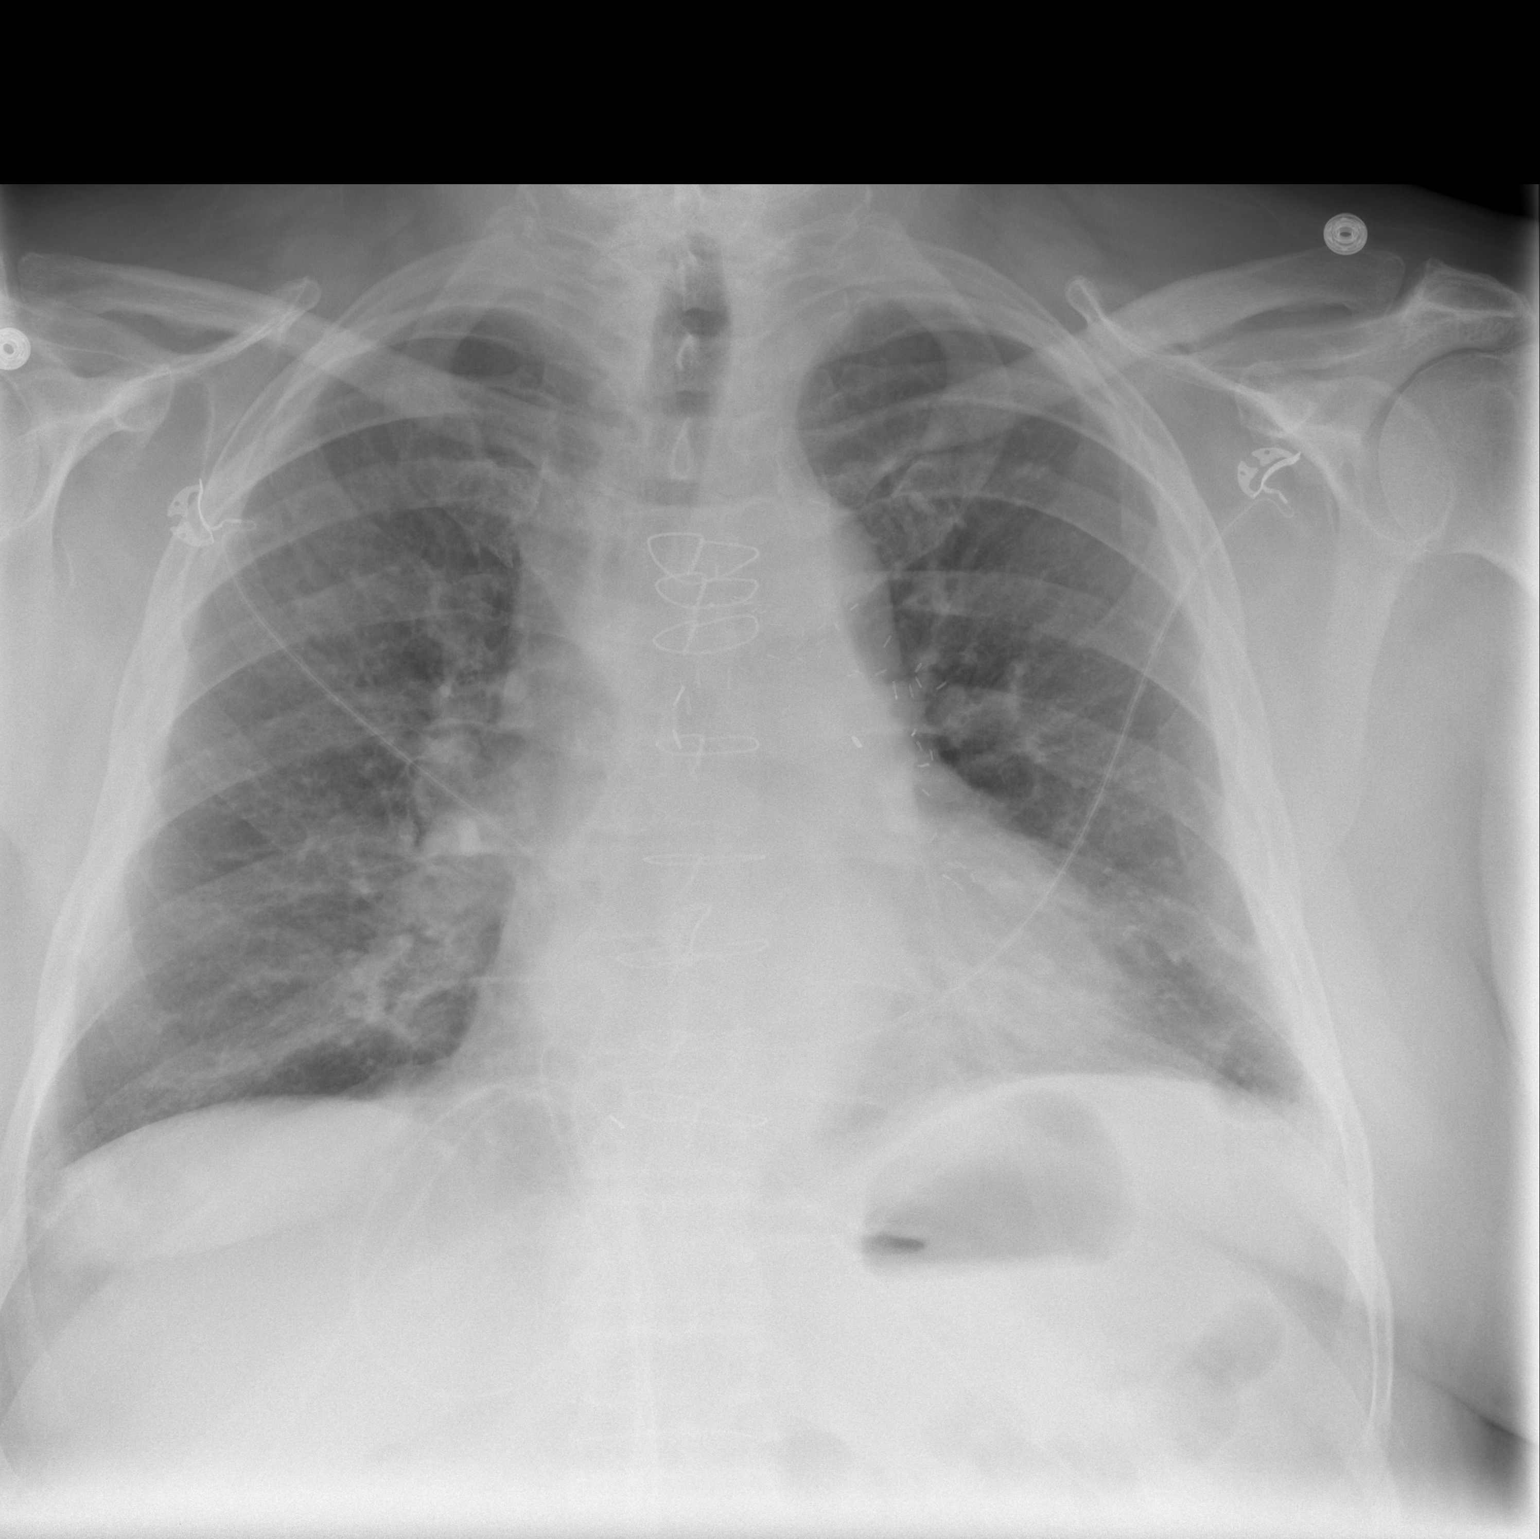

[w chest lat *]
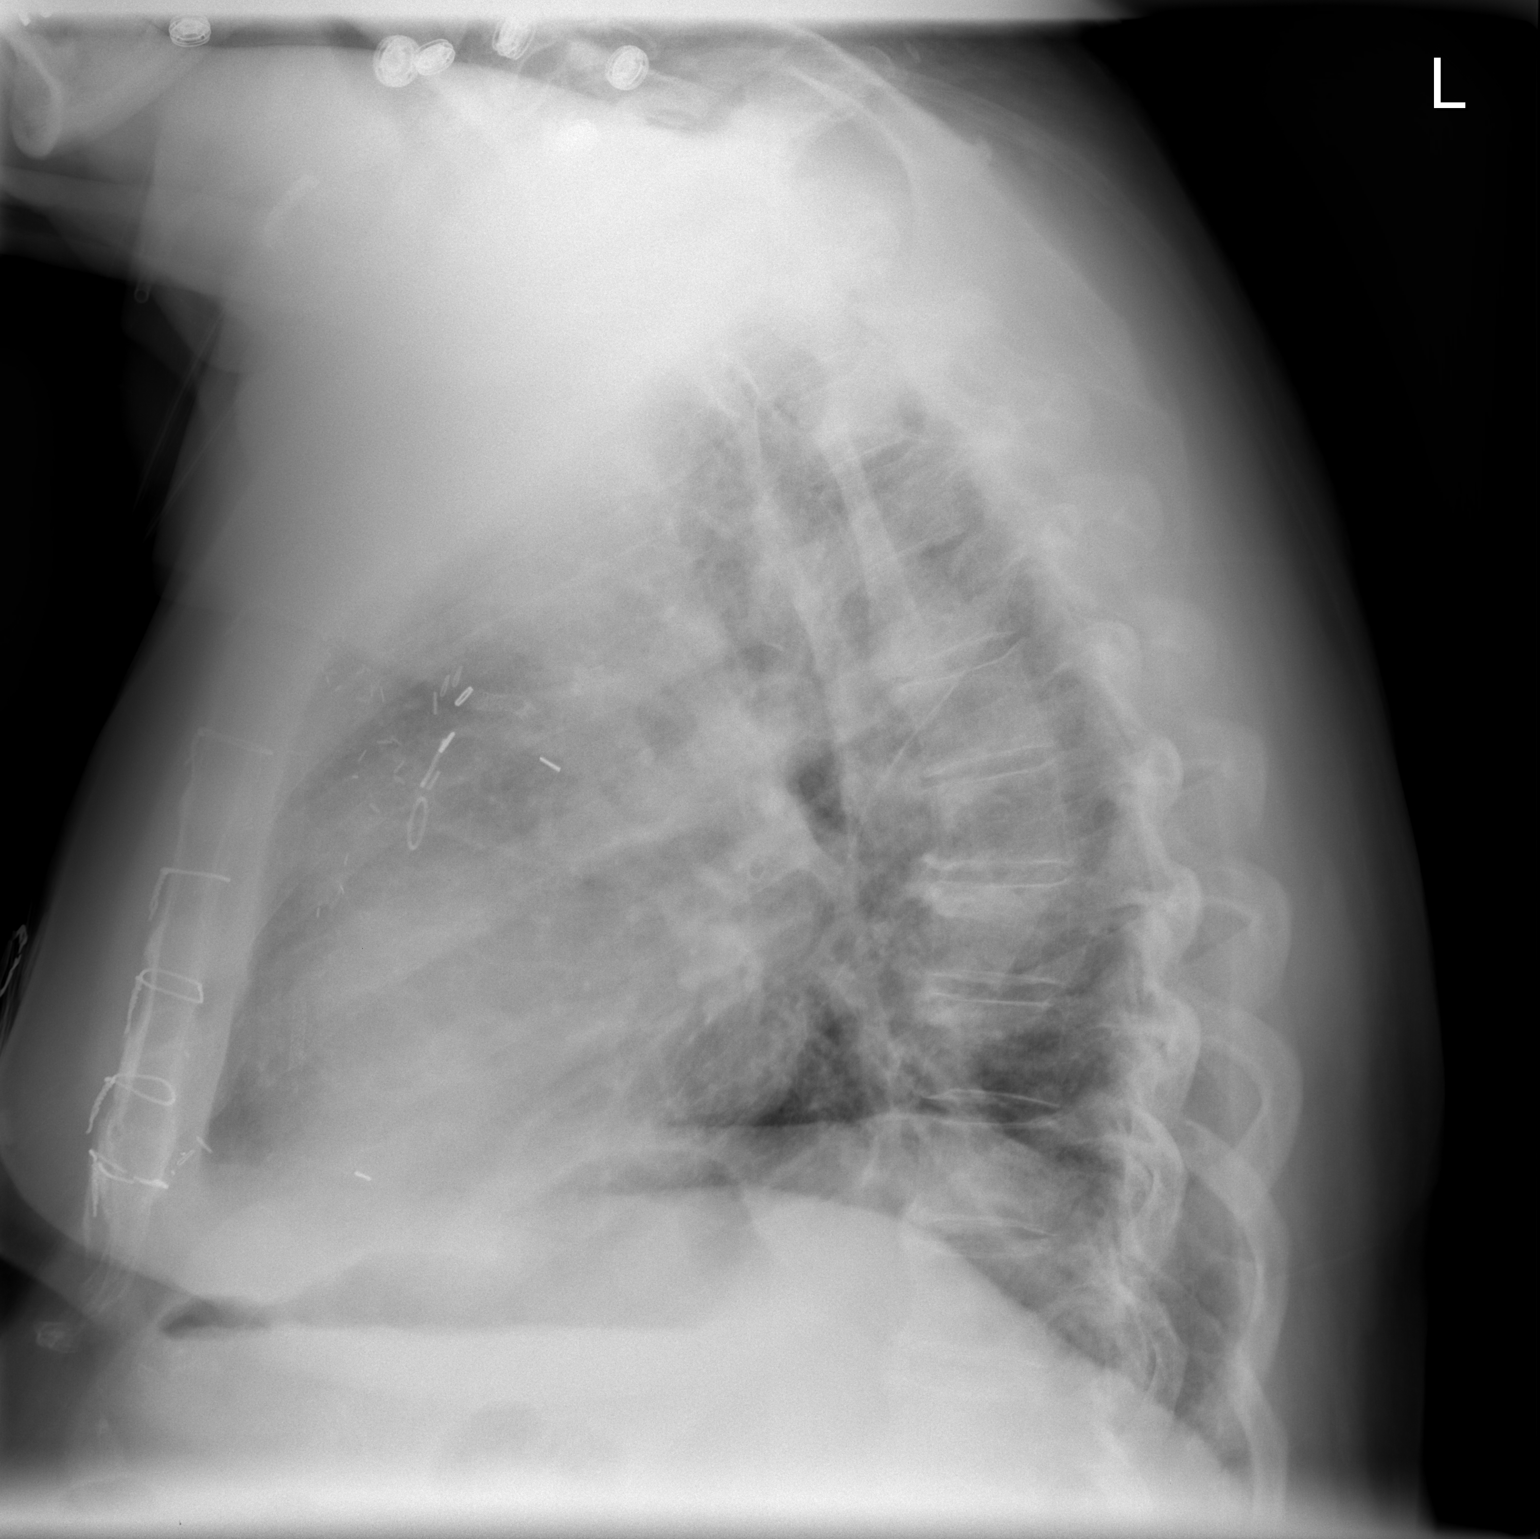

[2 of 2 positions shown; findings below may reference images not displayed]

FINDINGS: Minimal enlargement of cardiac silhouette post CABG.

Tortuous aorta.

Pulmonary vascularity normal.

Lungs clear.

Mild peribronchial thickening.

No pleural effusion or pneumothorax.

Bones unremarkable.
IMPRESSION: Enlargement of cardiac silhouette post CABG.

Mild bronchitic changes.

## 2015-10-27 NOTE — Patient Outreach (Signed)
Addendum: Pt return my call on 10/23/15. We discussed his strength and endurance, general safety, fall frequency. I do not think my face to face involvement would necessarily be of benefit to him. We did discuss the possibility of a motorized wheelchair to assist him with his mobility. We also discussed the possibility of him moving to a retirement faciliity. He was not totally opposed to this. I will request that Dr. Harrington Challenger consider and request an electric wheelchair evaluation for him. I will touch base with him again this week.  Deloria Lair [facility] [place] [phone]

## 2015-10-29 ENCOUNTER — Other Ambulatory Visit: Payer: Self-pay | Admitting: Pharmacist

## 2015-10-29 NOTE — Patient Outreach (Signed)
[name]  10/29/2015  [name] 1930/09/07 FO:[id number]   Trigo Balakrishnan is a 82yo who was referred to [place] for medication review.  I made outreach call to patient to review his medications with him.  There was no answer.  I left a HIPAA compliant voicemail for patient to return my phone call.  I will make a second outreach attempt within one week if patient does not return my phone call.    Elisabeth Most, Pharm.D. Pharmacy Resident [place] [phone]

## 2015-11-02 IMAGING — CR DG CHEST 2V
2 series · 2 of 2 positions shown · non-contrast
Comparison: July 03, 2013.

CLINICAL DATA: Dyspnea and chest pain.

EXAM:
CHEST  2 VIEW

[w chest pa]
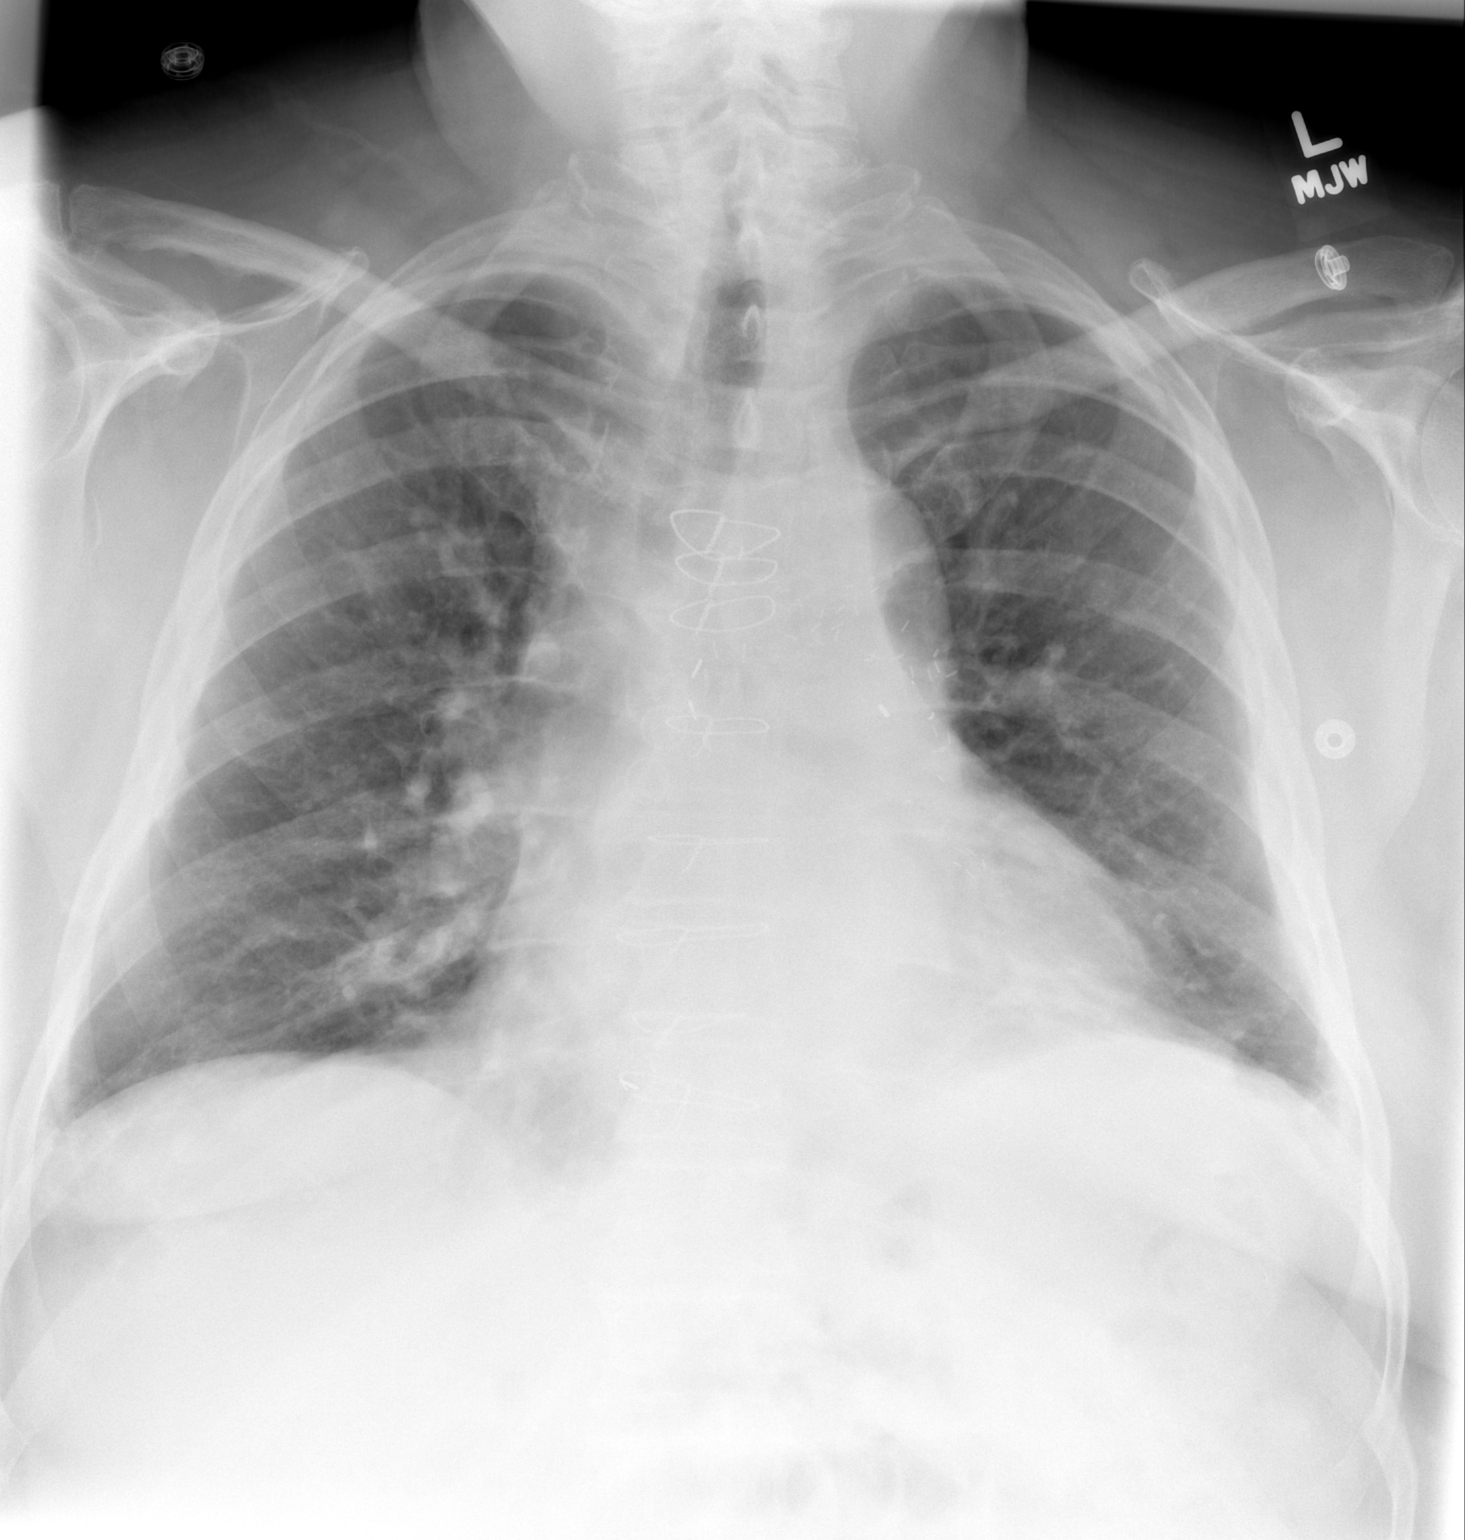

[w chest lat]
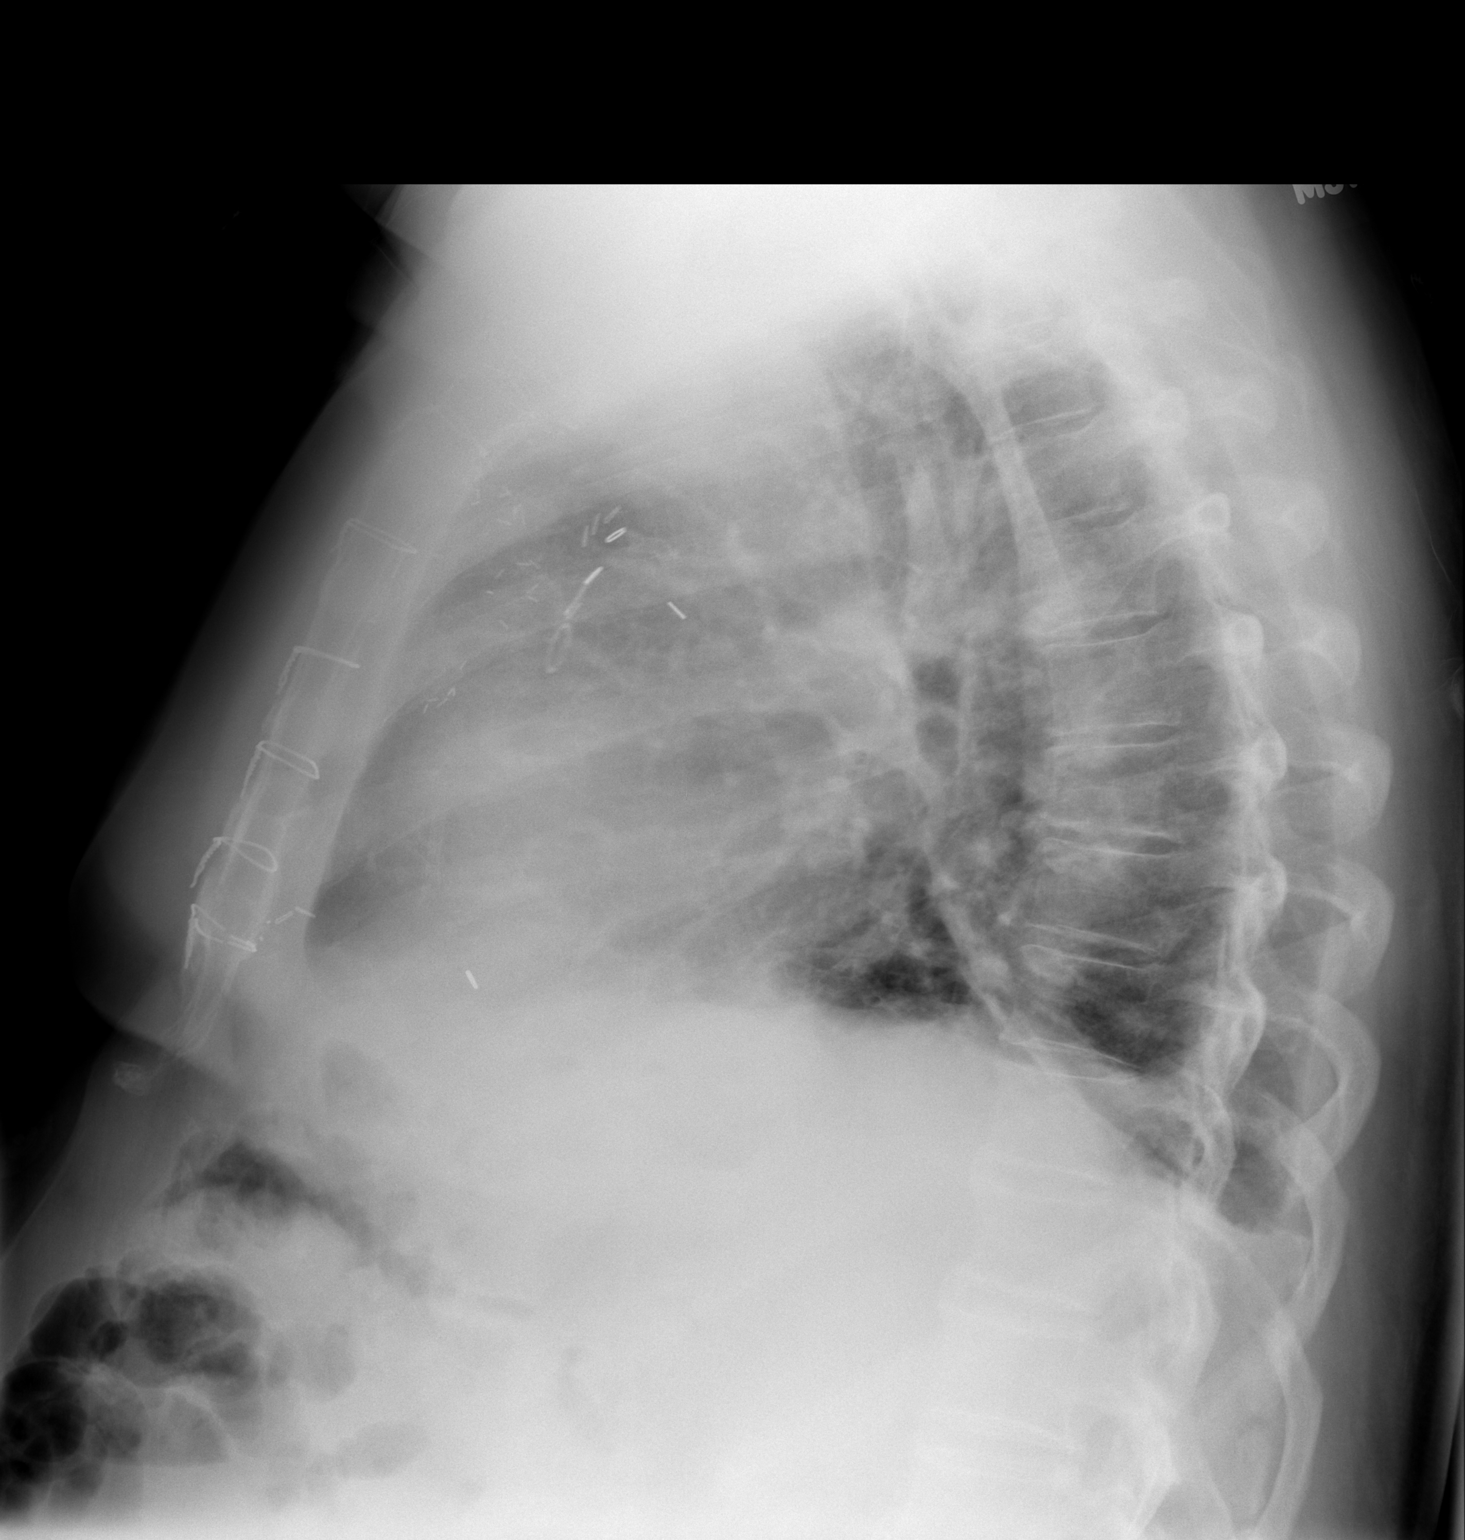

[2 of 2 positions shown; findings below may reference images not displayed]

FINDINGS: The lungs are well-expanded. The interstitial markings are coarse
similar to those seen previously. Increased density just above the
dome of the left hemidiaphragm is present today and may reflect
atelectasis or developing infiltrate. The cardiopericardial
silhouette is mildly enlarged. The patient has undergone previous
CABG. The pulmonary vascularity is not engorged. There is no
significant pleural effusion. The observed portions of the bony
thorax exhibit no acute abnormality.
IMPRESSION: 1. There may be developing atelectasis or pneumonia at the left lung
base likely posteriorly.
2. Low-grade compensated CHF is suspected little changed from the
previous study.
.

## 2015-11-02 IMAGING — CR DG CHEST 1V PORT
1 series · 1 of 1 positions shown · non-contrast
Comparison: PA and lateral chest x-ray 11 July, 2013.

CLINICAL DATA: New central line placement, dyspnea

EXAM:
PORTABLE CHEST - 1 VIEW

[AP]
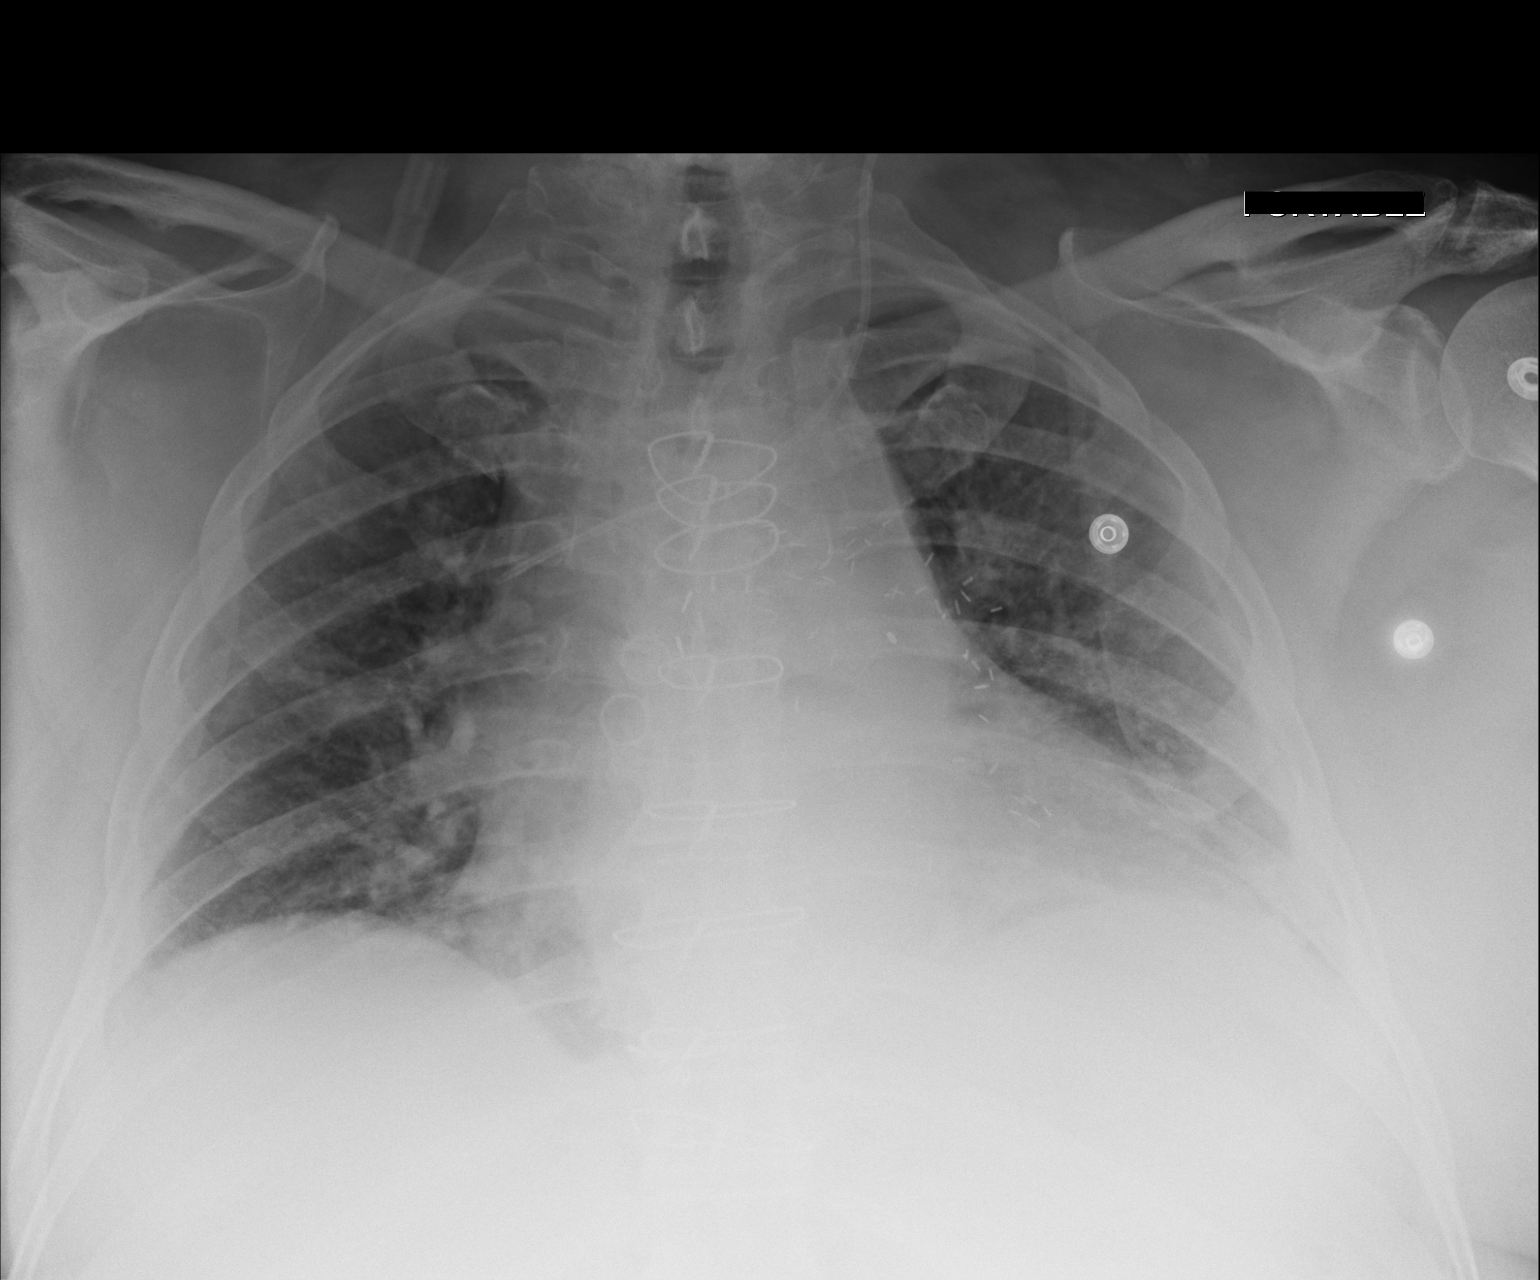

[1 of 1 positions shown; findings below may reference images not displayed]

FINDINGS: The lungs are less well inflated on this current portable study. The
patient has undergone placement of a left internal jugular venous
catheter. The tip appears to lie at the junction of the right and
left brachiocephalic veins. There is no evidence of a postprocedure
complication. The cardiopericardial silhouette is enlarged. The
central pulmonary vascularity is prominent without evidence of
cephalization. There is no significant pleural effusion
demonstrated. The patient has undergone previous CABG.
IMPRESSION: There is no evidence of a postprocedure complication following
placement of the left internal jugular venous catheter.

## 2015-11-04 ENCOUNTER — Other Ambulatory Visit: Payer: Self-pay | Admitting: Pharmacist

## 2015-11-04 NOTE — Patient Outreach (Signed)
[place]) Care Management  [place]   11/04/2015  [name] 03-09-1931 PO:[id number]  Subjective: [name] is an 82yo who was referred to [place] for medication review.  I made outreach call to patient to review his medications with him.  I spoke to patient who asked me to speak to his son [name].  Reviewed patient's medications with [name].  [name] reports he assists patient with his medications and fills a pill box for the patient.    Patient previously reported concern about muscle relaxer to [name].  Patient's son reports that the muscle relaxer (tizanidine) made the patient weak, but patient is no longer taking that medication.    Objective:   Encounter Medications: Outpatient Encounter Prescriptions as of 11/04/2015  Medication Sig Note  . albuterol (PROVENTIL HFA;VENTOLIN HFA) 108 (90 BASE) MCG/ACT inhaler Inhale 2 puffs into the lungs every 6 (six) hours as needed for wheezing or shortness of breath.   Marland Kitchen albuterol (PROVENTIL) (2.5 MG/3ML) 0.083% nebulizer solution Take 3 mLs (2.5 mg total) by nebulization every 6 (six) hours as needed for wheezing. 11/04/2015: Has on hand if needed  . ARTIFICIAL TEAR OP Place 2 drops into both eyes daily as needed. For dry eyes   . aspirin 81 MG tablet Take 1 tablet (81 mg total) by mouth daily.   Marland Kitchen atorvastatin (LIPITOR) 80 MG tablet Take 1 tablet (80 mg total) by mouth daily at 6 PM.   . budesonide-formoterol (SYMBICORT) 160-4.5 MCG/ACT inhaler Inhale 2 puffs into the lungs 2 (two) times daily.   Marland Kitchen BYSTOLIC 10 MG tablet TAKE ONE TABLET BY MOUTH ONCE DAILY   . clopidogrel (PLAVIX) 75 MG tablet Take 1 tablet (75 mg total) by mouth daily with breakfast.   . furosemide (LASIX) 40 MG tablet Take 1.5 tablets (60 mg total) by mouth 2 (two) times daily.   . hydrALAZINE (APRESOLINE) 25 MG tablet Take 1 tablet (25 mg total) by mouth 3 (three) times daily.   . isosorbide mononitrate (IMDUR) 30 MG 24 hr tablet  Take 1 tablet (30 mg total) by mouth daily.   . metolazone (ZAROXOLYN) 2.5 MG tablet Take 2.5 mg by mouth. ON Monday, Wednesday and Friday mornings.   . montelukast (SINGULAIR) 10 MG tablet Take 10 mg by mouth at bedtime.   . Multiple Vitamins-Minerals (MULTIVITAMIN & MINERAL PO) Take 1 tablet by mouth daily.     . nitroGLYCERIN (NITROSTAT) 0.4 MG SL tablet Place 1 tablet (0.4 mg total) under the tongue every 5 (five) minutes as needed for chest pain. 11/04/2015: Has on hand if needed  . pantoprazole (PROTONIX) 40 MG tablet Take 1 tablet (40 mg total) by mouth daily.   Marland Kitchen tiotropium (SPIRIVA) 18 MCG inhalation capsule Place 1 capsule (18 mcg total) into inhaler and inhale daily.   . fluticasone (FLONASE) 50 MCG/ACT nasal spray Place 2 sprays into both nostrils daily. (Patient not taking: Reported on 11/04/2015)   . potassium chloride SA (KLOR-CON M20) 20 MEQ tablet Take 1 tablet (20 mEq total) by mouth 2 (two) times daily. (Patient not taking: Reported on 11/04/2015)   . predniSONE (DELTASONE) 20 MG tablet TAKE ONE-HALF TABLET BY MOUTH ONCE DAILY (Patient not taking: Reported on 11/04/2015)   . [DISCONTINUED] benzonatate (TESSALON) 200 MG capsule Take 1 capsule (200 mg total) by mouth 3 (three) times daily as needed for cough. (Patient not taking: Reported on 11/04/2015)   . [DISCONTINUED] dextromethorphan-guaiFENesin (MUCINEX DM) 30-600 MG per 12 hr  tablet Take 1 tablet by mouth 2 (two) times daily as needed for cough. Reported on 11/04/2015   . [DISCONTINUED] FERREX 150 150 MG capsule TAKE ONE CAPSULE BY MOUTH TWICE DAILY (Patient not taking: Reported on 11/04/2015)   . [DISCONTINUED] fluticasone (FLOVENT HFA) 110 MCG/ACT inhaler Inhale 2 puffs into the lungs 2 (two) times daily. (Patient not taking: Reported on 11/04/2015)    No facility-administered encounter medications on file as of 11/04/2015.    Functional Status: In your present state of health, do you have any difficulty performing the following  activities: 10/24/2015 10/17/2015  Hearing? [name]  Vision? N N  Difficulty concentrating or making decisions? N N  Walking or climbing stairs? Y Y  Dressing or bathing? Y Y  Doing errands, shopping? [name]  Preparing Food and eating ? N N  Using the Toilet? N N  In the past six months, have you accidently leaked urine? N N  Do you have problems with loss of bowel control? N N  Managing your Medications? N N  Managing your Finances? [name]  Housekeeping or managing your Housekeeping? [name]    Fall/Depression Screening: PHQ 2/9 Scores 10/24/2015 10/17/2015 10/10/2014  PHQ - 2 Score 0 0 0    Assessment:  Drugs sorted by system:  Neurologic/Psychologic: none  Cardiovascular: aspirin, atorvastatin, clopidogrel, furosemide, hydralazine, isosorbide mononitrate, metolazone, nebivolol, nitroglycerin SL, potassium  Pulmonary/Allergy: albuterol HFA and nebulizer, Symbicort, Spiriva, fluticasone nasal spray, montelukast   Gastrointestinal: pantoprazole  Endocrine: none  Renal: none  Topical: artificial tears  Pain: none  Vitamins/Minerals: multivitamin  Infectious Diseases: none  Miscellaneous: none   Duplications in therapy: dual anitplatelet therapy (aspirin and clopidogrel) Gaps in therapy: none noted Medications to avoid in the elderly: pantoprazole (risk of Clostridium difficile infection and bone loss and fractures) Drug interactions: aspirin and clopidogrel - may cause increased risk of bleeding  Other issues noted: During medication review, identified that patient is not currently taking potassium.  Patient has PCP follow up today and patient's son plans to ask provider about potassium.    Plan: 1.  I will send the findings of my medication review to patient's PCP.  I will recommend switching pantoprazole to a H2 blocker if clinically appropriate.  I will also recommend assessing whether dual antiplatelet therapy is still indicated.  Chart reviewed and patient's most recent stent  was in 2013.  Dual antiplatelet therapy is recommended for one year post stent.  Would consider discontinuation of clopidogrel if clinically appropriate.   2.  Patient's son [name] plans to discuss potassium with patient's PCP today and clarify whether patient should resume taking potassium.   3.  I will follow up with patient in one week.     Elisabeth Most, Pharm.D. Pharmacy Resident [place] [phone]

## 2015-11-05 ENCOUNTER — Other Ambulatory Visit: Payer: Self-pay | Admitting: Cardiovascular Disease

## 2015-11-06 ENCOUNTER — Encounter: Payer: Self-pay | Admitting: Pharmacist

## 2015-11-06 NOTE — Telephone Encounter (Signed)
[name] is asking for protonix but he hasn't been seen in our office since 06/19/14 and there isn't . Please advise

## 2015-11-07 ENCOUNTER — Other Ambulatory Visit: Payer: Self-pay | Admitting: Licensed Clinical Social Worker

## 2015-11-07 NOTE — Patient Outreach (Signed)
Assessment:  CSW called client on 11/07/15 and spoke via phone with client. CSW verified client identity. CSW and client spoke of client needs. Client said he had lost weight in recent weeks He said he spoke recently with Deloria Lair, geriatric nurse practitioner, regarding his recent weight loss.  Client said he had appointment with Dr. Harrington Challenger on Tuesday of this week.  Client said he uses wheelchair in the home and at recent appointment at office of Dr. Harrington Challenger that RN had bandaged the hands of client. Client said that he had bruises and cuts on hands and arms related to using wheelchair in home and bumping against doorways and walls while using wheelchair in the home.  Client said he had experienced some swelling in his hands and arms.  He said he is scheduled to see Dr. Harrington Challenger again for an appointment in 3 weeks. Client has had a history of falling at his home. Thus, his doctor recommended that client use wheelchair at this time due to client recent falls.  Client has his prescribed medications and is taking medications as prescribed. Client has support from his son, Pheng Senters.  Client said he is eating well and sleeping adequately. Client uses oxygen as prescribed 24/7 to help him breathe better. CSW and client spoke of client care plan. CSW encouraged client to continue to develop and use monthly budget and follow budget for next 30 days to help him pay monthly bills on time.  Client has difficulty standing and feels dizzy occasionally on standing. Client said he rests in his recliner at home and uses wheelchair to ambulate at home. Client said he has neighbors and friends who call or visit him regularly. He said he enjoys visiting with friends at his home and enjoys speaking via phone with neighbors and friends. CSW thanked client for phone call with CSW on 11/07/15. CSW encouraged client to call CSW at [phone] as needed to discuss social work needs of client.   Plan:  Client to develop monthly  financial budget and follow budget developed for next 30 days to help client pay monthly bills on time. CSW to collaborate with Deloria Lair, geriatric nurse practitioner, to monitor needs of client. CSW to call client in 3 weeks to assess needs of client at that time.  Norva Riffle.Tracye Szuch MSW, LCSW Licensed Clinical Social Worker [facility] Care Management [phone]

## 2015-11-11 ENCOUNTER — Other Ambulatory Visit: Payer: Self-pay | Admitting: Pharmacist

## 2015-11-12 NOTE — Patient Outreach (Signed)
[facility]) Care Management  [place]   11/12/2015  MENASHE LYDEN Dec 14, 1930 [id number]  Subjective: Dionis Spar is an 82yo who was referred to [facility] for medication review.  Initial outreach call to patient on 11/04/15.  Per patient's request, completed medication review with patient's son, Debron Fritch.  During medication review, I identified medication discrepancy regarding potassium.  Patient had PCP visit on 11/04/15, and patient's son planned to ask provider about potassium.    I made outreach call to patient today to follow up.  Patient's phone was answered by his son, Cartier Llanes.  Shanon Brow reports medications were reviewed at patient's PCP visit.  Shanon Brow reports patient was instructed to continue potassium 20 mEq BID.  Shanon Brow also reports that hydralazine and metolazone was discontinued.  Shanon Brow reports he continues to assist patient with his medications and fills a pill box for the patient.  Shanon Brow reports the patient is adherent with all medications.    Objective:   Encounter Medications: Outpatient Encounter Prescriptions as of 11/11/2015  Medication Sig Note  . albuterol (PROVENTIL HFA;VENTOLIN HFA) 108 (90 BASE) MCG/ACT inhaler Inhale 2 puffs into the lungs every 6 (six) hours as needed for wheezing or shortness of breath.   Marland Kitchen albuterol (PROVENTIL) (2.5 MG/3ML) 0.083% nebulizer solution Take 3 mLs (2.5 mg total) by nebulization every 6 (six) hours as needed for wheezing. 11/04/2015: Has on hand if needed  . ARTIFICIAL TEAR OP Place 2 drops into both eyes daily as needed. For dry eyes   . aspirin 81 MG tablet Take 1 tablet (81 mg total) by mouth daily.   Marland Kitchen atorvastatin (LIPITOR) 80 MG tablet Take 1 tablet (80 mg total) by mouth daily at 6 PM.   . budesonide-formoterol (SYMBICORT) 160-4.5 MCG/ACT inhaler Inhale 2 puffs into the lungs 2 (two) times daily.   Marland Kitchen BYSTOLIC 10 MG tablet TAKE ONE TABLET BY MOUTH ONCE DAILY   . clopidogrel (PLAVIX) 75 MG  tablet Take 1 tablet (75 mg total) by mouth daily with breakfast.   . furosemide (LASIX) 40 MG tablet Take 1.5 tablets (60 mg total) by mouth 2 (two) times daily.   . isosorbide mononitrate (IMDUR) 30 MG 24 hr tablet Take 1 tablet (30 mg total) by mouth daily.   . montelukast (SINGULAIR) 10 MG tablet Take 10 mg by mouth at bedtime.   . Multiple Vitamins-Minerals (MULTIVITAMIN & MINERAL PO) Take 1 tablet by mouth daily.     . nitroGLYCERIN (NITROSTAT) 0.4 MG SL tablet Place 1 tablet (0.4 mg total) under the tongue every 5 (five) minutes as needed for chest pain. 11/04/2015: Has on hand if needed  . pantoprazole (PROTONIX) 40 MG tablet Take 1 tablet (40 mg total) by mouth daily.   . potassium chloride SA (KLOR-CON M20) 20 MEQ tablet Take 1 tablet (20 mEq total) by mouth 2 (two) times daily.   Marland Kitchen tiotropium (SPIRIVA) 18 MCG inhalation capsule Place 1 capsule (18 mcg total) into inhaler and inhale daily.   . fluticasone (FLONASE) 50 MCG/ACT nasal spray Place 2 sprays into both nostrils daily. (Patient not taking: Reported on 11/04/2015)   . predniSONE (DELTASONE) 20 MG tablet TAKE ONE-HALF TABLET BY MOUTH ONCE DAILY (Patient not taking: Reported on 11/04/2015)   . [DISCONTINUED] hydrALAZINE (APRESOLINE) 25 MG tablet Take 1 tablet (25 mg total) by mouth 3 (three) times daily. (Patient not taking: Reported on 11/11/2015)   . [DISCONTINUED] metolazone (ZAROXOLYN) 2.5 MG tablet Take 2.5 mg by mouth. Reported on  11/12/2015    No facility-administered encounter medications on file as of 11/11/2015.    Functional Status: In your present state of health, do you have any difficulty performing the following activities: 10/24/2015 10/17/2015  Hearing? [name]  Vision? N N  Difficulty concentrating or making decisions? N N  Walking or climbing stairs? Y Y  Dressing or bathing? Y Y  Doing errands, shopping? [name]  Preparing Food and eating ? N N  Using the Toilet? N N  In the past six months, have you accidently leaked  urine? N N  Do you have problems with loss of bowel control? N N  Managing your Medications? N N  Managing your Finances? [name]  Housekeeping or managing your Housekeeping? [name]    Fall/Depression Screening: PHQ 2/9 Scores 10/24/2015 10/17/2015 10/10/2014  PHQ - 2 Score 0 0 0    Assessment/Plan: 1.  Medication review:  Patient's son reports patient is adherent with all medications and denies any questions or concerns regarding patient's medications at this time.  Provided patient's son with my contact information and advised him to call me should any questions or concerns arise.  Will close pharmacy program as no further intervention needed.  Will update [facility] CM Geriatric Nurse Pracitioner, Benard Rink, and [facility] Licensed Clinical Social Worker, [place], [place].D. Pharmacy Resident [place] [phone]

## 2015-11-20 ENCOUNTER — Other Ambulatory Visit (HOSPITAL_COMMUNITY)
Admission: RE | Admit: 2015-11-20 | Discharge: 2015-11-20 | Disposition: A | Discharge status: Home / Self Care | Payer: Medicare Other | Source: Other Acute Inpatient Hospital | Attending: Family Medicine | Admitting: Family Medicine

## 2015-11-20 DIAGNOSIS — R309 Painful micturition, unspecified: Secondary | ICD-10-CM | POA: Diagnosis present

## 2015-11-20 LAB — URINE MICROSCOPIC-ADD ON: RBC / HPF: NONE SEEN RBC/hpf (ref 0–5)

## 2015-11-20 LAB — URINALYSIS, ROUTINE W REFLEX MICROSCOPIC
Bilirubin Urine: NEGATIVE
Glucose, UA: >1000 mg/dL — AB
Hgb urine dipstick: NEGATIVE
Ketones, ur: NEGATIVE mg/dL
Leukocytes, UA: NEGATIVE
Nitrite: NEGATIVE
Protein, ur: NEGATIVE mg/dL
Specific Gravity, Urine: <1.005 — ABNORMAL LOW (ref 1.005–1.030)
pH: 5.5 (ref 5.0–8.0)

## 2015-11-22 LAB — URINE CULTURE

## 2015-11-25 ENCOUNTER — Other Ambulatory Visit: Payer: Self-pay | Admitting: Licensed Clinical Social Worker

## 2015-11-25 NOTE — Patient Outreach (Signed)
Assessment:  CSW spoke via phone with client on 11/25/15. CSW verified client identity. CSW and client spoke of client needs. Client reported that he had his prescribed medications and is taking medications as prescribed. Client has support from his son.  Client is using oxygen as prescribed in the home 24/7. Client reported that he is eating well and sleeping well.  Client sees Dr. Harrington Challenger as his primary care physician. Client said he has appointment with Dr. Harrington Challenger this Friday.  Client said he uses wheelchair in his home to help prevent falls for client. Client stated that the cost of his monthly oxygen has increased and it is difficult still for him to pay monthly bills. Client and CSW spoke of client care plan. CSW encouraged client to continue to develop monthly budget and then follow monthly budget to help client pay monthly bills on time.  Client said he has financial challenges.  Client has some support from his son. Client said he is doing well with affording costs of monthly medications. Client has [employer] for insurance coverage.  Client said he is able to go to bathroom adequately.  Client said he is able to use shower seat and able to take a bath adequately. Client said he sometimes becomes dizzy upon standing.  Client said his son helps procure food items for client and helps cook for client. Client said he has friends that occasionally bring food to client. Client said his son transports him (client) to and from needed medical appointments.  CSW encouraged client to attend client's scheduled medical appointments. CSW encouraged client to call CSW at [phone] as needed to discuss social work needs of client.   Plan:  Client to develop monthly budget and follow monthly budget developed to help client pay bills on time each month.  CSW to call client in 2 weeks to assess needs of client. CSW to collaborate with Deloria Lair, geriatric nurse practitioner, in monitoring client  needs.   Norva Riffle.Yarenis Cerino MSW, LCSW Licensed Clinical Social Worker [facility] Care Management [phone]

## 2015-12-09 ENCOUNTER — Other Ambulatory Visit: Payer: Self-pay | Admitting: Licensed Clinical Social Worker

## 2015-12-09 NOTE — Patient Outreach (Signed)
Assessment:  CSW spoke via phone with client on 12/09/15. CSW verified client identity. CSW and client spoke of client needs. Client said he had his prescribed medications and is taking medications as prescribed. Client said he is eating well and sleeping well. He said he uses oxygen as prescribed in the home 24/7. He uses Bi-Pap machine to help him sleep better at night. Client sees Dr. Lona Kettle as primary care physician.  Client said he had recent appointment with Dr. Harrington Challenger. Client said he has upcoming appointment with Dr. Harrington Challenger next Tuesday.  Client said he uses wheelchair in the home to ambulate.  CSW and client spoke of client care plan. CSW encouraged client to continue to develop monthly financial budget and to follow budget developed to help pay his monthly bills on time each month.  Client said he has support from his son.  Client said his son procures needed food items for client. Client said son of client cooks often for client and this is of great help to [name].  Client said that his son transports client to and from client scheduled medical appointments.CSW encouraged [name] to call CSW at [phone] as needed to discuss social work needs of client.    Plan:  Client to develop monthly budget in next 30 daysand follow budget developed  In next 30 days to help client pay monthly bills in timely manner. CSW to call client in  4 weeks to assess needs of client at that time.  Norva Riffle.Shakaya Bhullar MSW, LCSW Licensed Clinical Social Worker [facility] Care Management [phone]

## 2015-12-26 ENCOUNTER — Other Ambulatory Visit: Payer: Self-pay | Admitting: Cardiovascular Disease

## 2016-01-09 ENCOUNTER — Other Ambulatory Visit: Payer: Self-pay | Admitting: Licensed Clinical Social Worker

## 2016-01-09 NOTE — Patient Outreach (Signed)
Assessment:  CSW spoke via phone with client on 01/09/16. CSW verified client identity. CSW and client spoke of client needs. Client said he is eating well and sleeping well. He said he is using his oxygen in the home 24/7 to help him with breathing. He said he had his prescribed medications and is taking medications as prescribed.  Client uses his Bi-Pap machine at night to help him breath well while sleeping. Client sees Dr. Jamelle Rushing as primary care doctor. Client said he uses wheelchair in his home to help him ambulate. He said his son helps procure needed food items for client.  [name] said that his son often cooks at home for meals and this is of great help for client. Client said his son also helps transport client to and from client scheduled medical appointments.  CSW encouraged client to attend all scheduled client medical appointments.  Client said he had equipment needed to help him bathe in the home.  Client has appointment with cardiologist in July of 2017. Client has follow up appointment with Dr. Harrington Challenger in July of 2017.  Client uses oxygen 24/7. He has a back up oxygen canister to use as needed.  Client said he has some periodic bleeding from his skin. He has talked with Dr. Harrington Challenger about these bleeding issues several times.  Dr. Harrington Challenger had recommended client see cardiologist. Thus, client now has appointment set up for visit with cardiologist in July of 2017. Client said he does take a blood thinner as prescribed.  He said he takes a diuretic daily as prescribed.  Client and CSW spoke of client care plan. CSW encouraged client to devleop monthly budget in next 30 days and to follow budget developed in next 30 days to help client pay bills on time each month.  CSW encouraged client to call CSW at [phone] as needed to discuss social work needs of client. CSW thanked client for phone conversation with CSW on 01/09/16.   Plan:  Client to develop monthly budget in next 30 days and follow budget  developed in next 30 days to help him pay bills on time each month. CSW to call client in 2 weeks to assess needs of client.  [name] MSW, LCSW Licensed Clinical Social Worker [facility] Care Management [phone]

## 2016-01-12 ENCOUNTER — Other Ambulatory Visit: Payer: Self-pay | Admitting: *Deleted

## 2016-01-12 NOTE — Patient Outreach (Signed)
Called pt today after [name], CSW, reported to me that pt reported to him he is having spontaneous bleeding on his arms and legs at times. Pt denies injury. He does move around in the house in a wheelchair. He is on Plavix and and ASA daily. He is not scheduled to see an MD until next month.  I would like to examine him tomorrow and get a protime perhaps. I will try to pick up some protective sleeves for him today to help protect his arms from minor bumps.  Deloria Lair [facility] [place] [phone]

## 2016-01-13 ENCOUNTER — Encounter: Payer: Self-pay | Admitting: *Deleted

## 2016-01-13 ENCOUNTER — Other Ambulatory Visit: Payer: Self-pay | Admitting: *Deleted

## 2016-01-13 NOTE — Patient Outreach (Signed)
[facility]) Care Management   01/13/2016  STONE WATRING [birth year] FO:[id number]  [name] is an 82 y.o. male  Subjective: Pt reported spontaneous bleeding to CSW and he reported this to me and I went to see pt today. NO ACTIVE BLEEDING!    Pt has lost 40# since I last saw him. He is getting around his home a little better. He is unable to do any housework and his son isn't interested.  Objective:   Review of Systems  Constitutional: Positive for weight loss.  HENT: Negative.   Eyes: Negative.   Respiratory: Positive for shortness of breath.        On O2 at 2L. Occasional SOB on exertion.  Cardiovascular: Negative.   Gastrointestinal: Negative.   Genitourinary: Positive for frequency.  Musculoskeletal: Positive for joint pain.       Moderate to severe L shoulder pain with any movement for a month.  Skin:       Pt reports spontaneous bleeding from arms and legs without bumping them. Not excessive but says he will just look down and see that he is bleeding. He demonstrates multiple bruises and abrasions.  Neurological: Positive for weakness.   BP 112/70 mmHg  Pulse 54  Resp 18  Ht 1.803 m (5\' 11" )  Wt 243 lb (110.224 kg)  BMI 33.91 kg/m2  SpO2 98%  Physical Exam  Constitutional: He is oriented to person, place, and time. He appears well-developed and well-nourished.  HENT:  Head: Normocephalic and atraumatic.  Neck: Normal range of motion.  Cardiovascular: Normal rate, regular rhythm and normal heart sounds.   Respiratory: Effort normal.  Clear but diminished.  GI: Soft. Bowel sounds are normal.  Musculoskeletal:  Linmited ROM.  Neurological: He is alert and oriented to person, place, and time.  Skin: Skin is warm and dry.  Ecchymotic areas with some small abrasions, no active bleeding.  Psychiatric: He has a normal mood and affect.    Encounter Medications:   Outpatient Encounter Prescriptions as of 01/13/2016  Medication Sig Note  .  albuterol (PROVENTIL HFA;VENTOLIN HFA) 108 (90 BASE) MCG/ACT inhaler Inhale 2 puffs into the lungs every 6 (six) hours as needed for wheezing or shortness of breath.   Marland Kitchen albuterol (PROVENTIL) (2.5 MG/3ML) 0.083% nebulizer solution Take 3 mLs (2.5 mg total) by nebulization every 6 (six) hours as needed for wheezing. 11/04/2015: Has on hand if needed  . ARTIFICIAL TEAR OP Place 2 drops into both eyes daily as needed. For dry eyes   . aspirin 81 MG tablet Take 1 tablet (81 mg total) by mouth daily.   Marland Kitchen atorvastatin (LIPITOR) 80 MG tablet Take 1 tablet (80 mg total) by mouth daily at 6 PM.   . budesonide-formoterol (SYMBICORT) 160-4.5 MCG/ACT inhaler Inhale 2 puffs into the lungs 2 (two) times daily.   Marland Kitchen BYSTOLIC 10 MG tablet TAKE ONE TABLET BY MOUTH ONCE DAILY   . clopidogrel (PLAVIX) 75 MG tablet Take 1 tablet (75 mg total) by mouth daily with breakfast.   . fluticasone (FLONASE) 50 MCG/ACT nasal spray Place 2 sprays into both nostrils daily.   . furosemide (LASIX) 40 MG tablet Take 1.5 tablets (60 mg total) by mouth 2 (two) times daily.   . isosorbide mononitrate (IMDUR) 30 MG 24 hr tablet Take 1 tablet (30 mg total) by mouth daily.   Marland Kitchen KLOR-CON M20 20 MEQ tablet TAKE ONE TABLET BY MOUTH TWICE DAILY   . Multiple Vitamins-Minerals (MULTIVITAMIN & MINERAL PO) Take  1 tablet by mouth daily.     . pantoprazole (PROTONIX) 40 MG tablet TAKE ONE TABLET BY MOUTH ONCE DAILY   . montelukast (SINGULAIR) 10 MG tablet Take 10 mg by mouth at bedtime.   . nitroGLYCERIN (NITROSTAT) 0.4 MG SL tablet Place 1 tablet (0.4 mg total) under the tongue every 5 (five) minutes as needed for chest pain. (Patient not taking: Reported on 01/13/2016) 11/04/2015: Has on hand if needed  . predniSONE (DELTASONE) 20 MG tablet TAKE ONE-HALF TABLET BY MOUTH ONCE DAILY (Patient not taking: Reported on 11/04/2015)   . tiotropium (SPIRIVA) 18 MCG inhalation capsule Place 1 capsule (18 mcg total) into inhaler and inhale daily. (Patient not  taking: Reported on 01/13/2016) 01/13/2016: Advised he should take every day.   No facility-administered encounter medications on file as of 01/13/2016.    Functional Status:   In your present state of health, do you have any difficulty performing the following activities: 01/13/2016 10/24/2015  Hearing? [name]  Vision? N N  Difficulty concentrating or making decisions? Y N  Walking or climbing stairs? Y Y  Dressing or bathing? Y Y  Doing errands, shopping? [name]  Preparing Food and eating ? N N  Using the Toilet? N N  In the past six months, have you accidently leaked urine? Y N  Do you have problems with loss of bowel control? N N  Managing your Medications? Y N  Managing your Finances? [name]  Housekeeping or managing your Housekeeping? [name]    Fall/Depression Screening:    PHQ 2/9 Scores 01/13/2016 12/09/2015 11/25/2015 10/24/2015 10/17/2015 10/10/2014  PHQ - 2 Score 1 0 0 0 0 0   Fall Risk  01/13/2016 12/09/2015 11/25/2015 10/24/2015 10/17/2015  Falls in the past year? Yes Yes Yes Yes Yes  Number falls in past yr: 2 or more 2 or more 2 or more 2 or more 2 or more  Injury with Fall? No Yes Yes Yes Yes  Risk Factor Category  High Fall Risk High Fall Risk High Fall Risk High Fall Risk High Fall Risk  Risk for fall due to : History of fall(s);Impaired balance/gait;Impaired mobility;Medication side effect History of fall(s);Impaired balance/gait;Impaired mobility;Medication side effect History of fall(s);Impaired balance/gait;Impaired mobility;Medication side effect History of fall(s);Impaired balance/gait;Impaired mobility;Medication side effect History of fall(s);Impaired balance/gait;Impaired mobility;Medication side effect  Follow up Falls evaluation completed;Education provided;Falls prevention discussed Falls prevention discussed Falls prevention discussed Falls prevention discussed Falls evaluation completed;Education provided;Falls prevention discussed     Assessment:  General health decline.                           40# weight loss but remains obese                          Question bleeding dyscrasia vs long term prednisone use results.  Plan:  Drew CBC and protime and delivered these labs to Dr. Harrington Challenger' office.            Will ask pt's church if someone may volunteer to go help clean up his home some.            I will follow up in 2 weeks.  [facility] CM Care Plan Problem Three        Most Recent Value   Care Plan Problem Three  General health decline and substantial wt loss, remains obese.   Role Documenting the Problem Three  Care Management Coordinator   Care Plan for Problem Three  Active   THN Long Term Goal (31-90) days  Pt will participate in care management program to regain stabilization of his health over the next 90 days.   THN Long Term Goal Start Date  01/13/16   Interventions for Problem Three Long Term Goal  Visited pt and assessed that pt does need Community CM again pt agrees. Provided protective gloves to avoid small abrastions. Drew labs to assess hemodynamics.   [facility] CM Short Term Goal #1 (0-30 days)  Pt will accept assistance from outside source for some housekeeping duties during the next month.   [facility] CM Short Term Goal #1 Start Date  01/13/16   Interventions for Short Term Goal #1  Will request that his church consider sending help for a day or two to clean up and organize pt personal space.     Deloria Lair [facility] [place] [phone]

## 2016-01-26 ENCOUNTER — Other Ambulatory Visit: Payer: Self-pay | Admitting: Licensed Clinical Social Worker

## 2016-01-26 NOTE — Patient Outreach (Signed)
Assessment:  CSW spoke via phone with client on 01/26/16. CSW verified client identity. CSW and client spoke of client needs. Client said he had prescribed medications and is taking medications as prescribed. Client said he is eating adequately and sleeping adequately. Client said he is using Bi-Pap machine at night to help him with breathing. He sees Dr. Harrington Challenger, as primary care physician. Client said his son cooks meals often for client and this is of great help to client. Client is a fall risk; so, client said he continues to use wheelchair in his home to ambulate and to help prevent falls.  Client said his son helps transport client to and from client's scheduled medical appointments. CSW encouraged client to attend all scheduled client medical appointments. Client said he takes a blood thinner as prescribed. He said he takes a diurectic as prescribed.  CSW and client spoke of client care plan. CSW encouraged client to develop monthly budget and follow budget developed to help client pay bills on time each month. Client also uses oxygen 24/7 as prescribed in the home. Client is also receiving [facility] nursing support with Benard Rink, nurse practitioner.  Client said he has appointment scheduled with cardiologist in [place], [place] on 01/29/16.  Client has appointment also with Dr. Harrington Challenger in July of 2017. Client said he staggers sometimes when trying to walk.  Client said he can become fatigued in walking in his home. Client said he had lost about 100 pounds in about 18 months.  Client said his legs feel weak when he tries to walk. Client said that his son takes client to and from client medical appointments.  Client said local church built him a ramp on his home recently.   Client said he wears glasses to improve vision.  Client said he has friends and neighbors who visit regularly.  CSW encouraged client to socialize with friends and family as a means of relaxation. CSW encouraged client to call CSW at [phone]  as needed to discuss social work needs of client. Client was appreciative of call from [place] on 01/26/16.  Plan:  Client to develop monthly budget and follow budget developed to help client pay bills on time each month. CSW to collaborate with Deloria Lair, nurse practitioner, in monitoring needs of client. CSW to call client in 4 weeks to assess needs of client at that time.   Norva Riffle.Giulia Hickey MSW, LCSW Licensed Clinical Social Worker [facility] Care Management [phone]

## 2016-01-29 ENCOUNTER — Encounter: Payer: Self-pay | Admitting: Cardiovascular Disease

## 2016-01-29 ENCOUNTER — Ambulatory Visit (INDEPENDENT_AMBULATORY_CARE_PROVIDER_SITE_OTHER): Payer: Medicare Other | Admitting: Cardiovascular Disease

## 2016-01-29 VITALS — BP 114/80 | HR 68 | Ht 71.0 in | Wt 249.0 lb

## 2016-01-29 DIAGNOSIS — I5042 Chronic combined systolic (congestive) and diastolic (congestive) heart failure: Secondary | ICD-10-CM | POA: Diagnosis not present

## 2016-01-29 DIAGNOSIS — I10 Essential (primary) hypertension: Secondary | ICD-10-CM | POA: Diagnosis not present

## 2016-01-29 DIAGNOSIS — I2510 Atherosclerotic heart disease of native coronary artery without angina pectoris: Secondary | ICD-10-CM

## 2016-01-29 NOTE — Progress Notes (Signed)
[address], [place],   [address] Phone: [phone] Fax:  [phone]  Date:  01/29/2016   ID:  DERYLE ROSCOE, DOB [birth year], MRN [id number]  PCP:   Melinda Crutch, MD  Cardiologist:  Dr. Jenell Milliner => Dr. Liam Rogers      Problem List 1. coronary artery disease-status post CABG 1995, status post DES in June, 2013 2. COPD- due to asthma ( never smoker)  on home O2,  3. Chronic kidney disease stage III 4. Hypertension 5. Chronic combined systolic and diastolic congestive heart failure EF = 45-50%.    History of Present Illness: CLAUDEL BELKA is a 82 y.o. male with a hx CAD s/p CABG 1995, NSTEMI treated with DES x 2 in 12/2011, COPD with chronic respiratory failure on home O2, probable CKD stage III, HTN, chronic diastolic CHF.  2D Echo 10/26/11: normal LV, EF 55-60%, mod dilated LA, calcified right and noncoronary commisure.    He was admitted to the hospital in December for a GI bleed. He was hospitalized in early December with COPD exacerbation (TnI neg). He returned to the hospital around Christmas with melena and ABL anemia. He required 2 units PRBC for Hgb into the 7 range. EGD confirmed a prepyloric ulcer with path negative for malignancy. Hospital course was complicated by AKI (dc Cr 1.62). He likely has CKD as Cr in 2013 ran 1.3-1.7. Last Hgb on 07/26/13 was stable at 9.7 with negative FOBT. Aspirin and Plavix were held and restarted.    He was seen in the office for post hospital follow up on 08/07/13 and was volume overloaded (up 16 lbs) and complaining of chest pain with minimal activity.  He was admitted again [id number] with a/c diastolic CHF.   He was diuresed with IV Lasix.  Echo (08/07/2013):  Mod LVH, EF 50-55%, severe LaE, mild to mod reduced RVSF.  CEs were neg.  Chest pain resolved with diuresis.  He was kept on ASA and Plavix.  D/c weight 295.  Of note, ARB held at d/c due to low BP and AKI in setting of diuresis.    Since discharge, he is doing  well. His breathing is overall improved. However, with COPD he continues with chronic NYHA class III dyspnea. He sleeps in a recliner chronically. He denies PND. LE edema is much improved. He denies chest pain. He denies syncope.  Dec 13, 2013:  [name] seems to be doing quite a bit better. His shortness breath eventually resolved after a prednisone taper.  He has a big garden - tomatoes, green beans, peas.    Walks on occasion.    Stays active in his garden.    His BP is a bit high today .  Typically it's normal.    Tries to avoid salt. He eats at home most of the time ( son cooks).  He is retired as a Engineer, building services   Dec. 2, 2015:  [name] is seen today for follow up of his CAD , /hTN and chronic diastilic CHF. He is short of breath today.   wheezing significantly. His BP dropped recently . He was sen my his medical doctor and his hydralazine was stopped. He was also found to be volume overloaded and was started on metolazone 3 times a week.  January 29, 2016:  Mr. Diersen is doing well.  Hx of CAD, HTN, and chronic diastolic dysfunction  Has had progressive leg weakness.   Uses a cane, walker  or a wheelchair at home. He was examined in the wheelchair today. No chest pain or shortness breath.  Has some constipation  Prednisone is listed on med list but he has not been taking it .  Has home O2 .   Recent Labs: No results found for requested labs within last 365 days.  Wt Readings from Last 3 Encounters:  01/29/16 249 lb (112.946 kg)  01/13/16 243 lb (110.224 kg)  10/17/15 281 lb (127.461 kg)     Past Medical History  Diagnosis Date  . CAD (coronary artery disease)     a. s/p CABG x 3 in 1995; b.  NSTEMI 12/2011 -> Cath 6/21: LM 20-30, LAD 90, RI 95, LCX 65m, RCA 90/125m (treated w/ 2.0x16 Promus DES), VG->RCA ok, VG->RI 70-37m (treated w/ 4.0x28 Promus), LIMA->LAD ok, EF 55-60%.  Marland Kitchen HTN (hypertension)   . HLD (hyperlipidemia)   . Morbid obesity ([place])   . Syncope   . COPD  (chronic obstructive pulmonary disease) ([place])     a. on home O2  . HOH (hard of hearing)     left ear  . Asthma   . Sleep apnea   . Chronic diastolic CHF (congestive heart failure) ([place])     a. Echo 10/2011: EF 55-60%;  b. elevated EDP req diuresis.  . Chronic respiratory failure ([place]) 07/11/2013    a. 3L home O2 24/7.  Marland Kitchen Orthopnea     a. Has been sleeping in a recliner x 30 yrs.  . Hypothyroidism   . GERD (gastroesophageal reflux disease)   . Arthritis     "knees; hands" (07/11/2013)  . GI bleed     a. 06/2013 adm - prepyloric ulcer, path neg for malignancy. A/w ABL anemia.  . Acute blood loss anemia     a. 06/2013 due to GIB.  . CKD (chronic kidney disease)     a. ?Based on Cr 2013 - 1.3-1.7.    Current Outpatient Prescriptions  Medication Sig Dispense Refill  . albuterol (PROVENTIL HFA;VENTOLIN HFA) 108 (90 BASE) MCG/ACT inhaler Inhale 2 puffs into the lungs every 6 (six) hours as needed for wheezing or shortness of breath.    Marland Kitchen albuterol (PROVENTIL) (2.5 MG/3ML) 0.083% nebulizer solution Take 3 mLs (2.5 mg total) by nebulization every 6 (six) hours as needed for wheezing. 75 mL 12  . ARTIFICIAL TEAR OP Place 2 drops into both eyes daily as needed. For dry eyes    . aspirin 81 MG tablet Take 1 tablet (81 mg total) by mouth daily.    Marland Kitchen atorvastatin (LIPITOR) 80 MG tablet Take 1 tablet (80 mg total) by mouth daily at 6 PM. 91 tablet 3  . budesonide-formoterol (SYMBICORT) 160-4.5 MCG/ACT inhaler Inhale 2 puffs into the lungs 2 (two) times daily.    Marland Kitchen BYSTOLIC 10 MG tablet TAKE ONE TABLET BY MOUTH ONCE DAILY 30 tablet 6  . clopidogrel (PLAVIX) 75 MG tablet Take 1 tablet (75 mg total) by mouth daily with breakfast. 30 tablet 5  . fluticasone (FLONASE) 50 MCG/ACT nasal spray Place 2 sprays into both nostrils daily. 16 g 0  . furosemide (LASIX) 40 MG tablet Take 1.5 tablets (60 mg total) by mouth 2 (two) times daily. 90 tablet 0  . isosorbide mononitrate (IMDUR) 30 MG 24 hr tablet Take  1 tablet (30 mg total) by mouth daily. 30 tablet 5  . KLOR-CON M20 20 MEQ tablet TAKE ONE TABLET BY MOUTH TWICE DAILY 180 tablet 0  . montelukast (SINGULAIR) 10 MG  tablet Take 10 mg by mouth at bedtime.    . Multiple Vitamins-Minerals (MULTIVITAMIN & MINERAL PO) Take 1 tablet by mouth daily.      . nitroGLYCERIN (NITROSTAT) 0.4 MG SL tablet Place 1 tablet (0.4 mg total) under the tongue every 5 (five) minutes as needed for chest pain. 25 tablet 3  . pantoprazole (PROTONIX) 40 MG tablet TAKE ONE TABLET BY MOUTH ONCE DAILY 90 tablet 0  . predniSONE (DELTASONE) 20 MG tablet TAKE ONE-HALF TABLET BY MOUTH ONCE DAILY 30 tablet 3  . tiotropium (SPIRIVA) 18 MCG inhalation capsule Place 1 capsule (18 mcg total) into inhaler and inhale daily. 30 capsule 12   No current facility-administered medications for this visit.    Allergies:   Other   Social History:  The patient  reports that he has never smoked. He has never used smokeless tobacco. He reports that he drinks alcohol. He reports that he does not use illicit drugs.   Family History:  The patient's family history includes CAD (age of onset: 28) in his father.   ROS:  Please see the history of present illness.   He denies any melena or hematochezia. He has had some mild epistaxis. He has a chronic cough.   All other systems reviewed and negative.   PHYSICAL EXAM: VS:  BP 114/80 mmHg  Pulse 68  Ht 5\' 11"  (1.803 m)  Wt 249 lb (112.946 kg)  BMI 34.74 kg/m2 Well nourished, well developed, in no acute distress HEENT: normal Neck: I cannot assess JVD Cardiac:  distant S1, S2; RRR; no murmur Lungs:  Severe , tight wheezes.  Abd: soft, nontender, no hepatomegaly Ext: Very trace bilateral LE edema Skin: warm and dry Neuro:  CNs 2-12 intact, no focal abnormalities noted  EKG:   January 29, 2016: NSR at 68,  1st degree AV block ,  Occasional PVCs. Previous ant. Lateral MI.   Previous Inf. MI   ASSESSMENT AND PLAN:  1. coronary artery  disease-status post CABG 1995, status post DES in June, 2013  2. COPD- due to asthma ( never smoker)  on home O2,   3. Chronic kidney disease stage III  4. Hypertension - BP is well controlled.   5. Chronic combined systolic and diastolic congestive heart failure EF = 45-50%. Continue current meds.   6. Generalized leg weakness:  Not likely due to a cardiac condition or a medication .  I've advised him to ask his primary medical doctor about this. He is now a wheelchair.   Mertie Moores, MD  01/29/2016 11:54 AM    [address],  [place], [place]  [address] Pager [phone] Phone: [phone]; Fax: [phone]

## 2016-01-29 NOTE — Patient Instructions (Signed)
Medication Instructions:  Your physician recommends that you continue on your current medications as directed. Please refer to the Current Medication list given to you today.   Labwork: None Ordered   Testing/Procedures: None Ordered   Follow-Up: Your physician wants you to follow-up in: 6 months with Dr. Nahser.  You will receive a reminder letter in the mail two months in advance. If you don't receive a letter, please call our office to schedule the follow-up appointment.   If you need a refill on your cardiac medications before your next appointment, please call your pharmacy.   Thank you for choosing [facility]! Blessin Kanno, RN [phone]    

## 2016-02-02 ENCOUNTER — Other Ambulatory Visit: Payer: Self-pay | Admitting: *Deleted

## 2016-02-02 ENCOUNTER — Encounter: Payer: Self-pay | Admitting: *Deleted

## 2016-02-02 NOTE — Patient Outreach (Signed)
[facility]) Care Management   02/02/2016  [name] 1930/07/21 [id number]  [name] is an 82 y.o. male  Subjective: Pt is doing fair. He reports sitting down on a low concrete brick and not being able to get back up without help. He says he legs are just too weak. He saw his cardiologist last week and got a good report and will see his primary care MD this week. He denies any further bleeding of any significance.  Objective:   Review of Systems  Constitutional: Negative.   HENT: Negative.   Eyes: Negative.   Respiratory: Negative.   Cardiovascular: Negative.   Gastrointestinal: Negative.   Genitourinary: Negative.   Musculoskeletal: Positive for myalgias.       Left shoulder pain.  Skin: Negative.   Neurological: Negative.   Endo/Heme/Allergies: Bruises/bleeds easily.  Psychiatric/Behavioral: Negative.    BP 120/80 mmHg  Pulse 80  Resp 18  Wt 232 lb (105.235 kg)  SpO2 91%  Physical Exam  Constitutional: He is oriented to person, place, and time. He appears well-developed and well-nourished.  HENT:  Head: Normocephalic and atraumatic.  Cardiovascular: Normal rate, regular rhythm and normal heart sounds.   Respiratory: Effort normal and breath sounds normal.  GI: Soft. Bowel sounds are normal.  Musculoskeletal:  Limited.  Neurological: He is alert and oriented to person, place, and time.  Skin: Skin is warm and dry.  Psychiatric: He has a normal mood and affect.    Encounter Medications:   Outpatient Encounter Prescriptions as of 02/02/2016  Medication Sig Note  . albuterol (PROVENTIL HFA;VENTOLIN HFA) 108 (90 BASE) MCG/ACT inhaler Inhale 2 puffs into the lungs every 6 (six) hours as needed for wheezing or shortness of breath.   Marland Kitchen albuterol (PROVENTIL) (2.5 MG/3ML) 0.083% nebulizer solution Take 3 mLs (2.5 mg total) by nebulization every 6 (six) hours as needed for wheezing. 11/04/2015: Has on hand if needed  . ARTIFICIAL TEAR OP Place 2  drops into both eyes daily as needed. For dry eyes   . aspirin 81 MG tablet Take 1 tablet (81 mg total) by mouth daily.   Marland Kitchen atorvastatin (LIPITOR) 80 MG tablet Take 1 tablet (80 mg total) by mouth daily at 6 PM.   . budesonide-formoterol (SYMBICORT) 160-4.5 MCG/ACT inhaler Inhale 2 puffs into the lungs 2 (two) times daily.   Marland Kitchen BYSTOLIC 10 MG tablet TAKE ONE TABLET BY MOUTH ONCE DAILY   . clopidogrel (PLAVIX) 75 MG tablet Take 1 tablet (75 mg total) by mouth daily with breakfast.   . fluticasone (FLONASE) 50 MCG/ACT nasal spray Place 2 sprays into both nostrils daily.   . furosemide (LASIX) 40 MG tablet Take 1.5 tablets (60 mg total) by mouth 2 (two) times daily.   . isosorbide mononitrate (IMDUR) 30 MG 24 hr tablet Take 1 tablet (30 mg total) by mouth daily.   Marland Kitchen KLOR-CON M20 20 MEQ tablet TAKE ONE TABLET BY MOUTH TWICE DAILY   . montelukast (SINGULAIR) 10 MG tablet Take 10 mg by mouth at bedtime.   . Multiple Vitamins-Minerals (MULTIVITAMIN & MINERAL PO) Take 1 tablet by mouth daily.     . nitroGLYCERIN (NITROSTAT) 0.4 MG SL tablet Place 1 tablet (0.4 mg total) under the tongue every 5 (five) minutes as needed for chest pain. 11/04/2015: Has on hand if needed  . pantoprazole (PROTONIX) 40 MG tablet TAKE ONE TABLET BY MOUTH ONCE DAILY   . tiotropium (SPIRIVA) 18 MCG inhalation capsule Place 1 capsule (18 mcg  total) into inhaler and inhale daily. 01/13/2016: Advised he should take every day.   No facility-administered encounter medications on file as of 02/02/2016.    Functional Status:   In your present state of health, do you have any difficulty performing the following activities: 01/13/2016 10/24/2015  Hearing? [name]  Vision? N N  Difficulty concentrating or making decisions? Y N  Walking or climbing stairs? Y Y  Dressing or bathing? Y Y  Doing errands, shopping? [name]  Preparing Food and eating ? N N  Using the Toilet? N N  In the past six months, have you accidently leaked urine? Y N  Do you  have problems with loss of bowel control? N N  Managing your Medications? Y N  Managing your Finances? [name]  Housekeeping or managing your Housekeeping? [name]    Fall/Depression Screening:    PHQ 2/9 Scores 01/26/2016 01/13/2016 12/09/2015 11/25/2015 10/24/2015 10/17/2015 10/10/2014  PHQ - 2 Score 0 1 0 0 0 0 0    Assessment:  CAD, HF, COPD stable.  Plan: I will see him again next month and then graduate him again.

## 2016-02-26 ENCOUNTER — Other Ambulatory Visit: Payer: Self-pay | Admitting: Licensed Clinical Social Worker

## 2016-02-26 NOTE — Patient Outreach (Signed)
Assessment  CSW spoke via phone with client on 02/26/16. CSW verified client identity. CSW and client spoke of client needs. Client said he is eating well and sleeping well. He said he has his prescribed medication and is taking medications as prescribed. He said he uses oxygen in the home 24/7 as prescribed. He sees Dr. Lona Kettle as primary care physician.  Client uses wheelchair to help him ambulate. He said his son is supportive of client He said his son helps him procure needed food items. Client said his son cooks meals for client often and that this very helpful to client. Client said that his son transports client to and from client's medical appointments. Client and CSW spoke of client care plan. CSW encouraged client to develop a budget in next 30 days and to follow budget developed to help client  pay monthly bills on time each month. Client is receiving [facility] nursing support with Deloria Lair, Nurse Practitioner.  Client sees Dr. Harrington Challenger, primary care doctor as scheduled. Client sees cardiologist as scheduled. Client sometimes becomes fatigued when ambulating and has to take a rest break.  Client said that a local church had built a ramp for client at home of client.  Client said that Deloria Lair plans to conduct a home visit with client next Monday.  CSW encouraged client to socialize with family and friends as a means of relaxation for client. Client said he had swelling in his legs below the knees. He is taking diuretic as prescribed. Client said he had some droplets of clear liquid on his legs.  CSW informed client that CSW would contact Deloria Lair today and inform Kayleen Memos of above information about client's legs and condition of client's legs. Client agreed to this plan. CSW thanked client for phone call with CSW on 02/26/16.  CSW encouraged client to call CSW at [phone] as needed to discuss social work needs of client.   Plan:  Client to develop a budget in next 30 days and  folloow budget developed to help client pay monthly bills on  time each month. CSW to call client in 4 weeks to assess client needs at that time.  Norva Riffle.Gabreille Dardis MSW, LCSW Licensed Clinical Social Worker [facility] Care Management [phone]

## 2016-03-01 ENCOUNTER — Ambulatory Visit: Payer: Self-pay | Admitting: *Deleted

## 2016-03-02 ENCOUNTER — Other Ambulatory Visit: Payer: Self-pay | Admitting: *Deleted

## 2016-03-02 NOTE — Patient Outreach (Signed)
[place] [place] [facility]) Care Management   03/02/2016  [name] 1931/02/06 [id number]  [name] is an 82 y.o. male  Subjective: Pt is doing very well although he has been informed he has diabetes. He is now on glimepiride 2 mg. He would like some information about diabetes. His wt is stable, no edema, no SOB.  Objective:   Review of Systems  HENT: Negative.   Eyes: Negative.   Respiratory: Negative.   Cardiovascular: Negative.   Gastrointestinal: Negative.   Genitourinary: Negative.   Musculoskeletal: Positive for joint pain.  Skin: Negative.        Improved. Only one small abrasion noted on L arm.  Neurological: Positive for weakness.  Endo/Heme/Allergies: Bruises/bleeds easily.  Psychiatric/Behavioral: Negative.    Resp (P) 18   Wt (P) 245 lb (111.1 kg)   BMI (P) 34.17 kg/m  NFBS:   Physical Exam  Constitutional: He is oriented to person, place, and time. He appears well-developed and well-nourished.  Cardiovascular: Normal rate and regular rhythm.   Respiratory: Effort normal and breath sounds normal.  GI: Soft. Bowel sounds are normal.  Musculoskeletal:  Limited ROM.  Neurological: He is alert and oriented to person, place, and time.  Skin: Skin is warm and dry.  Onychomycotic toenails.  Psychiatric: He has a normal mood and affect.    Encounter Medications:   Outpatient Encounter Prescriptions as of 03/02/2016  Medication Sig Note  . albuterol (PROVENTIL HFA;VENTOLIN HFA) 108 (90 BASE) MCG/ACT inhaler Inhale 2 puffs into the lungs every 6 (six) hours as needed for wheezing or shortness of breath.   Marland Kitchen albuterol (PROVENTIL) (2.5 MG/3ML) 0.083% nebulizer solution Take 3 mLs (2.5 mg total) by nebulization every 6 (six) hours as needed for wheezing. 11/04/2015: Has on hand if needed  . ARTIFICIAL TEAR OP Place 2 drops into both eyes daily as needed. For dry eyes   . aspirin 81 MG tablet Take 1 tablet (81 mg total) by mouth daily.   Marland Kitchen atorvastatin  (LIPITOR) 80 MG tablet Take 1 tablet (80 mg total) by mouth daily at 6 PM.   . budesonide-formoterol (SYMBICORT) 160-4.5 MCG/ACT inhaler Inhale 2 puffs into the lungs 2 (two) times daily.   Marland Kitchen BYSTOLIC 10 MG tablet TAKE ONE TABLET BY MOUTH ONCE DAILY   . clopidogrel (PLAVIX) 75 MG tablet Take 1 tablet (75 mg total) by mouth daily with breakfast.   . fluticasone (FLONASE) 50 MCG/ACT nasal spray Place 2 sprays into both nostrils daily.   . furosemide (LASIX) 40 MG tablet Take 1.5 tablets (60 mg total) by mouth 2 (two) times daily.   Marland Kitchen glimepiride (AMARYL) 2 MG tablet Take 2 mg by mouth daily with breakfast.   . isosorbide mononitrate (IMDUR) 30 MG 24 hr tablet Take 1 tablet (30 mg total) by mouth daily.   Marland Kitchen KLOR-CON M20 20 MEQ tablet TAKE ONE TABLET BY MOUTH TWICE DAILY   . montelukast (SINGULAIR) 10 MG tablet Take 10 mg by mouth at bedtime.   . Multiple Vitamins-Minerals (MULTIVITAMIN & MINERAL PO) Take 1 tablet by mouth daily.     . pantoprazole (PROTONIX) 40 MG tablet TAKE ONE TABLET BY MOUTH ONCE DAILY   . rosuvastatin (CRESTOR) 20 MG tablet Take 20 mg by mouth daily.   Marland Kitchen tiotropium (SPIRIVA) 18 MCG inhalation capsule Place 1 capsule (18 mcg total) into inhaler and inhale daily. 01/13/2016: Advised he should take every day.  . nitroGLYCERIN (NITROSTAT) 0.4 MG SL tablet Place 1 tablet (0.4 mg  total) under the tongue every 5 (five) minutes as needed for chest pain. (Patient not taking: Reported on 03/02/2016) 11/04/2015: Has on hand if needed   No facility-administered encounter medications on file as of 03/02/2016.     Functional Status:   In your present state of health, do you have any difficulty performing the following activities: 01/13/2016 10/24/2015  Hearing? [name]  Vision? N N  Difficulty concentrating or making decisions? Y N  Walking or climbing stairs? Y Y  Dressing or bathing? Y Y  Doing errands, shopping? [name]  Preparing Food and eating ? N N  Using the Toilet? N N  In the past six  months, have you accidently leaked urine? Y N  Do you have problems with loss of bowel control? N N  Managing your Medications? Y N  Managing your Finances? [name]  Housekeeping or managing your Housekeeping? [name]  Some recent data might be hidden    Fall/Depression Screening:    PHQ 2/9 Scores 02/26/2016 01/26/2016 01/13/2016 12/09/2015 11/25/2015 10/24/2015 10/17/2015  PHQ - 2 Score 0 0 1 0 0 0 0    Assessment:  CHF - stable                         COPD - stable                         NIDDM - new diagnosis  Plan:  Will begin diabetes teaching. Will request that Dr. Harrington Challenger order CBG monitor and supplies.  [facility] CM Care Plan Problem One   Flowsheet Row Most Recent Value  Care Plan Problem One  Client has financial challenges related to paying copay amounts due to doctor's appointments  and has financial challenges related to paying monthly bills on time.   Role Documenting the Problem One  Clinical Social Worker  Care Plan for Problem One  Active  THN CM Short Term Goal #1 (0-30 days)  Client will develop budget and follow budget to pay bills in timely manner for next 30 days  THN CM Short Term Goal #1 Start Date  10/24/15  [facility] CM Short Term Goal #1 Met Date  11/25/15  Interventions for Short Term Goal #1  Goal is met. Client developed budget for 30 day period and paid bills on time during 30 day period through [employer].    [facility] CM Care Plan Problem Two   Flowsheet Row Most Recent Value  Care Plan Problem Two  Client continues to have financial challenges  Role Documenting the Problem Two  Clinical Social Worker  Care Plan for Problem Two  Active  THN CM Short Term Goal #1 (0-30 days)  Client will develop financial budget in next 30 days and will follow financial budget developed for client during next 30 days  THN CM Short Term Goal #1 Start Date  11/25/15  [facility] CM Short Term Goal #1 Met Date   01/09/16  Interventions for Short Term Goal #2   Client met goal. He developed financial budget  for 30 day period and tried to follow budget developed to pay [facility] bills. Goal met for 30 days period.  THN CM Short Term Goal #2 (0-30 days)  Client will develop financial budget for next 30 days  and follow budget developed to help him pay monthly bills on time each month  THN CM Short Term Goal #2 Start Date  01/09/16  [facility]  CM Short Term Goal #2 Met Date  02/26/16  Interventions for Short Term Goal #2  goal met. Client developed financial budget and followed financial budget developed in above time period to help him pay bills on time each month. Goal met.  THN CM Short Term Goal #3 (0-30 days)  Client will develop financial budget in next 30 days and follow financial budget developed to help him pay monthly bills on time each month  THN CM Short Term Goal #3 Start Date  02/26/16  Interventions for Short Term Goal #3  CSW encouraged client on 02/26/16 for client to develop financial budget in next 30 days and follow budget developed to help client pay monthly bills on time each month.    [facility] CM Care Plan Problem Three   Flowsheet Row Most Recent Value  Care Plan Problem Three  New diagnosis: NIDDN  Role Documenting the Problem Three  Care Management Coordinator  Care Plan for Problem Three  Active  THN Long Term Goal (31-90) days  Pt will be able to verbalize a good understanding of this diagnosis at the end of 90 days.  THN Long Term Goal Start Date  03/02/16  Interventions for Problem Three Long Term Goal  We discussed new diagnosis and need to limit concentrated sweets. I will send him some Emmi programs for review and to discuss next time.  THN CM Short Term Goal #1 (0-30 days)  Read Emmi materials and discuss on next home visit.  Interventions for Short Term Goal #1  Checked NFBS and discussed basics of disease managment.     Deloria Lair [facility] [phone]

## 2016-03-05 NOTE — Patient Outreach (Signed)
[facility]) Care Management   03/05/2016  BRANCE DARTT 09-20-1930 [id number]  [name] is an 82 y.o. male  Subjective: Mr. Angert states he is doing "pretty good." His weight is down, no more SOB than usual, no edema. He says his skin condition is improved, no bruises or skin tears. He reports Dr. Harrington Challenger has advised him he is a diabetic and started him on a new medication: glimepiride 2 mg to take in the am. Pt does not have a glucose monitor and needs diabetic education. Pt requests toenail debridement.  Objective:   Review of Systems  Constitutional: Negative.   HENT: Negative.   Eyes: Negative.   Respiratory: Negative.   Cardiovascular: Negative.   Gastrointestinal: Negative.   Genitourinary: Negative.   Musculoskeletal: Negative.   Skin: Negative.   Neurological: Negative.   Endo/Heme/Allergies: Negative.   Psychiatric/Behavioral: Negative.    Resp (P) 18   Wt (P) 245 lb (111.1 kg)   BMI (P) 34.17 kg/m BP 110/70 Pulse 62 Pulse Ox: 96%  Physical Exam  Constitutional: He is oriented to person, place, and time. He appears well-developed and well-nourished.  Cardiovascular: Normal rate and regular rhythm.   Murmur heard. Respiratory: Effort normal and breath sounds normal.  GI: Soft. Bowel sounds are normal.  Musculoskeletal:  Limited ROM, stiffness.  Neurological: He is alert and oriented to person, place, and time.  Skin: Skin is warm and dry.  Psychiatric: He has a normal mood and affect.    Encounter Medications:   Outpatient Encounter Prescriptions as of 03/02/2016  Medication Sig Note  . albuterol (PROVENTIL HFA;VENTOLIN HFA) 108 (90 BASE) MCG/ACT inhaler Inhale 2 puffs into the lungs every 6 (six) hours as needed for wheezing or shortness of breath.   Marland Kitchen albuterol (PROVENTIL) (2.5 MG/3ML) 0.083% nebulizer solution Take 3 mLs (2.5 mg total) by nebulization every 6 (six) hours as needed for wheezing. 11/04/2015: Has on hand if needed  .  ARTIFICIAL TEAR OP Place 2 drops into both eyes daily as needed. For dry eyes   . aspirin 81 MG tablet Take 1 tablet (81 mg total) by mouth daily.   Marland Kitchen atorvastatin (LIPITOR) 80 MG tablet Take 1 tablet (80 mg total) by mouth daily at 6 PM.   . budesonide-formoterol (SYMBICORT) 160-4.5 MCG/ACT inhaler Inhale 2 puffs into the lungs 2 (two) times daily.   Marland Kitchen BYSTOLIC 10 MG tablet TAKE ONE TABLET BY MOUTH ONCE DAILY   . clopidogrel (PLAVIX) 75 MG tablet Take 1 tablet (75 mg total) by mouth daily with breakfast.   . fluticasone (FLONASE) 50 MCG/ACT nasal spray Place 2 sprays into both nostrils daily.   . furosemide (LASIX) 40 MG tablet Take 1.5 tablets (60 mg total) by mouth 2 (two) times daily.   Marland Kitchen glimepiride (AMARYL) 2 MG tablet Take 2 mg by mouth daily with breakfast.   . isosorbide mononitrate (IMDUR) 30 MG 24 hr tablet Take 1 tablet (30 mg total) by mouth daily.   Marland Kitchen KLOR-CON M20 20 MEQ tablet TAKE ONE TABLET BY MOUTH TWICE DAILY   . montelukast (SINGULAIR) 10 MG tablet Take 10 mg by mouth at bedtime.   . Multiple Vitamins-Minerals (MULTIVITAMIN & MINERAL PO) Take 1 tablet by mouth daily.     . pantoprazole (PROTONIX) 40 MG tablet TAKE ONE TABLET BY MOUTH ONCE DAILY   . rosuvastatin (CRESTOR) 20 MG tablet Take 20 mg by mouth daily.   Marland Kitchen tiotropium (SPIRIVA) 18 MCG inhalation capsule Place 1 capsule (18  mcg total) into inhaler and inhale daily. 01/13/2016: Advised he should take every day.  . nitroGLYCERIN (NITROSTAT) 0.4 MG SL tablet Place 1 tablet (0.4 mg total) under the tongue every 5 (five) minutes as needed for chest pain. (Patient not taking: Reported on 03/02/2016) 11/04/2015: Has on hand if needed   No facility-administered encounter medications on file as of 03/02/2016.     Functional Status:   In your present state of health, do you have any difficulty performing the following activities: 01/13/2016 10/24/2015  Hearing? [name]  Vision? N N  Difficulty concentrating or making decisions? Y N   Walking or climbing stairs? Y Y  Dressing or bathing? Y Y  Doing errands, shopping? [name]  Preparing Food and eating ? N N  Using the Toilet? N N  In the past six months, have you accidently leaked urine? Y N  Do you have problems with loss of bowel control? N N  Managing your Medications? Y N  Managing your Finances? [name]  Housekeeping or managing your Housekeeping? [name]  Some recent data might be hidden    Fall/Depression Screening:    PHQ 2/9 Scores 02/26/2016 01/26/2016 01/13/2016 12/09/2015 11/25/2015 10/24/2015 10/17/2015  PHQ - 2 Score 0 0 1 0 0 0 0    Assessment:  CHF stable                         COPD stable                         New dx: NIDDM  Plan:  Diabetes Education, request order for Glucose monitor and strips. Follow up in 2 weeks.  [facility] CM Care Plan Problem One   Flowsheet Row Most Recent Value  Care Plan Problem One  Client has financial challenges related to paying copay amounts due to doctor's appointments  and has financial challenges related to paying monthly bills on time.   Role Documenting the Problem One  Clinical Social Worker  Care Plan for Problem One  Active  THN CM Short Term Goal #1 (0-30 days)  Client will develop budget and follow budget to pay bills in timely manner for next 30 days  THN CM Short Term Goal #1 Start Date  10/24/15  [facility] CM Short Term Goal #1 Met Date  11/25/15  Interventions for Short Term Goal #1  Goal is met. Client developed budget for 30 day period and paid bills on time during 30 day period through [employer].    [facility] CM Care Plan Problem Two   Flowsheet Row Most Recent Value  Care Plan Problem Two  Client continues to have financial challenges  Role Documenting the Problem Two  Clinical Social Worker  Care Plan for Problem Two  Active  THN CM Short Term Goal #1 (0-30 days)  Client will develop financial budget in next 30 days and will follow financial budget developed for client during next 30 days  THN CM Short Term Goal #1  Start Date  11/25/15  [facility] CM Short Term Goal #1 Met Date   01/09/16  Interventions for Short Term Goal #2   Client met goal. He developed financial budget for 30 day period and tried to follow budget developed to pay [place] bills. Goal met for 30 days period.  THN CM Short Term Goal #2 (0-30 days)  Client will develop financial budget for next 30 days  and follow budget  developed to help him pay monthly bills on time each month  THN CM Short Term Goal #2 Start Date  01/09/16  [facility] CM Short Term Goal #2 Met Date  02/26/16  Interventions for Short Term Goal #2  goal met. Client developed financial budget and followed financial budget developed in above time period to help him pay bills on time each month. Goal met.  THN CM Short Term Goal #3 (0-30 days)  Client will develop financial budget in next 30 days and follow financial budget developed to help him pay monthly bills on time each month  THN CM Short Term Goal #3 Start Date  02/26/16  Interventions for Short Term Goal #3  CSW encouraged client on 02/26/16 for client to develop financial budget in next 30 days and follow budget developed to help client pay monthly bills on time each month.    [facility] CM Care Plan Problem Three   Flowsheet Row Most Recent Value  Care Plan Problem Three  New diagnosis: NIDDN  Role Documenting the Problem Three  Care Management Coordinator  Care Plan for Problem Three  Active  THN Long Term Goal (31-90) days  Pt will be able to verbalize a good understanding of this diagnosis at the end of 90 days.  THN Long Term Goal Start Date  03/02/16  Interventions for Problem Three Long Term Goal  We discussed new diagnosis and need to limit concentrated sweets. I will send him some Emmi programs for review and to discuss next time.  THN CM Short Term Goal #1 (0-30 days)  Read Emmi materials and discuss on next home visit.  Interventions for Short Term Goal #1  Checked NFBS and discussed basics of disease managment.      Deloria Lair [facility] [place] [phone]

## 2016-03-18 ENCOUNTER — Other Ambulatory Visit: Payer: Self-pay | Admitting: *Deleted

## 2016-03-18 NOTE — Patient Outreach (Signed)
[name]  03/18/2016  [name] [birth year] [id number]   Home visit for diabetes education. Provided Sanchez Touch monitor from Dr. Harrington Challenger' office. Gave 2 diabetes education booklets to pt and went over the pertinent information for him ([facility] Diabetes and You and Diabetes Medications). I highlighted the important things I want him to know for basics: Normal, glucose levels and HgbA1C target values. What to do if he has hypoglycemia. How diabetes affects the body. What his medication does to help him control his glucose level.  Assisted pt in setting his new monitor and checking his blood sugar for the first time which was 154, 4 hours after breakfast!  Pt is to check and record FBS daily and 3 times a week check an evening glucose before supper.  I will check on him in 2 weeks and we will continue his education, concentrating on his diet.  [name]   Flowsheet Row Most Recent Value  Care Plan Problem Sanchez  (P) Client has financial challenges related to paying copay amounts due to doctor's appointments  and has financial challenges related to paying monthly bills on time.   Role Documenting the Problem Sanchez  (P) Clinical Social Worker  Care Plan for Problem Sanchez  (P) Active  THN CM Short Term Goal #1 (0-30 days)  (P) Client will develop budget and follow budget to pay bills in timely manner for next 30 days  THN CM Short Term Goal #1 Start Date  (P) 10/24/15  THN CM Short Term Goal #1 Met Date  (P) 11/25/15  Interventions for Short Term Goal #1  (P) Goal is met. Client developed budget for 30 day period and paid bills on time during 30 day period through [employer].    [name]   Flowsheet Row Most Recent Value  Care Plan Problem Sanchez  (P) Client continues to have financial challenges  Role Documenting the Problem Sanchez  (P) Clinical Social Worker  Care Plan for Problem Sanchez  (P) Active  THN CM Short  Term Goal #1 (0-30 days)  (P) Client will develop financial budget in next 30 days and will follow financial budget developed for client during next 30 days  THN CM Short Term Goal #1 Start Date  (P) 11/25/15  THN CM Short Term Goal #1 Met Date   (P) 01/09/16  Interventions for Short Term Goal #2   (P) Client met goal. He developed financial budget for 30 day period and tried to follow budget developed to pay [place] bills. Goal met for 30 days period.  THN CM Short Term Goal #2 (0-30 days)  (P) Client will develop financial budget for next 30 days  and follow budget developed to help him pay monthly bills on time each month  THN CM Short Term Goal #2 Start Date  (P) 01/09/16  THN CM Short Term Goal #2 Met Date  (P) 02/26/16  Interventions for Short Term Goal #2  (P) goal met. Client developed financial budget and followed financial budget developed in above time period to help him pay bills on time each month. Goal met.  THN CM Short Term Goal #3 (0-30 days)  (P) Client will develop financial budget in next 30 days and follow financial budget developed to help him pay monthly bills on time each month  THN CM Short Term Goal #3 Start Date  (P) 02/26/16  Interventions for Short Term Goal #3  (P)  CSW encouraged client on 02/26/16 for client to develop financial budget in next 30 days and follow budget developed to help client pay monthly bills on time each month.    [name]   Flowsheet Row Most Recent Value  Care Plan Problem Sanchez  (P) New diagnosis: NIDDN  Role Documenting the Problem Sanchez  (P) Care Sanchez Coordinator  Care Plan for Problem Sanchez  (P) Active  THN Long Term Goal (31-90) days  (P) Pt will be able to verbalize a good understanding of this diagnosis at the end of 90 days.  THN Long Term Goal Start Date  (P) 03/02/16  Interventions for Problem Sanchez Long Term Goal  (P) We discussed new diagnosis and need to limit concentrated sweets. I will send him some  Emmi programs for review and to discuss next time.  THN CM Short Term Goal #1 (0-30 days)  (P) Read Emmi materials and discuss on next home visit.  Interventions for Short Term Goal #1  (P) Checked NFBS and discussed basics of disease managment.      Deloria Lair [facility] [place] [phone]

## 2016-03-27 ENCOUNTER — Other Ambulatory Visit: Payer: Self-pay | Admitting: Cardiovascular Disease

## 2016-03-30 ENCOUNTER — Other Ambulatory Visit: Payer: Self-pay | Admitting: *Deleted

## 2016-03-30 ENCOUNTER — Ambulatory Visit: Payer: Self-pay | Admitting: *Deleted

## 2016-03-30 NOTE — Patient Outreach (Signed)
[facility]) Care Management  03/30/2016  [name] 1930/09/16 [id number]   Home visit for ongoing diabetes education. [name] was to check his glucose fasting and 3 times in the evening during this past two weeks. He reports he was not able to get the monitor to turn on after multiple attempts so he gave up. He said, I knew you were coming back so I decided to waint till you came back to help me."  We went through the procedure again together. I showed [name] that he doesn't need to turn on the monitor. The monitor turns on when the strip in slipped into place.   Pt's nonfasting glucose level was 98 mg/Dl.  We watched an Emmi program about Diabetes Type 2 together. [name] said it was basically review for him because his wife was a diabetic.  He states he has read through the materials I left with him and he is trying to watch his carbohydrate intake.  I will assign new Emmi programs for him and I have encouraged him to check his glucose daily.  I will call him next week to see how he is doing.  Deloria Lair [facility] [place] [phone]

## 2016-04-01 ENCOUNTER — Other Ambulatory Visit: Payer: Self-pay | Admitting: Licensed Clinical Social Worker

## 2016-04-01 NOTE — Patient Outreach (Signed)
Assessment:  CSW spoke via phone with client o n9/14/17. CSW verified client identity. CSW and client spoke of client needs. Client has prescribed medications and is taking medications as prescribed.  Client said he is eating well and sleeping adequately. He uses oxygen as prescribed in the home 24/7. He sees Dr. Harrington Challenger as primary care doctor.  Client is receiving [facility] nursing support with Deloria Lair, Nurse Practitioner.  Client has support from his son. Client's son helps transport client to and from client's medical appointments. Client's son cooks meals at client's home and this helps client with daily meal needs of client. CSW and client spoke of client care plan.CSW encouraged client to develop a monthly budget and follow budget developed to pay monthly bills on time each month. Local church has built a ramp for client and this is helpful to client. Client uses a wheelchair to ambulate. He becomes fatigued occasionally and has to take rest breaks. Client said he was recently diagnosed with Diabetes. He said Dr. Harrington Challenger informed him of his Diabetes diagnosis. Client is taking medication as prescribed for Diabetes by Dr. Harrington Challenger.  Client said he has swelling in his lower extremities. He said he takes a prescribed diuretic as scheduled.  He said he props up his legs when he is in recliner at home. He said he is taking blood sugar readings daily.  Client sees cardiologist as scheduled. CSW encouraged client to call CSW at [phone] as needed to discuss social work needs of client.Client said he was appreciative of support of [facility] CSW. Client is appreciative of support of Deloria Lair, Nurse Practitioner.  Plan:  Client to develop a monthly budget and follow budget developed to pay montly bills on time each month.  CSW to collaborate with Deloria Lair in monitoring client needs.  CSW to call client in 4 weeks to assess client needs.  Norva Riffle.Janesa Dockery MSW, LCSW Licensed Clinical Social Worker [facility]  Care Management [phone]

## 2016-04-06 ENCOUNTER — Other Ambulatory Visit: Payer: Self-pay | Admitting: *Deleted

## 2016-04-06 NOTE — Patient Outreach (Signed)
Telephone assessment. Pt still having difficulty with using glucose monitor. He reports he got a reading of 117 two days in a row and hasn't been able to get it to read since.  I told pt that I would come out this afternoon and we would practice, over and over, until he can do it without problem. [name] agreed to this.  Deloria Lair [facility] [phone]

## 2016-05-04 ENCOUNTER — Other Ambulatory Visit: Payer: Self-pay | Admitting: Licensed Clinical Social Worker

## 2016-05-04 NOTE — Patient Outreach (Signed)
Assessment:  CSW spoke via phone with client on 1017/17. CSW verified client identity. CSW and client spoke of client needs.  Client sees Dr. Harrington Challenger as primary care doctor. Client is trying to attend all scheduled medical appointments for client. Client said he has prescribed medications and is taking medications as prescribed. Client uses oxygen in the home as prescribed 24/7. He said he is using prescribed oxygen at 2 liters via nasal canula continuously  He said he is eating well and sleeping adequately. Client is receiving [facility] nursing support with [name], Nurse Practitioner.  Client has support from his son. Client's son transports client to and from client's scheduled medical appointments.  Client's son cooks meals at home of client and this helps client with daily food needs of client.  CSW  and client spoke of client care plan. CSW encouraged client to develop monthly budget and follow budget developed to pay bills on time each month. Client said he was recently diagnosed with Diabetes. He said he is taking prescribed medication for Diabetes. Client has talked with Dr. Harrington Challenger, his primary doctor, regarding client's diagnosis of Diabetes. Client does use a wheelchair to ambulate. He has a ramp at his home to help him go in and out of his residence. Client sees a cardiologist as scheduled.  Client said he takes diuretic as prescribed.  Client spoke of recent eye surgery on his right eye.  He said he plans to see eye surgeon in December of 2017 for medical appointment.  He spoke of blood thinner medication used. He said he bruises easily.  He said his son resides with him. He said also his grandaughter assists him occasionally. He said he appreciates support of these two family members. CSW encouraged [name] to call CSW at [phone] as needed to discuss social work needs of client.    Plan:  Client to develop monthly budget in next 30 days and follow budget developed to pay bills on time each  month.  CSW to collaborate with [name], Nurse Practitioner, in monitoring needs of client.  CSW to call client in 4 weeks to assess client needs.   [name].[name] MSW, LCSW Licensed Clinical Social Worker [facility] Care Management [phone]

## 2016-05-18 NOTE — Patient Outreach (Signed)
Addendum: I see that I did not chart my home visit from this afternoon, this is a late entry. I met with Mr. Attig and we practiced use of his finger stick and using his glucose monitor which he did with minimal coaching. I told him I would call him in October to see how he is doing.  Deloria Lair [facility] [phone]

## 2016-05-27 ENCOUNTER — Other Ambulatory Visit: Payer: Self-pay | Admitting: *Deleted

## 2016-05-27 NOTE — Patient Outreach (Signed)
Telephone assessment. Pt reports he has been to see Dr. Harrington Challenger and he had his Hgb A1C checked and it was 7.1. Dr. Harrington Challenger was very pround of him and pt reports his fasting glucose levels are in normal range daily I have asked if I can see him later this month and he agreed. I advised pt I will probably graduate him from case management again.  Deloria Lair [facility] [place] [phone]

## 2016-06-07 ENCOUNTER — Other Ambulatory Visit: Payer: Self-pay | Admitting: Licensed Clinical Social Worker

## 2016-06-07 NOTE — Patient Outreach (Signed)
Assessment  CSW spoke via phone with client on 06/07/16. CSW verified client identity.  CSW and client spoke of client needs. Client has prescribed medications and is taking medications as prescribed.  Client sees Dr. Harrington Challenger as primary care doctor.  Client uses oxygen as prescribed in the home to assist him with breathing. Client has support from his son.  Client's son helps transport client to and from client's scheduled medical appointments.  Client receives nursing support with Deloria Lair, Nurse Practitioner.  CSW and client spoke of client care plan. CSW encouaged client to develop a budget and follow budget developed in next 30 days to pay client bills on time each month. Client has a wheelchair to assist him in ambulation. He has a ramp at his home to help him go in and out of his residence.Client takes diuretic as prescribed. .  Client said he has lost some weight. He said he weighed 250 pounds at present. Client said he has a friend who checks on him regularly.  Client said he has a prescribed antibiotic he had recently received to use for his eye. He said he has had laser surgery several times on his eye. He plans to use prescribed antibiotic to help with healing in his eye. CSW and client spoke of client support with Deloria Lair, Nurse Practitioner. Client is appreciative of support he receives from [facility] staff. CSW encouraged [place] to call CSW at [phone] as needed to discuss social work needs of client.    Plan:  Client to devleop a budget and follow budget develop in next 30 days to pay client bills on time each month.  CSW to call client in 4 weeks to assess client needs.   Norva Riffle.Donovin Kraemer MSW, LCSW Licensed Clinical Social Worker [facility] Care Management [phone]

## 2016-06-09 ENCOUNTER — Ambulatory Visit: Payer: Self-pay | Admitting: *Deleted

## 2016-06-17 ENCOUNTER — Other Ambulatory Visit: Payer: Self-pay | Admitting: *Deleted

## 2016-06-17 NOTE — Patient Outreach (Signed)
[facility] [facility]) Care Management   06/17/2016  [name] May 19, 1931 PO:[id number]  [name] is an 82 y.o. male  Subjective: Pt is doing fair. He recently had R eye surgery and is putting eye drops in tid. He says he cannot see anything out of this eye except for shadows.  Pt is doing OK checking his sugar. It is running within normal limits. The low has been 62 and high 110. He is watching his carbohydrate intake.  He continues to weigh daily for his HF monitoring and does not use salt.   He is using his respiratory meds and nebs as ordered.  Objective:   Review of Systems  Constitutional: Negative.   HENT: Negative.   Eyes: Positive for blurred vision.  Respiratory: Positive for wheezing.   Cardiovascular: Negative.   Gastrointestinal: Negative.   Genitourinary: Negative.   Musculoskeletal: Negative.   Skin: Negative.   Neurological: Negative.   Endo/Heme/Allergies: Negative.   Psychiatric/Behavioral: Negative.    BP 130/70 (BP Location: Left Arm, Patient Position: Sitting)   Pulse 70   Resp 18   Wt 253 lb (114.8 kg)   SpO2 93%   BMI 35.29 kg/m   Physical Exam  Constitutional: He is oriented to person, place, and time. He appears well-developed and well-nourished.  HENT:  Head: Normocephalic.  Cardiovascular:  Irregular.  Respiratory: He has wheezes.  GI: Soft. Bowel sounds are normal.  Musculoskeletal:  Limited ROM.  Neurological: He is alert and oriented to person, place, and time.  Skin: Skin is warm and dry.  Psychiatric: He has a normal mood and affect.    Encounter Medications:   Outpatient Encounter Prescriptions as of 06/17/2016  Medication Sig Note  . albuterol (PROVENTIL HFA;VENTOLIN HFA) 108 (90 BASE) MCG/ACT inhaler Inhale 2 puffs into the lungs every 6 (six) hours as needed for wheezing or shortness of breath.   Marland Kitchen albuterol (PROVENTIL) (2.5 MG/3ML) 0.083% nebulizer solution Take 3 mLs (2.5 mg total) by nebulization every 6  (six) hours as needed for wheezing. 11/04/2015: Has on hand if needed  . ARTIFICIAL TEAR OP Place 2 drops into both eyes daily as needed. For dry eyes   . aspirin 81 MG tablet Take 1 tablet (81 mg total) by mouth daily.   Marland Kitchen atorvastatin (LIPITOR) 80 MG tablet Take 1 tablet (80 mg total) by mouth daily at 6 PM.   . budesonide-formoterol (SYMBICORT) 160-4.5 MCG/ACT inhaler Inhale 2 puffs into the lungs 2 (two) times daily.   Marland Kitchen BYSTOLIC 10 MG tablet TAKE ONE TABLET BY MOUTH ONCE DAILY   . clopidogrel (PLAVIX) 75 MG tablet Take 1 tablet (75 mg total) by mouth daily with breakfast.   . fluticasone (FLONASE) 50 MCG/ACT nasal spray Place 2 sprays into both nostrils daily.   . furosemide (LASIX) 40 MG tablet Take 1.5 tablets (60 mg total) by mouth 2 (two) times daily.   Marland Kitchen glimepiride (AMARYL) 2 MG tablet Take 2 mg by mouth daily with breakfast.   . isosorbide mononitrate (IMDUR) 30 MG 24 hr tablet Take 1 tablet (30 mg total) by mouth daily.   . montelukast (SINGULAIR) 10 MG tablet Take 10 mg by mouth at bedtime.   . Multiple Vitamins-Minerals (MULTIVITAMIN & MINERAL PO) Take 1 tablet by mouth daily.     . nitroGLYCERIN (NITROSTAT) 0.4 MG SL tablet Place 1 tablet (0.4 mg total) under the tongue every 5 (five) minutes as needed for chest pain. (Patient not taking: Reported on 03/02/2016) 11/04/2015: Has  on hand if needed  . pantoprazole (PROTONIX) 40 MG tablet TAKE ONE TABLET BY MOUTH ONCE DAILY   . potassium chloride SA (KLOR-CON M20) 20 MEQ tablet Take 1 tablet (20 mEq total) by mouth 2 (two) times daily.   . rosuvastatin (CRESTOR) 20 MG tablet Take 20 mg by mouth daily.   Marland Kitchen tiotropium (SPIRIVA) 18 MCG inhalation capsule Place 1 capsule (18 mcg total) into inhaler and inhale daily. 01/13/2016: Advised he should take every day.   No facility-administered encounter medications on file as of 06/17/2016.     Functional Status:   In your present state of health, do you have any difficulty performing the  following activities: 01/13/2016 10/24/2015  Hearing? [name]  Vision? N N  Difficulty concentrating or making decisions? Y N  Walking or climbing stairs? Y Y  Dressing or bathing? Y Y  Doing errands, shopping? [name]  Preparing Food and eating ? N N  Using the Toilet? N N  In the past six months, have you accidently leaked urine? Y N  Do you have problems with loss of bowel control? N N  Managing your Medications? Y N  Managing your Finances? [name]  Housekeeping or managing your Housekeeping? [name]  Some recent data might be hidden    Fall/Depression Screening:    PHQ 2/9 Scores 02/26/2016 01/26/2016 01/13/2016 12/09/2015 11/25/2015 10/24/2015 10/17/2015  PHQ - 2 Score 0 0 1 0 0 0 0    Assessment:  Well controlled diabetes                         CHF stable                         COPD fair  Plan: Today is my last home visit. Mr. Martines knows he can call me if he needs me in the future.           Do not burn leaves and breath in the smoke.           Be careful walking around in the yard as your depth perception is off!  Deloria Lair [facility] [phone]

## 2016-06-24 ENCOUNTER — Encounter: Payer: Self-pay | Admitting: *Deleted

## 2016-07-08 ENCOUNTER — Encounter: Payer: Self-pay | Admitting: Licensed Clinical Social Worker

## 2016-07-08 ENCOUNTER — Other Ambulatory Visit: Payer: Self-pay | Admitting: Licensed Clinical Social Worker

## 2016-07-08 NOTE — Patient Outreach (Signed)
Assessment:  CSW spoke via phone with client.  CSW verified client identity. CSW and client spoke of client needs.Client said he had his prescribed medications and is taking medications as prescribed. He said his son transports him to and from his scheduled medical appointments. He said his son resides with him. Client said his son cooks meals often for client and helps client with food procurement.  Deloria Lair, Nurse Practitioner, had been providing [facility] nursing care for client. Deloria Lair recently discharged client from [facility]. Client said he is eating well and sleeping well. He said he uses oxygen as prescribed in the home to help him with breathing needs of client. He has a wheelchair he uses to assist with ambulation. CSW informed client on 07/08/16 that client had met his care plan goals with [facility] CSW services. Thus, CSW would discharge client on 07/08/16 from [place] services. Client agreed to this plan. He said he was appreciative of support he had received from Deloria Lair, Nurse Practitioner. He said he was appreciative of support he had received from [place] on fact that client had met client's care plan goals with [facility] CSW services.    Plan:  CSW is discharging [place] D. Lindblad from [facility] CSW services on 07/08/16 since client has met care plans goals with [facility] CSW services.  CSW to inform Josepha Pigg that [place] discharged client on 07/08/16 from [place] services.  CSW to fax physician case closure letter to Dr. Lona Kettle informing Dr. Lona Kettle that [place] discharged client on 07/08/16 from [place] services.  Norva Riffle.Santino Kinsella MSW, LCSW Licensed Clinical Social Worker [facility] Care Management [phone]

## 2016-08-31 ENCOUNTER — Other Ambulatory Visit: Payer: Self-pay | Admitting: Family Medicine

## 2016-10-01 ENCOUNTER — Other Ambulatory Visit: Payer: Self-pay | Admitting: Cardiovascular Disease

## 2016-10-01 MED ORDER — PANTOPRAZOLE SODIUM 40 MG PO TBEC
40.0000 mg | DELAYED_RELEASE_TABLET | Freq: Every day | ORAL | 0 refills | Status: DC
Start: 2016-10-01 — End: 2016-12-13

## 2016-10-01 MED ORDER — PANTOPRAZOLE SODIUM 40 MG PO TBEC: 40.0000 mg | DELAYED_RELEASE_TABLET | Freq: Every day | ORAL | 0 refills | Status: DC

## 2016-11-05 ENCOUNTER — Ambulatory Visit (INDEPENDENT_AMBULATORY_CARE_PROVIDER_SITE_OTHER): Payer: Medicare Other | Admitting: Cardiovascular Disease

## 2016-11-05 ENCOUNTER — Encounter: Payer: Self-pay | Admitting: Cardiovascular Disease

## 2016-11-05 VITALS — BP 122/72 | HR 62 | Ht 71.0 in | Wt 260.8 lb

## 2016-11-05 DIAGNOSIS — I2510 Atherosclerotic heart disease of native coronary artery without angina pectoris: Secondary | ICD-10-CM | POA: Diagnosis not present

## 2016-11-05 DIAGNOSIS — I5032 Chronic diastolic (congestive) heart failure: Secondary | ICD-10-CM | POA: Diagnosis not present

## 2016-11-05 MED ORDER — NITROGLYCERIN 0.4 MG SL SUBL: 0.4000 mg | SUBLINGUAL_TABLET | SUBLINGUAL | 3 refills | Status: DC | PRN

## 2016-11-05 NOTE — Patient Instructions (Signed)
Medication Instructions:  Your physician recommends that you continue on your current medications as directed. Please refer to the Current Medication list given to you today.   Labwork: None Ordered   Testing/Procedures: None Ordered   Follow-Up: Your physician wants you to follow-up in: 1 year with Dr. Nahser.  You will receive a reminder letter in the mail two months in advance. If you don't receive a letter, please call our office to schedule the follow-up appointment.   If you need a refill on your cardiac medications before your next appointment, please call your pharmacy.   Thank you for choosing [facility]! Cathlin Buchan, RN [phone]    

## 2016-11-05 NOTE — Progress Notes (Signed)
[address] [place], [place]  [address] Phone: [phone] Fax:  [phone]  Date:  11/05/2016   ID:  [name], DOB [birth year], MRN [id number]  PCP:  Fred Crutch, MD  Cardiologist:  Dr. Jenell Sanchez => Dr. Liam Sanchez      Problem List 1. coronary artery disease-status post CABG 1995, status post DES in June, 2013 2. COPD- due to asthma ( never smoker)  on home O2,  3. Chronic kidney disease stage III 4. Hypertension 5. Chronic combined systolic and diastolic congestive heart failure EF = 45-50%.    History of Present Illness: [name] is a 83 y.o. male with a hx CAD s/p CABG 1995, NSTEMI treated with DES x 2 in 12/2011, COPD with chronic respiratory failure on home O2, probable CKD stage III, HTN, chronic diastolic CHF.  2D Echo 10/26/11: normal LV, EF 55-60%, mod dilated LA, calcified right and noncoronary commisure.    He was admitted to the hospital in December for a GI bleed. He was hospitalized in early December with COPD exacerbation (TnI neg). He returned to the hospital around Christmas with melena and ABL anemia. He required 2 units PRBC for Hgb into the 7 range. EGD confirmed a prepyloric ulcer with path negative for malignancy. Hospital course was complicated by AKI (dc Cr 1.62). He likely has CKD as Cr in 2013 ran 1.3-1.7. Last Hgb on 07/26/13 was stable at 9.7 with negative FOBT. Aspirin and Plavix were held and restarted.    He was seen in the office for post hospital follow up on 08/07/13 and was volume overloaded (up 16 lbs) and complaining of chest pain with minimal activity.  He was admitted again 3/61-4/43 with a/c diastolic CHF.   He was diuresed with IV Lasix.  Echo (08/07/2013):  Mod LVH, EF 50-55%, severe LaE, mild to mod reduced RVSF.  CEs were neg.  Chest pain resolved with diuresis.  He was kept on ASA and Plavix.  D/c weight 295.  Of note, ARB held at d/c due to low BP and AKI in setting of diuresis.    Since discharge, he is doing  well. His breathing is overall improved. However, with COPD he continues with chronic NYHA class III dyspnea. He sleeps in a recliner chronically. He denies PND. LE edema is much improved. He denies chest pain. He denies syncope.  Dec 13, 2013:  [name] seems to be doing quite a bit better. His shortness breath eventually resolved after a prednisone taper.  He has a big garden - tomatoes, green beans, peas.    Walks on occasion.    Stays active in his garden.    His BP is a bit high today .  Typically it's normal.    Tries to avoid salt. He eats at home most of the time ( son cooks).  He is retired as a Engineer, building services   Dec. 2, 2015:  [name] is seen today for follow up of his CAD , /hTN and chronic diastilic CHF. He is short of breath today.   wheezing significantly. His BP dropped recently . He was sen my his medical doctor and his hydralazine was stopped. He was also found to be volume overloaded and was started on metolazone 3 times a week.  January 29, 2016:  [name] is doing well.  Hx of CAD, HTN, and chronic diastolic dysfunction  Has had progressive leg weakness.   Uses a cane, walker or  a wheelchair at home. He was examined in the wheelchair today. No chest pain or shortness breath.  Has some constipation  Prednisone is listed on med list but he has not been taking it .  Has home O2 .  November 05, 2016:  [name] is seen today for follow up of his CAD and chronic diastolic CHF. Staying active.   Tries to stay active.  Medical doctor manages his lipids and writes his script  for Lipitor    Recent Labs: No results found for requested labs within last 8760 hours.  Wt Readings from Last 3 Encounters:  11/05/16 260 lb 12.8 oz (118.3 kg)  06/17/16 253 lb (114.8 kg)  03/02/16 (P) 245 lb (111.1 kg)     Past Medical History:  Diagnosis Date  . Acute blood loss anemia    a. 06/2013 due to GIB.  Marland Kitchen Arthritis    "knees; hands" (07/11/2013)  . Asthma   . CAD (coronary artery  disease)    a. s/p CABG x 3 in 1995; b.  NSTEMI 12/2011 -> Cath 6/21: LM 20-30, LAD 90, RI 95, LCX 34m, RCA 90/158m (treated w/ 2.0x16 Promus DES), VG->RCA ok, VG->RI 70-66m (treated w/ 4.0x28 Promus), LIMA->LAD ok, EF 55-60%.  . Chronic diastolic CHF (congestive heart failure) ([place])    a. Echo 10/2011: EF 55-60%;  b. elevated EDP req diuresis.  . Chronic respiratory failure ([place]) 07/11/2013   a. 3L home O2 24/7.  Marland Kitchen CKD (chronic kidney disease)    a. ?Based on Cr 2013 - 1.3-1.7.  . COPD (chronic obstructive pulmonary disease) ([place])    a. on home O2  . GERD (gastroesophageal reflux disease)   . GI bleed    a. 06/2013 adm - prepyloric ulcer, path neg for malignancy. A/w ABL anemia.  Marland Kitchen HLD (hyperlipidemia)   . HOH (hard of hearing)    left ear  . HTN (hypertension)   . Hypothyroidism   . Morbid obesity ([place])   . Orthopnea    a. Has been sleeping in a recliner x 30 yrs.  . Sleep apnea   . Syncope     Current Outpatient Prescriptions  Medication Sig Dispense Refill  . albuterol (PROVENTIL HFA;VENTOLIN HFA) 108 (90 BASE) MCG/ACT inhaler Inhale 2 puffs into the lungs every 6 (six) hours as needed for wheezing or shortness of breath.    Marland Kitchen albuterol (PROVENTIL) (2.5 MG/3ML) 0.083% nebulizer solution Take 3 mLs (2.5 mg total) by nebulization every 6 (six) hours as needed for wheezing. 75 mL 12  . ARTIFICIAL TEAR OP Place 2 drops into both eyes daily as needed. For dry eyes    . aspirin 81 MG tablet Take 1 tablet (81 mg total) by mouth daily.    Marland Kitchen atorvastatin (LIPITOR) 80 MG tablet Take 1 tablet (80 mg total) by mouth daily at 6 PM. 91 tablet 3  . budesonide-formoterol (SYMBICORT) 160-4.5 MCG/ACT inhaler Inhale 2 puffs into the lungs 2 (two) times daily.    Marland Kitchen BYSTOLIC 10 MG tablet TAKE ONE TABLET BY MOUTH ONCE DAILY 30 tablet 6  . clopidogrel (PLAVIX) 75 MG tablet Take 1 tablet (75 mg total) by mouth daily with breakfast. 30 tablet 5  . fluticasone (FLONASE) 50 MCG/ACT nasal spray Place 2  sprays into both nostrils daily. 16 g 0  . furosemide (LASIX) 40 MG tablet Take 1.5 tablets (60 mg total) by mouth 2 (two) times daily. 90 tablet 0  . glimepiride (AMARYL) 2 MG tablet Take 2 mg by mouth  daily with breakfast.    . isosorbide mononitrate (IMDUR) 30 MG 24 hr tablet Take 1 tablet (30 mg total) by mouth daily. 30 tablet 5  . montelukast (SINGULAIR) 10 MG tablet Take 10 mg by mouth at bedtime.    . Multiple Vitamins-Minerals (MULTIVITAMIN & MINERAL PO) Take 1 tablet by mouth daily.      . nitroGLYCERIN (NITROSTAT) 0.4 MG SL tablet Place 1 tablet (0.4 mg total) under the tongue every 5 (five) minutes as needed for chest pain. 25 tablet 3  . pantoprazole (PROTONIX) 40 MG tablet Take 1 tablet (40 mg total) by mouth daily. 90 tablet 0  . potassium chloride SA (KLOR-CON M20) 20 MEQ tablet Take 1 tablet (20 mEq total) by mouth 2 (two) times daily. 180 tablet 3  . rosuvastatin (CRESTOR) 20 MG tablet Take 20 mg by mouth daily.    Marland Kitchen tiotropium (SPIRIVA) 18 MCG inhalation capsule Place 1 capsule (18 mcg total) into inhaler and inhale daily. 30 capsule 12   No current facility-administered medications for this visit.     Allergies:   Other   Social History:  The patient  reports that he has never smoked. He has never used smokeless tobacco. He reports that he drinks alcohol. He reports that he does not use drugs.   Family History:  The patient's family history includes CAD (age of onset: 53) in his father.   ROS:  Please see the history of present illness.   He denies any melena or hematochezia. He has had some mild epistaxis. He has a chronic cough.   All other systems reviewed and negative.   PHYSICAL EXAM: VS:  BP 122/72 (BP Location: Left Arm, Patient Position: Sitting, Cuff Size: Large)   Pulse 62   Ht 5\' 11"  (1.803 m)   Wt 260 lb 12.8 oz (118.3 kg)   SpO2 96%   BMI 36.37 kg/m  Well nourished, well developed, in no acute distress  HEENT: normal  Neck: I cannot assess JVD    Cardiac:  distant S1, S2; RRR; no murmur  Lungs:  Occasional  wheezes.  Abd: soft, nontender, no hepatomegaly  Ext: Very trace bilateral LE edema  Skin: warm and dry  Neuro:  CNs 2-12 intact, no focal abnormalities noted  EKG:   November 05, 2016: NSR at 34.  1st degree AV block .  Previous Inf. MI ? Ant . MI    ASSESSMENT AND PLAN:  1. coronary artery disease-status post CABG 1995, status post DES in June, 2013 Primary MD manages his lipids   2. COPD- due to asthma ( never smoker)  on home O2,   3. Chronic kidney disease stage III  4. Hypertension - BP is well controlled.   5. Chronic combined systolic and diastolic congestive heart failure EF = 45-50%. Is on home O2 Continue current meds.   6. Generalized leg weakness:  Not likely due to a cardiac condition or a medication .  I've advised him to ask his primary medical doctor about this.   Staggers a bit.   Uses a wheelchair on occasion    Mertie Moores, MD  11/05/2016 9:[address] [place],  [place], [place]  [address] Pager [phone] Phone: [phone]; Fax: [phone]

## 2016-12-13 ENCOUNTER — Other Ambulatory Visit: Payer: Self-pay | Admitting: Cardiovascular Disease

## 2017-08-04 ENCOUNTER — Other Ambulatory Visit: Payer: Self-pay | Admitting: *Deleted

## 2017-08-04 DIAGNOSIS — I2510 Atherosclerotic heart disease of native coronary artery without angina pectoris: Secondary | ICD-10-CM

## 2017-08-04 MED ORDER — CLOPIDOGREL BISULFATE 75 MG PO TABS: 75.0000 mg | ORAL_TABLET | Freq: Every day | ORAL | 2 refills | Status: DC

## 2017-08-04 MED ORDER — ATORVASTATIN CALCIUM 80 MG PO TABS: 80.0000 mg | ORAL_TABLET | Freq: Every day | ORAL | 2 refills | Status: DC

## 2017-08-04 MED ORDER — POTASSIUM CHLORIDE CRYS ER 20 MEQ PO TBCR: 20.0000 meq | EXTENDED_RELEASE_TABLET | Freq: Two times a day (BID) | ORAL | 2 refills | Status: DC

## 2017-08-04 MED ORDER — ISOSORBIDE MONONITRATE ER 30 MG PO TB24: 30.0000 mg | ORAL_TABLET | Freq: Every day | ORAL | 2 refills | Status: DC

## 2017-09-05 ENCOUNTER — Other Ambulatory Visit: Payer: Self-pay | Admitting: Cardiovascular Disease

## 2017-09-12 ENCOUNTER — Emergency Department (HOSPITAL_COMMUNITY): Payer: Medicare Other

## 2017-09-12 ENCOUNTER — Encounter (HOSPITAL_COMMUNITY): Payer: Self-pay

## 2017-09-12 ENCOUNTER — Other Ambulatory Visit: Payer: Self-pay

## 2017-09-12 ENCOUNTER — Observation Stay (HOSPITAL_COMMUNITY)
Admission: EM | Admit: 2017-09-12 | Discharge: 2017-09-13 | Disposition: A | Discharge status: Home / Self Care | Payer: Medicare Other | Attending: Internal Medicine | Admitting: Internal Medicine

## 2017-09-12 DIAGNOSIS — R079 Chest pain, unspecified: Secondary | ICD-10-CM | POA: Diagnosis present

## 2017-09-12 DIAGNOSIS — J441 Chronic obstructive pulmonary disease with (acute) exacerbation: Secondary | ICD-10-CM | POA: Diagnosis not present

## 2017-09-12 DIAGNOSIS — E669 Obesity, unspecified: Secondary | ICD-10-CM | POA: Diagnosis not present

## 2017-09-12 DIAGNOSIS — I2510 Atherosclerotic heart disease of native coronary artery without angina pectoris: Secondary | ICD-10-CM | POA: Diagnosis present

## 2017-09-12 DIAGNOSIS — E1122 Type 2 diabetes mellitus with diabetic chronic kidney disease: Secondary | ICD-10-CM | POA: Insufficient documentation

## 2017-09-12 DIAGNOSIS — E785 Hyperlipidemia, unspecified: Secondary | ICD-10-CM | POA: Insufficient documentation

## 2017-09-12 DIAGNOSIS — N183 Chronic kidney disease, stage 3 unspecified: Secondary | ICD-10-CM | POA: Diagnosis present

## 2017-09-12 DIAGNOSIS — I10 Essential (primary) hypertension: Secondary | ICD-10-CM | POA: Diagnosis present

## 2017-09-12 DIAGNOSIS — I4891 Unspecified atrial fibrillation: Secondary | ICD-10-CM | POA: Insufficient documentation

## 2017-09-12 DIAGNOSIS — I259 Chronic ischemic heart disease, unspecified: Secondary | ICD-10-CM

## 2017-09-12 DIAGNOSIS — Z7902 Long term (current) use of antithrombotics/antiplatelets: Secondary | ICD-10-CM | POA: Diagnosis not present

## 2017-09-12 DIAGNOSIS — I252 Old myocardial infarction: Secondary | ICD-10-CM | POA: Diagnosis not present

## 2017-09-12 DIAGNOSIS — I130 Hypertensive heart and chronic kidney disease with heart failure and stage 1 through stage 4 chronic kidney disease, or unspecified chronic kidney disease: Secondary | ICD-10-CM | POA: Diagnosis not present

## 2017-09-12 DIAGNOSIS — I5032 Chronic diastolic (congestive) heart failure: Secondary | ICD-10-CM | POA: Insufficient documentation

## 2017-09-12 DIAGNOSIS — Z7982 Long term (current) use of aspirin: Secondary | ICD-10-CM | POA: Insufficient documentation

## 2017-09-12 DIAGNOSIS — Z7984 Long term (current) use of oral hypoglycemic drugs: Secondary | ICD-10-CM | POA: Insufficient documentation

## 2017-09-12 DIAGNOSIS — Z951 Presence of aortocoronary bypass graft: Secondary | ICD-10-CM | POA: Diagnosis not present

## 2017-09-12 DIAGNOSIS — E1129 Type 2 diabetes mellitus with other diabetic kidney complication: Secondary | ICD-10-CM

## 2017-09-12 LAB — INFLUENZA PANEL BY PCR (TYPE A & B)
Influenza A By PCR: NEGATIVE
Influenza B By PCR: NEGATIVE

## 2017-09-12 LAB — BASIC METABOLIC PANEL
Anion gap: 10 (ref 5–15)
BUN: 28 mg/dL — AB (ref 6–20)
CALCIUM: 8.6 mg/dL — AB (ref 8.9–10.3)
CHLORIDE: 91 mmol/L — AB (ref 101–111)
CO2: 39 mmol/L — ABNORMAL HIGH (ref 22–32)
CREATININE: 1.62 mg/dL — AB (ref 0.61–1.24)
GFR, EST AFRICAN AMERICAN: 43 mL/min — AB (ref >60)
GFR, EST NON AFRICAN AMERICAN: 37 mL/min — AB (ref >60)
Glucose, Bld: 195 mg/dL — ABNORMAL HIGH (ref 65–99)
Potassium: 3.8 mmol/L (ref 3.5–5.1)
SODIUM: 140 mmol/L (ref 135–145)

## 2017-09-12 LAB — CBC
HCT: 47.6 % (ref 39.0–52.0)
Hemoglobin: 14.3 g/dL (ref 13.0–17.0)
MCH: 29.5 pg (ref 26.0–34.0)
MCHC: 30 g/dL (ref 30.0–36.0)
MCV: 98.3 fL (ref 78.0–100.0)
PLATELETS: 179 10*3/uL (ref 150–400)
RBC: 4.84 MIL/uL (ref 4.22–5.81)
RDW: 14 % (ref 11.5–15.5)
WBC: 11.3 10*3/uL — AB (ref 4.0–10.5)

## 2017-09-12 LAB — TROPONIN I

## 2017-09-12 LAB — GLUCOSE, CAPILLARY: Glucose-Capillary: 173 mg/dL — ABNORMAL HIGH (ref 65–99)

## 2017-09-12 MED ORDER — ACETAMINOPHEN 325 MG PO TABS: 650.0000 mg | ORAL_TABLET | Freq: Four times a day (QID) | ORAL | Status: DC | PRN

## 2017-09-12 MED ORDER — MONTELUKAST SODIUM 10 MG PO TABS
10.0000 mg | ORAL_TABLET | Freq: Every day | ORAL | Status: DC
  Administered 2017-09-12: 10 mg via ORAL
  Filled 2017-09-12: qty 1

## 2017-09-12 MED ORDER — POTASSIUM CHLORIDE CRYS ER 20 MEQ PO TBCR
20.0000 meq | EXTENDED_RELEASE_TABLET | Freq: Two times a day (BID) | ORAL | Status: DC
  Administered 2017-09-12 – 2017-09-13 (×2): 20 meq via ORAL
  Filled 2017-09-12 (×2): qty 1

## 2017-09-12 MED ORDER — AZITHROMYCIN 250 MG PO TABS
500.0000 mg | ORAL_TABLET | Freq: Every day | ORAL | Status: DC
  Administered 2017-09-12 – 2017-09-13 (×2): 500 mg via ORAL
  Filled 2017-09-12 (×2): qty 2

## 2017-09-12 MED ORDER — IPRATROPIUM-ALBUTEROL 0.5-2.5 (3) MG/3ML IN SOLN
3.0000 mL | Freq: Once | RESPIRATORY_TRACT | Status: AC
  Administered 2017-09-12: 3 mL via RESPIRATORY_TRACT
  Filled 2017-09-12: qty 3

## 2017-09-12 MED ORDER — FLUTICASONE PROPIONATE 50 MCG/ACT NA SUSP
2.0000 | Freq: Every day | NASAL | Status: DC
  Administered 2017-09-12 – 2017-09-13 (×2): 2 via NASAL
  Filled 2017-09-12: qty 16

## 2017-09-12 MED ORDER — METHYLPREDNISOLONE SODIUM SUCC 125 MG IJ SOLR
60.0000 mg | Freq: Four times a day (QID) | INTRAMUSCULAR | Status: DC
  Administered 2017-09-12 – 2017-09-13 (×3): 60 mg via INTRAVENOUS
  Filled 2017-09-12 (×3): qty 2

## 2017-09-12 MED ORDER — TIOTROPIUM BROMIDE MONOHYDRATE 18 MCG IN CAPS
18.0000 ug | ORAL_CAPSULE | Freq: Every day | RESPIRATORY_TRACT | Status: DC
  Administered 2017-09-13: 18 ug via RESPIRATORY_TRACT
  Filled 2017-09-12 (×2): qty 5

## 2017-09-12 MED ORDER — INSULIN ASPART 100 UNIT/ML ~~LOC~~ SOLN: 0.0000 [IU] | Freq: Every day | SUBCUTANEOUS | Status: DC

## 2017-09-12 MED ORDER — INSULIN ASPART 100 UNIT/ML ~~LOC~~ SOLN
3.0000 [IU] | Freq: Three times a day (TID) | SUBCUTANEOUS | Status: DC
  Administered 2017-09-13 (×2): 3 [IU] via SUBCUTANEOUS

## 2017-09-12 MED ORDER — ALBUTEROL SULFATE (2.5 MG/3ML) 0.083% IN NEBU
2.5000 mg | INHALATION_SOLUTION | Freq: Once | RESPIRATORY_TRACT | Status: AC
  Administered 2017-09-12: 2.5 mg via RESPIRATORY_TRACT
  Filled 2017-09-12: qty 3

## 2017-09-12 MED ORDER — ACETAMINOPHEN 650 MG RE SUPP: 650.0000 mg | Freq: Four times a day (QID) | RECTAL | Status: DC | PRN

## 2017-09-12 MED ORDER — ONDANSETRON HCL 4 MG PO TABS: 4.0000 mg | ORAL_TABLET | Freq: Four times a day (QID) | ORAL | Status: DC | PRN

## 2017-09-12 MED ORDER — SENNOSIDES-DOCUSATE SODIUM 8.6-50 MG PO TABS: 1.0000 | ORAL_TABLET | Freq: Every evening | ORAL | Status: DC | PRN

## 2017-09-12 MED ORDER — NEBIVOLOL HCL 10 MG PO TABS
10.0000 mg | ORAL_TABLET | Freq: Every day | ORAL | Status: DC
  Administered 2017-09-13: 10 mg via ORAL
  Filled 2017-09-12: qty 1

## 2017-09-12 MED ORDER — PANTOPRAZOLE SODIUM 40 MG PO TBEC
40.0000 mg | DELAYED_RELEASE_TABLET | Freq: Every day | ORAL | Status: DC
  Administered 2017-09-13: 40 mg via ORAL
  Filled 2017-09-12: qty 1

## 2017-09-12 MED ORDER — NITROGLYCERIN 0.4 MG SL SUBL: 0.4000 mg | SUBLINGUAL_TABLET | SUBLINGUAL | Status: DC | PRN

## 2017-09-12 MED ORDER — ASPIRIN 81 MG PO CHEW
81.0000 mg | CHEWABLE_TABLET | Freq: Every day | ORAL | Status: DC
  Administered 2017-09-13: 81 mg via ORAL
  Filled 2017-09-12: qty 1

## 2017-09-12 MED ORDER — BUDESONIDE 0.5 MG/2ML IN SUSP
0.5000 mg | Freq: Two times a day (BID) | RESPIRATORY_TRACT | Status: DC
  Administered 2017-09-12 – 2017-09-13 (×2): 0.5 mg via RESPIRATORY_TRACT
  Filled 2017-09-12 (×2): qty 2

## 2017-09-12 MED ORDER — ALBUTEROL SULFATE (2.5 MG/3ML) 0.083% IN NEBU: 2.5000 mg | INHALATION_SOLUTION | RESPIRATORY_TRACT | Status: DC | PRN

## 2017-09-12 MED ORDER — ISOSORBIDE MONONITRATE ER 60 MG PO TB24
30.0000 mg | ORAL_TABLET | Freq: Every day | ORAL | Status: DC
  Administered 2017-09-13: 30 mg via ORAL
  Filled 2017-09-12: qty 1

## 2017-09-12 MED ORDER — ONDANSETRON HCL 4 MG/2ML IJ SOLN: 4.0000 mg | Freq: Four times a day (QID) | INTRAMUSCULAR | Status: DC | PRN

## 2017-09-12 MED ORDER — FUROSEMIDE 40 MG PO TABS
60.0000 mg | ORAL_TABLET | Freq: Two times a day (BID) | ORAL | Status: DC
  Administered 2017-09-12 – 2017-09-13 (×2): 60 mg via ORAL
  Filled 2017-09-12 (×2): qty 1

## 2017-09-12 MED ORDER — INSULIN ASPART 100 UNIT/ML ~~LOC~~ SOLN
0.0000 [IU] | Freq: Three times a day (TID) | SUBCUTANEOUS | Status: DC
  Administered 2017-09-13: 3 [IU] via SUBCUTANEOUS
  Administered 2017-09-13: 1 [IU] via SUBCUTANEOUS

## 2017-09-12 MED ORDER — ENOXAPARIN SODIUM 60 MG/0.6ML ~~LOC~~ SOLN
60.0000 mg | SUBCUTANEOUS | Status: DC
  Administered 2017-09-12: 60 mg via SUBCUTANEOUS
  Filled 2017-09-12: qty 0.6

## 2017-09-12 MED ORDER — SODIUM CHLORIDE 0.9 % IV SOLN
INTRAVENOUS | Status: DC
  Administered 2017-09-12 – 2017-09-13 (×2): via INTRAVENOUS

## 2017-09-12 MED ORDER — ATORVASTATIN CALCIUM 40 MG PO TABS
80.0000 mg | ORAL_TABLET | Freq: Every day | ORAL | Status: DC
  Administered 2017-09-12: 80 mg via ORAL
  Filled 2017-09-12: qty 2

## 2017-09-12 MED ORDER — MOMETASONE FURO-FORMOTEROL FUM 200-5 MCG/ACT IN AERO
2.0000 | INHALATION_SPRAY | Freq: Two times a day (BID) | RESPIRATORY_TRACT | Status: DC
  Administered 2017-09-12 – 2017-09-13 (×2): 2 via RESPIRATORY_TRACT
  Filled 2017-09-12 (×2): qty 8.8

## 2017-09-12 MED ORDER — GLIMEPIRIDE 2 MG PO TABS
2.0000 mg | ORAL_TABLET | Freq: Every day | ORAL | Status: DC
  Administered 2017-09-13: 2 mg via ORAL
  Filled 2017-09-12 (×2): qty 1

## 2017-09-12 MED ORDER — ADULT MULTIVITAMIN W/MINERALS CH
1.0000 | ORAL_TABLET | Freq: Every day | ORAL | Status: DC
  Administered 2017-09-13: 1 via ORAL
  Filled 2017-09-12: qty 1

## 2017-09-12 MED ORDER — CLOPIDOGREL BISULFATE 75 MG PO TABS
75.0000 mg | ORAL_TABLET | Freq: Every day | ORAL | Status: DC
  Administered 2017-09-13: 75 mg via ORAL
  Filled 2017-09-12 (×2): qty 1

## 2017-09-12 NOTE — ED Notes (Signed)
Put pt on 3L O2

## 2017-09-12 NOTE — ED Notes (Signed)
Pt on Chronic O2

## 2017-09-12 NOTE — H&P (Signed)
History and Physical    [name] DOB: July 08, 1931 DOA: 09/12/2017  Referring MD/NP/PA: Milton Ferguson, EDP PCP: Lawerance Cruel, MD  Patient coming from: Home  Chief Complaint: Shortness of breath, cough  HPI: [name] is a 84 y.o. male with history of COPD on chronic oxygen, chronic congestive heart failure, stage III chronic kidney disease and coronary artery disease among other issues.  He has been sick for about 10 days with URI symptoms.  He saw his PCP a week ago who gave him a steroid shot and sent him home with a prednisone taper with a presumed diagnosis of bronchitis.  He was not given antibiotics per the daughter.  Patient does not recall having an influenza swab.  He has been having issues with his oxygen at home, his tanks have not been working, his oxygen company has been out a few times and supposedly the issue has been resolved, however last night at about 10 PM he noticed that his oxygen was not working became very short of breath and panicked.  He then developed left-sided chest pain without radiation that only resolved after nitroglycerin given in the ED.  Has also had a cough productive of yellow sputum for the past 7-10 days.  In the ED his vital signs are stable, labs are significant for creatinine of 1.6 which is actually better than his baseline of around 1.8-1.9, WBC count is 11.3, chest x-ray with no acute cardiopulmonary abnormalities.  Admission is requested.  Past Medical/Surgical History: Past Medical History:  Diagnosis Date  . Acute blood loss anemia    a. 06/2013 due to GIB.  Marland Kitchen Arthritis    "knees; hands" (07/11/2013)  . Asthma   . CAD (coronary artery disease)    a. s/p CABG x 3 in 1995; b.  NSTEMI 12/2011 -> Cath 6/21: LM 20-30, LAD 90, RI 95, LCX 61m, RCA 90/110m (treated w/ 2.0x16 Promus DES), VG->RCA ok, VG->RI 70-77m (treated w/ 4.0x28 Promus), LIMA->LAD ok, EF 55-60%.  . Chronic diastolic CHF (congestive heart failure) ([place])      a. Echo 10/2011: EF 55-60%;  b. elevated EDP req diuresis.  . Chronic respiratory failure ([place]) 07/11/2013   a. 3L home O2 24/7.  Marland Kitchen CKD (chronic kidney disease)    a. ?Based on Cr 2013 - 1.3-1.7.  . COPD (chronic obstructive pulmonary disease) ([place])    a. on home O2  . GERD (gastroesophageal reflux disease)   . GI bleed    a. 06/2013 adm - prepyloric ulcer, path neg for malignancy. A/w ABL anemia.  Marland Kitchen HLD (hyperlipidemia)   . HOH (hard of hearing)    left ear  . HTN (hypertension)   . Hypothyroidism   . Morbid obesity ([place])   . Orthopnea    a. Has been sleeping in a recliner x 30 yrs.  . Sleep apnea   . Syncope     Past Surgical History:  Procedure Laterality Date  . [place]   "when I was in the [place]" (07/11/2013)  . CORONARY ANGIOPLASTY WITH STENT PLACEMENT  12/2011  . CORONARY ARTERY BYPASS GRAFT  1995   CABG X3  . ESOPHAGOGASTRODUODENOSCOPY N/A 07/12/2013   Procedure: ESOPHAGOGASTRODUODENOSCOPY (EGD);  Surgeon: Inda Castle, MD;  Location: [facility];  Service: Endoscopy;  Laterality: N/A;  . EXCISIONAL HEMORRHOIDECTOMY     "twice cut them out; burnt them out once"  . FUNCTIONAL ENDOSCOPIC SINUS SURGERY  2006  . HYDROCELE EXCISION  2005  .  INGUINAL HERNIA REPAIR Right 2006  . KNEE ARTHROSCOPY Right 1990's  . LEFT HEART CATHETERIZATION WITH CORONARY/GRAFT ANGIOGRAM  01/07/2012   Procedure: LEFT HEART CATHETERIZATION WITH Beatrix Fetters;  Surgeon: Peter M Martinique, MD;  Location: [facility] CATH LAB;  Service: Cardiovascular;;  . PERCUTANEOUS CORONARY STENT INTERVENTION (PCI-S) N/A 01/10/2012   Procedure: PERCUTANEOUS CORONARY STENT INTERVENTION (PCI-S);  Surgeon: Peter M Martinique, MD;  Location: [facility] CATH LAB;  Service: Cardiovascular;  Laterality: N/A;  . PROSTATE SURGERY  1998    Social History:  reports that  has never smoked. he has never used smokeless tobacco. He reports that he drinks alcohol. He reports that he does not use  drugs.  Allergies: Allergies  Allergen Reactions  . Other     Cream cheese-makes very sick    Family History:  Family History  Problem Relation Age of Onset  . CAD Father 76    Prior to Admission medications   Medication Sig Start Date End Date Taking? Authorizing Provider  albuterol (PROVENTIL HFA;VENTOLIN HFA) 108 (90 BASE) MCG/ACT inhaler Inhale 2 puffs into the lungs every 6 (six) hours as needed for wheezing or shortness of breath.   Yes [provider]  albuterol (PROVENTIL) (2.5 MG/3ML) 0.083% nebulizer solution Take 3 mLs (2.5 mg total) by nebulization every 6 (six) hours as needed for wheezing. 07/05/13  Yes Bonnielee Haff, MD  ARTIFICIAL TEAR OP Place 2 drops into both eyes daily as needed. For dry eyes   Yes [provider]  aspirin 81 MG tablet Take 1 tablet (81 mg total) by mouth daily. 08/10/13  Yes Dunn, Dayna N, PA-C  atorvastatin (LIPITOR) 80 MG tablet Take 1 tablet (80 mg total) by mouth daily at 6 PM. 08/04/17  Yes Nahser, Wonda Cheng, MD  budesonide (PULMICORT) 0.5 MG/2ML nebulizer solution Take 0.5 mg by nebulization 2 (two) times daily.   Yes [provider]  budesonide-formoterol (SYMBICORT) 160-4.5 MCG/ACT inhaler Inhale 2 puffs into the lungs 2 (two) times daily.   Yes [provider]  BYSTOLIC 10 MG tablet TAKE ONE TABLET BY MOUTH ONCE DAILY 05/01/14  Yes Nahser, Wonda Cheng, MD  clopidogrel (PLAVIX) 75 MG tablet Take 1 tablet (75 mg total) by mouth daily with breakfast. 08/04/17  Yes Nahser, Wonda Cheng, MD  fluticasone (FLONASE) 50 MCG/ACT nasal spray Place 2 sprays into both nostrils daily. 10/13/14  Yes Short, Noah Delaine, MD  furosemide (LASIX) 40 MG tablet Take 1.5 tablets (60 mg total) by mouth 2 (two) times daily. 06/19/15  Yes Nahser, Wonda Cheng, MD  glimepiride (AMARYL) 2 MG tablet Take 2 mg by mouth daily with breakfast.   Yes Lawerance Cruel, MD  ipratropium (ATROVENT) 0.02 % nebulizer solution Take 0.5 mg by nebulization every  6 (six) hours as needed for wheezing or shortness of breath.   Yes [provider]  isosorbide mononitrate (IMDUR) 30 MG 24 hr tablet Take 1 tablet (30 mg total) by mouth daily. 08/04/17  Yes Nahser, Wonda Cheng, MD  montelukast (SINGULAIR) 10 MG tablet Take 10 mg by mouth at bedtime.   Yes [provider]  Multiple Vitamins-Minerals (MULTIVITAMIN & MINERAL PO) Take 1 tablet by mouth daily.     Yes [provider]  nitroGLYCERIN (NITROSTAT) 0.4 MG SL tablet Place 1 tablet (0.4 mg total) under the tongue every 5 (five) minutes as needed for chest pain. 11/05/16  Yes Nahser, Wonda Cheng, MD  pantoprazole (PROTONIX) 40 MG tablet Take 1 tablet (40 mg total) by mouth daily. Please  make yearly appt with Dr. Acie Fredrickson for April. 1st attempt 09/06/17  Yes Nahser, Wonda Cheng, MD  potassium chloride SA (KLOR-CON M20) 20 MEQ tablet Take 1 tablet (20 mEq total) by mouth 2 (two) times daily. 08/04/17  Yes Nahser, Wonda Cheng, MD  azithromycin (ZITHROMAX) 250 MG tablet Take 1 tablet by mouth daily. 09/06/17   [provider]  tiotropium (SPIRIVA) 18 MCG inhalation capsule Place 1 capsule (18 mcg total) into inhaler and inhale daily. 07/05/13   Bonnielee Haff, MD    Review of Systems:  Constitutional: Denies fever, chills, diaphoresis, appetite change and fatigue.  HEENT: Denies photophobia, eye pain, redness, hearing loss, ear pain, congestion, sore throat, rhinorrhea, sneezing, mouth sores, trouble swallowing, neck pain, neck stiffness and tinnitus.   Respiratory: Denies ,  chest tightness,  and wheezing.   Cardiovascular: Denies chest pain, palpitations and leg swelling.  Gastrointestinal: Denies nausea, vomiting, abdominal pain, diarrhea, constipation, blood in stool and abdominal distention.  Genitourinary: Denies dysuria, urgency, frequency, hematuria, flank pain and difficulty urinating.  Endocrine: Denies: hot or cold intolerance, sweats, changes in hair or nails, polyuria,  polydipsia. Musculoskeletal: Denies myalgias, back pain, joint swelling, arthralgias and gait problem.  Skin: Denies pallor, rash and wound.  Neurological: Denies dizziness, seizures, syncope, weakness, light-headedness, numbness and headaches.  Hematological: Denies adenopathy. Easy bruising, personal or family bleeding history  Psychiatric/Behavioral: Denies suicidal ideation, mood changes, confusion, nervousness, sleep disturbance and agitation    Physical Exam: Vitals:   09/12/17 1704 09/12/17 1705 09/12/17 1731 09/12/17 1740  BP: 124/76   105/62  Pulse:  84  86  Resp:    20  Temp:    98.1 F (36.7 C)  TempSrc:    Oral  SpO2:  97%  97%  Weight:   117.4 kg (258 lb 13.1 oz)   Height:   5\' 11"  (1.803 m)       Constitutional: NAD, calm, comfortable Eyes: PERRL, lids and conjunctivae normal ENMT: Mucous membranes are moist. Posterior pharynx clear of any exudate or lesions.Normal dentition.  Neck: normal, supple, no masses, no thyromegaly Respiratory: Coarse bilateral breath sounds, positive rhonchi and expiratory wheezes. Cardiovascular: Regular rate and rhythm, no murmurs / rubs / gallops. No extremity edema. 2+ pedal pulses. No carotid bruits.  Abdomen: no tenderness, no masses palpated. No hepatosplenomegaly. Bowel sounds positive.  Musculoskeletal: no clubbing / cyanosis. No joint deformity upper and lower extremities. Good ROM, no contractures. Normal muscle tone.  Skin: no rashes, lesions, ulcers. No induration Neurologic: CN 2-12 grossly intact. Sensation intact, DTR normal. Strength 5/5 in all 4.  Psychiatric: Normal judgment and insight. Alert and oriented x 3. Normal mood.    Labs on Admission: I have personally reviewed the following labs and imaging studies  CBC: Recent Labs  Lab 09/12/17 1157  WBC 11.3*  HGB 14.3  HCT 47.6  MCV 98.3  PLT 270   Basic Metabolic Panel: Recent Labs  Lab 09/12/17 1157  NA 140  K 3.8  CL 91*  CO2 39*  GLUCOSE 195*   BUN 28*  CREATININE 1.62*  CALCIUM 8.6*   GFR: Estimated Creatinine Clearance: 43.4 mL/min (A) (by C-G formula based on SCr of 1.62 mg/dL (H)). Liver Function Tests: No results for input(s): AST, ALT, ALKPHOS, BILITOT, PROT, ALBUMIN in the last 168 hours. No results for input(s): LIPASE, AMYLASE in the last 168 hours. No results for input(s): AMMONIA in the last 168 hours. Coagulation Profile: No results for input(s): INR, PROTIME in the last 168  hours. Cardiac Enzymes: Recent Labs  Lab 09/12/17 1157  TROPONINI <0.03   BNP (last 3 results) No results for input(s): PROBNP in the last 8760 hours. HbA1C: No results for input(s): HGBA1C in the last 72 hours. CBG: No results for input(s): GLUCAP in the last 168 hours. Lipid Profile: No results for input(s): CHOL, HDL, LDLCALC, TRIG, CHOLHDL, LDLDIRECT in the last 72 hours. Thyroid Function Tests: No results for input(s): TSH, T4TOTAL, FREET4, T3FREE, THYROIDAB in the last 72 hours. Anemia Panel: No results for input(s): VITAMINB12, FOLATE, FERRITIN, TIBC, IRON, RETICCTPCT in the last 72 hours. Urine analysis:    Component Value Date/Time   COLORURINE YELLOW 11/20/2015 1400   APPEARANCEUR CLEAR 11/20/2015 1400   LABSPEC <1.005 (L) 11/20/2015 1400   PHURINE 5.5 11/20/2015 1400   GLUCOSEU >1000 (A) 11/20/2015 1400   HGBUR NEGATIVE 11/20/2015 1400   BILIRUBINUR NEGATIVE 11/20/2015 1400   KETONESUR NEGATIVE 11/20/2015 1400   PROTEINUR NEGATIVE 11/20/2015 1400   UROBILINOGEN 0.2 10/05/2014 1300   NITRITE NEGATIVE 11/20/2015 1400   LEUKOCYTESUR NEGATIVE 11/20/2015 1400   Sepsis Labs: @LABRCNTIP (procalcitonin:4,lacticidven:4) )No results found for this or any previous visit (from the past 240 hour(s)).   Radiological Exams on Admission: Dg Chest 2 View  Result Date: 09/12/2017 CLINICAL DATA:  84 year old male with 3 days of chest pain. Substernal pain radiating to the left side. Productive cough. EXAM: CHEST  2 VIEW  COMPARISON:  10/06/2014 chest radiographs and earlier. FINDINGS: Stable prior CABG. Stable mediastinal contours. Stable to mildly improved lung volumes compared to 2016. No pneumothorax, pulmonary edema, pleural effusion or consolidation. Chronic lower lobe streaky opacity is stable to improved. No acute osseous abnormality identified. Negative visible bowel gas pattern. IMPRESSION: No acute cardiopulmonary abnormality. Electronically Signed   By: Genevie Ann M.D.   On: 09/12/2017 12:23    EKG: Independently reviewed.  Sinus rhythm at a rate of 90, do not see any acute ischemic changes although EKG reading makes mention of possible anterior ischemia  Assessment/Plan Principal Problem:   COPD with acute exacerbation (HCC) Active Problems:   Chest pain   OBESITY   CAD, NATIVE VESSEL   Type 2 diabetes mellitus (HCC)   Chronic diastolic CHF (congestive heart failure) (HCC)   HTN (hypertension)   HLD (hyperlipidemia)   CKD (chronic kidney disease), stage III (HCC)   Atrial fibrillation (HCC)    Chest pain -Believe this is likely secondary to COPD exacerbation with coughing and likely muscle pain in addition to anxiety from lack of oxygen. -Nonetheless given history of coronary artery disease we will repeat EKG in a.m., place on telemetry and cycle troponins. -Echo from 2016 is reviewed: at that time he was noted to have an ejection fraction of 45-50% with grade 1 diastolic dysfunction and no wall motion abnormalities. -Believe it is time to repeat his echo.  COPD with acute exacerbation/chronic hypoxemic respiratory failure -Continue oxygen. -Placed on IV steroids, azithromycin empirically, frequent nebs. -Check influenza PCR.  Chronic combined heart failure -Echo results as above. -Clinically compensated.  Stage III chronic kidney disease -Creatinine is currently better than his baseline of 1.8-1.9 at 1.6.  Atrial fibrillation -Not chronically anticoagulated given history of GI  bleeding, he is rate controlled at present.  And he is actually in sinus rhythm.  Hypothyroidism -Check TSH, continue home dose of Synthroid.  Hyperlipidemia -Continue Lipitor.  Type 2 diabetes -Check A1c, continue Amaryl, place on sensitive sliding scale while in the hospital   DVT prophylaxis: Lovenox Code Status: Full code  Family Communication: Daughter at bedside updated on plan of care and all questions answered Disposition Plan: Anticipate discharge home over next 24-48 hours with symptomatic improvement Consults called: None Admission status: Inpatient   Time Spent: 85 minutes  Estela Isaac Bliss MD [facility] Pager [phone]  If 7PM-7AM, please contact night-coverage [email or web address] Password [email or web address]  09/12/2017, 6:18 PM

## 2017-09-12 NOTE — ED Triage Notes (Signed)
Pt has been having chest pains for the last 3 days. Pain is substernal and has now moved over to left breast. Has had 4 baby aspirin and I sublingual Nitro. Still rates pain 6/10.   BP dropped down to 98 systolic after he was given 1 Nitro  History of CHF COPD and HTN

## 2017-09-12 NOTE — ED Provider Notes (Signed)
[facility] EMERGENCY DEPARTMENT Provider Note   CSN: [id number] Arrival date & time: 09/12/17  1144     History   Chief Complaint Chief Complaint  Patient presents with  . Chest Pain    HPI TRAYVEON BECKFORD is a 84 y.o. male.  Patient complains of shortness of breath and chest pain.   The history is provided by the patient. No language interpreter was used.  Chest Pain   This is a new problem. The current episode started 2 days ago. The problem occurs rarely. The problem has been resolved. The pain is associated with exertion. The pain is present in the substernal region. The pain is at a severity of 6/10. The pain is moderate. Pertinent negatives include no abdominal pain, no back pain, no cough and no headaches.  Pertinent negatives for past medical history include no seizures.    Past Medical History:  Diagnosis Date  . Acute blood loss anemia    a. 06/2013 due to GIB.  Marland Kitchen Arthritis    "knees; hands" (07/11/2013)  . Asthma   . CAD (coronary artery disease)    a. s/p CABG x 3 in 1995; b.  NSTEMI 12/2011 -> Cath 6/21: LM 20-30, LAD 90, RI 95, LCX 42m, RCA 90/147m (treated w/ 2.0x16 Promus DES), VG->RCA ok, VG->RI 70-47m (treated w/ 4.0x28 Promus), LIMA->LAD ok, EF 55-60%.  . Chronic diastolic CHF (congestive heart failure) ([place])    a. Echo 10/2011: EF 55-60%;  b. elevated EDP req diuresis.  . Chronic respiratory failure ([place]) 07/11/2013   a. 3L home O2 24/7.  Marland Kitchen CKD (chronic kidney disease)    a. ?Based on Cr 2013 - 1.3-1.7.  . COPD (chronic obstructive pulmonary disease) ([place])    a. on home O2  . GERD (gastroesophageal reflux disease)   . GI bleed    a. 06/2013 adm - prepyloric ulcer, path neg for malignancy. A/w ABL anemia.  Marland Kitchen HLD (hyperlipidemia)   . HOH (hard of hearing)    left ear  . HTN (hypertension)   . Hypothyroidism   . Morbid obesity ([place])   . Orthopnea    a. Has been sleeping in a recliner x 30 yrs.  . Sleep apnea   . Syncope     Patient Active  Problem List   Diagnosis Date Noted  . Chronic combined systolic and diastolic CHF (congestive heart failure) ([place]) 01/29/2016  . History of recent fall 10/17/2015  . Risk for falls 10/17/2015  . Recurrent falls 10/17/2015  . Chest pain at rest   . DNR (do not resuscitate) 10/23/2014  . Thrush, oral 10/10/2014  . Sinusitis, chronic 10/07/2014  . Bronchitis, acute 10/06/2014  . Acute kidney injury ([place])   . Atrial fibrillation, unspecified   . Acute on chronic diastolic heart failure ([place]) 10/04/2014  . Chest pain 10/04/2014  . New onset atrial fibrillation ([place]) 10/04/2014  . COPD exacerbation ([place]) 10/04/2014  . COLD (chronic obstructive lung disease) ([place])   . 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 GI bleed   . Acute on chronic diastolic CHF (congestive heart failure) ([place]) 08/07/2013  . COPD (chronic obstructive pulmonary disease) ([place]) 08/07/2013  . CKD (chronic kidney disease), stage III ([place]) 08/07/2013  . Acute blood loss anemia   . Elevated serum creatinine 07/11/2013  . Chronic respiratory failure ([place]) 07/11/2013  . Chronic diastolic CHF (congestive heart failure) ([place])   . CAD (coronary artery disease)   . HTN (hypertension)   . HLD (hyperlipidemia)   . History  of acute myocardial infarction 01/06/2012  . Type 2 diabetes mellitus ([place]) 01/06/2012  . Obstructive chronic bronchitis without exacerbation ([place]) 10/16/2010  . DIZZINESS 08/06/2010  . CAD, NATIVE VESSEL 01/27/2010  . CAD, ARTERY BYPASS GRAFT 01/27/2010  . OBESITY 01/24/2009    Past Surgical History:  Procedure Laterality Date  . [place]   "when I was in the [place]" (07/11/2013)  . CORONARY ANGIOPLASTY WITH STENT PLACEMENT  12/2011  . CORONARY ARTERY BYPASS GRAFT  1995   CABG X3  . ESOPHAGOGASTRODUODENOSCOPY N/A 07/12/2013   Procedure: ESOPHAGOGASTRODUODENOSCOPY (EGD);  Surgeon: Inda Castle, MD;  Location: [place];  Service: Endoscopy;  Laterality: N/A;  . EXCISIONAL  HEMORRHOIDECTOMY     "twice cut them out; burnt them out once"  . FUNCTIONAL ENDOSCOPIC SINUS SURGERY  2006  . HYDROCELE EXCISION  2005  . INGUINAL HERNIA REPAIR Right 2006  . KNEE ARTHROSCOPY Right 1990's  . LEFT HEART CATHETERIZATION WITH CORONARY/GRAFT ANGIOGRAM  01/07/2012   Procedure: LEFT HEART CATHETERIZATION WITH Beatrix Fetters;  Surgeon: Peter M Martinique, MD;  Location: [facility] CATH LAB;  Service: Cardiovascular;;  . PERCUTANEOUS CORONARY STENT INTERVENTION (PCI-S) N/A 01/10/2012   Procedure: PERCUTANEOUS CORONARY STENT INTERVENTION (PCI-S);  Surgeon: Peter M Martinique, MD;  Location: [facility] CATH LAB;  Service: Cardiovascular;  Laterality: N/A;  . [place] Medications    Prior to Admission medications   Medication Sig Start Date End Date Taking? Authorizing Provider  albuterol (PROVENTIL HFA;VENTOLIN HFA) 108 (90 BASE) MCG/ACT inhaler Inhale 2 puffs into the lungs every 6 (six) hours as needed for wheezing or shortness of breath.   Yes [provider]  albuterol (PROVENTIL) (2.5 MG/3ML) 0.083% nebulizer solution Take 3 mLs (2.5 mg total) by nebulization every 6 (six) hours as needed for wheezing. 07/05/13  Yes Bonnielee Haff, MD  ARTIFICIAL TEAR OP Place 2 drops into both eyes daily as needed. For dry eyes   Yes [provider]  aspirin 81 MG tablet Take 1 tablet (81 mg total) by mouth daily. 08/10/13  Yes Dunn, Dayna N, PA-C  atorvastatin (LIPITOR) 80 MG tablet Take 1 tablet (80 mg total) by mouth daily at 6 PM. 08/04/17  Yes Nahser, Wonda Cheng, MD  budesonide (PULMICORT) 0.5 MG/2ML nebulizer solution Take 0.5 mg by nebulization 2 (two) times daily.   Yes [provider]  budesonide-formoterol (SYMBICORT) 160-4.5 MCG/ACT inhaler Inhale 2 puffs into the lungs 2 (two) times daily.   Yes [provider]  BYSTOLIC 10 MG tablet TAKE ONE TABLET BY MOUTH ONCE DAILY 05/01/14  Yes Nahser, Wonda Cheng, MD  clopidogrel (PLAVIX) 75 MG tablet  Take 1 tablet (75 mg total) by mouth daily with breakfast. 08/04/17  Yes Nahser, Wonda Cheng, MD  fluticasone (FLONASE) 50 MCG/ACT nasal spray Place 2 sprays into both nostrils daily. 10/13/14  Yes Short, Noah Delaine, MD  furosemide (LASIX) 40 MG tablet Take 1.5 tablets (60 mg total) by mouth 2 (two) times daily. 06/19/15  Yes Nahser, Wonda Cheng, MD  glimepiride (AMARYL) 2 MG tablet Take 2 mg by mouth daily with breakfast.   Yes Lawerance Cruel, MD  ipratropium (ATROVENT) 0.02 % nebulizer solution Take 0.5 mg by nebulization every 6 (six) hours as needed for wheezing or shortness of breath.   Yes [provider]  isosorbide mononitrate (IMDUR) 30 MG 24 hr tablet Take 1 tablet (30 mg total) by mouth daily. 08/04/17  Yes Nahser, Wonda Cheng, MD  montelukast (SINGULAIR) 10 MG tablet Take 10 mg by mouth at bedtime.   Yes [provider]  Multiple Vitamins-Minerals (MULTIVITAMIN & MINERAL PO) Take 1 tablet by mouth daily.     Yes [provider]  nitroGLYCERIN (NITROSTAT) 0.4 MG SL tablet Place 1 tablet (0.4 mg total) under the tongue every 5 (five) minutes as needed for chest pain. 11/05/16  Yes Nahser, Wonda Cheng, MD  pantoprazole (PROTONIX) 40 MG tablet Take 1 tablet (40 mg total) by mouth daily. Please make yearly appt with Dr. Acie Fredrickson for April. 1st attempt 09/06/17  Yes Nahser, Wonda Cheng, MD  potassium chloride SA (KLOR-CON M20) 20 MEQ tablet Take 1 tablet (20 mEq total) by mouth 2 (two) times daily. 08/04/17  Yes Nahser, Wonda Cheng, MD  azithromycin (ZITHROMAX) 250 MG tablet Take 1 tablet by mouth daily. 09/06/17   [provider]  tiotropium (SPIRIVA) 18 MCG inhalation capsule Place 1 capsule (18 mcg total) into inhaler and inhale daily. 07/05/13   Bonnielee Haff, MD    Family History Family History  Problem Relation Age of Onset  . CAD Father 24    Social History Social History   Tobacco Use  . Smoking status: Never Smoker  . Smokeless tobacco: Never Used  Substance Use  Topics  . Alcohol use: Yes    Comment: prior alcohol, no sig use now  . Drug use: No     Allergies   Other   Review of Systems Review of Systems  Constitutional: Negative for appetite change and fatigue.  HENT: Negative for congestion, ear discharge and sinus pressure.   Eyes: Negative for discharge.  Respiratory: Positive for wheezing. Negative for cough.   Cardiovascular: Positive for chest pain.  Gastrointestinal: Negative for abdominal pain and diarrhea.  Genitourinary: Negative for frequency and hematuria.  Musculoskeletal: Negative for back pain.  Skin: Negative for rash.  Neurological: Negative for seizures and headaches.  Psychiatric/Behavioral: Negative for hallucinations.     Physical Exam Updated Vital Signs BP 100/68   Pulse 91   Temp 98.4 F (36.9 C) (Oral)   Resp 20   Ht 5\' 11"  (1.803 m)   Wt 127 kg (280 lb)   SpO2 100%   BMI 39.05 kg/m   Physical Exam  Constitutional: He is oriented to person, place, and time. He appears well-developed.  HENT:  Head: Normocephalic.  Eyes: Conjunctivae and EOM are normal. No scleral icterus.  Neck: Neck supple. No thyromegaly present.  Cardiovascular: Normal rate and regular rhythm. Exam reveals no gallop and no friction rub.  No murmur heard. Pulmonary/Chest: No stridor. He has wheezes. He has no rales. He exhibits no tenderness.  Abdominal: He exhibits no distension. There is no tenderness. There is no rebound.  Musculoskeletal: Normal range of motion. He exhibits no edema.  Lymphadenopathy:    He has no cervical adenopathy.  Neurological: He is oriented to person, place, and time. He exhibits normal muscle tone. Coordination normal.  Skin: No rash noted. No erythema.  Psychiatric: He has a normal mood and affect. His behavior is normal.     ED Treatments / Results  Labs (all labs ordered are listed, but only abnormal results are displayed) Labs Reviewed  BASIC METABOLIC PANEL - Abnormal; Notable for the  following components:      Result Value   Chloride 91 (*)    CO2 39 (*)    Glucose, Bld 195 (*)    BUN 28 (*)    Creatinine, Ser 1.62 (*)  Calcium 8.6 (*)    GFR calc non Af Amer 37 (*)    GFR calc Af Amer 43 (*)    All other components within normal limits  CBC - Abnormal; Notable for the following components:   WBC 11.3 (*)    All other components within normal limits  TROPONIN I    EKG  EKG Interpretation  Date/Time:  Monday September 12 2017 11:54:30 EST Ventricular Rate:  90 PR Interval:    QRS Duration: 103 QT Interval:  351 QTC Calculation: 430 R Axis:   -54 Text Interpretation:  Sinus rhythm Prolonged PR interval Inferior infarct, old Consider anterior infarct Lateral leads are also involved Confirmed by Milton Ferguson [phone]) on 09/12/2017 1:06:09 PM       Radiology Dg Chest 2 View  Result Date: 09/12/2017 CLINICAL DATA:  84 year old male with 3 days of chest pain. Substernal pain radiating to the left side. Productive cough. EXAM: CHEST  2 VIEW COMPARISON:  10/06/2014 chest radiographs and earlier. FINDINGS: Stable prior CABG. Stable mediastinal contours. Stable to mildly improved lung volumes compared to 2016. No pneumothorax, pulmonary edema, pleural effusion or consolidation. Chronic lower lobe streaky opacity is stable to improved. No acute osseous abnormality identified. Negative visible bowel gas pattern. IMPRESSION: No acute cardiopulmonary abnormality. Electronically Signed   By: Genevie Ann M.D.   On: 09/12/2017 12:23    Procedures Procedures (including critical care time)  Medications Ordered in ED Medications  ipratropium-albuterol (DUONEB) 0.5-2.5 (3) MG/3ML nebulizer solution 3 mL (3 mLs Nebulization Given 09/12/17 1352)  albuterol (PROVENTIL) (2.5 MG/3ML) 0.083% nebulizer solution 2.5 mg (2.5 mg Nebulization Given 09/12/17 1353)     Initial Impression / Assessment and Plan / ED Course  I have reviewed the triage vital signs and the nursing  notes.  Pertinent labs & imaging results that were available during my care of the patient were reviewed by me and considered in my medical decision making (see chart for details).     Patient with a history of coronary artery disease.  Patient having chest pain off and on.  Could be worsening of his coronary artery disease.  Patient also have exacerbation of COPD he will be admitted to medicine  Final Clinical Impressions(s) / ED Diagnoses   Final diagnoses:  Chest pain due to myocardial ischemia, unspecified ischemic chest pain type    ED Discharge Orders    None       Milton Ferguson, MD 09/12/17 1437

## 2017-09-13 ENCOUNTER — Observation Stay (HOSPITAL_BASED_OUTPATIENT_CLINIC_OR_DEPARTMENT_OTHER): Payer: Medicare Other

## 2017-09-13 DIAGNOSIS — J441 Chronic obstructive pulmonary disease with (acute) exacerbation: Secondary | ICD-10-CM | POA: Diagnosis not present

## 2017-09-13 DIAGNOSIS — R072 Precordial pain: Secondary | ICD-10-CM | POA: Diagnosis not present

## 2017-09-13 LAB — BASIC METABOLIC PANEL
ANION GAP: 12 (ref 5–15)
BUN: 28 mg/dL — ABNORMAL HIGH (ref 6–20)
CO2: 34 mmol/L — AB (ref 22–32)
Calcium: 8.4 mg/dL — ABNORMAL LOW (ref 8.9–10.3)
Chloride: 93 mmol/L — ABNORMAL LOW (ref 101–111)
Creatinine, Ser: 1.65 mg/dL — ABNORMAL HIGH (ref 0.61–1.24)
GFR calc Af Amer: 42 mL/min — ABNORMAL LOW (ref >60)
GFR calc non Af Amer: 36 mL/min — ABNORMAL LOW (ref >60)
GLUCOSE: 138 mg/dL — AB (ref 65–99)
POTASSIUM: 4 mmol/L (ref 3.5–5.1)
Sodium: 139 mmol/L (ref 135–145)

## 2017-09-13 LAB — HEMOGLOBIN A1C
Hgb A1c MFr Bld: 6.3 % — ABNORMAL HIGH (ref 4.8–5.6)
MEAN PLASMA GLUCOSE: 134.11 mg/dL

## 2017-09-13 LAB — CBC
HEMATOCRIT: 44.7 % (ref 39.0–52.0)
HEMOGLOBIN: 13.4 g/dL (ref 13.0–17.0)
MCH: 29.5 pg (ref 26.0–34.0)
MCHC: 30 g/dL (ref 30.0–36.0)
MCV: 98.5 fL (ref 78.0–100.0)
Platelets: 174 10*3/uL (ref 150–400)
RBC: 4.54 MIL/uL (ref 4.22–5.81)
RDW: 14.2 % (ref 11.5–15.5)
WBC: 9.5 10*3/uL (ref 4.0–10.5)

## 2017-09-13 LAB — GLUCOSE, CAPILLARY
GLUCOSE-CAPILLARY: 149 mg/dL — AB (ref 65–99)
GLUCOSE-CAPILLARY: 240 mg/dL — AB (ref 65–99)

## 2017-09-13 LAB — ECHOCARDIOGRAM COMPLETE
HEIGHTINCHES: 71 in
Weight: 4141.12 oz

## 2017-09-13 LAB — TROPONIN I: Troponin I: <0.03 ng/mL (ref <0.03)

## 2017-09-13 LAB — TSH: TSH: 2.69 u[IU]/mL (ref 0.350–4.500)

## 2017-09-13 MED ORDER — PREDNISONE 10 MG PO TABS: 10.0000 mg | ORAL_TABLET | Freq: Every day | ORAL | 0 refills | Status: DC

## 2017-09-13 MED ORDER — AZITHROMYCIN 250 MG PO TABS: 250.0000 mg | ORAL_TABLET | Freq: Every day | ORAL | 0 refills | Status: AC

## 2017-09-13 MED ORDER — PERFLUTREN LIPID MICROSPHERE
1.0000 mL | INTRAVENOUS | Status: AC | PRN
  Administered 2017-09-13: 2 mL via INTRAVENOUS

## 2017-09-13 NOTE — Evaluation (Signed)
Physical Therapy Evaluation Patient Details Name: KERRIE LATOUR MRN: [id number] DOB: April 19, 1931 Today's Date: 09/13/2017   History of Present Illness  [name] is an 84yo white male who comes to [facility] on 2/25 p 10d URI symptoms, SOB , adn eventual Left CP. Pt admitted in COPD exacerbation: PMH: CHF, COPD on 5L at home, CKD3, CAD, HOH, syncope, HTN, CBAG.   Clinical Impression  Pt admitted with above diagnosis. Pt currently with functional limitations due to the deficits listed below (see "PT Problem List"). Upon entry, the patient is received seated at EOB, no family/caregiver present. The pt is awake and agreeable to participate. Pt c/o cont Lt CP and mild anxiety, as well as SOB. The pt is alert and oriented x4, pleasant, conversational, and following simple commands consistently, but he does exhibit some difficult answering questions appropriately, difficulty providing detail at home, significant short term memory defcits, and intermittent mild anomia, forgetting words for things like "walker" and "ramp." Pt received on 6LPM O2 c SpO2: 94% during all mobility. Functional mobility assessment demonstrates heavy strength impairment in BUE/BLE, the pt now requiring near-maximal effort to perform transfers, whereas the patient performed these at a higher level of independence PTA. Pt reports he feels "very much" more weak than his baseline. Empirically, the patient demonstrates increased risk of recurrent falls AEB gait speed <0.54m/s, forward reach <5", and multiple recent falls at home. It sounds as though the patient performs mostly household distance AMB, hence return to home is favorable, granted 24/7 supervision d/t cognitive deficits. Additional corroboration of home set-up is needed when family is available. Pt will benefit from skilled PT intervention to increase independence and safety with basic mobility in preparation for discharge to the venue listed below.       Follow Up Recommendations  Supervision/Assistance - 24 hour;Home health PT;Supervision for mobility/OOB(No safe to be alone at home; significan tcognitive deficits, acute weakness, 3 falls last week)    Equipment Recommendations  None recommended by PT    Recommendations for Other Services       Precautions / Restrictions Precautions Precautions: Fall Restrictions Weight Bearing Restrictions: No      Mobility  Bed Mobility               General bed mobility comments: seated EOB upon arrival   Transfers Overall transfer level: Needs assistance Equipment used: 1 person hand held assist Transfers: Sit to/from Stand Sit to Stand: From elevated surface;Min guard         General transfer comment: 5xSTS in 46 seconds with moderte BUE asssitance  Ambulation/Gait Ambulation/Gait assistance: (not appropriate at this time. )              Stairs            Wheelchair Mobility    Modified Rankin (Stroke Patients Only)       Balance Overall balance assessment: History of Falls;Mild deficits observed, not formally tested(3 falls last week. )                                           Pertinent Vitals/Pain Pain Assessment: Faces Faces Pain Scale: Hurts little more Pain Location: Lt CP  Pain Descriptors / Indicators: Aching Pain Intervention(s): Limited activity within patient's tolerance;Monitored during session    Home Living Family/patient expects to be discharged to:: Private residence Living Arrangements: (daughter) Available Help at Discharge: (  daughter lives there, works at [employer] as needed, additional family and [place] nearby assist. ) Type of Home: House Home Access: Ramped entrance     [place]: One Tripp: Environmental consultant - 4 wheels(not extensively asked d/t HOH and cognitive defcitiis)      Prior Function Level of Independence: Independent with assistive device(s);Needs assistance   Gait / Transfers Assistance Needed: Rollator in home,  O2 at all times, Daughter assists with bathing/dressing.            Hand Dominance   Dominant Hand: Right    Extremity/Trunk Assessment   Upper Extremity Assessment Upper Extremity Assessment: Generalized weakness    Lower Extremity Assessment Lower Extremity Assessment: Generalized weakness       Communication   Communication: HOH  Cognition Arousal/Alertness: Awake/alert Behavior During Therapy: WFL for tasks assessed/performed Overall Cognitive Status: Difficult to assess                                 General Comments: sems soemwaht confused at times, doesnt remember eating breakfast, and doesn't remember that he was just told the he already ate breakfast      General Comments      Exercises     Assessment/Plan    PT Assessment Patient needs continued PT services  PT Problem List Cardiopulmonary status limiting activity;Decreased mobility;Decreased activity tolerance;Decreased strength;Obesity       PT Treatment Interventions Stair training;Gait training;Functional mobility training;Therapeutic activities;Therapeutic exercise;Balance training;Cognitive remediation;Patient/family education    PT Goals (Current goals can be found in the Care Plan section)  Acute Rehab PT Goals Patient Stated Goal: return home, feel less anxious PT Goal Formulation: With patient Time For Goal Achievement: 09-24-2017 Potential to Achieve Goals: Fair    Frequency Min 3X/week   Barriers to discharge Decreased caregiver support pt is not appropriate to be left alone at home, really needs 24/7 supervision for safety    Co-evaluation               AM-PAC PT "6 Clicks" Daily Activity  Outcome Measure Difficulty turning over in bed (including adjusting bedclothes, sheets and blankets)?: A Lot Difficulty moving from lying on back to sitting on the side of the bed? : A Lot Difficulty sitting down on and standing up from a chair with arms (e.g., wheelchair,  bedside commode, etc,.)?: A Lot Help needed moving to and from a bed to chair (including a wheelchair)?: A Lot Help needed walking in hospital room?: A Lot Help needed climbing 3-5 steps with a railing? : Total 6 Click Score: 11    End of Session Equipment Utilized During Treatment: Oxygen Activity Tolerance: Patient tolerated treatment well;Patient limited by fatigue Patient left: in chair;with chair alarm set Nurse Communication: Mobility status PT Visit Diagnosis: Unsteadiness on feet (R26.81);Difficulty in walking, not elsewhere classified (R26.2);Muscle weakness (generalized) (M62.81);History of falling (Z91.81)    Time: 2800-3491 PT Time Calculation (min) (ACUTE ONLY): 25 min   Charges:   PT Evaluation $PT Eval High Complexity: 1 High PT Treatments $Therapeutic Activity: 8-22 mins   PT G Codes:        12:19 PM, 09/24/17 Etta Grandchild, PT, DPT Physical Therapist - [place]  [phone] (Office)  Buccola,Allan C 09-24-2017, 12:11 PM

## 2017-09-13 NOTE — Care Management Note (Addendum)
Case Management Note  Patient Details  Name: [name] MRN: [id number] Date of Birth: 1930/10/19    Expected Discharge Date:  09/13/17               Expected Discharge Plan:  [place]  In-House Referral:     Discharge planning Services  CM Consult  Post Acute Care Choice:  Home Health Choice offered to:  Patient, Adult Children  DME Arranged:    DME Agency:     HH Arranged:  PT, RN, ST, [place] Agency:  [facility]  Status of Service:  Completed, signed off  If discussed at [place] of Stay Meetings, dates discussed:    Additional Comments: Patient discharging home today. Discussed with Lelon Frohlich, patient's daughter PT recommendations. Patient is from home alone, has RW, WC, per daughter patient does not utilize. Had recent fall, found in floor by neighbor. Patient and daughter both agreeable to [place]. We discuss 24/7 supervision as recommended. Daughter plans to talk with patient and siblings and make arrangements. We discuss option for LTC. Ann plans to check to see if patient will qualify for Medicaid. Juliann Pulse of [facility] will deliver a port tank for transport home. Juliann Pulse of [facility] will obtain orders from [employer] for Home health. Will order RN, PT, SW and ST for cognition issues.  Aquila Menzie, Chauncey [place], RN 09/13/2017, 1:12 PM

## 2017-09-13 NOTE — Discharge Summary (Signed)
Physician Discharge Summary  [name] CZY:[id number] DOB: Aug 11, 1930 DOA: 09/12/2017  PCP: Lawerance Cruel, MD  Admit date: 09/12/2017 Discharge date: 09/13/2017  Time spent: 45 minutes  Recommendations for Outpatient Follow-up:  -To be discharged home today.   -Advised to follow-up with primary care provider in 2 weeks.  Discharge Diagnoses:  Principal Problem:   COPD with acute exacerbation ([place]) Active Problems:   Chest pain   OBESITY   CAD, NATIVE VESSEL   Type 2 diabetes mellitus (HCC)   Chronic diastolic CHF (congestive heart failure) (HCC)   HTN (hypertension)   HLD (hyperlipidemia)   CKD (chronic kidney disease), stage III (HCC)   Atrial fibrillation [facility])   Discharge Condition: Stable and improved  Filed Weights   09/12/17 1150 09/12/17 1731  Weight: 127 kg (280 lb) 117.4 kg (258 lb 13.1 oz)    History of present illness:  [name] is a 84 y.o. male with history of COPD on chronic oxygen, chronic congestive heart failure, stage III chronic kidney disease and coronary artery disease among other issues.  He has been sick for about 10 days with URI symptoms.  He saw his PCP a week ago who gave him a steroid shot and sent him home with a prednisone taper with a presumed diagnosis of bronchitis.  He was not given antibiotics per the daughter.  Patient does not recall having an influenza swab.  He has been having issues with his oxygen at home, his tanks have not been working, his oxygen company has been out a few times and supposedly the issue has been resolved, however last night at about 10 PM he noticed that his oxygen was not working became very short of breath and panicked.  He then developed left-sided chest pain without radiation that only resolved after nitroglycerin given in the ED.  Has also had a cough productive of yellow sputum for the past 7-10 days.  In the ED his vital signs are stable, labs are significant for creatinine of 1.6 which is  actually better than his baseline of around 1.8-1.9, WBC count is 11.3, chest x-ray with no acute cardiopulmonary abnormalities.  Admission is requested.    Hospital Course:   Chest pain -Likely secondary to COPD exacerbation with coughing and muscle pain in addition to anxiety from lack of oxygen. -Has ruled out for ACS. -Repeat echo with ejection fraction of 55-60% with grade 1 diastolic dysfunction and normal wall motion.  COPD with acute exacerbation -Clinically improved, afebrile, back on home oxygen, will discharge on prednisone taper, has 3 days remaining of azithromycin.  Chronic combined heart failure -Echo results as above. -Clinically compensated.  Stage III chronic kidney disease -Creatinine is currently better than his baseline of 1.8-1.9 at 1.65.  Atrial fibrillation -Not chronically anticoagulated given history of GI bleeding, he is rate controlled at present.  And he is actually in sinus rhythm.  Hypothyroidism -TSH is 2.690, continue home dose of Synthroid.  Hyperlipidemia -Continue Lipitor.  Type 2 diabetes -Controlled. -A1C is 6.3    Procedures:  None   Consultations:  None  Discharge Instructions  Discharge Instructions    Diet - low sodium heart healthy   Complete by:  As directed    Increase activity slowly   Complete by:  As directed      Allergies as of 09/13/2017      Reactions   Other    Cream cheese-makes very sick      Medication List  TAKE these medications   albuterol 108 (90 Base) MCG/ACT inhaler Commonly known as:  PROVENTIL HFA;VENTOLIN HFA Inhale 2 puffs into the lungs every 6 (six) hours as needed for wheezing or shortness of breath.   albuterol (2.5 MG/3ML) 0.083% nebulizer solution Commonly known as:  PROVENTIL Take 3 mLs (2.5 mg total) by nebulization every 6 (six) hours as needed for wheezing.   ARTIFICIAL TEAR OP Place 2 drops into both eyes daily as needed. For dry eyes   aspirin 81 MG tablet Take  1 tablet (81 mg total) by mouth daily.   atorvastatin 80 MG tablet Commonly known as:  LIPITOR Take 1 tablet (80 mg total) by mouth daily at 6 PM.   azithromycin 250 MG tablet Commonly known as:  ZITHROMAX Take 1 tablet (250 mg total) by mouth daily for 4 days.   budesonide 0.5 MG/2ML nebulizer solution Commonly known as:  PULMICORT Take 0.5 mg by nebulization 2 (two) times daily.   budesonide-formoterol 160-4.5 MCG/ACT inhaler Commonly known as:  SYMBICORT Inhale 2 puffs into the lungs 2 (two) times daily.   BYSTOLIC 10 MG tablet Generic drug:  nebivolol TAKE ONE TABLET BY MOUTH ONCE DAILY   clopidogrel 75 MG tablet Commonly known as:  PLAVIX Take 1 tablet (75 mg total) by mouth daily with breakfast.   fluticasone 50 MCG/ACT nasal spray Commonly known as:  FLONASE Place 2 sprays into both nostrils daily.   furosemide 40 MG tablet Commonly known as:  LASIX Take 1.5 tablets (60 mg total) by mouth 2 (two) times daily.   glimepiride 2 MG tablet Commonly known as:  AMARYL Take 2 mg by mouth daily with breakfast.   ipratropium 0.02 % nebulizer solution Commonly known as:  ATROVENT Take 0.5 mg by nebulization every 6 (six) hours as needed for wheezing or shortness of breath.   isosorbide mononitrate 30 MG 24 hr tablet Commonly known as:  IMDUR Take 1 tablet (30 mg total) by mouth daily.   montelukast 10 MG tablet Commonly known as:  SINGULAIR Take 10 mg by mouth at bedtime.   MULTIVITAMIN & MINERAL PO Take 1 tablet by mouth daily.   nitroGLYCERIN 0.4 MG SL tablet Commonly known as:  NITROSTAT Place 1 tablet (0.4 mg total) under the tongue every 5 (five) minutes as needed for chest pain.   pantoprazole 40 MG tablet Commonly known as:  PROTONIX Take 1 tablet (40 mg total) by mouth daily. Please make yearly appt with Dr. Acie Fredrickson for April. 1st attempt   potassium chloride SA 20 MEQ tablet Commonly known as:  KLOR-CON M20 Take 1 tablet (20 mEq total) by mouth 2  (two) times daily.   predniSONE 10 MG tablet Commonly known as:  DELTASONE Take 1 tablet (10 mg total) by mouth daily with breakfast. Take 6 tablets today and then decrease by 1 tablet daily until none are left.   tiotropium 18 MCG inhalation capsule Commonly known as:  SPIRIVA Place 1 capsule (18 mcg total) into inhaler and inhale daily.      Allergies  Allergen Reactions  . Other     Cream cheese-makes very sick   Follow-up Information    Lawerance Cruel, MD. Schedule an appointment as soon as possible for a visit in 2 week(s).   Specialty:  Family Medicine Why:  Hospital follow-up appointment on Tuesday, September 27, 2017 at 8:30 a.m. Contact information: [address] [place] [address] [phone]  The results of significant diagnostics from this hospitalization (including imaging, microbiology, ancillary and laboratory) are listed below for reference.    Significant Diagnostic Studies: Dg Chest 2 View  Result Date: 09/12/2017 CLINICAL DATA:  84 year old male with 3 days of chest pain. Substernal pain radiating to the left side. Productive cough. EXAM: CHEST  2 VIEW COMPARISON:  10/06/2014 chest radiographs and earlier. FINDINGS: Stable prior CABG. Stable mediastinal contours. Stable to mildly improved lung volumes compared to 2016. No pneumothorax, pulmonary edema, pleural effusion or consolidation. Chronic lower lobe streaky opacity is stable to improved. No acute osseous abnormality identified. Negative visible bowel gas pattern. IMPRESSION: No acute cardiopulmonary abnormality. Electronically Signed   By: Genevie Ann M.D.   On: 09/12/2017 12:23    Microbiology: No results found for this or any previous visit (from the past 240 hour(s)).   Labs: Basic Metabolic Panel: Recent Labs  Lab 09/12/17 1157 09/13/17 0519  NA 140 139  K 3.8 4.0  CL 91* 93*  CO2 39* 34*  GLUCOSE 195* 138*  BUN 28* 28*  CREATININE 1.62* 1.65*  CALCIUM 8.6* 8.4*    Liver Function Tests: No results for input(s): AST, ALT, ALKPHOS, BILITOT, PROT, ALBUMIN in the last 168 hours. No results for input(s): LIPASE, AMYLASE in the last 168 hours. No results for input(s): AMMONIA in the last 168 hours. CBC: Recent Labs  Lab 09/12/17 1157 09/13/17 0519  WBC 11.3* 9.5  HGB 14.3 13.4  HCT 47.6 44.7  MCV 98.3 98.5  PLT 179 174   Cardiac Enzymes: Recent Labs  Lab 09/12/17 1157 09/12/17 2254 09/13/17 0519  TROPONINI <0.03 <0.03 <0.03   BNP: BNP (last 3 results) No results for input(s): BNP in the last 8760 hours.  ProBNP (last 3 results) No results for input(s): PROBNP in the last 8760 hours.  CBG: Recent Labs  Lab 09/12/17 2052 09/13/17 0718 09/13/17 1135  GLUCAP 173* 149* 240*       Signed:  Lelon Frohlich  [facility] Pager: [phone] 09/13/2017, 2:54 PM

## 2017-09-13 NOTE — Progress Notes (Signed)
*  PRELIMINARY RESULTS* Echocardiogram 2D Echocardiogram has been performed.  Leavy Cella 09/13/2017, 10:32 AM

## 2017-09-13 NOTE — Progress Notes (Signed)
EKG completed and placed on pts chart.  

## 2017-09-13 NOTE — Progress Notes (Signed)
[name] discharged Home per MD order.  Discharge instructions reviewed and discussed with the patient, all questions and concerns answered. Copy of instructions and scripts given to patient.  Allergies as of 09/13/2017      Reactions   Other    Cream cheese-makes very sick      Medication List    TAKE these medications   albuterol 108 (90 Base) MCG/ACT inhaler Commonly known as:  PROVENTIL HFA;VENTOLIN HFA Inhale 2 puffs into the lungs every 6 (six) hours as needed for wheezing or shortness of breath.   albuterol (2.5 MG/3ML) 0.083% nebulizer solution Commonly known as:  PROVENTIL Take 3 mLs (2.5 mg total) by nebulization every 6 (six) hours as needed for wheezing.   ARTIFICIAL TEAR OP Place 2 drops into both eyes daily as needed. For dry eyes   aspirin 81 MG tablet Take 1 tablet (81 mg total) by mouth daily.   atorvastatin 80 MG tablet Commonly known as:  LIPITOR Take 1 tablet (80 mg total) by mouth daily at 6 PM.   azithromycin 250 MG tablet Commonly known as:  ZITHROMAX Take 1 tablet (250 mg total) by mouth daily for 4 days.   budesonide 0.5 MG/2ML nebulizer solution Commonly known as:  PULMICORT Take 0.5 mg by nebulization 2 (two) times daily.   budesonide-formoterol 160-4.5 MCG/ACT inhaler Commonly known as:  SYMBICORT Inhale 2 puffs into the lungs 2 (two) times daily.   BYSTOLIC 10 MG tablet Generic drug:  nebivolol TAKE ONE TABLET BY MOUTH ONCE DAILY   clopidogrel 75 MG tablet Commonly known as:  PLAVIX Take 1 tablet (75 mg total) by mouth daily with breakfast.   fluticasone 50 MCG/ACT nasal spray Commonly known as:  FLONASE Place 2 sprays into both nostrils daily.   furosemide 40 MG tablet Commonly known as:  LASIX Take 1.5 tablets (60 mg total) by mouth 2 (two) times daily.   glimepiride 2 MG tablet Commonly known as:  AMARYL Take 2 mg by mouth daily with breakfast.   ipratropium 0.02 % nebulizer solution Commonly known as:  ATROVENT Take  0.5 mg by nebulization every 6 (six) hours as needed for wheezing or shortness of breath.   isosorbide mononitrate 30 MG 24 hr tablet Commonly known as:  IMDUR Take 1 tablet (30 mg total) by mouth daily.   montelukast 10 MG tablet Commonly known as:  SINGULAIR Take 10 mg by mouth at bedtime.   MULTIVITAMIN & MINERAL PO Take 1 tablet by mouth daily.   nitroGLYCERIN 0.4 MG SL tablet Commonly known as:  NITROSTAT Place 1 tablet (0.4 mg total) under the tongue every 5 (five) minutes as needed for chest pain.   pantoprazole 40 MG tablet Commonly known as:  PROTONIX Take 1 tablet (40 mg total) by mouth daily. Please make yearly appt with Dr. Acie Fredrickson for April. 1st attempt   potassium chloride SA 20 MEQ tablet Commonly known as:  KLOR-CON M20 Take 1 tablet (20 mEq total) by mouth 2 (two) times daily.   predniSONE 10 MG tablet Commonly known as:  DELTASONE Take 1 tablet (10 mg total) by mouth daily with breakfast. Take 6 tablets today and then decrease by 1 tablet daily until none are left.   tiotropium 18 MCG inhalation capsule Commonly known as:  SPIRIVA Place 1 capsule (18 mcg total) into inhaler and inhale daily.       Patients skin is clean, dry and intact, no evidence of skin break down. IV site discontinued and catheter  remains intact. Site without signs and symptoms of complications. Dressing and pressure applied.  Patient escorted to car by in a wheelchair,  no distress noted upon discharge.  [name] 09/13/2017 3:27 PM

## 2017-09-13 NOTE — Progress Notes (Signed)
Pt gets very confused at night. He keeps attempting to get up OOB not remembering to use the urinal. Pt unsteady on feet and a high fall risk. Bed alarm on. Pt re-educated about bed alarm, him being a high fall risk and RN needing to document in Intake and output.  Pt sat on side of bed to use urinal and he peed all over the floor. RN could not document this urine amount, documented as an occurence Will continue to monitor pt.

## 2017-09-13 NOTE — Care Management Obs Status (Signed)
[place] NOTIFICATION   Patient Details  Name: WINNIE BARSKY MRN: [id number] Date of Birth: July 10, 1931   Medicare Observation Status Notification Given:  Yes    Skai Lickteig, Chauncey [place], RN 09/13/2017, 11:48 AM

## 2017-10-23 ENCOUNTER — Other Ambulatory Visit: Payer: Self-pay | Admitting: Cardiovascular Disease

## 2017-11-01 ENCOUNTER — Other Ambulatory Visit: Payer: Self-pay | Admitting: Cardiovascular Disease

## 2017-11-02 ENCOUNTER — Other Ambulatory Visit: Payer: Self-pay | Admitting: Cardiovascular Disease

## 2017-12-12 ENCOUNTER — Inpatient Hospital Stay (HOSPITAL_COMMUNITY)
Admission: EM | Admit: 2017-12-12 | Discharge: 2017-12-15 | DRG: 243 | Disposition: A | Discharge status: Home Health | Payer: Medicare Other | Attending: Internal Medicine | Admitting: Internal Medicine

## 2017-12-12 ENCOUNTER — Emergency Department (HOSPITAL_COMMUNITY): Payer: Medicare Other

## 2017-12-12 ENCOUNTER — Encounter (HOSPITAL_COMMUNITY): Payer: Self-pay | Admitting: Emergency Medicine

## 2017-12-12 ENCOUNTER — Other Ambulatory Visit: Payer: Self-pay

## 2017-12-12 DIAGNOSIS — R220 Localized swelling, mass and lump, head: Secondary | ICD-10-CM | POA: Diagnosis not present

## 2017-12-12 DIAGNOSIS — Z7982 Long term (current) use of aspirin: Secondary | ICD-10-CM

## 2017-12-12 DIAGNOSIS — I495 Sick sinus syndrome: Secondary | ICD-10-CM | POA: Diagnosis present

## 2017-12-12 DIAGNOSIS — I441 Atrioventricular block, second degree: Secondary | ICD-10-CM | POA: Diagnosis not present

## 2017-12-12 DIAGNOSIS — Z8249 Family history of ischemic heart disease and other diseases of the circulatory system: Secondary | ICD-10-CM

## 2017-12-12 DIAGNOSIS — W19XXXA Unspecified fall, initial encounter: Secondary | ICD-10-CM | POA: Diagnosis not present

## 2017-12-12 DIAGNOSIS — E039 Hypothyroidism, unspecified: Secondary | ICD-10-CM | POA: Diagnosis present

## 2017-12-12 DIAGNOSIS — J9610 Chronic respiratory failure, unspecified whether with hypoxia or hypercapnia: Secondary | ICD-10-CM | POA: Diagnosis present

## 2017-12-12 DIAGNOSIS — N183 Chronic kidney disease, stage 3 unspecified: Secondary | ICD-10-CM | POA: Diagnosis present

## 2017-12-12 DIAGNOSIS — Z7902 Long term (current) use of antithrombotics/antiplatelets: Secondary | ICD-10-CM | POA: Diagnosis not present

## 2017-12-12 DIAGNOSIS — I5032 Chronic diastolic (congestive) heart failure: Secondary | ICD-10-CM | POA: Diagnosis not present

## 2017-12-12 DIAGNOSIS — Z955 Presence of coronary angioplasty implant and graft: Secondary | ICD-10-CM

## 2017-12-12 DIAGNOSIS — I130 Hypertensive heart and chronic kidney disease with heart failure and stage 1 through stage 4 chronic kidney disease, or unspecified chronic kidney disease: Secondary | ICD-10-CM | POA: Diagnosis present

## 2017-12-12 DIAGNOSIS — G4733 Obstructive sleep apnea (adult) (pediatric): Secondary | ICD-10-CM | POA: Diagnosis present

## 2017-12-12 DIAGNOSIS — H9190 Unspecified hearing loss, unspecified ear: Secondary | ICD-10-CM | POA: Diagnosis present

## 2017-12-12 DIAGNOSIS — E1122 Type 2 diabetes mellitus with diabetic chronic kidney disease: Secondary | ICD-10-CM | POA: Diagnosis not present

## 2017-12-12 DIAGNOSIS — J431 Panlobular emphysema: Secondary | ICD-10-CM | POA: Diagnosis not present

## 2017-12-12 DIAGNOSIS — I482 Chronic atrial fibrillation: Secondary | ICD-10-CM | POA: Diagnosis not present

## 2017-12-12 DIAGNOSIS — Z951 Presence of aortocoronary bypass graft: Secondary | ICD-10-CM | POA: Diagnosis not present

## 2017-12-12 DIAGNOSIS — R55 Syncope and collapse: Secondary | ICD-10-CM | POA: Diagnosis present

## 2017-12-12 DIAGNOSIS — Z6837 Body mass index (BMI) 37.0-37.9, adult: Secondary | ICD-10-CM

## 2017-12-12 DIAGNOSIS — Z91011 Allergy to milk products: Secondary | ICD-10-CM | POA: Diagnosis not present

## 2017-12-12 DIAGNOSIS — Z7951 Long term (current) use of inhaled steroids: Secondary | ICD-10-CM

## 2017-12-12 DIAGNOSIS — I5042 Chronic combined systolic (congestive) and diastolic (congestive) heart failure: Secondary | ICD-10-CM | POA: Diagnosis present

## 2017-12-12 DIAGNOSIS — E785 Hyperlipidemia, unspecified: Secondary | ICD-10-CM | POA: Diagnosis present

## 2017-12-12 DIAGNOSIS — J449 Chronic obstructive pulmonary disease, unspecified: Secondary | ICD-10-CM

## 2017-12-12 DIAGNOSIS — M170 Bilateral primary osteoarthritis of knee: Secondary | ICD-10-CM | POA: Diagnosis present

## 2017-12-12 DIAGNOSIS — S0101XA Laceration without foreign body of scalp, initial encounter: Secondary | ICD-10-CM | POA: Diagnosis present

## 2017-12-12 DIAGNOSIS — Z9181 History of falling: Secondary | ICD-10-CM

## 2017-12-12 DIAGNOSIS — Z23 Encounter for immunization: Secondary | ICD-10-CM | POA: Diagnosis not present

## 2017-12-12 DIAGNOSIS — K219 Gastro-esophageal reflux disease without esophagitis: Secondary | ICD-10-CM | POA: Diagnosis present

## 2017-12-12 DIAGNOSIS — E1129 Type 2 diabetes mellitus with other diabetic kidney complication: Secondary | ICD-10-CM | POA: Diagnosis present

## 2017-12-12 DIAGNOSIS — I2510 Atherosclerotic heart disease of native coronary artery without angina pectoris: Secondary | ICD-10-CM | POA: Diagnosis present

## 2017-12-12 DIAGNOSIS — M19042 Primary osteoarthritis, left hand: Secondary | ICD-10-CM | POA: Diagnosis present

## 2017-12-12 DIAGNOSIS — I5030 Unspecified diastolic (congestive) heart failure: Secondary | ICD-10-CM | POA: Diagnosis not present

## 2017-12-12 DIAGNOSIS — Z9981 Dependence on supplemental oxygen: Secondary | ICD-10-CM | POA: Diagnosis not present

## 2017-12-12 DIAGNOSIS — Z95818 Presence of other cardiac implants and grafts: Secondary | ICD-10-CM

## 2017-12-12 DIAGNOSIS — I252 Old myocardial infarction: Secondary | ICD-10-CM | POA: Diagnosis not present

## 2017-12-12 DIAGNOSIS — M19041 Primary osteoarthritis, right hand: Secondary | ICD-10-CM | POA: Diagnosis present

## 2017-12-12 DIAGNOSIS — I4891 Unspecified atrial fibrillation: Secondary | ICD-10-CM | POA: Diagnosis present

## 2017-12-12 DIAGNOSIS — J3489 Other specified disorders of nose and nasal sinuses: Secondary | ICD-10-CM | POA: Diagnosis present

## 2017-12-12 LAB — URINALYSIS, ROUTINE W REFLEX MICROSCOPIC
Bilirubin Urine: NEGATIVE
GLUCOSE, UA: 50 mg/dL — AB
HGB URINE DIPSTICK: NEGATIVE
KETONES UR: NEGATIVE mg/dL
Leukocytes, UA: NEGATIVE
Nitrite: NEGATIVE
PROTEIN: NEGATIVE mg/dL
Specific Gravity, Urine: 1.012 (ref 1.005–1.030)
pH: 7 (ref 5.0–8.0)

## 2017-12-12 LAB — CBC
HCT: 42.9 % (ref 39.0–52.0)
Hemoglobin: 13.4 g/dL (ref 13.0–17.0)
MCH: 29.9 pg (ref 26.0–34.0)
MCHC: 31.2 g/dL (ref 30.0–36.0)
MCV: 95.8 fL (ref 78.0–100.0)
PLATELETS: 180 10*3/uL (ref 150–400)
RBC: 4.48 MIL/uL (ref 4.22–5.81)
RDW: 13.5 % (ref 11.5–15.5)
WBC: 8.2 10*3/uL (ref 4.0–10.5)

## 2017-12-12 LAB — I-STAT TROPONIN, ED: TROPONIN I, POC: 0.01 ng/mL (ref 0.00–0.08)

## 2017-12-12 LAB — BASIC METABOLIC PANEL
Anion gap: 8 (ref 5–15)
BUN: 21 mg/dL — ABNORMAL HIGH (ref 6–20)
CALCIUM: 8.7 mg/dL — AB (ref 8.9–10.3)
CHLORIDE: 96 mmol/L — AB (ref 101–111)
CO2: 34 mmol/L — ABNORMAL HIGH (ref 22–32)
CREATININE: 1.79 mg/dL — AB (ref 0.61–1.24)
GFR calc non Af Amer: 33 mL/min — ABNORMAL LOW (ref >60)
GFR, EST AFRICAN AMERICAN: 38 mL/min — AB (ref >60)
Glucose, Bld: 205 mg/dL — ABNORMAL HIGH (ref 65–99)
Potassium: 3.9 mmol/L (ref 3.5–5.1)
SODIUM: 138 mmol/L (ref 135–145)

## 2017-12-12 MED ORDER — FLUTICASONE PROPIONATE 50 MCG/ACT NA SUSP
2.0000 | Freq: Every day | NASAL | Status: DC
  Administered 2017-12-13 – 2017-12-14 (×2): 2 via NASAL
  Filled 2017-12-12: qty 16

## 2017-12-12 MED ORDER — MOMETASONE FURO-FORMOTEROL FUM 200-5 MCG/ACT IN AERO
2.0000 | INHALATION_SPRAY | Freq: Two times a day (BID) | RESPIRATORY_TRACT | Status: DC
  Administered 2017-12-13 – 2017-12-15 (×3): 2 via RESPIRATORY_TRACT
  Filled 2017-12-12: qty 8.8

## 2017-12-12 MED ORDER — INSULIN ASPART 100 UNIT/ML ~~LOC~~ SOLN: 0.0000 [IU] | Freq: Every day | SUBCUTANEOUS | Status: DC

## 2017-12-12 MED ORDER — ATORVASTATIN CALCIUM 80 MG PO TABS
80.0000 mg | ORAL_TABLET | Freq: Every day | ORAL | Status: DC
  Administered 2017-12-13 – 2017-12-14 (×2): 80 mg via ORAL
  Filled 2017-12-12 (×3): qty 1

## 2017-12-12 MED ORDER — PANTOPRAZOLE SODIUM 40 MG PO TBEC
40.0000 mg | DELAYED_RELEASE_TABLET | Freq: Every day | ORAL | Status: DC
  Administered 2017-12-13 – 2017-12-15 (×3): 40 mg via ORAL
  Filled 2017-12-12 (×3): qty 1

## 2017-12-12 MED ORDER — MONTELUKAST SODIUM 10 MG PO TABS
10.0000 mg | ORAL_TABLET | Freq: Every day | ORAL | Status: DC
  Administered 2017-12-13 – 2017-12-14 (×3): 10 mg via ORAL
  Filled 2017-12-12 (×4): qty 1

## 2017-12-12 MED ORDER — CLOPIDOGREL BISULFATE 75 MG PO TABS
75.0000 mg | ORAL_TABLET | Freq: Every day | ORAL | Status: DC
  Administered 2017-12-13 – 2017-12-14 (×2): 75 mg via ORAL
  Filled 2017-12-12 (×2): qty 1

## 2017-12-12 MED ORDER — ISOSORBIDE MONONITRATE ER 30 MG PO TB24
30.0000 mg | ORAL_TABLET | Freq: Every day | ORAL | Status: DC
  Administered 2017-12-13 – 2017-12-15 (×3): 30 mg via ORAL
  Filled 2017-12-12 (×3): qty 1

## 2017-12-12 MED ORDER — ONDANSETRON HCL 4 MG/2ML IJ SOLN: 4.0000 mg | Freq: Four times a day (QID) | INTRAMUSCULAR | Status: DC | PRN

## 2017-12-12 MED ORDER — ADULT MULTIVITAMIN W/MINERALS CH
1.0000 | ORAL_TABLET | Freq: Every day | ORAL | Status: DC
  Administered 2017-12-13 – 2017-12-15 (×3): 1 via ORAL
  Filled 2017-12-12 (×3): qty 1

## 2017-12-12 MED ORDER — TIOTROPIUM BROMIDE MONOHYDRATE 18 MCG IN CAPS
18.0000 ug | ORAL_CAPSULE | Freq: Every day | RESPIRATORY_TRACT | Status: DC
  Administered 2017-12-13 – 2017-12-15 (×2): 18 ug via RESPIRATORY_TRACT
  Filled 2017-12-12: qty 5

## 2017-12-12 MED ORDER — ATROPINE SULFATE 1 MG/10ML IJ SOSY: 0.5000 mg | PREFILLED_SYRINGE | INTRAMUSCULAR | Status: DC | PRN

## 2017-12-12 MED ORDER — ACETAMINOPHEN 325 MG PO TABS: 650.0000 mg | ORAL_TABLET | Freq: Four times a day (QID) | ORAL | Status: DC | PRN

## 2017-12-12 MED ORDER — LIDOCAINE-EPINEPHRINE 2 %-1:200000 IJ SOLN
10.0000 mL | Freq: Once | INTRAMUSCULAR | Status: AC
Start: 2017-12-12 — End: 2017-12-12
  Administered 2017-12-12: 10 mL via INTRADERMAL
  Filled 2017-12-12: qty 20

## 2017-12-12 MED ORDER — ASPIRIN EC 81 MG PO TBEC
81.0000 mg | DELAYED_RELEASE_TABLET | Freq: Every day | ORAL | Status: DC
  Administered 2017-12-13 – 2017-12-15 (×3): 81 mg via ORAL
  Filled 2017-12-12 (×4): qty 1

## 2017-12-12 MED ORDER — ALBUTEROL SULFATE (2.5 MG/3ML) 0.083% IN NEBU: 2.5000 mg | INHALATION_SOLUTION | RESPIRATORY_TRACT | Status: DC | PRN

## 2017-12-12 MED ORDER — POLYETHYLENE GLYCOL 3350 17 G PO PACK: 17.0000 g | PACK | Freq: Every day | ORAL | Status: DC | PRN

## 2017-12-12 MED ORDER — ONDANSETRON HCL 4 MG PO TABS
4.0000 mg | ORAL_TABLET | Freq: Four times a day (QID) | ORAL | Status: DC | PRN
Start: 2017-12-12 — End: 2017-12-15

## 2017-12-12 MED ORDER — ZOLPIDEM TARTRATE 5 MG PO TABS
5.0000 mg | ORAL_TABLET | Freq: Every evening | ORAL | Status: DC | PRN
  Administered 2017-12-13 – 2017-12-14 (×2): 5 mg via ORAL
  Filled 2017-12-12 (×2): qty 1

## 2017-12-12 MED ORDER — TETANUS-DIPHTH-ACELL PERTUSSIS 5-2.5-18.5 LF-MCG/0.5 IM SUSP
0.5000 mL | Freq: Once | INTRAMUSCULAR | Status: AC
  Administered 2017-12-12: 0.5 mL via INTRAMUSCULAR
  Filled 2017-12-12: qty 0.5

## 2017-12-12 MED ORDER — ISOSORBIDE MONONITRATE ER 30 MG PO TB24: 30.0000 mg | ORAL_TABLET | Freq: Once | ORAL | Status: DC

## 2017-12-12 MED ORDER — NITROGLYCERIN 0.4 MG SL SUBL: 0.4000 mg | SUBLINGUAL_TABLET | SUBLINGUAL | Status: DC | PRN

## 2017-12-12 MED ORDER — ACETAMINOPHEN 650 MG RE SUPP: 650.0000 mg | Freq: Four times a day (QID) | RECTAL | Status: DC | PRN

## 2017-12-12 MED ORDER — SODIUM CHLORIDE 0.9% FLUSH
3.0000 mL | Freq: Two times a day (BID) | INTRAVENOUS | Status: DC
  Administered 2017-12-13 – 2017-12-14 (×4): 3 mL via INTRAVENOUS

## 2017-12-12 MED ORDER — INSULIN ASPART 100 UNIT/ML ~~LOC~~ SOLN
0.0000 [IU] | Freq: Three times a day (TID) | SUBCUTANEOUS | Status: DC
  Administered 2017-12-15: 2 [IU] via SUBCUTANEOUS

## 2017-12-12 MED ORDER — FUROSEMIDE 20 MG PO TABS
60.0000 mg | ORAL_TABLET | Freq: Two times a day (BID) | ORAL | Status: DC
  Administered 2017-12-13 – 2017-12-15 (×5): 60 mg via ORAL
  Filled 2017-12-12 (×2): qty 1
  Filled 2017-12-12 (×2): qty 3
  Filled 2017-12-12: qty 1

## 2017-12-12 MED ORDER — HYDRALAZINE HCL 20 MG/ML IJ SOLN: 5.0000 mg | INTRAMUSCULAR | Status: DC | PRN

## 2017-12-12 NOTE — ED Triage Notes (Signed)
Patient arrived from home via EMS. States that he went outside and "blacked out" Reported loss of consciousness for 45 seconds. Hypertensive per EMS arrival. Fall post syncope with posterior laceration. Patient alert and oriented. Bandage around head to control bleeding.

## 2017-12-12 NOTE — ED Provider Notes (Signed)
Patient with syncope at home.  Intermittent episodes of bradycardia into the 30s.  Not hypotensive.  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  However, no signs of airway obstruction.  Agree with admission for telemetry and cardiology evaluation for possible sick sinus syndrome.   Tanna Furry, MD 12/12/17 2321

## 2017-12-12 NOTE — ED Provider Notes (Signed)
[facility] EMERGENCY DEPARTMENT Provider Note   CSN: [id number] Arrival date & time:        History   Chief Complaint Chief Complaint  Patient presents with  . Loss of Consciousness    HPI [name] is a 84 y.o. male.  HPI  Patient is a an 84 year old male with a past medical history of CHF, COPD, hyperlipidemia, CAD who comes in today following a syncopal event at home.  Patient just finished eating dinner when he stood up and walked outside after which he "blacked out."  Patient awoke after approximately 45 seconds and was alert and oriented at that time.  Per EMS, patient was hypertensive in route.  On arrival patient extremely hypertensive with a hemostatic wound on the posterior occiput.  Patient denies any weakness numbness nausea vomiting changes in vision or hearing.  Patient is currently on Plavix and aspirin.  Patient denies chest pain but endorses shortness of breath on exertion which is been going on for some time.  She has a previous history of a benign sinus mass status post resection.  Past Medical History:  Diagnosis Date  . Acute blood loss anemia    a. 06/2013 due to GIB.  Marland Kitchen Arthritis    "knees; hands" (07/11/2013)  . Asthma   . CAD (coronary artery disease)    a. s/p CABG x 3 in 1995; b.  NSTEMI 12/2011 -> Cath 6/21: LM 20-30, LAD 90, RI 95, LCX 21m, RCA 90/180m (treated w/ 2.0x16 Promus DES), VG->RCA ok, VG->RI 70-31m (treated w/ 4.0x28 Promus), LIMA->LAD ok, EF 55-60%.  . Chronic diastolic CHF (congestive heart failure) ([place])    a. Echo 10/2011: EF 55-60%;  b. elevated EDP req diuresis.  . Chronic respiratory failure ([place]) 07/11/2013   a. 3L home O2 24/7.  Marland Kitchen CKD (chronic kidney disease)    a. ?Based on Cr 2013 - 1.3-1.7.  . COPD (chronic obstructive pulmonary disease) ([place])    a. on home O2  . GERD (gastroesophageal reflux disease)   . GI bleed    a. 06/2013 adm - prepyloric ulcer, path neg for malignancy. A/w ABL anemia.  Marland Kitchen HLD  (hyperlipidemia)   . HOH (hard of hearing)    left ear  . HTN (hypertension)   . Hypothyroidism   . Morbid obesity ([place])   . Orthopnea    a. Has been sleeping in a recliner x 30 yrs.  . Sleep apnea   . Syncope     Patient Active Problem List   Diagnosis Date Noted  . 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 GERD (gastroesophageal reflux disease) 12/12/2017  . Fall 12/12/2017  . COPD with acute exacerbation ([place]) 09/12/2017  . Chronic combined systolic and diastolic CHF (congestive heart failure) ([place]) 01/29/2016  . History of recent fall 10/17/2015  . Risk for falls 10/17/2015  . Recurrent falls 10/17/2015  . Chest pain at rest   . DNR (do not resuscitate) 10/23/2014  . Thrush, oral 10/10/2014  . Sinusitis, chronic 10/07/2014  . Bronchitis, acute 10/06/2014  . Acute kidney injury ([place])   . Atrial fibrillation ([place])   . Acute on chronic diastolic heart failure ([place]) 10/04/2014  . Chest pain 10/04/2014  . New onset atrial fibrillation ([place]) 10/04/2014  . COPD exacerbation ([place]) 10/04/2014  . COLD (chronic obstructive lung disease) ([place])   . 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 GI bleed   . Acute on chronic diastolic CHF (congestive heart failure) ([place]) 08/07/2013  . COPD (chronic  obstructive pulmonary disease) ([place]) 08/07/2013  . CKD (chronic kidney disease), stage III ([place]) 08/07/2013  . Acute blood loss anemia   . Elevated serum creatinine 07/11/2013  . Chronic respiratory failure ([place]) 07/11/2013  . Chronic diastolic CHF (congestive heart failure) ([place])   . CAD (coronary artery disease)   . HTN (hypertension)   . HLD (hyperlipidemia)   . History of acute myocardial infarction 01/06/2012  . Type II diabetes mellitus with renal manifestations ([place]) 01/06/2012  . Obstructive chronic bronchitis without exacerbation ([place]) 10/16/2010  . Syncope 08/06/2010  . DIZZINESS 08/06/2010  . CAD, NATIVE VESSEL 01/27/2010  . CAD, ARTERY BYPASS GRAFT 01/27/2010  . OBESITY 01/24/2009      Past Surgical History:  Procedure Laterality Date  . [place]   "when I was in the [place]" (07/11/2013)  . CORONARY ANGIOPLASTY WITH STENT PLACEMENT  12/2011  . CORONARY ARTERY BYPASS GRAFT  1995   CABG X3  . ESOPHAGOGASTRODUODENOSCOPY N/A 07/12/2013   Procedure: ESOPHAGOGASTRODUODENOSCOPY (EGD);  Surgeon: Inda Castle, MD;  Location: [facility];  Service: Endoscopy;  Laterality: N/A;  . EXCISIONAL HEMORRHOIDECTOMY     "twice cut them out; burnt them out once"  . FUNCTIONAL ENDOSCOPIC SINUS SURGERY  2006  . HYDROCELE EXCISION  2005  . INGUINAL HERNIA REPAIR Right 2006  . KNEE ARTHROSCOPY Right 1990's  . LEFT HEART CATHETERIZATION WITH CORONARY/GRAFT ANGIOGRAM  01/07/2012   Procedure: LEFT HEART CATHETERIZATION WITH Beatrix Fetters;  Surgeon: Peter M Martinique, MD;  Location[facility] C[facility] LAB;  Service: Cardiovascular;;  . PERCUTANEOUS CORONARY STENT INTERVENTION (PCI-S) N/A 01/10/2012   Procedure: PERCUTANEOUS CORONARY STENT INTERVENTION (PCI-S);  Surgeon: Peter M Martinique, MD;  Location[facility] C[facility] LAB;  Service: Cardiovascular;  Laterality: N/A;  . [place] Medications    Prior to Admission medications   Medication Sig Start Date End Date Taking? Authorizing Provider  albuterol (PROVENTIL HFA;VENTOLIN HFA) 108 (90 BASE) MCG/ACT inhaler Inhale 2 puffs into the lungs every 6 (six) hours as needed for wheezing or shortness of breath.   Yes [provider]  albuterol (PROVENTIL) (2.5 MG/3ML) 0.083% nebulizer solution Take 3 mLs (2.5 mg total) by nebulization every 6 (six) hours as needed for wheezing. 07/05/13  Yes Bonnielee Haff, MD  ARTIFICIAL TEAR OP Place 2 drops into both eyes daily as needed. For dry eyes   Yes [provider]  aspirin 81 MG tablet Take 1 tablet (81 mg total) by mouth daily. 08/10/13  Yes Dunn, Dayna N, PA-C  atorvastatin (LIPITOR) 80 MG tablet Take 1 tablet (80 mg total) by mouth daily at 6 PM. 11/01/17   Yes Nahser, Wonda Cheng, MD  budesonide-formoterol [facility]) 160-4.5 MCG/ACT inhaler Inhale 2 puffs into the lungs 2 (two) times daily.   Yes [provider]  BYSTOLIC 10 MG tablet TAKE ONE TABLET BY MOUTH ONCE DAILY 05/01/14  Yes Nahser, Wonda Cheng, MD  clopidogrel (PLAVIX) 75 MG tablet Take 1 tablet (75 mg total) by mouth daily with breakfast. 08/04/17  Yes Nahser, Wonda Cheng, MD  fluticasone (FLONASE) 50 MCG/ACT nasal spray Place 2 sprays into both nostrils daily. 10/13/14  Yes Short, Noah Delaine, MD  furosemide (LASIX) 40 MG tablet Take 1.5 tablets (60 mg total) by mouth 2 (two) times daily. 06/19/15  Yes Nahser, Wonda Cheng, MD  glimepiride (AMARYL) 2 MG tablet Take 2 mg by mouth daily with breakfast.   Yes Lawerance Cruel, MD  isosorbide mononitrate (IMDUR)  30 MG 24 hr tablet Take 1 tablet (30 mg total) by mouth daily. 08/04/17  Yes Nahser, Wonda Cheng, MD  montelukast (SINGULAIR) 10 MG tablet Take 10 mg by mouth at bedtime.   Yes [provider]  Multiple Vitamins-Minerals (MULTIVITAMIN & MINERAL PO) Take 1 tablet by mouth daily.     Yes [provider]  nitroGLYCERIN (NITROSTAT) 0.4 MG SL tablet Place 1 tablet (0.4 mg total) under the tongue every 5 (five) minutes as needed for chest pain. 11/02/17  Yes Nahser, Wonda Cheng, MD  pantoprazole (PROTONIX) 40 MG tablet Take 1 tablet (40 mg total) by mouth daily. Patient overdue for an appointment. Needs to call and schedule for further refills 2nd attempt 11/02/17  Yes Nahser, Wonda Cheng, MD  potassium chloride SA (K-DUR,KLOR-CON) 20 MEQ tablet Take 1 tablet (20 mEq total) by mouth 2 (two) times daily. Please make overdue yearly appt with Dr.Nahser before anymore refills 2nd attempt 10/24/17  Yes Nahser, Wonda Cheng, MD  tiotropium (SPIRIVA) 18 MCG inhalation capsule Place 1 capsule (18 mcg total) into inhaler and inhale daily. 07/05/13  Yes Bonnielee Haff, MD  predniSONE (DELTASONE) 10 MG tablet Take 1 tablet (10 mg total) by mouth daily with  breakfast. Take 6 tablets today and then decrease by 1 tablet daily until none are left. Patient not taking: Reported on 12/12/2017 09/13/17   Isaac Bliss, Rayford Halsted, MD    Family History Family History  Problem Relation Age of Onset  . CAD Father 62    Social History Social History   Tobacco Use  . Smoking status: Never Smoker  . Smokeless tobacco: Never Used  Substance Use Topics  . Alcohol use: Yes    Comment: prior alcohol, no sig use now  . Drug use: No     Allergies   Other   Review of Systems Review of Systems  Constitutional: Negative for chills, fatigue and fever.  Respiratory: Positive for shortness of breath.   Cardiovascular: Negative for chest pain and leg swelling.  Gastrointestinal: Negative for abdominal pain, nausea and vomiting.  Neurological: Positive for syncope, light-headedness and headaches. Negative for facial asymmetry, weakness and numbness.  All other systems reviewed and are negative.    Physical Exam Updated Vital Signs BP (!) 136/58   Pulse (!) 41   Temp 98 F (36.7 C) (Oral)   Resp 20   Ht 5\' 11"  (1.803 m)   Wt 117.9 kg (260 lb)   SpO2 98%   BMI 36.26 kg/m   Physical Exam  Constitutional: He is oriented to person, place, and time. He appears well-developed and well-nourished.  HENT:  Head: Normocephalic.  5 cm laceration to the occiput. hemostatic  Eyes: Conjunctivae are normal.  Neck: Neck supple.  Cardiovascular: Normal rate and regular rhythm.  No murmur heard. Pulmonary/Chest: Effort normal and breath sounds normal. No respiratory distress.  Abdominal: Soft. There is no tenderness.  Musculoskeletal: Normal range of motion. He exhibits no edema or tenderness.  Neurological: He is alert and oriented to person, place, and time. No cranial nerve deficit or sensory deficit. He exhibits normal muscle tone. Coordination normal.  Skin: Skin is warm and dry.  Psychiatric: He has a normal mood and affect.  Nursing note and  vitals reviewed.    ED Treatments / Results  Labs (all labs ordered are listed, but only abnormal results are displayed) Labs Reviewed  BASIC METABOLIC PANEL - Abnormal; Notable for the following components:      Result Value  Chloride 96 (*)    CO2 34 (*)    Glucose, Bld 205 (*)    BUN 21 (*)    Creatinine, Ser 1.79 (*)    Calcium 8.7 (*)    GFR calc non Af Amer 33 (*)    GFR calc Af Amer 38 (*)    All other components within normal limits  URINALYSIS, ROUTINE W REFLEX MICROSCOPIC - Abnormal; Notable for the following components:   Glucose, UA 50 (*)    All other components within normal limits  CBC  HEMOGLOBIN A1C  LIPID PANEL  TROPONIN I  TROPONIN I  TROPONIN I  TSH  I-STAT TROPONIN, ED  CBG MONITORING, ED    EKG None  Radiology Dg Chest 2 View  Result Date: 12/12/2017 CLINICAL DATA:  Syncope. EXAM: CHEST - 2 VIEW COMPARISON:  09/12/2017. FINDINGS: The heart size appears within normal limits. Previous median sternotomy CABG procedure. Decreased lung volumes. There is no pleural effusion or edema. No airspace opacities identified. IMPRESSION: No active cardiopulmonary disease. Electronically Signed   By: Kerby Moors M.D.   On: 12/12/2017 20:49   Ct Head Wo Contrast  Result Date: 12/12/2017 CLINICAL DATA:  Acute onset of syncope and fall. Hit head. Laceration at the back of the head. Concern for cervical spine injury. EXAM: CT HEAD WITHOUT CONTRAST CT CERVICAL SPINE WITHOUT CONTRAST TECHNIQUE: Multidetector CT imaging of the head and cervical spine was performed following the standard protocol without intravenous contrast. Multiplanar CT image reconstructions of the cervical spine were also generated. COMPARISON:  Paranasal sinus CT performed 10/02/2014 FINDINGS: CT HEAD FINDINGS Brain: No evidence of acute infarction, hemorrhage, hydrocephalus, extra-axial collection or mass lesion / mass effect. Prominence of the ventricles and sulci reflects mild cortical volume  loss. Scattered periventricular and subcortical white matter change likely reflects small vessel ischemic microangiopathy. The brainstem and fourth ventricle are within normal limits. The basal ganglia are unremarkable in appearance. The cerebral hemispheres demonstrate grossly normal gray-white differentiation. No mass effect or midline shift is seen. Vascular: No hyperdense vessel or unexpected calcification. Skull: There is no evidence of fracture; visualized osseous structures are unremarkable in appearance. Sinuses/Orbits: The visualized portions of the orbits are within normal limits. A polypoid mass is again noted filling the surgically opened right maxillary antrum and right-sided nasal passages, increased in size from the prior study and extending inferiorly into the oropharynx. There is partial opacification of the right frontal sinus. The remaining paranasal sinuses and mastoid air cells are well-aerated. Other: Soft tissue swelling and laceration are noted at the occiput. CT CERVICAL SPINE FINDINGS Alignment: There is grade 1 anterolisthesis of C4 on C5, reflecting underlying facet disease. Mild degenerative change is noted about the dens. Skull base and vertebrae: No acute fracture. There is mild chronic loss of height at vertebral body C5. No primary bone lesion or focal pathologic process. Soft tissues and spinal canal: No prevertebral fluid or swelling. No visible canal hematoma. Disc levels: Multilevel disc space narrowing is noted along the cervical spine, with underlying anterior and posterior disc osteophyte complexes. Upper chest: The visualized portions of the thyroid gland are unremarkable. The visualized lung apices are clear. Other: No additional soft tissue abnormalities are seen. IMPRESSION: 1. No evidence of traumatic intracranial injury or fracture. 2. No evidence of fracture or subluxation along the cervical spine. 3. Soft tissue swelling and laceration at the occiput. 4. Enlarging  polypoid mass filling the surgically open right maxillary antrum and right-sided nasal passages. This now extends  inferiorly into the oropharynx, and is compatible with recurrence of the patient's previously noted right maxillary mass. Given extension into the oropharynx, surgical consultation is recommended. Corresponding partial opacification of the right frontal sinus. 5. Mild cortical volume loss and scattered small vessel ischemic microangiopathy. 6. Mild degenerative change noted along the cervical spine. Electronically Signed   By: Garald Balding M.D.   On: 12/12/2017 21:18   Ct Cervical Spine Wo Contrast  Result Date: 12/12/2017 CLINICAL DATA:  Acute onset of syncope and fall. Hit head. Laceration at the back of the head. Concern for cervical spine injury. EXAM: CT HEAD WITHOUT CONTRAST CT CERVICAL SPINE WITHOUT CONTRAST TECHNIQUE: Multidetector CT imaging of the head and cervical spine was performed following the standard protocol without intravenous contrast. Multiplanar CT image reconstructions of the cervical spine were also generated. COMPARISON:  Paranasal sinus CT performed 10/02/2014 FINDINGS: CT HEAD FINDINGS Brain: No evidence of acute infarction, hemorrhage, hydrocephalus, extra-axial collection or mass lesion / mass effect. Prominence of the ventricles and sulci reflects mild cortical volume loss. Scattered periventricular and subcortical white matter change likely reflects small vessel ischemic microangiopathy. The brainstem and fourth ventricle are within normal limits. The basal ganglia are unremarkable in appearance. The cerebral hemispheres demonstrate grossly normal gray-white differentiation. No mass effect or midline shift is seen. Vascular: No hyperdense vessel or unexpected calcification. Skull: There is no evidence of fracture; visualized osseous structures are unremarkable in appearance. Sinuses/Orbits: The visualized portions of the orbits are within normal limits. A polypoid  mass is again noted filling the surgically opened right maxillary antrum and right-sided nasal passages, increased in size from the prior study and extending inferiorly into the oropharynx. There is partial opacification of the right frontal sinus. The remaining paranasal sinuses and mastoid air cells are well-aerated. Other: Soft tissue swelling and laceration are noted at the occiput. CT CERVICAL SPINE FINDINGS Alignment: There is grade 1 anterolisthesis of C4 on C5, reflecting underlying facet disease. Mild degenerative change is noted about the dens. Skull base and vertebrae: No acute fracture. There is mild chronic loss of height at vertebral body C5. No primary bone lesion or focal pathologic process. Soft tissues and spinal canal: No prevertebral fluid or swelling. No visible canal hematoma. Disc levels: Multilevel disc space narrowing is noted along the cervical spine, with underlying anterior and posterior disc osteophyte complexes. Upper chest: The visualized portions of the thyroid gland are unremarkable. The visualized lung apices are clear. Other: No additional soft tissue abnormalities are seen. IMPRESSION: 1. No evidence of traumatic intracranial injury or fracture. 2. No evidence of fracture or subluxation along the cervical spine. 3. Soft tissue swelling and laceration at the occiput. 4. 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 Given extension into the oropharynx, surgical consultation is recommended. Corresponding partial opacification of the right frontal sinus. 5. Mild cortical volume loss and scattered small vessel ischemic microangiopathy. 6. Mild degenerative change noted along the cervical spine. Electronically Signed   By: Garald Balding M.D.   On: 12/12/2017 21:18     Procedures .Marland KitchenLaceration Repair Date/Time: 12/12/2017 11:01 PM Performed by: Chapman Moss, MD Authorized by: Tanna Furry, MD   Consent:    Consent obtained:  Verbal   Consent given by:  Patient   Risks discussed:  Infection, pain, retained foreign body, poor cosmetic result, poor wound healing and  nerve damage   Alternatives discussed:  No treatment Anesthesia (see MAR for exact dosages):    Anesthesia method:  Local infiltration   Local anesthetic:  Lidocaine 2% WITH epi Laceration details:    Location:  Scalp   Scalp location:  Occipital   Length (cm):  5 Repair type:    Repair type:  Simple Pre-procedure details:    Preparation:  Patient was prepped and draped in usual sterile fashion Exploration:    Wound exploration: entire depth of wound probed and visualized     Wound extent: no foreign bodies/material noted     Contaminated: no   Treatment:    Area cleansed with:  Saline   Amount of cleaning:  Extensive   Irrigation solution:  Sterile water   Irrigation method:  Syringe Skin repair:    Repair method:  Sutures   Suture size: 4-0.   Wound skin closure material used: vicryl.   Suture technique:  Simple interrupted   Number of sutures:  6 Post-procedure details:    Dressing:  Open (no dressing)   Patient tolerance of procedure:  Tolerated well, no immediate complications   (including critical care time)  Medications Ordered in ED Medications  isosorbide mononitrate (IMDUR) 24 hr tablet 30 mg (has no administration in time range)  albuterol (PROVENTIL) (2.5 MG/3ML) 0.083% nebulizer solution 2.5 mg (has no administration in time range)  aspirin EC tablet 81 mg (has no administration in time range)  atorvastatin (LIPITOR) tablet 80 mg (has no administration in time range)  mometasone-formoterol (DULERA) 200-5 MCG/ACT inhaler 2 puff (has no administration in time range)  clopidogrel (PLAVIX) tablet 75 mg (has no administration in time range)  fluticasone  (FLONASE) 50 MCG/ACT nasal spray 2 spray (has no administration in time range)  furosemide (LASIX) tablet 60 mg (has no administration in time range)  isosorbide mononitrate (IMDUR) 24 hr tablet 30 mg (has no administration in time range)  montelukast (SINGULAIR) tablet 10 mg (has no administration in time range)  multivitamin with minerals tablet 1 tablet (has no administration in time range)  nitroGLYCERIN (NITROSTAT) SL tablet 0.4 mg (has no administration in time range)  pantoprazole (PROTONIX) EC tablet 40 mg (has no administration in time range)  tiotropium (SPIRIVA) inhalation capsule 18 mcg (has no administration in time range)  atropine 1 MG/10ML injection 0.5 mg (has no administration in time range)  insulin aspart (novoLOG) injection 0-9 Units (has no administration in time range)  insulin aspart (novoLOG) injection 0-5 Units (has no administration in time range)  sodium chloride flush (NS) 0.9 % injection 3 mL (has no administration in time range)  acetaminophen (TYLENOL) tablet 650 mg (has no administration in time range)    Or  acetaminophen (TYLENOL) suppository 650 mg (has no administration in time range)  polyethylene glycol (MIRALAX / GLYCOLAX) packet 17 g (has no administration in time range)  ondansetron (ZOFRAN) tablet 4 mg (has no administration in time range)    Or  ondansetron (ZOFRAN) injection 4 mg (has no administration in time range)  hydrALAZINE (APRESOLINE) injection 5 mg (has no administration in time range)  zolpidem (AMBIEN) tablet 5 mg (has no administration in time range)  lidocaine-EPINEPHrine (XYLOCAINE W/EPI) 2 %-1:200000 (PF) injection 10 mL (10 mLs Intradermal Given by Other 12/12/17 2306)  Tdap (BOOSTRIX) injection 0.5 mL (0.5 mLs Intramuscular Given 12/12/17 2304)     Initial Impression / Assessment and Plan / ED Course  I have reviewed the triage vital signs and the nursing notes.  Pertinent labs & imaging results that were available during my  care of the patient were reviewed by me and considered in my medical decision making (see chart for details).     Laboratory work-up largely within normal limits without concerning findings.  CT head and neck reveal recurrence of previous mass with extension to the airway.  Airway intact patient with clear bilateral breath sounds, no stridor on exam.  While in the emergency department, patient's heart rate would range from 30s to 120s.  EKG shows likely sick sinus syndrome.  Believe this likely response with the patient's syncopal event, will admit the patient for further evaluation and care.  Final Clinical Impressions(s) / ED Diagnoses   Final diagnoses:  None    ED Discharge Orders    None       Chapman Moss, MD 12/13/17 5110    Tanna Furry, MD 12/22/17 [phone]

## 2017-12-12 NOTE — ED Notes (Signed)
EDP made aware of patients change in vitals. New EKG obtained.

## 2017-12-12 NOTE — H&P (Signed)
History and Physical    [name]:[id number] DOB: 05-05-1931 DOA: 12/12/2017  Referring MD/NP/PA:   PCP: Lawerance Cruel, MD   Patient coming from:  The patient is coming from home.  At baseline, pt is independent for most of ADL.  Chief Complaint: syncope  HPI: [name] is a 84 y.o. male with medical history significant of dCHF, COPD, hyperlipidemia, diabetes mellitus, GERD, CAD, CABG, stent placement, CKD 3, GI bleeding, atrial fibrillation not on anticoagulants, obesity, benign sinus mass status post resection, who presents with syncope.  Per report, pt "blacked out" for about 45 seconds after eating dinner when he stood up and walked outside.  Patient denies any chest pain, shortness breath or palpitation.  no unilateral weakness, numbness or tingling his extremities, no facial droop, slurred speech.  Patient denies cough, fever, chills, nausea, vomiting, diarrhea, abdominal pain.  No symptoms of UTI.  Pt had elevated blood pressure with SBP 220s, which has improved to 97/67 in ED.  Patient was found to have tachy-bradycardia with heart rate 30-130s in ED. Pt had occipital scalp laceration.  ED Course: pt was found to have negative troponin, WBC 8.2, negative urinalysis, stable renal function, temperature normal, oxygen saturation 88% on room air, respiration rate 16.  Patient is admitted to stepdown as inpatient.  Cardiology, Dr.Lowenster was consulted.  CT--head and C spin showed: 1. No evidence of traumatic intracranial injury or fracture. 2. No evidence of fracture or subluxation along the cervical spine. 3. Soft tissue swelling and laceration at the occiput. 4. 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 Given extension into the oropharynx, surgical consultation is  recommended. Corresponding partial opacification of the right frontal sinus. 5. Mild cortical volume loss and scattered small vessel ischemic microangiopathy. 6. Mild degenerative change noted along the cervical spine.  Review of Systems:   General: no fevers, chills, no body weight gain, has poor appetite, has fatigue HEENT: no blurry vision, hearing changes or sore throat Respiratory: no dyspnea, coughing, wheezing CV: no chest pain, no palpitations GI: no nausea, vomiting, abdominal pain, diarrhea, constipation GU: no dysuria, burning on urination, increased urinary frequency, hematuria  Ext: had mild leg edema Neuro: no unilateral weakness, numbness, or tingling, no vision change or hearing loss. Has syncope. Skin: Has occipital scalp laceration. MSK: No muscle spasm, no deformity, no limitation of range of movement in spin Heme: No easy bruising.  Travel history: No recent long distant travel.  Allergy:  Allergies  Allergen Reactions  . Other Other (See Comments)    Cream cheese-makes very sick    Past Medical History:  Diagnosis Date  . Acute blood loss anemia    a. 06/2013 due to GIB.  Marland Kitchen Arthritis    "knees; hands" (07/11/2013)  . Asthma   . CAD (coronary artery disease)    a. s/p CABG x 3 in 1995; b.  NSTEMI 12/2011 -> Cath 6/21: LM 20-30, LAD 90, RI 95, LCX 45m, RCA 90/140m (treated w/ 2.0x16 Promus DES), VG->RCA ok, VG->RI 70-22m (treated w/ 4.0x28 Promus), LIMA->LAD ok, EF 55-60%.  . Chronic diastolic CHF (congestive heart failure) ([place])    a. Echo 10/2011: EF 55-60%;  b. elevated EDP req diuresis.  . Chronic respiratory failure ([place]) 07/11/2013   a. 3L home O2 24/7.  Marland Kitchen CKD (chronic kidney disease)    a. ?  Based on Cr 2013 - 1.3-1.7.  . COPD (chronic obstructive pulmonary disease) ([place])    a. on home O2  . GERD (gastroesophageal reflux disease)   . GI bleed    a. 06/2013 adm - prepyloric ulcer, path neg for malignancy. A/w ABL anemia.  Marland Kitchen HLD (hyperlipidemia)     . HOH (hard of hearing)    left ear  . HTN (hypertension)   . Hypothyroidism   . Morbid obesity ([place])   . Orthopnea    a. Has been sleeping in a recliner x 30 yrs.  . Sleep apnea   . Syncope     Past Surgical History:  Procedure Laterality Date  . [place]   "when I was in the [facility]" (07/11/2013)  . CORONARY ANGIOPLASTY WITH STENT PLACEMENT  12/2011  . CORONARY ARTERY BYPASS GRAFT  1995   CABG X3  . ESOPHAGOGASTRODUODENOSCOPY N/A 07/12/2013   Procedure: ESOPHAGOGASTRODUODENOSCOPY (EGD);  Surgeon: Inda Castle, MD;  Location: [facility];  Service: Endoscopy;  Laterality: N/A;  . EXCISIONAL HEMORRHOIDECTOMY     "twice cut them out; burnt them out once"  . FUNCTIONAL ENDOSCOPIC SINUS SURGERY  2006  . HYDROCELE EXCISION  2005  . INGUINAL HERNIA REPAIR Right 2006  . KNEE ARTHROSCOPY Right 1990's  . LEFT HEART CATHETERIZATION WITH CORONARY/GRAFT ANGIOGRAM  01/07/2012   Procedure: LEFT HEART CATHETERIZATION WITH Beatrix Fetters;  Surgeon: Peter M Martinique, MD;  Location: [facility] CATH LAB;  Service: Cardiovascular;;  . PERCUTANEOUS CORONARY STENT INTERVENTION (PCI-S) N/A 01/10/2012   Procedure: PERCUTANEOUS CORONARY STENT INTERVENTION (PCI-S);  Surgeon: Peter M Martinique, MD;  Location: [facility] CATH LAB;  Service: Cardiovascular;  Laterality: N/A;  . PROSTATE SURGERY  1998    Social History:  reports that he has never smoked. He has never used smokeless tobacco. He reports that he drinks alcohol. He reports that he does not use drugs.  Family History:  Family History  Problem Relation Age of Onset  . CAD Father 25     Prior to Admission medications   Medication Sig Start Date End Date Taking? Authorizing Provider  albuterol (PROVENTIL HFA;VENTOLIN HFA) 108 (90 BASE) MCG/ACT inhaler Inhale 2 puffs into the lungs every 6 (six) hours as needed for wheezing or shortness of breath.   Yes [provider]  albuterol (PROVENTIL) (2.5 MG/3ML) 0.083% nebulizer solution  Take 3 mLs (2.5 mg total) by nebulization every 6 (six) hours as needed for wheezing. 07/05/13  Yes Bonnielee Haff, MD  ARTIFICIAL TEAR OP Place 2 drops into both eyes daily as needed. For dry eyes   Yes [provider]  aspirin 81 MG tablet Take 1 tablet (81 mg total) by mouth daily. 08/10/13  Yes Dunn, Dayna N, PA-C  atorvastatin (LIPITOR) 80 MG tablet Take 1 tablet (80 mg total) by mouth daily at 6 PM. 11/01/17  Yes Nahser, Wonda Cheng, MD  budesonide-formoterol [facility]) 160-4.5 MCG/ACT inhaler Inhale 2 puffs into the lungs 2 (two) times daily.   Yes [provider]  BYSTOLIC 10 MG tablet TAKE ONE TABLET BY MOUTH ONCE DAILY 05/01/14  Yes Nahser, Wonda Cheng, MD  clopidogrel (PLAVIX) 75 MG tablet Take 1 tablet (75 mg total) by mouth daily with breakfast. 08/04/17  Yes Nahser, Wonda Cheng, MD  fluticasone (FLONASE) 50 MCG/ACT nasal spray Place 2 sprays into both nostrils daily. 10/13/14  Yes Short, Noah Delaine, MD  furosemide (LASIX) 40 MG tablet Take 1.5 tablets (60 mg total) by mouth 2 (two) times daily. 06/19/15  Yes Nahser, Wonda Cheng, MD  glimepiride (AMARYL) 2 MG tablet Take 2 mg by mouth daily with breakfast.   Yes Lawerance Cruel, MD  isosorbide mononitrate (IMDUR) 30 MG 24 hr tablet Take 1 tablet (30 mg total) by mouth daily. 08/04/17  Yes Nahser, Wonda Cheng, MD  montelukast (SINGULAIR) 10 MG tablet Take 10 mg by mouth at bedtime.   Yes [provider]  Multiple Vitamins-Minerals (MULTIVITAMIN & MINERAL PO) Take 1 tablet by mouth daily.     Yes [provider]  nitroGLYCERIN (NITROSTAT) 0.4 MG SL tablet Place 1 tablet (0.4 mg total) under the tongue every 5 (five) minutes as needed for chest pain. 11/02/17  Yes Nahser, Wonda Cheng, MD  pantoprazole (PROTONIX) 40 MG tablet Take 1 tablet (40 mg total) by mouth daily. Patient overdue for an appointment. Needs to call and schedule for further refills 2nd attempt 11/02/17  Yes Nahser, Wonda Cheng, MD  potassium chloride SA  (K-DUR,KLOR-CON) 20 MEQ tablet Take 1 tablet (20 mEq total) by mouth 2 (two) times daily. Please make overdue yearly appt with Dr.Nahser before anymore refills 2nd attempt 10/24/17  Yes Nahser, Wonda Cheng, MD  tiotropium (SPIRIVA) 18 MCG inhalation capsule Place 1 capsule (18 mcg total) into inhaler and inhale daily. 07/05/13  Yes Bonnielee Haff, MD  predniSONE (DELTASONE) 10 MG tablet Take 1 tablet (10 mg total) by mouth daily with breakfast. Take 6 tablets today and then decrease by 1 tablet daily until none are left. Patient not taking: Reported on 12/12/2017 09/13/17   Isaac Bliss, Rayford Halsted, MD    Physical Exam: Vitals:   12/12/17 2145 12/12/17 2215 12/12/17 2230 12/12/17 2315  BP: (!) 106/54 97/67 (!) 117/53 (!) 136/58  Pulse: (!) 52 (!) 43 (!) 38 (!) 41  Resp: 16 16 11 20   Temp:      TempSrc:      SpO2: 98% 99% 97% 98%  Weight:      Height:       General: Not in acute distress HEENT:       Eyes: PERRL, EOMI, no scleral icterus.       ENT: No discharge from the ears and nose, no pharynx injection, no tonsillar enlargement.        Neck: No JVD, no bruit, no mass felt. Heme: No neck lymph node enlargement. Cardiac: S1/S2, RRR, No murmurs, No gallops or rubs. Respiratory: No rales, wheezing, rhonchi or rubs. GI: Soft, nondistended, nontender, no rebound pain, no organomegaly, BS present. GU: No hematuria Ext: has trace leg edema bilaterally. 2+DP/PT pulse bilaterally. Musculoskeletal: No joint deformities, No joint redness or warmth, no limitation of ROM in spin. Skin: No rashes.  Has occipital scalp laceration Neuro: Alert, oriented X3, cranial nerves II-XII grossly intact, moves all extremities normally. Muscle strength 5/5 in all extremities, sensation to light touch intact. Brachial reflex 2+ bilaterally. . Negative Babinski's sign. Psych: Patient is not psychotic, no suicidal or hemocidal ideation.  Labs on Admission: I have personally reviewed following labs and imaging  studies  CBC: Recent Labs  Lab 12/12/17 2028  WBC 8.2  HGB 13.4  HCT 42.9  MCV 95.8  PLT 762   Basic Metabolic Panel: Recent Labs  Lab 12/12/17 2028  NA 138  K 3.9  CL 96*  CO2 34*  GLUCOSE 205*  BUN 21*  CREATININE 1.79*  CALCIUM 8.7*   GFR: Estimated Creatinine Clearance: 39.4 mL/min (A) (by C-G formula based on SCr of 1.79 mg/dL (H)). Liver Function Tests:  No results for input(s): AST, ALT, ALKPHOS, BILITOT, PROT, ALBUMIN in the last 168 hours. No results for input(s): LIPASE, AMYLASE in the last 168 hours. No results for input(s): AMMONIA in the last 168 hours. Coagulation Profile: No results for input(s): INR, PROTIME in the last 168 hours. Cardiac Enzymes: No results for input(s): CKTOTAL, CKMB, CKMBINDEX, TROPONINI in the last 168 hours. BNP (last 3 results) No results for input(s): PROBNP in the last 8760 hours. HbA1C: No results for input(s): HGBA1C in the last 72 hours. CBG: No results for input(s): GLUCAP in the last 168 hours. Lipid Profile: No results for input(s): CHOL, HDL, LDLCALC, TRIG, CHOLHDL, LDLDIRECT in the last 72 hours. Thyroid Function Tests: No results for input(s): TSH, T4TOTAL, FREET4, T3FREE, THYROIDAB in the last 72 hours. Anemia Panel: No results for input(s): VITAMINB12, FOLATE, FERRITIN, TIBC, IRON, RETICCTPCT in the last 72 hours. Urine analysis:    Component Value Date/Time   COLORURINE YELLOW 12/12/2017 2031   APPEARANCEUR CLEAR 12/12/2017 2031   LABSPEC 1.012 12/12/2017 2031   PHURINE 7.0 12/12/2017 2031   GLUCOSEU 50 (A) 12/12/2017 2031   HGBUR NEGATIVE 12/12/2017 2031   BILIRUBINUR NEGATIVE 12/12/2017 2031   KETONESUR NEGATIVE 12/12/2017 2031   PROTEINUR NEGATIVE 12/12/2017 2031   UROBILINOGEN 0.2 10/05/2014 1300   NITRITE NEGATIVE 12/12/2017 2031   LEUKOCYTESUR NEGATIVE 12/12/2017 2031   Sepsis Labs: @LABRCNTIP (procalcitonin:4,lacticidven:4) )No results found for this or any previous visit (from the past 240  hour(s)).   Radiological Exams on Admission: Dg Chest 2 View  Result Date: 12/12/2017 CLINICAL DATA:  Syncope. EXAM: CHEST - 2 VIEW COMPARISON:  09/12/2017. FINDINGS: The heart size appears within normal limits. Previous median sternotomy CABG procedure. Decreased lung volumes. There is no pleural effusion or edema. No airspace opacities identified. IMPRESSION: No active cardiopulmonary disease. Electronically Signed   By: Kerby Moors M.D.   On: 12/12/2017 20:49   Ct Head Wo Contrast  Result Date: 12/12/2017 CLINICAL DATA:  Acute onset of syncope and fall. Hit head. Laceration at the back of the head. Concern for cervical spine injury. EXAM: CT HEAD WITHOUT CONTRAST CT CERVICAL SPINE WITHOUT CONTRAST TECHNIQUE: Multidetector CT imaging of the head and cervical spine was performed following the standard protocol without intravenous contrast. Multiplanar CT image reconstructions of the cervical spine were also generated. COMPARISON:  Paranasal sinus CT performed 10/02/2014 FINDINGS: CT HEAD FINDINGS Brain: No evidence of acute infarction, hemorrhage, hydrocephalus, extra-axial collection or mass lesion / mass effect. Prominence of the ventricles and sulci reflects mild cortical volume loss. Scattered periventricular and subcortical white matter change likely reflects small vessel ischemic microangiopathy. The brainstem and fourth ventricle are within normal limits. The basal ganglia are unremarkable in appearance. The cerebral hemispheres demonstrate grossly normal gray-white differentiation. No mass effect or midline shift is seen. Vascular: No hyperdense vessel or unexpected calcification. Skull: There is no evidence of fracture; visualized osseous structures are unremarkable in appearance. Sinuses/Orbits: The visualized portions of the orbits are within normal limits. A polypoid mass is again noted filling the surgically opened right maxillary antrum and right-sided nasal passages, increased in size  from the prior study and extending inferiorly into the oropharynx. There is partial opacification of the right frontal sinus. The remaining paranasal sinuses and mastoid air cells are well-aerated. Other: Soft tissue swelling and laceration are noted at the occiput. CT CERVICAL SPINE FINDINGS Alignment: There is grade 1 anterolisthesis of C4 on C5, reflecting underlying facet disease. Mild degenerative change is noted about the dens. Skull  base and vertebrae: No acute fracture. There is mild chronic loss of height at vertebral body C5. No primary bone lesion or focal pathologic process. Soft tissues and spinal canal: No prevertebral fluid or swelling. No visible canal hematoma. Disc levels: Multilevel disc space narrowing is noted along the cervical spine, with underlying anterior and posterior disc osteophyte complexes. Upper chest: The visualized portions of the thyroid gland are unremarkable. The visualized lung apices are clear. Other: No additional soft tissue abnormalities are seen. IMPRESSION: 1. No evidence of traumatic intracranial injury or fracture. 2. No evidence of fracture or subluxation along the cervical spine. 3. Soft tissue swelling and laceration at the occiput. 4. 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 Given extension into the oropharynx, surgical consultation is recommended. Corresponding partial opacification of the right frontal sinus. 5. Mild cortical volume loss and scattered small vessel ischemic microangiopathy. 6. Mild degenerative change noted along the cervical spine. Electronically Signed   By: Garald Balding M.D.   On: 12/12/2017 21:18   Ct Cervical Spine Wo Contrast  Result Date: 12/12/2017 CLINICAL DATA:  Acute onset of syncope and fall. Hit head. Laceration at the back of  the head. Concern for cervical spine injury. EXAM: CT HEAD WITHOUT CONTRAST CT CERVICAL SPINE WITHOUT CONTRAST TECHNIQUE: Multidetector CT imaging of the head and cervical spine was performed following the standard protocol without intravenous contrast. Multiplanar CT image reconstructions of the cervical spine were also generated. COMPARISON:  Paranasal sinus CT performed 10/02/2014 FINDINGS: CT HEAD FINDINGS Brain: No evidence of acute infarction, hemorrhage, hydrocephalus, extra-axial collection or mass lesion / mass effect. Prominence of the ventricles and sulci reflects mild cortical volume loss. Scattered periventricular and subcortical white matter change likely reflects small vessel ischemic microangiopathy. The brainstem and fourth ventricle are within normal limits. The basal ganglia are unremarkable in appearance. The cerebral hemispheres demonstrate grossly normal gray-white differentiation. No mass effect or midline shift is seen. Vascular: No hyperdense vessel or unexpected calcification. Skull: There is no evidence of fracture; visualized osseous structures are unremarkable in appearance. Sinuses/Orbits: The visualized portions of the orbits are within normal limits. A polypoid mass is again noted filling the surgically opened right maxillary antrum and right-sided nasal passages, increased in size from the prior study and extending inferiorly into the oropharynx. There is partial opacification of the right frontal sinus. The remaining paranasal sinuses and mastoid air cells are well-aerated. Other: Soft tissue swelling and laceration are noted at the occiput. CT CERVICAL SPINE FINDINGS Alignment: There is grade 1 anterolisthesis of C4 on C5, reflecting underlying facet disease. Mild degenerative change is noted about the dens. Skull base and vertebrae: No acute fracture. There is mild chronic loss of height at vertebral body C5. No primary bone lesion or focal pathologic process. Soft tissues and  spinal canal: No prevertebral fluid or swelling. No visible canal hematoma. Disc levels: Multilevel disc space narrowing is noted along the cervical spine, with underlying anterior and posterior disc osteophyte complexes. Upper chest: The visualized portions of the thyroid gland are unremarkable. The visualized lung apices are clear. Other: No additional soft tissue abnormalities are seen. IMPRESSION: 1. No evidence of traumatic intracranial injury or fracture. 2. No evidence of fracture or subluxation along the cervical spine. 3. Soft tissue swelling and laceration at the occiput. 4. Enlarging polypoid mass filling the surgically open  right maxillary antrum and right-sided nasal passages. 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 Given extension into the oropharynx, surgical consultation is recommended. Corresponding partial opacification of the right frontal sinus. 5. Mild cortical volume loss and scattered small vessel ischemic microangiopathy. 6. Mild degenerative change noted along the cervical spine. Electronically Signed   By: Garald Balding M.D.   On: 12/12/2017 21:18     EKG: Independently reviewed.  Sinus rhythm, QTC 487, right axis deviation, poor R wave progression, anteroseptal infarction pattern, ?first-degree AV block.  Assessment/Plan Principal Problem:   Syncope Active Problems:   Type II diabetes mellitus with renal manifestations (HCC)   Chronic diastolic CHF (congestive heart failure) (HCC)   CAD (coronary artery disease)   HLD (hyperlipidemia)   COPD (chronic obstructive pulmonary disease) (HCC)   CKD (chronic kidney disease), stage III (HCC)   Atrial fibrillation (HCC)   History of recent fall   Maxillary sinus mass   GERD (gastroesophageal reflux disease)   Fall   Syncope and fall Etiology is not completely clear.  CT head and C-spine negative for acute intracranial  abnormalities except for showing recurrent maxillary mass.  No focal neurologic findings on physical examination, less like to have stroke. 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 His HR is 30-130s in ED. currently hemodynamically stable.  Cardiology, Dr. Evelena Asa was consulted.  - will admit to SDU as inpt. - packer at bedside and put on pacer pad. Per Dr. Evelena Asa, it pt developed symptomatic bradycardia again, will try PRN atropine first.  If atropine not working, will start pacer, and will need transfer to ICU. - prn atropine 0.5 mg for symptomatic bradycardia per Dr. Evelena Asa. - cycle CE q6 x3 and repeat EKG in the am  - will check FLP and A1c TSH - hold bystolic - 2d echo - wound care for scalp laceration - pt.ot when able to (not ordered yet)  CAD (coronary artery disease): s/p of CABG and stent. No chest pain -continue aspirin, Plavix, Lipitor -prn NTG  Type II diabetes mellitus with renal manifestations ([place]): Last A1c 6.3 on 09/12/17, well controled. Patient is taking Amaryl at home -SSI  Chronic diastolic CHF (congestive heart failure) ([place]): 2D echo on 09/13/2017 showed EF 55 to 60% with grade 1 diastolic dysfunction.  Patient has trace leg edema, but no shortness of breath.  CHF seems to be compensated. -Continue home dose Lasix, 60 mg twice daily  HLD (hyperlipidemia): -lipitor  CKD (chronic kidney disease), stage III: stable.  Baseline creatinine 1.6-1.8.  His creatinine is 1.79 and  BUN 21 -f/u renal fx by BMP  COPD (chronic obstructive pulmonary disease) ([place]): stable -Bronchodilators  Atrial Fibrillation: CHA2DS2-VASc Score is 6, needs oral anticoagulation,but patient is not on AC, likely due to history of GI bleeding.  -hold bystolic due to bradycardia  Maxillary sinus mass: recurrent issue -may need consult ENT or sugeon  GERD: -Protonix   DVT ppx: SCD Code Status: Full code Family Communication: None at bed side.    Disposition Plan:   Anticipate discharge back to previous home environment Consults called:  Card, Dr. Evelena Asa Admission status:  SDU/inpation       Date of Service 12/13/2017    Ivor Costa [facility] Pager [phone]  If 7PM-7AM, please contact night-coverage [email or web address] Password [email or web address] 12/13/2017, 1:08 AM

## 2017-12-12 NOTE — ED Notes (Signed)
ED Provider at bedside. Olen Pel

## 2017-12-12 NOTE — ED Notes (Signed)
Patient transported to X-ray 

## 2017-12-12 NOTE — ED Notes (Signed)
Patient back from Xray and CT. Blood present at nose.

## 2017-12-13 ENCOUNTER — Other Ambulatory Visit (HOSPITAL_COMMUNITY): Payer: Medicare Other

## 2017-12-13 ENCOUNTER — Other Ambulatory Visit: Payer: Self-pay

## 2017-12-13 DIAGNOSIS — R55 Syncope and collapse: Secondary | ICD-10-CM

## 2017-12-13 DIAGNOSIS — I441 Atrioventricular block, second degree: Secondary | ICD-10-CM

## 2017-12-13 LAB — CBG MONITORING, ED
GLUCOSE-CAPILLARY: 78 mg/dL (ref 65–99)
Glucose-Capillary: 74 mg/dL (ref 65–99)
Glucose-Capillary: 84 mg/dL (ref 65–99)
Glucose-Capillary: 94 mg/dL (ref 65–99)
Glucose-Capillary: 184 mg/dL — ABNORMAL HIGH (ref 65–99)

## 2017-12-13 LAB — HEMOGLOBIN A1C
HEMOGLOBIN A1C: 5.8 % — AB (ref 4.8–5.6)
Mean Plasma Glucose: 119.76 mg/dL

## 2017-12-13 LAB — TSH: TSH: 2.534 u[IU]/mL (ref 0.350–4.500)

## 2017-12-13 LAB — LIPID PANEL
Cholesterol: 118 mg/dL (ref 0–200)
HDL: 41 mg/dL (ref >40)
LDL CALC: 61 mg/dL (ref 0–99)
Total CHOL/HDL Ratio: 2.9 RATIO
Triglycerides: 82 mg/dL (ref <150)
VLDL: 16 mg/dL (ref 0–40)

## 2017-12-13 LAB — TROPONIN I: Troponin I: <0.03 ng/mL (ref <0.03)

## 2017-12-13 MED ORDER — MUPIROCIN 2 % EX OINT
TOPICAL_OINTMENT | Freq: Every day | CUTANEOUS | Status: DC
  Administered 2017-12-13 – 2017-12-14 (×2): via TOPICAL
  Filled 2017-12-13: qty 22

## 2017-12-13 NOTE — ED Notes (Signed)
ED TO INPATIENT HANDOFF REPORT  Name/Age/Gender [name] 84 y.o. male  Code Status    Code Status Orders  (From admission, onward)        Start     Ordered   12/12/17 2334  Full code  Continuous     12/12/17 2335    Code Status History    Date Active Date Inactive Code Status Order ID Comments User Context   09/12/2017 1818 09/13/2017 1808 Full Code [id number]  Erline Hau, MD Inpatient   10/04/2014 0551 10/13/2014 1917 Full Code [id number]  [name], MD Inpatient   08/07/2013 1344 08/10/2013 1826 Full Code [id number]  Charlie Pitter, PA-C Inpatient   07/11/2013 1728 07/13/2013 1926 Full Code [id number]  Eugenie Filler, MD Inpatient   07/02/2013 1525 07/05/2013 1357 Full Code [id number]  Velvet Bathe, MD Inpatient      Home/SNF/Other Home  Chief Complaint fall  Level of Care/Admitting Diagnosis ED Disposition    ED Disposition Condition [place]: [facility] [[id number]]  Level of Care: Stepdown [14]  Diagnosis: Syncope [542706]  Admitting Physician: Ivor Costa [[id number]]  Attending Physician: Ivor Costa [phone]  Estimated length of stay: past midnight tomorrow  Certification:: I certify this patient will need inpatient services for at least 2 midnights  PT Class (Do Not Modify): Inpatient [101]  PT Acc Code (Do Not Modify): Private [1]       Medical History Past Medical History:  Diagnosis Date  . Acute blood loss anemia    a. 06/2013 due to GIB.  Marland Kitchen Arthritis    "knees; hands" (07/11/2013)  . Asthma   . CAD (coronary artery disease)    a. s/p CABG x 3 in 1995; b.  NSTEMI 12/2011 -> Cath 6/21: LM 20-30, LAD 90, RI 95, LCX 78m RCA 90/1013mtreated w/ 2.0x16 Promus DES), VG->RCA ok, VG->RI 70-8055mreated w/ 4.0x28 Promus), LIMA->LAD ok, EF 55-60%.  . Chronic diastolic CHF (congestive heart failure) ([facility]  a. Echo 10/2011: EF 55-60%;  b. elevated EDP req diuresis.  . Chronic respiratory failure ([facility]12/24/2014   a. 3L home O2 24/7.  . CMarland KitchenD (chronic kidney disease)    a. ?Based on Cr 2013 - 1.3-1.7.  . COPD (chronic obstructive pulmonary disease) ([facility]  a. on home O2  . GERD (gastroesophageal reflux disease)   . GI bleed    a. 06/2013 adm - prepyloric ulcer, path neg for malignancy. A/w ABL anemia.  . HMarland KitchenD (hyperlipidemia)   . HOH (hard of hearing)    left ear  . HTN (hypertension)   . Hypothyroidism   . Morbid obesity ([facility] . Orthopnea    a. Has been sleeping in a recliner x 30 yrs.  . Sleep apnea   . Syncope     Allergies Allergies  Allergen Reactions  . Other Other (See Comments)    Cream cheese-makes very sick    IV Location/Drains/Wounds Patient Lines/Drains/Airways Status   Active Line/Drains/Airways    Name:   Placement date:   Placement time:   Site:   Days:   Peripheral IV 12/12/17 Left Hand   12/12/17    2006    Hand   1   Peripheral IV 12/12/17 Right Forearm   12/12/17    2027    Forearm   1   Wound 08/07/13 Other (Comment) Foot Bilateral   08/07/13    2000  Foot   1589          Labs/Imaging Results for orders placed or performed during the hospital encounter of 12/12/17 (from the past 48 hour(s))  Basic metabolic panel     Status: Abnormal   Collection Time: 12/12/17  8:28 PM  Result Value Ref Range   Sodium 138 135 - 145 mmol/L   Potassium 3.9 3.5 - 5.1 mmol/L   Chloride 96 (L) 101 - 111 mmol/L   CO2 34 (H) 22 - 32 mmol/L   Glucose, Bld 205 (H) 65 - 99 mg/dL   BUN 21 (H) 6 - 20 mg/dL   Creatinine, Ser 1.79 (H) 0.61 - 1.24 mg/dL   Calcium 8.7 (L) 8.9 - 10.3 mg/dL   GFR calc non Af Amer 33 (L) >60 mL/min   GFR calc Af Amer 38 (L) >60 mL/min    Comment: (NOTE) The eGFR has been calculated using the CKD EPI equation. This calculation has not been validated in all clinical situations. eGFR's persistently <60 mL/min signify possible Chronic Kidney Disease.    Anion gap 8 5 - 15    Comment: Performed at [facility] [address], [place] [address]  CBC     Status: None   Collection Time: 12/12/17  8:28 PM  Result Value Ref Range   WBC 8.2 4.0 - 10.5 K/uL   RBC 4.48 4.22 - 5.81 MIL/uL   Hemoglobin 13.4 13.0 - 17.0 g/dL   HCT 42.9 39.0 - 52.0 %   MCV 95.8 78.0 - 100.0 fL   MCH 29.9 26.0 - 34.0 pg   MCHC 31.2 30.0 - 36.0 g/dL   RDW 13.5 11.5 - 15.5 %   Platelets 180 150 - 400 K/uL    Comment: Performed at [facility], [place] [address], [place], [place] [address]  Urinalysis, Routine w reflex microscopic     Status: Abnormal   Collection Time: 12/12/17  8:31 PM  Result Value Ref Range   Color, Urine YELLOW YELLOW   APPearance CLEAR CLEAR   Specific Gravity, Urine 1.012 1.005 - 1.030   pH 7.0 5.0 - 8.0   Glucose, UA 50 (A) NEGATIVE mg/dL   Hgb urine dipstick NEGATIVE NEGATIVE   Bilirubin Urine NEGATIVE NEGATIVE   Ketones, ur NEGATIVE NEGATIVE mg/dL   Protein, ur NEGATIVE NEGATIVE mg/dL   Nitrite NEGATIVE NEGATIVE   Leukocytes, UA NEGATIVE NEGATIVE    Comment: Performed at [facility] [address], [place], [place] [address]  I-Stat Troponin, ED (not at [facility])     Status: None   Collection Time: 12/12/17  8:33 PM  Result Value Ref Range   Troponin i, poc 0.01 0.00 - 0.08 ng/mL   Comment 3            Comment: Due to the release kinetics of cTnI, a negative result within the first hours of the onset of symptoms does not rule out myocardial infarction with certainty. If myocardial infarction is still suspected, repeat the test at appropriate intervals.   CBG monitoring, ED     Status: None   Collection Time: 12/13/17  4:42 AM  Result Value Ref Range   Glucose-Capillary 74 65 - 99 mg/dL  Hemoglobin A1c     Status: Abnormal   Collection Time: 12/13/17  4:42 AM  Result Value Ref Range   Hgb A1c MFr Bld 5.8 (H) 4.8 - 5.6 %    Comment: (NOTE) Pre diabetes:  5.7%-6.4% Diabetes:              >6.4% Glycemic control for   <7.0% adults with diabetes    Mean Plasma Glucose  119.76 mg/dL    Comment: Performed at [facility] [address] [place], [place] [address]  Lipid panel     Status: None   Collection Time: 12/13/17  4:42 AM  Result Value Ref Range   Cholesterol 118 0 - 200 mg/dL   Triglycerides 82 <150 mg/dL   HDL 41 >40 mg/dL   Total CHOL/HDL Ratio 2.9 RATIO   VLDL 16 0 - 40 mg/dL   LDL Cholesterol 61 0 - 99 mg/dL    Comment:        Total Cholesterol/HDL:CHD Risk Coronary Heart Disease Risk Table                     Men   Women  1/2 Average Risk   3.4   3.3  Average Risk       5.0   4.4  2 X Average Risk   9.6   7.1  3 X Average Risk  23.4   11.0        Use the calculated Patient Ratio above and the CHD Risk Table to determine the patient's CHD Risk.        ATP III CLASSIFICATION (LDL):  <100     mg/dL   Optimal  100-129  mg/dL   Near or Above                    Optimal  130-159  mg/dL   Borderline  160-189  mg/dL   High  >190     mg/dL   Very High Performed at [facility] [address], [place], [place] [address]   TSH     Status: None   Collection Time: 12/13/17  4:42 AM  Result Value Ref Range   TSH 2.534 0.350 - 4.500 uIU/mL    Comment: Performed by a 3rd Generation assay with a functional sensitivity of <=0.01 uIU/mL. Performed at [facility], [place] [address] [place], [place] [address]   Troponin I (q 6hr x 3)     Status: None   Collection Time: 12/13/17  4:43 AM  Result Value Ref Range   Troponin I <0.03 <0.03 ng/mL    Comment: Performed at [facility] [address], [place], [place] [address]  CBG monitoring, ED     Status: None   Collection Time: 12/13/17  8:48 AM  Result Value Ref Range   Glucose-Capillary 84 65 - 99 mg/dL  CBG monitoring, ED     Status: None   Collection Time: 12/13/17 12:24 PM  Result Value Ref Range   Glucose-Capillary 78 65 - 99 mg/dL  Troponin I (q 6hr x 3)     Status: None   Collection Time: 12/13/17  1:35 PM  Result Value Ref Range   Troponin I <0.03 <0.03  ng/mL    Comment: Performed at [facility], [place] [address] [place], [place] [address]  CBG monitoring, ED     Status: None   Collection Time: 12/13/17  4:38 PM  Result Value Ref Range   Glucose-Capillary 94 65 - 99 mg/dL  CBG monitoring, ED     Status: Abnormal   Collection Time: 12/13/17  8:16 PM  Result Value Ref Range   Glucose-Capillary 184 (H) 65 -  99 mg/dL   Dg Chest 2 View  Result Date: 12/12/2017 CLINICAL DATA:  Syncope. EXAM: CHEST - 2 VIEW COMPARISON:  09/12/2017. FINDINGS: The heart size appears within normal limits. Previous median sternotomy CABG procedure. Decreased lung volumes. There is no pleural effusion or edema. No airspace opacities identified. IMPRESSION: No active cardiopulmonary disease. Electronically Signed   By: Kerby Moors M.D.   On: 12/12/2017 20:49   Ct Head Wo Contrast  Result Date: 12/12/2017 CLINICAL DATA:  Acute onset of syncope and fall. Hit head. Laceration at the back of the head. Concern for cervical spine injury. EXAM: CT HEAD WITHOUT CONTRAST CT CERVICAL SPINE WITHOUT CONTRAST TECHNIQUE: Multidetector CT imaging of the head and cervical spine was performed following the standard protocol without intravenous contrast. Multiplanar CT image reconstructions of the cervical spine were also generated. COMPARISON:  Paranasal sinus CT performed 10/02/2014 FINDINGS: CT HEAD FINDINGS Brain: No evidence of acute infarction, hemorrhage, hydrocephalus, extra-axial collection or mass lesion / mass effect. Prominence of the ventricles and sulci reflects mild cortical volume loss. Scattered periventricular and subcortical white matter change likely reflects small vessel ischemic microangiopathy. The brainstem and fourth ventricle are within normal limits. The basal ganglia are unremarkable in appearance. The cerebral hemispheres demonstrate grossly normal gray-white differentiation. No mass effect or midline shift is seen. Vascular: No hyperdense vessel or  unexpected calcification. Skull: There is no evidence of fracture; visualized osseous structures are unremarkable in appearance. Sinuses/Orbits: The visualized portions of the orbits are within normal limits. A polypoid mass is again noted filling the surgically opened right maxillary antrum and right-sided nasal passages, increased in size from the prior study and extending inferiorly into the oropharynx. There is partial opacification of the right frontal sinus. The remaining paranasal sinuses and mastoid air cells are well-aerated. Other: Soft tissue swelling and laceration are noted at the occiput. CT CERVICAL SPINE FINDINGS Alignment: There is grade 1 anterolisthesis of C4 on C5, reflecting underlying facet disease. Mild degenerative change is noted about the dens. Skull base and vertebrae: No acute fracture. There is mild chronic loss of height at vertebral body C5. No primary bone lesion or focal pathologic process. Soft tissues and spinal canal: No prevertebral fluid or swelling. No visible canal hematoma. Disc levels: Multilevel disc space narrowing is noted along the cervical spine, with underlying anterior and posterior disc osteophyte complexes. Upper chest: The visualized portions of the thyroid gland are unremarkable. The visualized lung apices are clear. Other: No additional soft tissue abnormalities are seen. IMPRESSION: 1. No evidence of traumatic intracranial injury or fracture. 2. No evidence of fracture or subluxation along the cervical spine. 3. Soft tissue swelling and laceration at the occiput. 4. 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 Given extension into the oropharynx, surgical consultation is recommended. Corresponding partial opacification of the right frontal sinus. 5. Mild cortical  volume loss and scattered small vessel ischemic microangiopathy. 6. Mild degenerative change noted along the cervical spine. Electronically Signed   By: Garald Balding M.D.   On: 12/12/2017 21:18   Ct Cervical Spine Wo Contrast  Result Date: 12/12/2017 CLINICAL DATA:  Acute onset of syncope and fall. Hit head. Laceration at the back of the head. Concern for cervical spine injury. EXAM: CT HEAD WITHOUT CONTRAST CT CERVICAL SPINE WITHOUT CONTRAST TECHNIQUE: Multidetector CT imaging of the head and cervical  spine was performed following the standard protocol without intravenous contrast. Multiplanar CT image reconstructions of the cervical spine were also generated. COMPARISON:  Paranasal sinus CT performed 10/02/2014 FINDINGS: CT HEAD FINDINGS Brain: No evidence of acute infarction, hemorrhage, hydrocephalus, extra-axial collection or mass lesion / mass effect. Prominence of the ventricles and sulci reflects mild cortical volume loss. Scattered periventricular and subcortical white matter change likely reflects small vessel ischemic microangiopathy. The brainstem and fourth ventricle are within normal limits. The basal ganglia are unremarkable in appearance. The cerebral hemispheres demonstrate grossly normal gray-white differentiation. No mass effect or midline shift is seen. Vascular: No hyperdense vessel or unexpected calcification. Skull: There is no evidence of fracture; visualized osseous structures are unremarkable in appearance. Sinuses/Orbits: The visualized portions of the orbits are within normal limits. A polypoid mass is again noted filling the surgically opened right maxillary antrum and right-sided nasal passages, increased in size from the prior study and extending inferiorly into the oropharynx. There is partial opacification of the right frontal sinus. The remaining paranasal sinuses and mastoid air cells are well-aerated. Other: Soft tissue swelling and laceration are noted at the occiput. CT  CERVICAL SPINE FINDINGS Alignment: There is grade 1 anterolisthesis of C4 on C5, reflecting underlying facet disease. Mild degenerative change is noted about the dens. Skull base and vertebrae: No acute fracture. There is mild chronic loss of height at vertebral body C5. No primary bone lesion or focal pathologic process. Soft tissues and spinal canal: No prevertebral fluid or swelling. No visible canal hematoma. Disc levels: Multilevel disc space narrowing is noted along the cervical spine, with underlying anterior and posterior disc osteophyte complexes. Upper chest: The visualized portions of the thyroid gland are unremarkable. The visualized lung apices are clear. Other: No additional soft tissue abnormalities are seen. IMPRESSION: 1. No evidence of traumatic intracranial injury or fracture. 2. No evidence of fracture or subluxation along the cervical spine. 3. Soft tissue swelling and laceration at the occiput. 4. 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 Given extension into the oropharynx, surgical consultation is recommended. Corresponding partial opacification of the right frontal sinus. 5. Mild cortical volume loss and scattered small vessel ischemic microangiopathy. 6. Mild degenerative change noted along the cervical spine. Electronically Signed   By: Garald Balding M.D.   On: 12/12/2017 21:18    Pending Labs Unresulted Labs (From admission, onward)   Start     Ordered   12/14/17 0500  CBC  Tomorrow morning,   R     12/13/17 1137   12/14/17 8916  Basic metabolic panel  Tomorrow morning,   R     12/13/17 1137   12/12/17 2333  Troponin I (q 6hr x 3)  Now then every 6 hours,   R     12/12/17 2332      Vitals/Pain Today's Vitals   12/13/17 1900 12/13/17 1926 12/13/17 1930 12/13/17 2030  BP:  112/69  126/63 116/68  Pulse: 77  77 68  Resp:    16  Temp:      TempSrc:      SpO2: 97%  97% 98%  Weight:      Height:      PainSc:  Asleep      Isolation Precautions No active isolations  Medications Medications  isosorbide mononitrate (IMDUR) 24 hr tablet 30 mg (0  mg Oral Hold 12/13/17 0431)  albuterol (PROVENTIL) (2.5 MG/3ML) 0.083% nebulizer solution 2.5 mg (has no administration in time range)  aspirin EC tablet 81 mg (81 mg Oral Given 12/13/17 1330)  atorvastatin (LIPITOR) tablet 80 mg (80 mg Oral Given 12/13/17 2047)  mometasone-formoterol (DULERA) 200-5 MCG/ACT inhaler 2 puff (2 puffs Inhalation Given 12/13/17 2018)  clopidogrel (PLAVIX) tablet 75 mg (75 mg Oral Given 12/13/17 0933)  fluticasone (FLONASE) 50 MCG/ACT nasal spray 2 spray (2 sprays Each Nare Given 12/13/17 1331)  furosemide (LASIX) tablet 60 mg (60 mg Oral Given 12/13/17 2017)  isosorbide mononitrate (IMDUR) 24 hr tablet 30 mg (30 mg Oral Given 12/13/17 1330)  montelukast (SINGULAIR) tablet 10 mg (10 mg Oral Given 12/13/17 0443)  multivitamin with minerals tablet 1 tablet (1 tablet Oral Given 12/13/17 1330)  nitroGLYCERIN (NITROSTAT) SL tablet 0.4 mg (has no administration in time range)  pantoprazole (PROTONIX) EC tablet 40 mg (40 mg Oral Given 12/13/17 1330)  tiotropium (SPIRIVA) inhalation capsule 18 mcg (18 mcg Inhalation Given 12/13/17 0912)  atropine 1 MG/10ML injection 0.5 mg (has no administration in time range)  insulin aspart (novoLOG) injection 0-9 Units (0 Units Subcutaneous Not Given 12/13/17 0848)  insulin aspart (novoLOG) injection 0-5 Units (0 Units Subcutaneous Not Given 12/13/17 0442)  sodium chloride flush (NS) 0.9 % injection 3 mL (3 mLs Intravenous Not Given 12/13/17 1807)  acetaminophen (TYLENOL) tablet 650 mg (has no administration in time range)    Or  acetaminophen (TYLENOL) suppository 650 mg (has no administration in time range)  polyethylene glycol (MIRALAX / GLYCOLAX) packet 17 g (has no  administration in time range)  ondansetron (ZOFRAN) tablet 4 mg (has no administration in time range)    Or  ondansetron (ZOFRAN) injection 4 mg (has no administration in time range)  hydrALAZINE (APRESOLINE) injection 5 mg (has no administration in time range)  zolpidem (AMBIEN) tablet 5 mg (5 mg Oral Given 12/13/17 2047)  mupirocin ointment (BACTROBAN) 2 % ( Topical Given 12/13/17 1331)  lidocaine-EPINEPHrine (XYLOCAINE W/EPI) 2 %-1:200000 (PF) injection 10 mL (10 mLs Intradermal Given by Other 12/12/17 2306)  Tdap (BOOSTRIX) injection 0.5 mL (0.5 mLs Intramuscular Given 12/12/17 2304)    Mobility walks with device

## 2017-12-13 NOTE — ED Notes (Signed)
Paged cardmaster to [employer], RN

## 2017-12-13 NOTE — ED Notes (Signed)
Ambulate patient to bathroom with a walker.pt.had bowel movement.pt.did good using  A walker.

## 2017-12-13 NOTE — Consult Note (Signed)
[facility] Nurse wound consult note Reason for Consult: Fall at home with abrasion to left posterior occiput.  Skin flap not approximated to skin tear.  Requesting to eat and informed that he is NPO due to possible tests and procedures this morning.  Verbalizes understanding.  Wound type:trauma Pressure Injury POA: NA Measurement: 0.3 cm x 2.4 cm x 0.1 cm skin tear, with unapproximated edges Wound GBM:SXJD and moist Drainage (amount, consistency, odor) minimal serosanguinous Periwound:intact Dressing procedure/placement/frequency:Cleanse skin tear to left posterior head and surrounding hair with soap and water.  Place pea sized amount mupirocin ointment to wound bed. Apply daily.  Leave open to air due to hair.  Will not follow at this time.  Please re-consult if needed.  Domenic Moras RN BSN [place] Pager [phone]

## 2017-12-13 NOTE — ED Notes (Signed)
Wd care performed; SN cleansed occipital wd with NS, patted dry, applied bactroban, covered with dsd, secured with kling; pt tolerated well.

## 2017-12-13 NOTE — Progress Notes (Signed)
PROGRESS NOTE  [name]:[id number] DOB: 04-14-1931 DOA: 12/12/2017 PCP: Lawerance Cruel, MD  HPI/Recap of past 24 hours: [name] is a 84 y.o. male with medical history significant of dCHF, COPD, hyperlipidemia, diabetes mellitus, GERD, CAD, CABG, stent placement, CKD 3, GI bleeding, atrial fibrillation not on anticoagulants, obesity, benign sinus mass status post resection, who presents with syncope.  With no prodromal symptoms.  Was in his usual state of health prior to this.  Reports similar incident a few weeks ago.  Follows up with cardiology.  On presentation to the ED patient was found to have suspected tachybrady syndrome.  Cardiology consulted.  CT head unremarkable for any acute acute intracranial findings.  Positive right maxillary mass.  12/13/2017: Patient seen and examined at his bedside.  He has no new complaints.  He denies any chest pain, palpitations or dyspnea.    Assessment/Plan: Principal Problem:   Syncope Active Problems:   Type II diabetes mellitus with renal manifestations (HCC)   Chronic diastolic CHF (congestive heart failure) (HCC)   CAD (coronary artery disease)   HLD (hyperlipidemia)   COPD (chronic obstructive pulmonary disease) (HCC)   CKD (chronic kidney disease), stage III (HCC)   Atrial fibrillation (HCC)   History of recent fall   Maxillary sinus mass   GERD (gastroesophageal reflux disease)   Fall   Syncope, suspect cardiogenic Cardiology following Tachybrady syndrome suspected Per cardiology EP will be contacted CT head no evidence of acute intracranial findings Soft tissue swelling and laceration of the occiput Right maxillary mass  Suspected tachybradycardia syndrome Cardiology following Per cardiology EP will be contacted Pacing pads and atropine at bedside Continue to hold Bystolic  Chronic atrial fibrillation not on anticoagulation As stated above  Chronic diastolic CHF Last 2D echo revealed LVEF 55 to 60%  with grade 1 diastolic dysfunction on 0/99/8338 Hold off Bystolic Continue Imdur Continue Lasix 60 mg twice daily p.o Continue strict I's and O's and daily weight.  Hyperlipidemia Continue Lipitor 80 mg daily  Coronary artery disease status post CABG Continue aspirin Plavix and statin  COPD Continue Dulera, Singulair, Spiriva  GERD Stable Continue Protonix  Right maxillary mass/history of sinus mass Noted on CT head done on 12/12/17 Outpatient follow-up    Code Status: Full code  Family Communication: None at bedside  Disposition Plan: Home when clinically stable   Consultants:  Cardiology  Electrophysiology  Procedures:  None  Antimicrobials:  None  DVT prophylaxis:   SCDs  Objective: Vitals:   12/13/17 0915 12/13/17 0930 12/13/17 0945 12/13/17 1000  BP: 120/66 134/74 139/74 139/68  Pulse: 63 (!) 108 (!) 45 60  Resp: 19 17 18 17   Temp:      TempSrc:      SpO2: 98% 97% 99% 99%  Weight:      Height:        Intake/Output Summary (Last 24 hours) at 12/13/2017 1116 Last data filed at 12/13/2017 0843 Gross per 24 hour  Intake -  Output 400 ml  Net -400 ml   Filed Weights   12/12/17 2016  Weight: 117.9 kg (260 lb)    Exam:  . General: 84 y.o. year-old male well developed well nourished in no acute distress.  Alert and oriented x3.  Laceration from the fall in the back of his head. . Cardiovascular: Irregular rate and rhythm with no rubs or gallops.  No thyromegaly or JVD noted.   Marland Kitchen Respiratory: Clear to auscultation with no wheezes or rales.  Good inspiratory effort. . Abdomen: Soft nontender nondistended with normal bowel sounds x4 quadrants. . Musculoskeletal: No lower extremity edema. 2/4 pulses in all 4 extremities. . Skin: No ulcerative lesions noted or rashes, . Psychiatry: Mood is appropriate for condition and setting   Data Reviewed: CBC: Recent Labs  Lab 12/12/17 2028  WBC 8.2  HGB 13.4  HCT 42.9  MCV 95.8  PLT 784    Basic Metabolic Panel: Recent Labs  Lab 12/12/17 2028  NA 138  K 3.9  CL 96*  CO2 34*  GLUCOSE 205*  BUN 21*  CREATININE 1.79*  CALCIUM 8.7*   GFR: Estimated Creatinine Clearance: 39.4 mL/min (A) (by C-G formula based on SCr of 1.79 mg/dL (H)). Liver Function Tests: No results for input(s): AST, ALT, ALKPHOS, BILITOT, PROT, ALBUMIN in the last 168 hours. No results for input(s): LIPASE, AMYLASE in the last 168 hours. No results for input(s): AMMONIA in the last 168 hours. Coagulation Profile: No results for input(s): INR, PROTIME in the last 168 hours. Cardiac Enzymes: Recent Labs  Lab 12/13/17 0443  TROPONINI <0.03   BNP (last 3 results) No results for input(s): PROBNP in the last 8760 hours. HbA1C: Recent Labs    12/13/17 0442  HGBA1C 5.8*   CBG: Recent Labs  Lab 12/13/17 0442 12/13/17 0848  GLUCAP 74 84   Lipid Profile: Recent Labs    12/13/17 0442  CHOL 118  HDL 41  LDLCALC 61  TRIG 82  CHOLHDL 2.9   Thyroid Function Tests: Recent Labs    12/13/17 0442  TSH 2.534   Anemia Panel: No results for input(s): VITAMINB12, FOLATE, FERRITIN, TIBC, IRON, RETICCTPCT in the last 72 hours. Urine analysis:    Component Value Date/Time   COLORURINE YELLOW 12/12/2017 2031   APPEARANCEUR CLEAR 12/12/2017 2031   LABSPEC 1.012 12/12/2017 2031   PHURINE 7.0 12/12/2017 2031   GLUCOSEU 50 (A) 12/12/2017 2031   HGBUR NEGATIVE 12/12/2017 2031   BILIRUBINUR NEGATIVE 12/12/2017 2031   KETONESUR NEGATIVE 12/12/2017 2031   PROTEINUR NEGATIVE 12/12/2017 2031   UROBILINOGEN 0.2 10/05/2014 1300   NITRITE NEGATIVE 12/12/2017 2031   LEUKOCYTESUR NEGATIVE 12/12/2017 2031   Sepsis Labs: @LABRCNTIP (procalcitonin:4,lacticidven:4)  )No results found for this or any previous visit (from the past 240 hour(s)).    Studies: Dg Chest 2 View  Result Date: 12/12/2017 CLINICAL DATA:  Syncope. EXAM: CHEST - 2 VIEW COMPARISON:  09/12/2017. FINDINGS: The heart size appears  within normal limits. Previous median sternotomy CABG procedure. Decreased lung volumes. There is no pleural effusion or edema. No airspace opacities identified. IMPRESSION: No active cardiopulmonary disease. Electronically Signed   By: Kerby Moors M.D.   On: 12/12/2017 20:49   Ct Head Wo Contrast  Result Date: 12/12/2017 CLINICAL DATA:  Acute onset of syncope and fall. Hit head. Laceration at the back of the head. Concern for cervical spine injury. EXAM: CT HEAD WITHOUT CONTRAST CT CERVICAL SPINE WITHOUT CONTRAST TECHNIQUE: Multidetector CT imaging of the head and cervical spine was performed following the standard protocol without intravenous contrast. Multiplanar CT image reconstructions of the cervical spine were also generated. COMPARISON:  Paranasal sinus CT performed 10/02/2014 FINDINGS: CT HEAD FINDINGS Brain: No evidence of acute infarction, hemorrhage, hydrocephalus, extra-axial collection or mass lesion / mass effect. Prominence of the ventricles and sulci reflects mild cortical volume loss. Scattered periventricular and subcortical white matter change likely reflects small vessel ischemic microangiopathy. The brainstem and fourth ventricle are within normal limits. The basal ganglia are unremarkable in  appearance. The cerebral hemispheres demonstrate grossly normal gray-white differentiation. No mass effect or midline shift is seen. Vascular: No hyperdense vessel or unexpected calcification. Skull: There is no evidence of fracture; visualized osseous structures are unremarkable in appearance. Sinuses/Orbits: The visualized portions of the orbits are within normal limits. A polypoid mass is again noted filling the surgically opened right maxillary antrum and right-sided nasal passages, increased in size from the prior study and extending inferiorly into the oropharynx. There is partial opacification of the right frontal sinus. The remaining paranasal sinuses and mastoid air cells are well-aerated.  Other: Soft tissue swelling and laceration are noted at the occiput. CT CERVICAL SPINE FINDINGS Alignment: There is grade 1 anterolisthesis of C4 on C5, reflecting underlying facet disease. Mild degenerative change is noted about the dens. Skull base and vertebrae: No acute fracture. There is mild chronic loss of height at vertebral body C5. No primary bone lesion or focal pathologic process. Soft tissues and spinal canal: No prevertebral fluid or swelling. No visible canal hematoma. Disc levels: Multilevel disc space narrowing is noted along the cervical spine, with underlying anterior and posterior disc osteophyte complexes. Upper chest: The visualized portions of the thyroid gland are unremarkable. The visualized lung apices are clear. Other: No additional soft tissue abnormalities are seen. IMPRESSION: 1. No evidence of traumatic intracranial injury or fracture. 2. No evidence of fracture or subluxation along the cervical spine. 3. Soft tissue swelling and laceration at the occiput. 4. 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 Given extension into the oropharynx, surgical consultation is recommended. Corresponding partial opacification of the right frontal sinus. 5. Mild cortical volume loss and scattered small vessel ischemic microangiopathy. 6. Mild degenerative change noted along the cervical spine. Electronically Signed   By: Garald Balding M.D.   On: 12/12/2017 21:18   Ct Cervical Spine Wo Contrast  Result Date: 12/12/2017 CLINICAL DATA:  Acute onset of syncope and fall. Hit head. Laceration at the back of the head. Concern for cervical spine injury. EXAM: CT HEAD WITHOUT CONTRAST CT CERVICAL SPINE WITHOUT CONTRAST TECHNIQUE: Multidetector CT imaging of the head and cervical spine was performed  following the standard protocol without intravenous contrast. Multiplanar CT image reconstructions of the cervical spine were also generated. COMPARISON:  Paranasal sinus CT performed 10/02/2014 FINDINGS: CT HEAD FINDINGS Brain: No evidence of acute infarction, hemorrhage, hydrocephalus, extra-axial collection or mass lesion / mass effect. Prominence of the ventricles and sulci reflects mild cortical volume loss. Scattered periventricular and subcortical white matter change likely reflects small vessel ischemic microangiopathy. The brainstem and fourth ventricle are within normal limits. The basal ganglia are unremarkable in appearance. The cerebral hemispheres demonstrate grossly normal gray-white differentiation. No mass effect or midline shift is seen. Vascular: No hyperdense vessel or unexpected calcification. Skull: There is no evidence of fracture; visualized osseous structures are unremarkable in appearance. Sinuses/Orbits: The visualized portions of the orbits are within normal limits. A polypoid mass is again noted filling the surgically opened right maxillary antrum and right-sided nasal passages, increased in size from the prior study and extending inferiorly into the oropharynx. There is partial opacification of the right frontal sinus. The remaining paranasal sinuses and mastoid air cells are well-aerated. Other: Soft tissue swelling and laceration are noted at the occiput. CT CERVICAL SPINE FINDINGS Alignment: There is grade 1 anterolisthesis of C4 on  C5, reflecting underlying facet disease. Mild degenerative change is noted about the dens. Skull base and vertebrae: No acute fracture. There is mild chronic loss of height at vertebral body C5. No primary bone lesion or focal pathologic process. Soft tissues and spinal canal: No prevertebral fluid or swelling. No visible canal hematoma. Disc levels: Multilevel disc space narrowing is noted along the cervical spine, with underlying anterior and posterior  disc osteophyte complexes. Upper chest: The visualized portions of the thyroid gland are unremarkable. The visualized lung apices are clear. Other: No additional soft tissue abnormalities are seen. IMPRESSION: 1. No evidence of traumatic intracranial injury or fracture. 2. No evidence of fracture or subluxation along the cervical spine. 3. Soft tissue swelling and laceration at the occiput. 4. 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 Given extension into the oropharynx, surgical consultation is recommended. Corresponding partial opacification of the right frontal sinus. 5. Mild cortical volume loss and scattered small vessel ischemic microangiopathy. 6. Mild degenerative change noted along the cervical spine. Electronically Signed   By: Garald Balding M.D.   On: 12/12/2017 21:18    Scheduled Meds: . aspirin EC  81 mg Oral Daily  . atorvastatin  80 mg Oral q1800  . clopidogrel  75 mg Oral Q breakfast  . fluticasone  2 spray Each Nare Daily  . furosemide  60 mg Oral BID  . insulin aspart  0-5 Units Subcutaneous QHS  . insulin aspart  0-9 Units Subcutaneous TID WC  . isosorbide mononitrate  30 mg Oral Once  . isosorbide mononitrate  30 mg Oral Daily  . mometasone-formoterol  2 puff Inhalation BID  . montelukast  10 mg Oral QHS  . multivitamin with minerals  1 tablet Oral Daily  . mupirocin ointment   Topical Daily  . pantoprazole  40 mg Oral Daily  . sodium chloride flush  3 mL Intravenous Q12H  . tiotropium  18 mcg Inhalation Daily    Continuous Infusions:   LOS: 1 day     Kayleen Memos, MD [facility] Pager [phone]  If 7PM-7AM, please contact night-coverage [email or web address] Password [facility] 12/13/2017, 11:16 AM

## 2017-12-13 NOTE — Consult Note (Addendum)
Cardiology Consultation:   Patient ID: [name]; [id number]; April 21, 1931   Admit date: 12/12/2017 Date of Consult: 12/13/2017  Primary Care Provider: Lawerance Cruel, MD Primary Cardiologist: Dr. Acie Fredrickson Primary Electrophysiologist:  new   Patient Profile:   [name] is a 84 y.o. male with a hx of CAD (CABG 1995, status post DES in June, 2013), COPD, chronic respiratory failure on home O2, CKD (III), HTN, chronic CHF (systolic/diastolic historically), morbid obesity, OSA, hx of GIB, pt reports known hx of nasal/sinus polyps, following with ENT with recurrent surgeries, last 6 mo ago, (Dcan not recall MD name), who is being seen today for the evaluation of syncope, bradycardia at the request of Dr. Georgette Shell.  History of Present Illness:   [name] was out to eat with a friend, they had finished, he was feling well and they were leaving when he suddenly fainted, he states without warning, and only for a few seconds, thinks he woke after hitting the ground.  He did suffer lac to posterior head, and L elbow.  He has been feeling well, no unusual or new symptoms of late, he was wearing his O2, no CP, palpitations.  He woke feeling well also, without residual weakness, took him a few minutes to "gather myself"  He had a similar episode 2 mo ago, al;so out to eat with his friend, though waiting to be seated, and suddenly fainted, though says only a second was waking just as he was hitting the ground, but unable to stop it, denied trauma.  Felt well immediately and proceeded to stay and eat.  Also no associated symptoms.  In-between he reports occasional weak spells that tend to just come and go, sometimes needs to sit until they pass.   LABS K+ 3.9 BUIN/Creat 21/1.79 Trop I: <0.03 poc Trop 0.01 WBC 8.2 H/H 13/42 Plts 180 TSH 2.534  Home meds reviewed: Potential rate limiting meds: Bystolic 10mg  daily (1/2 life 12 hours) Last dose yesterday AM likely about 0730  Past  Medical History:  Diagnosis Date  . Acute blood loss anemia    a. 06/2013 due to GIB.  Marland Kitchen Arthritis    "knees; hands" (07/11/2013)  . Asthma   . CAD (coronary artery disease)    a. s/p CABG x 3 in 1995; b.  NSTEMI 12/2011 -> Cath 6/21: LM 20-30, LAD 90, RI 95, LCX 63m, RCA 90/155m (treated w/ 2.0x16 Promus DES), VG->RCA ok, VG->RI 70-10m (treated w/ 4.0x28 Promus), LIMA->LAD ok, EF 55-60%.  . Chronic diastolic CHF (congestive heart failure) ([place])    a. Echo 10/2011: EF 55-60%;  b. elevated EDP req diuresis.  . Chronic respiratory failure ([place]) 07/11/2013   a. 3L home O2 24/7.  Marland Kitchen CKD (chronic kidney disease)    a. ?Based on Cr 2013 - 1.3-1.7.  . COPD (chronic obstructive pulmonary disease) ([place])    a. on home O2  . GERD (gastroesophageal reflux disease)   . GI bleed    a. 06/2013 adm - prepyloric ulcer, path neg for malignancy. A/w ABL anemia.  Marland Kitchen HLD (hyperlipidemia)   . HOH (hard of hearing)    left ear  . HTN (hypertension)   . Hypothyroidism   . Morbid obesity ([place])   . Orthopnea    a. Has been sleeping in a recliner x 30 yrs.  . Sleep apnea   . Syncope     Past Surgical History:  Procedure Laterality Date  . [place]   "when I was  in the [place]" (07/11/2013)  . CORONARY ANGIOPLASTY WITH STENT PLACEMENT  12/2011  . CORONARY ARTERY BYPASS GRAFT  1995   CABG X3  . ESOPHAGOGASTRODUODENOSCOPY N/A 07/12/2013   Procedure: ESOPHAGOGASTRODUODENOSCOPY (EGD);  Surgeon: Inda Castle, MD;  Location: [facility];  Service: Endoscopy;  Laterality: N/A;  . EXCISIONAL HEMORRHOIDECTOMY     "twice cut them out; burnt them out once"  . FUNCTIONAL ENDOSCOPIC SINUS SURGERY  2006  . HYDROCELE EXCISION  2005  . INGUINAL HERNIA REPAIR Right 2006  . KNEE ARTHROSCOPY Right 1990's  . LEFT HEART CATHETERIZATION WITH CORONARY/GRAFT ANGIOGRAM  01/07/2012   Procedure: LEFT HEART CATHETERIZATION WITH Beatrix Fetters;  Surgeon: Peter M Martinique, MD;  Location: [facility] CATH LAB;  Service:  Cardiovascular;;  . PERCUTANEOUS CORONARY STENT INTERVENTION (PCI-S) N/A 01/10/2012   Procedure: PERCUTANEOUS CORONARY STENT INTERVENTION (PCI-S);  Surgeon: Peter M Martinique, MD;  Location: [facility] CATH LAB;  Service: Cardiovascular;  Laterality: N/A;  . PROSTATE SURGERY  1998       Inpatient Medications: Scheduled Meds: . aspirin EC  81 mg Oral Daily  . atorvastatin  80 mg Oral q1800  . clopidogrel  75 mg Oral Q breakfast  . fluticasone  2 spray Each Nare Daily  . furosemide  60 mg Oral BID  . insulin aspart  0-5 Units Subcutaneous QHS  . insulin aspart  0-9 Units Subcutaneous TID WC  . isosorbide mononitrate  30 mg Oral Once  . isosorbide mononitrate  30 mg Oral Daily  . mometasone-formoterol  2 puff Inhalation BID  . montelukast  10 mg Oral QHS  . multivitamin with minerals  1 tablet Oral Daily  . pantoprazole  40 mg Oral Daily  . sodium chloride flush  3 mL Intravenous Q12H  . tiotropium  18 mcg Inhalation Daily   Continuous Infusions:  PRN Meds: acetaminophen **OR** acetaminophen, albuterol, atropine, hydrALAZINE, nitroGLYCERIN, ondansetron **OR** ondansetron (ZOFRAN) IV, polyethylene glycol, zolpidem  Allergies:    Allergies  Allergen Reactions  . Other Other (See Comments)    Cream cheese-makes very sick    Social History:   Social History   Socioeconomic History  . Marital status: Widowed    Spouse name: Not on file  . Number of children: 3  . Years of education: Not on file  . Highest education level: Not on file  Occupational History  . Occupation: Retired    Comment: Patent examiner  Social Needs  . Financial resource strain: Not on file  . Food insecurity:    Worry: Not on file    Inability: Not on file  . Transportation needs:    Medical: Not on file    Non-medical: Not on file  Tobacco Use  . Smoking status: Never Smoker  . Smokeless tobacco: Never Used  Substance and Sexual Activity  . Alcohol use: Yes    Comment: prior alcohol, no  sig use now  . Drug use: No  . Sexual activity: Never  Lifestyle  . Physical activity:    Days per week: Not on file    Minutes per session: Not on file  . Stress: Not on file  Relationships  . Social connections:    Talks on phone: Not on file    Gets together: Not on file    Attends religious service: Not on file    Active member of club or organization: Not on file    Attends meetings of clubs or organizations: Not on file    Relationship status: Not  on file  . Intimate partner violence:    Fear of current or ex partner: Not on file    Emotionally abused: Not on file    Physically abused: Not on file    Forced sexual activity: Not on file  Other Topics Concern  . Not on file  Social History Narrative   Widower.  Lives with son.      Family History:    Family History  Problem Relation Age of Onset  . CAD Father 70     ROS:  Please see the history of present illness.  All other ROS reviewed and negative.     Physical Exam/Data:   Vitals:   12/13/17 0645 12/13/17 0830 12/13/17 0836 12/13/17 0913  BP: (!) 141/59 (!) 130/59    Pulse: (!) 35 (!) 40 (!) 46   Resp: 19 (!) 25    Temp:      TempSrc:      SpO2: 99% 98% 99% 99%  Weight:      Height:        Intake/Output Summary (Last 24 hours) at 12/13/2017 0915 Last data filed at 12/13/2017 0843 Gross per 24 hour  Intake -  Output 400 ml  Net -400 ml   Filed Weights   12/12/17 2016  Weight: 260 lb (117.9 kg)   Body mass index is 36.26 kg/m.  General:  Chronically ill, dissheveled, in no acute distress, appears his age of 63 HEENT: swelling, laceration (no active bleeding) to posterior head Lymph: no adenopathy Neck: no JVD Endocrine:  No thryomegaly Vascular: No carotid bruits Cardiac:  RRR; 1/6 SM, gallops or rubs Lungs:  CTA b/l, no wheezing, rhonchi or rales  Abd: soft, nontender Ext: no edema Musculoskeletal:  No deformities, L elbow with dressing, dry Skin: warm and dry  Neuro:  No gross focal  abnormalities noted Psych:  Normal affect   EKG:  The EKG was personally reviewed and demonstrates:   SR 82bpm, 1st degree AVBlock, PR 259ms Telemetry:  Telemetry was personally reviewed and demonstrates:   SR, Mobitz one is noted, with brief 2:1 conduction at times, V rates 30's-50's, I do not find any tachycardia, narrow RS  Relevant CV Studies:  09/13/17: TTE Study Conclusions - Procedure narrative: Transthoracic echocardiography. Image   quality was poor. Intravenous contrast (Definity) was   administered. - Left ventricle: The cavity size was normal. Wall thickness was   increased in a pattern of mild LVH. Systolic function was normal.   The estimated ejection fraction was in the range of 55% to 60%.   Wall motion was normal; there were no regional wall motion   abnormalities. Doppler parameters are consistent with abnormal   left ventricular relaxation (grade 1 diastolic dysfunction).   Indeterminate filling pressures. - Aortic valve: Moderately calcified annulus. Trileaflet. - Right ventricle: Systolic function was mildly reduced.  Laboratory Data:  Chemistry Recent Labs  Lab 12/12/17 2028  NA 138  K 3.9  CL 96*  CO2 34*  GLUCOSE 205*  BUN 21*  CREATININE 1.79*  CALCIUM 8.7*  GFRNONAA 33*  GFRAA 38*  ANIONGAP 8    No results for input(s): PROT, ALBUMIN, AST, ALT, ALKPHOS, BILITOT in the last 168 hours. Hematology Recent Labs  Lab 12/12/17 2028  WBC 8.2  RBC 4.48  HGB 13.4  HCT 42.9  MCV 95.8  MCH 29.9  MCHC 31.2  RDW 13.5  PLT 180   Cardiac Enzymes Recent Labs  Lab 12/13/17 0443  TROPONINI <0.03  Recent Labs  Lab 12/12/17 2033  TROPIPOC 0.01    BNPNo results for input(s): BNP, PROBNP in the last 168 hours.  DDimer No results for input(s): DDIMER in the last 168 hours.  Radiology/Studies:   Dg Chest 2 View Result Date: 12/12/2017 CLINICAL DATA:  Syncope. EXAM: CHEST - 2 VIEW COMPARISON:  09/12/2017. FINDINGS: The heart size appears  within normal limits. Previous median sternotomy CABG procedure. Decreased lung volumes. There is no pleural effusion or edema. No airspace opacities identified. IMPRESSION: No active cardiopulmonary disease. Electronically Signed   By: Kerby Moors M.D.   On: 12/12/2017 20:49    Ct Head Wo Contrast Result Date: 12/12/2017 CLINICAL DATA:  Acute onset of syncope and fall. Hit head. Laceration at the back of the head. Concern for cervical spine injury. EXAM: CT HEAD WITHOUT CONTRAST CT CERVICAL SPINE WITHOUT CONTRAST TECHNIQUE: Multidetector CT imaging of the head and cervical spine was performed following the standard protocol without intravenous contrast. Multiplanar CT image reconstructions of the cervical spine were also generated. COMPARISON:  Paranasal sinus CT performed 10/02/2014 FINDINGS: CT HEAD FINDINGS Brain: No evidence of acute infarction, hemorrhage, hydrocephalus, extra-axial collection or mass lesion / mass effect. Prominence of the ventricles and sulci reflects mild cortical volume loss. Scattered periventricular and subcortical white matter change likely reflects small vessel ischemic microangiopathy. The brainstem and fourth ventricle are within normal limits. The basal ganglia are unremarkable in appearance. The cerebral hemispheres demonstrate grossly normal gray-white differentiation. No mass effect or midline shift is seen. Vascular: No hyperdense vessel or unexpected calcification. Skull: There is no evidence of fracture; visualized osseous structures are unremarkable in appearance. Sinuses/Orbits: The visualized portions of the orbits are within normal limits. A polypoid mass is again noted filling the surgically opened right maxillary antrum and right-sided nasal passages, increased in size from the prior study and extending inferiorly into the oropharynx. There is partial opacification of the right frontal sinus. The remaining paranasal sinuses and mastoid air cells are well-aerated.  Other: Soft tissue swelling and laceration are noted at the occiput. CT CERVICAL SPINE FINDINGS Alignment: There is grade 1 anterolisthesis of C4 on C5, reflecting underlying facet disease. Mild degenerative change is noted about the dens. Skull base and vertebrae: No acute fracture. There is mild chronic loss of height at vertebral body C5. No primary bone lesion or focal pathologic process. Soft tissues and spinal canal: No prevertebral fluid or swelling. No visible canal hematoma. Disc levels: Multilevel disc space narrowing is noted along the cervical spine, with underlying anterior and posterior disc osteophyte complexes. Upper chest: The visualized portions of the thyroid gland are unremarkable. The visualized lung apices are clear. Other: No additional soft tissue abnormalities are seen. IMPRESSION: 1. No evidence of traumatic intracranial injury or fracture. 2. No evidence of fracture or subluxation along the cervical spine. 3. Soft tissue swelling and laceration at the occiput. 4. 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 Given extension into the oropharynx, surgical consultation is recommended. Corresponding partial opacification of the right frontal sinus. 5. Mild cortical volume loss and scattered small vessel ischemic microangiopathy. 6. Mild degenerative change noted along the cervical spine. Electronically Signed   By: Garald Balding M.D.   On: 12/12/2017 21:18     Assessment and Plan:   1. Syncope 2. Symptomatic bradycardia, [place] with brief periods of  2:1 conduction     Asymptomatic at rest here in ER     BP is good.     He last took his Bystolic yesterday AM, in review of Up-to-date, 1/2 life is minimum 12 hours     Will need to allow washout of 5 1/2 lives     Patient has pacing  pads on  Dr. Rayann Heman will see later today  3. CAD     No anginal symptoms  4. COPD, chronic resp failure     On home O2 and here  5. Chronic CHF (mixed)     Echo in Feb of this year with LVEF 55-60%, grade I DD     Exam does not suggest fluid OL currently   For questions or updates, please contact Evan Please consult [email or web address] for contact info under Cardiology/STEMI.   Signed, Baldwin Jamaica, PA-C  12/13/2017 9:15 AM  I have seen, examined the patient, and reviewed the above assessment and plan.  Changes to above are made where necessary.  On exam, RRR. Pt admitted with abrupt recurrent syncope.  He has chronic degenerative conduction system disease by EKG and second degree AV block on telemetry.  Will stop beta blocker and observe overnight.  If he continues to have AV conduction abnormality, will require pacing.  IF his AV conduction improves, could likely be discharged tomorrow with 30 day event monitor.  No driving x 6 months with syncope.  EP to follow.  Co Sign: Thompson Grayer, MD 12/13/2017

## 2017-12-13 NOTE — ED Notes (Signed)
Dinner tray at bedside, pt able to feed self

## 2017-12-13 NOTE — ED Notes (Signed)
[place] [phone] RN-paged by Levada Dy

## 2017-12-13 NOTE — ED Notes (Signed)
Pacer Pads placed on pt

## 2017-12-13 NOTE — ED Notes (Signed)
Hospital bed requested from [facility] 

## 2017-12-13 NOTE — ED Notes (Signed)
Spoke with Dr. Nevada Crane this morning, reports patient is NPO due to possible pacemaker insertion today.

## 2017-12-13 NOTE — ED Notes (Signed)
Attempted report x1. RN unavailable and nobody answering phone.

## 2017-12-13 NOTE — Consult Note (Addendum)
Cardiology Consult    Patient ID: [name] MRN: [id number], DOB/AGE: 1930/09/25   Admit date: 12/12/2017 Date of Consult: 12/13/2017  Primary Physician: Lawerance Cruel, MD Primary Cardiologist: No primary care provider on file. Requesting Provider: Dr. Oleta Mouse  Patient Profile    [name] is a 84 y.o. male with a history of CAD s/p CAD and subsequent PCIs, chronic diastolic HF, COPD, HTN, OSA, CKD stage 3, h/o GI bleed, who is being seen today for the evaluation of syncope at the request of Dr. Oleta Mouse.  Past Medical History   Past Medical History:  Diagnosis Date  . Acute blood loss anemia    a. 06/2013 due to GIB.  Marland Kitchen Arthritis    "knees; hands" (07/11/2013)  . Asthma   . CAD (coronary artery disease)    a. s/p CABG x 3 in 1995; b.  NSTEMI 12/2011 -> Cath 6/21: LM 20-30, LAD 90, RI 95, LCX 26m, RCA 90/113m (treated w/ 2.0x16 Promus DES), VG->RCA ok, VG->RI 70-67m (treated w/ 4.0x28 Promus), LIMA->LAD ok, EF 55-60%.  . Chronic diastolic CHF (congestive heart failure) ([place])    a. Echo 10/2011: EF 55-60%;  b. elevated EDP req diuresis.  . Chronic respiratory failure ([place]) 07/11/2013   a. 3L home O2 24/7.  Marland Kitchen CKD (chronic kidney disease)    a. ?Based on Cr 2013 - 1.3-1.7.  . COPD (chronic obstructive pulmonary disease) ([place])    a. on home O2  . GERD (gastroesophageal reflux disease)   . GI bleed    a. 06/2013 adm - prepyloric ulcer, path neg for malignancy. A/w ABL anemia.  Marland Kitchen HLD (hyperlipidemia)   . HOH (hard of hearing)    left ear  . HTN (hypertension)   . Hypothyroidism   . Morbid obesity ([place])   . Orthopnea    a. Has been sleeping in a recliner x 30 yrs.  . Sleep apnea   . Syncope     Past Surgical History:  Procedure Laterality Date  . [place]   "when I was in the [facility]" (07/11/2013)  . CORONARY ANGIOPLASTY WITH STENT PLACEMENT  12/2011  . CORONARY ARTERY BYPASS GRAFT  1995   CABG X3  . ESOPHAGOGASTRODUODENOSCOPY N/A 07/12/2013   Procedure: ESOPHAGOGASTRODUODENOSCOPY (EGD);  Surgeon: Inda Castle, MD;  Location: [facility];  Service: Endoscopy;  Laterality: N/A;  . EXCISIONAL HEMORRHOIDECTOMY     "twice cut them out; burnt them out once"  . FUNCTIONAL ENDOSCOPIC SINUS SURGERY  2006  . HYDROCELE EXCISION  2005  . INGUINAL HERNIA REPAIR Right 2006  . KNEE ARTHROSCOPY Right 1990's  . LEFT HEART CATHETERIZATION WITH CORONARY/GRAFT ANGIOGRAM  01/07/2012   Procedure: LEFT HEART CATHETERIZATION WITH Beatrix Fetters;  Surgeon: Peter M Martinique, MD;  Location: [facility] CATH LAB;  Service: Cardiovascular;;  . PERCUTANEOUS CORONARY STENT INTERVENTION (PCI-S) N/A 01/10/2012   Procedure: PERCUTANEOUS CORONARY STENT INTERVENTION (PCI-S);  Surgeon: Peter M Martinique, MD;  Location: [facility] CATH LAB;  Service: Cardiovascular;  Laterality: N/A;  . PROSTATE SURGERY  1998     Allergies  Allergies  Allergen Reactions  . Other Other (See Comments)    Cream cheese-makes very sick    History of Present Illness    [name] is a 84 y.o. male with a history of CAD s/p CAD and subsequent PCIs, chronic diastolic HF, COPD, HTN, OSA, CKD stage 3, h/o GI bleed, who is being seen today for the evaluation of syncope at the request of Dr.  Liu.  The patient reports that he was in his usual state of health and was out to dinner with a friend. They had finished dinner and he stood to walk out of the restaurant behind his friend and without warning had an episode of syncope with loss of consciousness for approximately 45 seconds. With the syncope, he hit his head and EMS was called. Per report, blood pressure significantly elevated with EMS although improved upon arrival to the ED.   The patient does report prior episodes of syncope with the first 2-3 years ago and most recently before today 2-3 weeks ago. He denies any prodrome with syncope including palpitations, nausea, diaphoresis, light headedness, chest pain or shortness of breath. No recent  changes to medications; bystolic is only AV nodal blocking agent. Is not anticoagulated for afib due to history of GI bleed.   In the ED, HR ranged from 30s-130s although no clear episodes of atrial fibrillation or heart block (unfortunately ECGs of episodes prior to my evaluation were unavailable). Blood pressures stabilized in the 130s/50s. Patient reports no chest pain.   Inpatient Medications    . aspirin EC  81 mg Oral Daily  . atorvastatin  80 mg Oral q1800  . clopidogrel  75 mg Oral Q breakfast  . fluticasone  2 spray Each Nare Daily  . furosemide  60 mg Oral BID  . insulin aspart  0-5 Units Subcutaneous QHS  . insulin aspart  0-9 Units Subcutaneous TID WC  . isosorbide mononitrate  30 mg Oral Once  . isosorbide mononitrate  30 mg Oral Daily  . mometasone-formoterol  2 puff Inhalation BID  . montelukast  10 mg Oral QHS  . multivitamin with minerals  1 tablet Oral Daily  . pantoprazole  40 mg Oral Daily  . sodium chloride flush  3 mL Intravenous Q12H  . tiotropium  18 mcg Inhalation Daily    Family History    Family History  Problem Relation Age of Onset  . CAD Father 38   indicated that his mother is deceased. He indicated that his father is deceased.   Social History    Social History   Socioeconomic History  . Marital status: Widowed    Spouse name: Not on file  . Number of children: 3  . Years of education: Not on file  . Highest education level: Not on file  Occupational History  . Occupation: Retired    Comment: Patent examiner  Social Needs  . Financial resource strain: Not on file  . Food insecurity:    Worry: Not on file    Inability: Not on file  . Transportation needs:    Medical: Not on file    Non-medical: Not on file  Tobacco Use  . Smoking status: Never Smoker  . Smokeless tobacco: Never Used  Substance and Sexual Activity  . Alcohol use: Yes    Comment: prior alcohol, no sig use now  . Drug use: No  . Sexual activity:  Never  Lifestyle  . Physical activity:    Days per week: Not on file    Minutes per session: Not on file  . Stress: Not on file  Relationships  . Social connections:    Talks on phone: Not on file    Gets together: Not on file    Attends religious service: Not on file    Active member of club or organization: Not on file    Attends meetings of clubs or organizations: Not on  file    Relationship status: Not on file  . Intimate partner violence:    Fear of current or ex partner: Not on file    Emotionally abused: Not on file    Physically abused: Not on file    Forced sexual activity: Not on file  Other Topics Concern  . Not on file  Social History Narrative   Widower.  Lives with son.       Review of Systems    General:  No chills, fever, night sweats or weight changes.  Cardiovascular:  No chest pain, dyspnea on exertion, edema, orthopnea, palpitations, paroxysmal nocturnal dyspnea. Dermatological: No rash, lesions/masses Respiratory: No cough, dyspnea Urologic: No hematuria, dysuria Abdominal:   No nausea, vomiting, diarrhea, bright red blood per rectum, melena, or hematemesis Neurologic:  No visual changes, wkns, changes in mental status. All other systems reviewed and are otherwise negative except as noted above.  Physical Exam    Blood pressure (!) 136/58, pulse (!) 41, temperature 98 F (36.7 C), temperature source Oral, resp. rate 20, height 5\' 11"  (1.803 m), weight 117.9 kg (260 lb), SpO2 98 %.  General: Pleasant, NAD Psych: Normal affect. Neuro: Alert and oriented X 3. Moves all extremities spontaneously. HEENT: Back of head with large laceration. Bleeding from right nostril.   Neck: Supple without bruits or JVD. Lungs:  Resp regular and unlabored, CTA. Heart: Bradycardic. Regular rhythm. no s3, s4, or murmurs. Abdomen: Soft, non-tender, non-distended, BS + x 4.  Extremities: No clubbing, cyanosis. 1+ edema, equal bilaterally. DP/PT/Radials 2+ and equal  bilaterally.  Labs    Troponin [facility] of Care Test) Recent Labs    12/12/17 2033  TROPIPOC 0.01   No results for input(s): CKTOTAL, CKMB, TROPONINI in the last 72 hours. Lab Results  Component Value Date   WBC 8.2 12/12/2017   HGB 13.4 12/12/2017   HCT 42.9 12/12/2017   MCV 95.8 12/12/2017   PLT 180 12/12/2017    Recent Labs  Lab 12/12/17 2028  NA 138  K 3.9  CL 96*  CO2 34*  BUN 21*  CREATININE 1.79*  CALCIUM 8.7*  GLUCOSE 205*   No results found for: CHOL, HDL, LDLCALC, TRIG Lab Results  Component Value Date   DDIMER 0.30 01/06/2012     Radiology Studies    Dg Chest 2 View  Result Date: 12/12/2017 CLINICAL DATA:  Syncope. EXAM: CHEST - 2 VIEW COMPARISON:  09/12/2017. FINDINGS: The heart size appears within normal limits. Previous median sternotomy CABG procedure. Decreased lung volumes. There is no pleural effusion or edema. No airspace opacities identified. IMPRESSION: No active cardiopulmonary disease. Electronically Signed   By: Kerby Moors M.D.   On: 12/12/2017 20:49   Ct Head Wo Contrast  Result Date: 12/12/2017 CLINICAL DATA:  Acute onset of syncope and fall. Hit head. Laceration at the back of the head. Concern for cervical spine injury. EXAM: CT HEAD WITHOUT CONTRAST CT CERVICAL SPINE WITHOUT CONTRAST TECHNIQUE: Multidetector CT imaging of the head and cervical spine was performed following the standard protocol without intravenous contrast. Multiplanar CT image reconstructions of the cervical spine were also generated. COMPARISON:  Paranasal sinus CT performed 10/02/2014 FINDINGS: CT HEAD FINDINGS Brain: No evidence of acute infarction, hemorrhage, hydrocephalus, extra-axial collection or mass lesion / mass effect. Prominence of the ventricles and sulci reflects mild cortical volume loss. Scattered periventricular and subcortical white matter change likely reflects small vessel ischemic microangiopathy. The brainstem and fourth ventricle are within normal  limits. The basal ganglia  are unremarkable in appearance. The cerebral hemispheres demonstrate grossly normal gray-white differentiation. No mass effect or midline shift is seen. Vascular: No hyperdense vessel or unexpected calcification. Skull: There is no evidence of fracture; visualized osseous structures are unremarkable in appearance. Sinuses/Orbits: The visualized portions of the orbits are within normal limits. A polypoid mass is again noted filling the surgically opened right maxillary antrum and right-sided nasal passages, increased in size from the prior study and extending inferiorly into the oropharynx. There is partial opacification of the right frontal sinus. The remaining paranasal sinuses and mastoid air cells are well-aerated. Other: Soft tissue swelling and laceration are noted at the occiput. CT CERVICAL SPINE FINDINGS Alignment: There is grade 1 anterolisthesis of C4 on C5, reflecting underlying facet disease. Mild degenerative change is noted about the dens. Skull base and vertebrae: No acute fracture. There is mild chronic loss of height at vertebral body C5. No primary bone lesion or focal pathologic process. Soft tissues and spinal canal: No prevertebral fluid or swelling. No visible canal hematoma. Disc levels: Multilevel disc space narrowing is noted along the cervical spine, with underlying anterior and posterior disc osteophyte complexes. Upper chest: The visualized portions of the thyroid gland are unremarkable. The visualized lung apices are clear. Other: No additional soft tissue abnormalities are seen. IMPRESSION: 1. No evidence of traumatic intracranial injury or fracture. 2. No evidence of fracture or subluxation along the cervical spine. 3. Soft tissue swelling and laceration at the occiput. 4. 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 This now extends inferiorly into the oropharynx, and is compatible with recurrence  of the patient's previously noted right maxillary mass. Given extension into the oropharynx, surgical consultation is recommended. Corresponding partial opacification of the right frontal sinus. 5. Mild cortical volume loss and scattered small vessel ischemic microangiopathy. 6. Mild degenerative change noted along the cervical spine. Electronically Signed   By: Garald Balding M.D.   On: 12/12/2017 21:18   Ct Cervical Spine Wo Contrast  Result Date: 12/12/2017 CLINICAL DATA:  Acute onset of syncope and fall. Hit head. Laceration at the back of the head. Concern for cervical spine injury. EXAM: CT HEAD WITHOUT CONTRAST CT CERVICAL SPINE WITHOUT CONTRAST TECHNIQUE: Multidetector CT imaging of the head and cervical spine was performed following the standard protocol without intravenous contrast. Multiplanar CT image reconstructions of the cervical spine were also generated. COMPARISON:  Paranasal sinus CT performed 10/02/2014 FINDINGS: CT HEAD FINDINGS Brain: No evidence of acute infarction, hemorrhage, hydrocephalus, extra-axial collection or mass lesion / mass effect. Prominence of the ventricles and sulci reflects mild cortical volume loss. Scattered periventricular and subcortical white matter change likely reflects small vessel ischemic microangiopathy. The brainstem and fourth ventricle are within normal limits. The basal ganglia are unremarkable in appearance. The cerebral hemispheres demonstrate grossly normal gray-white differentiation. No mass effect or midline shift is seen. Vascular: No hyperdense vessel or unexpected calcification. Skull: There is no evidence of fracture; visualized osseous structures are unremarkable in appearance. Sinuses/Orbits: The visualized portions of the orbits are within normal limits. A polypoid mass is again noted filling the surgically opened right maxillary antrum and right-sided nasal passages, increased in size from the prior study and extending inferiorly into the  oropharynx. There is partial opacification of the right frontal sinus. The remaining paranasal sinuses and mastoid air cells are well-aerated. Other: Soft tissue swelling and laceration are noted at the occiput. CT CERVICAL SPINE FINDINGS Alignment: There is grade 1 anterolisthesis  of C4 on C5, reflecting underlying facet disease. Mild degenerative change is noted about the dens. Skull base and vertebrae: No acute fracture. There is mild chronic loss of height at vertebral body C5. No primary bone lesion or focal pathologic process. Soft tissues and spinal canal: No prevertebral fluid or swelling. No visible canal hematoma. Disc levels: Multilevel disc space narrowing is noted along the cervical spine, with underlying anterior and posterior disc osteophyte complexes. Upper chest: The visualized portions of the thyroid gland are unremarkable. The visualized lung apices are clear. Other: No additional soft tissue abnormalities are seen. IMPRESSION: 1. No evidence of traumatic intracranial injury or fracture. 2. No evidence of fracture or subluxation along the cervical spine. 3. Soft tissue swelling and laceration at the occiput. 4. 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 Given extension into the oropharynx, surgical consultation is recommended. Corresponding partial opacification of the right frontal sinus. 5. Mild cortical volume loss and scattered small vessel ischemic microangiopathy. 6. Mild degenerative change noted along the cervical spine. Electronically Signed   By: Garald Balding M.D.   On: 12/12/2017 21:18    ECG & Cardiac Imaging    ECG on arrival to ED: SR, first degree heart block. No acute ischemic changes.   Assessment & Plan    # Syncope Concern for tachy-brady syndrome with episodes of  both observed in ED. Patient with HR frequently dipping to the 30s, on monitor appears to be consistent with sinus bradycardia without clear high grade heart block or dropped beats. Patient does have a history of CAD but troponin is not elevated and no symptoms to suggest an ischemic etiology of new bradycardia. Differential also includes ventricular arrhythmia given lack of prodromal symptoms, although no arrhythmia observed since presentation to the hospital. - Hold home bystolic - pacing pads, atropine at bedside - Would trend troponin x3 - TTE in AM to evaluate cardiac function - Given frequent bradycardia following this episode of syncope, concern for need for ppm placement. Will ask EP to evaluate in the AM - Please keep patient NPO at midnight pending PPM evaluation.  - Patient is currently hemodynamically stable without evidence of heart block and with elevated blood pressures. If any change to status, would recommend immediate transfer to CCU for increased level of monitoring and potentially initiation of dobutamine versus placement of transvenous pacer if needed.   # Chronic diastolic heart failure Mild LEE. Appears compensated otherwise. - Cont home lasix  # CAD - Cont home ASA, plavix, atorvastatin, imdur  Signed, Bryna Colander, MD 12/13/2017, 12:18 AM  For questions or updates, please contact   Please consult [email or web address] for contact info under Cardiology/STEMI.

## 2017-12-13 NOTE — ED Notes (Signed)
Pt was trying to get up out of bed by himself, Pt was assisted in using the urinal and helped back into the bed. Pt call light is now in reach and pt is resting comfortable.

## 2017-12-14 ENCOUNTER — Inpatient Hospital Stay (HOSPITAL_COMMUNITY)
Admission: EM | Disposition: A | Discharge status: Home Health | Payer: Self-pay | Source: Home / Self Care | Attending: Internal Medicine

## 2017-12-14 ENCOUNTER — Inpatient Hospital Stay (HOSPITAL_COMMUNITY): Payer: Medicare Other

## 2017-12-14 DIAGNOSIS — J431 Panlobular emphysema: Secondary | ICD-10-CM

## 2017-12-14 DIAGNOSIS — I5030 Unspecified diastolic (congestive) heart failure: Secondary | ICD-10-CM

## 2017-12-14 DIAGNOSIS — R220 Localized swelling, mass and lump, head: Secondary | ICD-10-CM

## 2017-12-14 DIAGNOSIS — I5032 Chronic diastolic (congestive) heart failure: Secondary | ICD-10-CM

## 2017-12-14 DIAGNOSIS — I482 Chronic atrial fibrillation: Secondary | ICD-10-CM

## 2017-12-14 DIAGNOSIS — I495 Sick sinus syndrome: Principal | ICD-10-CM

## 2017-12-14 HISTORY — PX: PACEMAKER IMPLANT: EP1218

## 2017-12-14 LAB — CBC
HEMATOCRIT: 40.6 % (ref 39.0–52.0)
HEMOGLOBIN: 12.7 g/dL — AB (ref 13.0–17.0)
MCH: 29.5 pg (ref 26.0–34.0)
MCHC: 31.3 g/dL (ref 30.0–36.0)
MCV: 94.4 fL (ref 78.0–100.0)
Platelets: 168 10*3/uL (ref 150–400)
RBC: 4.3 MIL/uL (ref 4.22–5.81)
RDW: 13.5 % (ref 11.5–15.5)
WBC: 7.9 10*3/uL (ref 4.0–10.5)

## 2017-12-14 LAB — BASIC METABOLIC PANEL
ANION GAP: 7 (ref 5–15)
BUN: 21 mg/dL — ABNORMAL HIGH (ref 6–20)
CALCIUM: 8.6 mg/dL — AB (ref 8.9–10.3)
CO2: 36 mmol/L — AB (ref 22–32)
Chloride: 98 mmol/L — ABNORMAL LOW (ref 101–111)
Creatinine, Ser: 1.62 mg/dL — ABNORMAL HIGH (ref 0.61–1.24)
GFR calc non Af Amer: 37 mL/min — ABNORMAL LOW (ref >60)
GFR, EST AFRICAN AMERICAN: 43 mL/min — AB (ref >60)
Glucose, Bld: 103 mg/dL — ABNORMAL HIGH (ref 65–99)
Potassium: 3.7 mmol/L (ref 3.5–5.1)
Sodium: 141 mmol/L (ref 135–145)

## 2017-12-14 LAB — ECHOCARDIOGRAM COMPLETE
HEIGHTINCHES: 71 in
WEIGHTICAEL: 4304 [oz_av]

## 2017-12-14 LAB — GLUCOSE, CAPILLARY
GLUCOSE-CAPILLARY: 106 mg/dL — AB (ref 65–99)
GLUCOSE-CAPILLARY: 95 mg/dL (ref 65–99)
GLUCOSE-CAPILLARY: 98 mg/dL (ref 65–99)
Glucose-Capillary: 96 mg/dL (ref 65–99)
Glucose-Capillary: 79 mg/dL (ref 65–99)
Glucose-Capillary: 200 mg/dL — ABNORMAL HIGH (ref 65–99)

## 2017-12-14 LAB — MRSA PCR SCREENING: MRSA BY PCR: NEGATIVE

## 2017-12-14 LAB — SURGICAL PCR SCREEN
MRSA, PCR: NEGATIVE
Staphylococcus aureus: NEGATIVE

## 2017-12-14 SURGERY — PACEMAKER IMPLANT

## 2017-12-14 MED ORDER — LIDOCAINE HCL (PF) 1 % IJ SOLN
INTRAMUSCULAR | Status: DC | PRN
  Administered 2017-12-14: 60 mL

## 2017-12-14 MED ORDER — HEPARIN (PORCINE) IN NACL 2-0.9 UNITS/ML
INTRAMUSCULAR | Status: AC | PRN
  Administered 2017-12-14: 500 mL

## 2017-12-14 MED ORDER — DEXTROSE 5 % IV SOLN
3.0000 g | INTRAVENOUS | Status: AC
  Administered 2017-12-14: 3 g via INTRAVENOUS
  Filled 2017-12-14 (×2): qty 3000

## 2017-12-14 MED ORDER — CHLORHEXIDINE GLUCONATE 4 % EX LIQD: 60.0000 mL | Freq: Once | CUTANEOUS | Status: DC

## 2017-12-14 MED ORDER — SODIUM CHLORIDE 0.9 % IV SOLN
INTRAVENOUS | Status: AC
  Filled 2017-12-14: qty 2

## 2017-12-14 MED ORDER — SODIUM CHLORIDE 0.9 % IV SOLN: 250.0000 mL | INTRAVENOUS | Status: DC

## 2017-12-14 MED ORDER — ONDANSETRON HCL 4 MG/2ML IJ SOLN: 4.0000 mg | Freq: Four times a day (QID) | INTRAMUSCULAR | Status: DC | PRN

## 2017-12-14 MED ORDER — ACETAMINOPHEN 325 MG PO TABS: 325.0000 mg | ORAL_TABLET | ORAL | Status: DC | PRN

## 2017-12-14 MED ORDER — HEPARIN (PORCINE) IN NACL 1000-0.9 UT/500ML-% IV SOLN
INTRAVENOUS | Status: AC
  Filled 2017-12-14: qty 500

## 2017-12-14 MED ORDER — SODIUM CHLORIDE 0.9 % IV SOLN
80.0000 mg | INTRAVENOUS | Status: AC
  Administered 2017-12-14: 80 mg
  Filled 2017-12-14: qty 2

## 2017-12-14 MED ORDER — SODIUM CHLORIDE 0.9% FLUSH: 3.0000 mL | INTRAVENOUS | Status: DC | PRN

## 2017-12-14 MED ORDER — LIDOCAINE HCL 1 % IJ SOLN
INTRAMUSCULAR | Status: AC
  Filled 2017-12-14: qty 20

## 2017-12-14 MED ORDER — SODIUM CHLORIDE 0.9 % IV SOLN
INTRAVENOUS | Status: DC
  Administered 2017-12-14: 11:00:00 via INTRAVENOUS

## 2017-12-14 MED ORDER — SODIUM CHLORIDE 0.9% FLUSH
3.0000 mL | Freq: Two times a day (BID) | INTRAVENOUS | Status: DC
  Administered 2017-12-14: 3 mL via INTRAVENOUS

## 2017-12-14 MED ORDER — PERFLUTREN LIPID MICROSPHERE
1.0000 mL | INTRAVENOUS | Status: AC | PRN
  Administered 2017-12-14: 2 mL via INTRAVENOUS
  Filled 2017-12-14: qty 10

## 2017-12-14 MED ORDER — CEFAZOLIN SODIUM-DEXTROSE 1-4 GM/50ML-% IV SOLN
1.0000 g | Freq: Four times a day (QID) | INTRAVENOUS | Status: AC
  Administered 2017-12-14 – 2017-12-15 (×3): 1 g via INTRAVENOUS
  Filled 2017-12-14 (×3): qty 50

## 2017-12-14 MED ORDER — LIDOCAINE HCL (PF) 1 % IJ SOLN
INTRAMUSCULAR | Status: AC
  Filled 2017-12-14: qty 30

## 2017-12-14 MED ORDER — LIDOCAINE HCL 1 % IJ SOLN
INTRAMUSCULAR | Status: AC
  Filled 2017-12-14: qty 60

## 2017-12-14 SURGICAL SUPPLY — 8 items
CABLE SURGICAL S-101-97-12 (CABLE) ×2 IMPLANT
IPG PACE AZUR XT DR MRI W1DR01 (Pacemaker) IMPLANT
LEAD CAPSURE NOVUS 5076-52CM (Lead) ×2 IMPLANT
LEAD CAPSURE NOVUS 5076-58CM (Lead) ×2 IMPLANT
PACE AZURE XT DR MRI W1DR01 (Pacemaker) ×3 IMPLANT
PAD DEFIB LIFELINK (PAD) ×2 IMPLANT
SHEATH CLASSIC 7F (SHEATH) ×4 IMPLANT
TRAY PACEMAKER INSERTION (PACKS) ×2 IMPLANT

## 2017-12-14 NOTE — Progress Notes (Signed)
PROGRESS NOTE    [name]  ELF:[id number] DOB: May 10, 1931 DOA: 12/12/2017 PCP: Lawerance Cruel, MD   Brief Narrative:  84 y.o. WM PMHx Chronic Diastolic, CABG, CAD with stent placement, Atrial Fibrillation not on anticoagulant COPD, HLD, Diabetes type 2, GERD,  CKD Stage 3, GI bleeding, Mobid Obesity, benign sinus mass status post resection,   Per report, pt "blacked out" for about 45 seconds after eating dinner when he stood up and walked outside.  Patient denies any chest pain, shortness breath or palpitation.  no unilateral weakness, numbness or tingling his extremities, no facial droop, slurred speech.  Patient denies cough, fever, chills, nausea, vomiting, diarrhea, abdominal pain.  No symptoms of UTI.  Pt had elevated blood pressure with SBP 220s, which has improved to 97/67 in ED.  Patient was found to have tachy-bradycardia with heart rate 30-130s in ED. Pt had occipital scalp laceration.   ED Course: pt was found to have negative troponin, WBC 8.2, negative urinalysis, stable renal function, temperature normal, oxygen saturation 88% on room air, respiration rate 16.  Patient is admitted to stepdown as inpatient.  Cardiology, Dr.Lowenster was consulted   12/13/2017: Patient seen and examined at his bedside.  He has no new complaints.  He denies any chest pain, palpitations or dyspnea.      Subjective: 5/29 A/O x4.  Negative CP, negative acute S OB, negative abdominal pain.  States this is his third time of positive LOC.    Assessment & Plan:   Principal Problem:   Syncope Active Problems:   Type II diabetes mellitus with renal manifestations (HCC)   Chronic diastolic CHF (congestive heart failure) (HCC)   CAD (coronary artery disease)   HLD (hyperlipidemia)   COPD (chronic obstructive pulmonary disease) (HCC)   CKD (chronic kidney disease), stage III (HCC)   Atrial fibrillation (HCC)   History of recent fall   Maxillary sinus mass   GERD (gastroesophageal reflux  disease)   Fall  Syncope and collapse - Most likely secondary cardiogenic: Tachybradycardia syndrome.  Per patient this is his third episode. -Has been evaluated by EP and will receive pacer on 5/29 - Upon admission CT had negative evidence of acute intracranial abnormality.  Some soft tissue swelling and laceration of the occipital.  Right maxillary mass.  Tachycardia-bradycardia syndrome -Cardiology following -EP following - To receive pacer 5/85 - See diastolic CHF  Chronic atrial fibrillation -Not on anticoagulation, secondary to multiple falls  Chronic Diastolic CHF -Strict in and out -Daily weight Transfuse for hemoglobin<8 - Previous echocardiogram (09/13/2017): EF= 55 to 27%, grade 1 diastolic dysfunction -Bystolic (hold) -Imdur 30 mg daily -Lasix 60 mg BID  CAD -S/P CABG -ASA 81 mg daily -Plavix 75 mg daily  .  HLD -Lipitor 80 mg daily  COPD --Titrate O2 to maintain SPO2 89 to 93% - Albuterol nebulizer - Flonase - Spiriva daily   GERD Stable Continue Protonix  Right maxillary mass/history of sinus mass -Noted on CT head 5/27 -Follow-up as outpatient ENT     DVT prophylaxis: SCD Code Status: Full Family Communication: None Disposition Plan: Per cardiology   Consultants:  Cardiology/EP    Procedures/Significant Events:  CT--head and C spin showed:- Negative traumatic intracranial injury or fracture. -Negative fracture or subluxation along spine. -Positive.swelling and laceration at the occiput. -Enlarging polypoid mass filling the surgically open RIGHT maxillary antrum and RIGHT sided nasal passages. This now extends inferiorly into the oropharynx, and is compatible with recurrence of the patient's previously noted right maxillary  mass. Given extension into the oropharynx, surgical consultation is recommended. Corresponding partial opacification of the right frontal sinus.    I have personally reviewed and interpreted all radiology studies  and my findings are as above.  VENTILATOR SETTINGS:   Cultures   Antimicrobials: Anti-infectives (From admission, onward)   Start     Stop   12/14/17 1900  ceFAZolin (ANCEF) IVPB 1 g/50 mL premix     12/15/17 1259   12/14/17 1400  gentamicin (GARAMYCIN) 80 mg in sodium chloride 0.9 % 500 mL irrigation     12/14/17 1529   12/14/17 1400  ceFAZolin (ANCEF) 3 g in dextrose 5 % 50 mL IVPB     12/14/17 1527       Devices Medtronic Azure XT DR MRI SureScan dual-chamber pacemaker  5/29>>>>    LINES / TUBES:      Continuous Infusions:   Objective: Vitals:   12/14/17 0008 12/14/17 0319 12/14/17 0429 12/14/17 0757  BP: (!) 121/52 130/66  139/70  Pulse: 62 65  63  Resp: 19 18  14   Temp: 98.4 F (36.9 C) 98.1 F (36.7 C)  98 F (36.7 C)  TempSrc: Oral Oral  Oral  SpO2: 95% 98%  98%  Weight:   269 lb (122 kg)   Height:        Intake/Output Summary (Last 24 hours) at 12/14/2017 0824 Last data filed at 12/14/2017 0701 Gross per 24 hour  Intake -  Output 1500 ml  Net -1500 ml   Filed Weights   12/12/17 2016 12/13/17 2255 12/14/17 0429  Weight: 260 lb (117.9 kg) 267 lb (121.1 kg) 269 lb (122 kg)    Examination:  General: A/O x4, No acute respiratory distress, positive chronic respiratory distress. Neck:  Negative scars, masses, torticollis, lymphadenopathy, JVD Lungs: Clear to auscultation bilaterally without wheezes or crackles Cardiovascular: Irregularly irregular rhythm and rate, wihout murmur gallop or rub normal S1 and S2 Abdomen: negative abdominal pain, nondistended, positive soft, bowel sounds, no rebound, no ascites, no appreciable mass Extremities: No significant cyanosis, clubbing, or edema bilateral lower extremities Skin: Negative rashes, lesions, ulcers Psychiatric:  Negative depression, negative anxiety, negative fatigue, negative mania  Central nervous system:  Cranial nerves II through XII intact, tongue/uvula midline, all extremities muscle  strength 5/5, sensation intact throughout,  negative dysarthria, negative expressive aphasia, negative receptive aphasia.  .     Data Reviewed: Care during the described time interval was provided by me .  I have reviewed this patient's available data, including medical history, events of note, physical examination, and all test results as part of my evaluation.   CBC: Recent Labs  Lab 12/12/17 2028 12/14/17 0409  WBC 8.2 7.9  HGB 13.4 12.7*  HCT 42.9 40.6  MCV 95.8 94.4  PLT 180 161   Basic Metabolic Panel: Recent Labs  Lab 12/12/17 2028 12/14/17 0409  NA 138 141  K 3.9 3.7  CL 96* 98*  CO2 34* 36*  GLUCOSE 205* 103*  BUN 21* 21*  CREATININE 1.79* 1.62*  CALCIUM 8.7* 8.6*   GFR: Estimated Creatinine Clearance: 44.3 mL/min (A) (by C-G formula based on SCr of 1.62 mg/dL (H)). Liver Function Tests: No results for input(s): AST, ALT, ALKPHOS, BILITOT, PROT, ALBUMIN in the last 168 hours. No results for input(s): LIPASE, AMYLASE in the last 168 hours. No results for input(s): AMMONIA in the last 168 hours. Coagulation Profile: No results for input(s): INR, PROTIME in the last 168 hours. Cardiac Enzymes: Recent Labs  Lab 12/13/17 0443 12/13/17 1335  TROPONINI <0.03 <0.03   BNP (last 3 results) No results for input(s): PROBNP in the last 8760 hours. HbA1C: Recent Labs    12/13/17 0442  HGBA1C 5.8*   CBG: Recent Labs  Lab 12/13/17 1224 12/13/17 1638 12/13/17 2016 12/14/17 0557 12/14/17 0801  GLUCAP 78 94 184* 106* 95   Lipid Profile: Recent Labs    12/13/17 0442  CHOL 118  HDL 41  LDLCALC 61  TRIG 82  CHOLHDL 2.9   Thyroid Function Tests: Recent Labs    12/13/17 0442  TSH 2.534   Anemia Panel: No results for input(s): VITAMINB12, FOLATE, FERRITIN, TIBC, IRON, RETICCTPCT in the last 72 hours. Urine analysis:    Component Value Date/Time   COLORURINE YELLOW 12/12/2017 2031   APPEARANCEUR CLEAR 12/12/2017 2031   LABSPEC 1.012 12/12/2017  2031   PHURINE 7.0 12/12/2017 2031   GLUCOSEU 50 (A) 12/12/2017 2031   HGBUR NEGATIVE 12/12/2017 2031   BILIRUBINUR NEGATIVE 12/12/2017 2031   KETONESUR NEGATIVE 12/12/2017 2031   PROTEINUR NEGATIVE 12/12/2017 2031   UROBILINOGEN 0.2 10/05/2014 1300   NITRITE NEGATIVE 12/12/2017 2031   LEUKOCYTESUR NEGATIVE 12/12/2017 2031   Sepsis Labs: @LABRCNTIP (procalcitonin:4,lacticidven:4)  ) Recent Results (from the past 240 hour(s))  MRSA PCR Screening     Status: None   Collection Time: 12/14/17 12:06 AM  Result Value Ref Range Status   MRSA by PCR NEGATIVE NEGATIVE Final    Comment:        The GeneXpert MRSA Assay (FDA approved for NASAL specimens only), is one component of a comprehensive MRSA colonization surveillance program. It is not intended to diagnose MRSA infection nor to guide or monitor treatment for MRSA infections. Performed at [facility], [place] [address], [place], [place] [address]          Radiology Studies: Dg Chest 2 View  Result Date: 12/12/2017 CLINICAL DATA:  Syncope. EXAM: CHEST - 2 VIEW COMPARISON:  09/12/2017. FINDINGS: The heart size appears within normal limits. Previous median sternotomy CABG procedure. Decreased lung volumes. There is no pleural effusion or edema. No airspace opacities identified. IMPRESSION: No active cardiopulmonary disease. Electronically Signed   By: Kerby Moors M.D.   On: 12/12/2017 20:49   Ct Head Wo Contrast  Result Date: 12/12/2017 CLINICAL DATA:  Acute onset of syncope and fall. Hit head. Laceration at the back of the head. Concern for cervical spine injury. EXAM: CT HEAD WITHOUT CONTRAST CT CERVICAL SPINE WITHOUT CONTRAST TECHNIQUE: Multidetector CT imaging of the head and cervical spine was performed following the standard protocol without intravenous contrast. Multiplanar CT image reconstructions of the cervical spine were also generated. COMPARISON:  Paranasal sinus CT performed 10/02/2014 FINDINGS: CT HEAD  FINDINGS Brain: No evidence of acute infarction, hemorrhage, hydrocephalus, extra-axial collection or mass lesion / mass effect. Prominence of the ventricles and sulci reflects mild cortical volume loss. Scattered periventricular and subcortical white matter change likely reflects small vessel ischemic microangiopathy. The brainstem and fourth ventricle are within normal limits. The basal ganglia are unremarkable in appearance. The cerebral hemispheres demonstrate grossly normal gray-white differentiation. No mass effect or midline shift is seen. Vascular: No hyperdense vessel or unexpected calcification. Skull: There is no evidence of fracture; visualized osseous structures are unremarkable in appearance. Sinuses/Orbits: The visualized portions of the orbits are within normal limits. A polypoid mass is again noted filling the surgically opened right maxillary antrum and right-sided nasal passages, increased in size from the prior study and extending  inferiorly into the oropharynx. There is partial opacification of the right frontal sinus. The remaining paranasal sinuses and mastoid air cells are well-aerated. Other: Soft tissue swelling and laceration are noted at the occiput. CT CERVICAL SPINE FINDINGS Alignment: There is grade 1 anterolisthesis of C4 on C5, reflecting underlying facet disease. Mild degenerative change is noted about the dens. Skull base and vertebrae: No acute fracture. There is mild chronic loss of height at vertebral body C5. No primary bone lesion or focal pathologic process. Soft tissues and spinal canal: No prevertebral fluid or swelling. No visible canal hematoma. Disc levels: Multilevel disc space narrowing is noted along the cervical spine, with underlying anterior and posterior disc osteophyte complexes. Upper chest: The visualized portions of the thyroid gland are unremarkable. The visualized lung apices are clear. Other: No additional soft tissue abnormalities are seen. IMPRESSION: 1.  No evidence of traumatic intracranial injury or fracture. 2. No evidence of fracture or subluxation along the cervical spine. 3. Soft tissue swelling and laceration at the occiput. 4. 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 Given extension into the oropharynx, surgical consultation is recommended. Corresponding partial opacification of the right frontal sinus. 5. Mild cortical volume loss and scattered small vessel ischemic microangiopathy. 6. Mild degenerative change noted along the cervical spine. Electronically Signed   By: Garald Balding M.D.   On: 12/12/2017 21:18   Ct Cervical Spine Wo Contrast  Result Date: 12/12/2017 CLINICAL DATA:  Acute onset of syncope and fall. Hit head. Laceration at the back of the head. Concern for cervical spine injury. EXAM: CT HEAD WITHOUT CONTRAST CT CERVICAL SPINE WITHOUT CONTRAST TECHNIQUE: Multidetector CT imaging of the head and cervical spine was performed following the standard protocol without intravenous contrast. Multiplanar CT image reconstructions of the cervical spine were also generated. COMPARISON:  Paranasal sinus CT performed 10/02/2014 FINDINGS: CT HEAD FINDINGS Brain: No evidence of acute infarction, hemorrhage, hydrocephalus, extra-axial collection or mass lesion / mass effect. Prominence of the ventricles and sulci reflects mild cortical volume loss. Scattered periventricular and subcortical white matter change likely reflects small vessel ischemic microangiopathy. The brainstem and fourth ventricle are within normal limits. The basal ganglia are unremarkable in appearance. The cerebral hemispheres demonstrate grossly normal gray-white differentiation. No mass effect or midline shift is seen. Vascular: No hyperdense vessel or unexpected calcification.  Skull: There is no evidence of fracture; visualized osseous structures are unremarkable in appearance. Sinuses/Orbits: The visualized portions of the orbits are within normal limits. A polypoid mass is again noted filling the surgically opened right maxillary antrum and right-sided nasal passages, increased in size from the prior study and extending inferiorly into the oropharynx. There is partial opacification of the right frontal sinus. The remaining paranasal sinuses and mastoid air cells are well-aerated. Other: Soft tissue swelling and laceration are noted at the occiput. CT CERVICAL SPINE FINDINGS Alignment: There is grade 1 anterolisthesis of C4 on C5, reflecting underlying facet disease. Mild degenerative change is noted about the dens. Skull base and vertebrae: No acute fracture. There is mild chronic loss of height at vertebral body C5. No primary bone lesion or focal pathologic process. Soft tissues and spinal canal: No prevertebral fluid or swelling. No visible canal hematoma. Disc levels: Multilevel disc space narrowing is noted along the cervical spine, with underlying anterior and posterior disc osteophyte complexes. Upper chest:  The visualized portions of the thyroid gland are unremarkable. The visualized lung apices are clear. Other: No additional soft tissue abnormalities are seen. IMPRESSION: 1. No evidence of traumatic intracranial injury or fracture. 2. No evidence of fracture or subluxation along the cervical spine. 3. Soft tissue swelling and laceration at the occiput. 4. 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 Given extension into the oropharynx, surgical consultation is recommended. Corresponding partial opacification of the right frontal sinus. 5. Mild cortical volume loss and scattered  small vessel ischemic microangiopathy. 6. Mild degenerative change noted along the cervical spine. Electronically Signed   By: Garald Balding M.D.   On: 12/12/2017 21:18        Scheduled Meds: . aspirin EC  81 mg Oral Daily  . atorvastatin  80 mg Oral q1800  . clopidogrel  75 mg Oral Q breakfast  . fluticasone  2 spray Each Nare Daily  . furosemide  60 mg Oral BID  . insulin aspart  0-5 Units Subcutaneous QHS  . insulin aspart  0-9 Units Subcutaneous TID WC  . isosorbide mononitrate  30 mg Oral Daily  . mometasone-formoterol  2 puff Inhalation BID  . montelukast  10 mg Oral QHS  . multivitamin with minerals  1 tablet Oral Daily  . mupirocin ointment   Topical Daily  . pantoprazole  40 mg Oral Daily  . sodium chloride flush  3 mL Intravenous Q12H  . tiotropium  18 mcg Inhalation Daily   Continuous Infusions:   LOS: 2 days    Time spent: 40 minutes    Larenz Frasier, Geraldo Docker, MD [facility] Pager [phone]   If 7PM-7AM, please contact night-coverage [email or web address] Password [email or web address] 12/14/2017, 8:24 AM

## 2017-12-14 NOTE — Progress Notes (Signed)
This RN called down to Cath Lab to give report and Cath Lab denied report.

## 2017-12-14 NOTE — Progress Notes (Signed)
[facility]  Patient Status: Active (receiving services up to time of hospitalization)  [facility] is providing the following services: PT and OT  If patient discharges after hours, please call [phone].   [name] 12/14/2017, 9:45 AM

## 2017-12-14 NOTE — Progress Notes (Addendum)
Progress Note  Patient Name: [name] Date of Encounter: 12/14/2017  Primary Cardiologist: No primary care provider on file.   Subjective   Denies any symptoms this AM, no dizziness, no recurrent syncope or symptoms here, only c/o being hungry  Inpatient Medications    Scheduled Meds: . aspirin EC  81 mg Oral Daily  . atorvastatin  80 mg Oral q1800  . clopidogrel  75 mg Oral Q breakfast  . fluticasone  2 spray Each Nare Daily  . furosemide  60 mg Oral BID  . insulin aspart  0-5 Units Subcutaneous QHS  . insulin aspart  0-9 Units Subcutaneous TID WC  . isosorbide mononitrate  30 mg Oral Daily  . mometasone-formoterol  2 puff Inhalation BID  . montelukast  10 mg Oral QHS  . multivitamin with minerals  1 tablet Oral Daily  . mupirocin ointment   Topical Daily  . pantoprazole  40 mg Oral Daily  . sodium chloride flush  3 mL Intravenous Q12H  . tiotropium  18 mcg Inhalation Daily   Continuous Infusions:  PRN Meds: acetaminophen **OR** acetaminophen, albuterol, atropine, hydrALAZINE, nitroGLYCERIN, ondansetron **OR** ondansetron (ZOFRAN) IV, polyethylene glycol, zolpidem   Vital Signs    Vitals:   12/14/17 0008 12/14/17 0319 12/14/17 0429 12/14/17 0757  BP: (!) 121/52 130/66  139/70  Pulse: 62 65  63  Resp: 19 18  14   Temp: 98.4 F (36.9 C) 98.1 F (36.7 C)  98 F (36.7 C)  TempSrc: Oral Oral  Oral  SpO2: 95% 98%  98%  Weight:   269 lb (122 kg)   Height:        Intake/Output Summary (Last 24 hours) at 12/14/2017 0841 Last data filed at 12/14/2017 0701 Gross per 24 hour  Intake -  Output 1500 ml  Net -1500 ml   Filed Weights   12/12/17 2016 12/13/17 2255 12/14/17 0429  Weight: 260 lb (117.9 kg) 267 lb (121.1 kg) 269 lb (122 kg)    Telemetry    SR with variable heart block, Mobitz one with intermittent 2:1 conduction as well, V rates 40's-70's - Personally Reviewed  ECG    No new EKGs - Personally Reviewed  Physical Exam   GEN: No acute  distress.   Neck: No JVD Cardiac: RRR, no murmurs, rubs, or gallops.  Respiratory: CTA b/l. GI: Soft, nontender, non-distended  MS: No edema; No deformity. Neuro:  Nonfocal  Psych: Normal affect   Labs    Chemistry Recent Labs  Lab 12/12/17 2028 12/14/17 0409  NA 138 141  K 3.9 3.7  CL 96* 98*  CO2 34* 36*  GLUCOSE 205* 103*  BUN 21* 21*  CREATININE 1.79* 1.62*  CALCIUM 8.7* 8.6*  GFRNONAA 33* 37*  GFRAA 38* 43*  ANIONGAP 8 7     Hematology Recent Labs  Lab 12/12/17 2028 12/14/17 0409  WBC 8.2 7.9  RBC 4.48 4.30  HGB 13.4 12.7*  HCT 42.9 40.6  MCV 95.8 94.4  MCH 29.9 29.5  MCHC 31.2 31.3  RDW 13.5 13.5  PLT 180 168    Cardiac Enzymes Recent Labs  Lab 12/13/17 0443 12/13/17 1335  TROPONINI <0.03 <0.03    Recent Labs  Lab 12/12/17 2033  TROPIPOC 0.01     BNPNo results for input(s): BNP, PROBNP in the last 168 hours.   DDimer No results for input(s): DDIMER in the last 168 hours.   Radiology      Cardiac Studies   09/13/17: TTE  Study Conclusions - Procedure narrative: Transthoracic echocardiography. Image quality was poor. Intravenous contrast (Definity) was administered. - Left ventricle: The cavity size was normal. Wall thickness was increased in a pattern of mild LVH. Systolic function was normal. The estimated ejection fraction was in the range of 55% to 60%. Wall motion was normal; there were no regional wall motion abnormalities. Doppler parameters are consistent with abnormal left ventricular relaxation (grade 1 diastolic dysfunction). Indeterminate filling pressures. - Aortic valve: Moderately calcified annulus. Trileaflet. - Right ventricle: Systolic function was mildly reduced.    Patient Profile     84 y.o. male with a hx of CAD (CABG 1995, status post DES in June, 2013), COPD, chronic respiratory failure on home O2, CKD (III), HTN, chronic CHF (systolic/diastolic historically), morbid obesity, OSA, hx of  GIB, pt reports known hx of nasal/sinus polyps, following with ENT with recurrent surgeries, last 6 mo ago, (can not recall MD name), admitted with recurrent syncope, and heart block   Assessment & Plan    1. Syncope 2. Symptomatic bradycardia, [place] with brief periods of 2:1 conduction     Asymptomatic at rest here     BP remains stable     He last took his Bystolic 5/70/17 AM, in review of Up-to-date, 1/2 life is minimum 12 hours  The patient has been seen/examined by Dr. Curt Sanchez, has been 48 hours since his last dose of his BB, given he has not had any significant recovery in his conduction with continue episodes of 2:1 conduction, given his syncope recommends PPM implant today  Dr. Curt Sanchez discussed with the patient, implant procedure, risks and benefits.  He has no follow up questions for me and is willing to proceed  3. CAD     No anginal symptoms  4. COPD, chronic resp failure     On home O2 and here  5. Chronic CHF (mixed)     Echo in Feb of this year with LVEF 55-60%, grade I DD     Exam does not suggest fluid OL currently  6. CKD     Creat looks at about his baseline, 1.65 today    For questions or updates, please contact [place] Please consult [email or web address] for contact info under Cardiology/STEMI.      Signed, Fred Jamaica, PA-C  12/14/2017, 8:41 AM     I have seen and examined this patient with [name].  Agree with above, note added to reflect my findings.  On exam, RRR, no murmurs, lungs clear. Presented with syncope, found to have intermittent 2:1 AV block that has persisted despite beta blocker washout. Plan for pacemaker implant.  [name] has presented today for surgery, with the diagnosis of 2:1 AV block, syncope.  The various methods of treatment have been discussed with the patient and family. After consideration of risks, benefits and other options for treatment, the patient has consented to  Procedure(s): Pacemaker implant  as a surgical intervention .  Risks include but not limited to bleeding, tamponade, infection, pneumothorax, among others. The patient's history has been reviewed, patient examined, no change in status, stable for surgery.  I have reviewed the patient's chart and labs.  Questions were answered to the patient's satisfaction.    [name] M. Haiden Clucas MD 12/14/2017 9:13 AM

## 2017-12-14 NOTE — Progress Notes (Signed)
  Echocardiogram 2D Echocardiogram has been performed.  Reeder Brisby L Androw 12/14/2017, 9:39 AM

## 2017-12-15 ENCOUNTER — Encounter (HOSPITAL_COMMUNITY): Payer: Self-pay | Admitting: Cardiology

## 2017-12-15 ENCOUNTER — Inpatient Hospital Stay (HOSPITAL_COMMUNITY): Payer: Medicare Other

## 2017-12-15 DIAGNOSIS — W19XXXA Unspecified fall, initial encounter: Secondary | ICD-10-CM

## 2017-12-15 DIAGNOSIS — S0101XA Laceration without foreign body of scalp, initial encounter: Secondary | ICD-10-CM

## 2017-12-15 LAB — GLUCOSE, CAPILLARY
Glucose-Capillary: 101 mg/dL — ABNORMAL HIGH (ref 65–99)
Glucose-Capillary: 161 mg/dL — ABNORMAL HIGH (ref 65–99)

## 2017-12-15 MED ORDER — CLOPIDOGREL BISULFATE 75 MG PO TABS
75.0000 mg | ORAL_TABLET | Freq: Every day | ORAL | Status: DC
  Administered 2017-12-15: 75 mg via ORAL
  Filled 2017-12-15: qty 1

## 2017-12-15 MED FILL — Lidocaine HCl Local Inj 1%: INTRAMUSCULAR | Qty: 20 | Status: AC

## 2017-12-15 MED FILL — Lidocaine HCl Local Inj 1%: INTRAMUSCULAR | Qty: 60 | Status: AC

## 2017-12-15 NOTE — Progress Notes (Addendum)
Progress Note  Patient Name: [name] Date of Encounter: 12/15/2017  Primary Cardiologist: Dr. Acie Fredrickson  Subjective   Confused overnight requiring regular reorientation, this AM is much better, denies any CP, palpitations or SOB, no site discomfort  Inpatient Medications    Scheduled Meds: . aspirin EC  81 mg Oral Daily  . atorvastatin  80 mg Oral q1800  . fluticasone  2 spray Each Nare Daily  . furosemide  60 mg Oral BID  . insulin aspart  0-5 Units Subcutaneous QHS  . insulin aspart  0-9 Units Subcutaneous TID WC  . isosorbide mononitrate  30 mg Oral Daily  . mometasone-formoterol  2 puff Inhalation BID  . montelukast  10 mg Oral QHS  . multivitamin with minerals  1 tablet Oral Daily  . mupirocin ointment   Topical Daily  . pantoprazole  40 mg Oral Daily  . sodium chloride flush  3 mL Intravenous Q12H  . tiotropium  18 mcg Inhalation Daily   Continuous Infusions:  PRN Meds: acetaminophen, albuterol, atropine, hydrALAZINE, nitroGLYCERIN, ondansetron **OR** ondansetron (ZOFRAN) IV, ondansetron (ZOFRAN) IV, polyethylene glycol, zolpidem   Vital Signs    Vitals:   12/14/17 1746 12/14/17 2014 12/15/17 0417 12/15/17 0801  BP:  (!) 109/59 (!) 155/92 139/70  Pulse:  73 69 68  Resp:  16 19 15   Temp: 98.1 F (36.7 C) 97.9 F (36.6 C) 97.7 F (36.5 C) 97.9 F (36.6 C)  TempSrc: Oral Oral Oral Oral  SpO2:  95% 97% 99%  Weight:   266 lb 15.6 oz (121.1 kg)   Height:        Intake/Output Summary (Last 24 hours) at 12/15/2017 0943 Last data filed at 12/15/2017 0919 Gross per 24 hour  Intake 1088.33 ml  Output 1000 ml  Net 88.33 ml   Filed Weights   12/13/17 2255 12/14/17 0429 12/15/17 0417  Weight: 267 lb (121.1 kg) 269 lb (122 kg) 266 lb 15.6 oz (121.1 kg)    Telemetry     SR, V paced - Personally Reviewed  ECG     SR/V paced- Personally Reviewed  Physical Exam   GEN: No acute distress.   Neck: No JVD Cardiac: RRR, no murmurs, rubs, or gallops.    Respiratory: CTA b/l. GI: Soft, nontender, non-distended  MS: No edema; No deformity. Neuro:  Nonfocal, AAO x3 Psych: Normal affect PPM site is stable, dry, no hematoma   Labs    Chemistry Recent Labs  Lab 12/12/17 2028 12/14/17 0409  NA 138 141  K 3.9 3.7  CL 96* 98*  CO2 34* 36*  GLUCOSE 205* 103*  BUN 21* 21*  CREATININE 1.79* 1.62*  CALCIUM 8.7* 8.6*  GFRNONAA 33* 37*  GFRAA 38* 43*  ANIONGAP 8 7     Hematology Recent Labs  Lab 12/12/17 2028 12/14/17 0409  WBC 8.2 7.9  RBC 4.48 4.30  HGB 13.4 12.7*  HCT 42.9 40.6  MCV 95.8 94.4  MCH 29.9 29.5  MCHC 31.2 31.3  RDW 13.5 13.5  PLT 180 168    Cardiac Enzymes Recent Labs  Lab 12/13/17 0443 12/13/17 1335  TROPONINI <0.03 <0.03    Recent Labs  Lab 12/12/17 2033  TROPIPOC 0.01     BNPNo results for input(s): BNP, PROBNP in the last 168 hours.   DDimer No results for input(s): DDIMER in the last 168 hours.   Radiology      Cardiac Studies   09/13/17: TTE Study Conclusions - Procedure narrative: Transthoracic  echocardiography. Image quality was poor. Intravenous contrast (Definity) was administered. - Left ventricle: The cavity size was normal. Wall thickness was increased in a pattern of mild LVH. Systolic function was normal. The estimated ejection fraction was in the range of 55% to 60%. Wall motion was normal; there were no regional wall motion abnormalities. Doppler parameters are consistent with abnormal left ventricular relaxation (grade 1 diastolic dysfunction). Indeterminate filling pressures. - Aortic valve: Moderately calcified annulus. Trileaflet. - Right ventricle: Systolic function was mildly reduced.    Patient Profile     84 y.o. male with a hx of CAD (CABG 1995, status post DES in June, 2013), COPD, chronic respiratory failure on home O2, CKD (III), HTN, chronic CHF (systolic/diastolic historically), morbid obesity, OSA, hx of GIB, pt reports known hx of  nasal/sinus polyps, following with ENT with recurrent surgeries, last 6 mo ago, (can not recall MD name), admitted with recurrent syncope, and heart block   Assessment & Plan    1. Syncope 2. Symptomatic bradycardia, [place] with brief periods of 2:1 conduction     Asymptomatic at rest here     BP remains stable     He is now s/p PPM implant yesterday with Dr. Curt Bears     Device check this AM with intact function     CXR this AM without ptx     wound care and activity restrictions     Routine post implant EP follow up is arranged  EP service Deanne Bedgood sign off, though remain available, please recall if needed. Further care deferred to primary team  3. CAD     No anginal symptoms  4. COPD, chronic resp failure     On home O2 and here  5. Chronic CHF (mixed)     Echo in Feb of this year with LVEF 55-60%, grade I DD     Exam does not suggest fluid OL currently  6. CKD         For questions or updates, please contact [place] Please consult [email or web address] for contact info under Cardiology/STEMI.      Signed, Baldwin Jamaica, PA-C  12/15/2017, 9:43 AM     I have seen and examined this patient with [name].  Agree with above, note added to reflect my findings.  On exam, RRR, no murmurs, lungs clear.  [employer] dual-chamber pacemaker implanted for syncope and intermittent 2-1 AV block.  Device interrogation and chest x-ray without major abnormality.  Follow-up arranged in device clinic.  [name] M. Mclane Arora MD 12/15/2017 10:43 AM

## 2017-12-15 NOTE — Care Management Important Message (Signed)
Important Message  Patient Details  Name: [name] MRN: [id number] Date of Birth: 10-01-1930   Medicare Important Message Given:  Yes    Orbie Pyo 12/15/2017, 3:22 PM

## 2017-12-15 NOTE — Discharge Summary (Signed)
Discharge Summary  [name]:[id number] DOB: 05-Dec-1930  PCP: Lawerance Cruel, MD  Admit date: 12/12/2017 Discharge date: 12/15/2017   Time spent: < 25 minutes  Admitted From: Home Disposition:  Home  Recommendations for Outpatient Follow-up:  1. Follow up with PCP in 1-2 weeks 2. Follow up with Cardiology arranged 3. Home Bystolic discontinued in setting of symptomatic bradycardia.    Home Health: Resume RN, PT, OT on discharge Equipment/Devices:Oxygen(DME in for 3 L)  Discharge Diagnoses:  Active Hospital Problems   Diagnosis Date Noted  . Syncope 08/06/2010  . 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 GERD (gastroesophageal reflux disease) 12/12/2017  . Fall 12/12/2017  . History of recent fall 10/17/2015  . Atrial fibrillation ([place])   . CKD (chronic kidney disease), stage III ([place]) 08/07/2013  . COPD (chronic obstructive pulmonary disease) ([place]) 08/07/2013  . CAD (coronary artery disease)   . Chronic diastolic CHF (congestive heart failure) ([place])   . HLD (hyperlipidemia)   . Type II diabetes mellitus with renal manifestations ([place]) 01/06/2012    Resolved Hospital Problems  No resolved problems to display.    Discharge Condition: Stable  CODE STATUS:FULL Diet recommendation: Heart Healthy  Vitals:   12/15/17 0801 12/15/17 1157  BP: 139/70 122/72  Pulse: 68 87  Resp: 15 18  Temp: 97.9 F (36.6 C) 98 F (36.7 C)  SpO2: 99% 97%    History of present illness:  [name] is a 84 y.o. year old male with medical history significant for CAD (CABG 1995, status post DES in June, 2013), COPD, chronic respiratory failure on home O2, CKD (III), HTN, chronic CHF (systolic/diastolic historically), morbid obesity, OSA, hx of GIB who presented on 12/12/2017 with after "blacking out" while walking outside with no prodromal symptoms and was found to have symptomatic bradycardia. Remaining hospital course addressed in problem based format below:   Hospital  Course:  Principal Problem:   Syncope Active Problems:   Type II diabetes mellitus with renal manifestations (HCC)   Chronic diastolic CHF (congestive heart failure) (HCC)   CAD (coronary artery disease)   HLD (hyperlipidemia)   COPD (chronic obstructive pulmonary disease) (HCC)   CKD (chronic kidney disease), stage III (HCC)   Atrial fibrillation (HCC)   History of recent fall   Maxillary sinus mass   GERD (gastroesophageal reflux disease)   Fall   1. Symptomatic bradycardia.  During ED evaluation found to have heart rate range of 30-130 concerning for tachybrady syndrome. Troponin was negative. Cardiology was consulted and diagnosed patient with [facility] with brief pauses.   His home beta blocker was discontinued but still had no recovery in his heart rate after 48 hours of his beta blocker. He received a permanent pacemaker given 2:1 conduction and syncopal symptoms.  Cardiology follow was arranged on dishcarge 2. Syncope, suspect cardiogenic. Related to #1.  Patient presented after "blacking out" while walking outside. CT head was negative for acute intracranial abnormalities. Worked  3. Hypertension. SBP in 220s on presentation with improvement quickly to 97/67 in ED 4. CHFpEF.  Remained compensated during hospitalization. TTE on admission. EF: 60-65% on admission here.  5. CAD. No chest pain throughout hospital stay. Troponin negative on admission  Antibiotics: none  Microbiology: none  Consultations:  Cardiology   Procedures/Studies:  Permanent pacemaker on Jan 10, 2023.   TTE: 01/09/18:   Study Conclusions  - Procedure narrative: Transthoracic echocardiography. Technically   difficult study. Definity contrast given. - Left ventricle: The cavity size was normal. Wall thickness  was   normal. Systolic function was normal. The estimated ejection   fraction was in the range of 60% to 65%. Wall motion was normal;   there were no regional wall motion abnormalities. Doppler    parameters are consistent with abnormal left ventricular   relaxation (grade 1 diastolic dysfunction). The E/e&' ratio is   between 8-15, suggesting indeterminate LV filling pressure. - Mitral valve: Mildly thickened leaflets . There was trivial   regurgitation. - Left atrium: The atrium was mildly dilated.  Impressions:  - Technically difficult study. LVEF 60-65%, normal wall thickness,   grossly normal wall motion, grade 1 DD, indeterminate LV filing   pressure, trivial TR, mild LAE , right heart imaging was limited,   no TR and IVC was not visualized.   Dg Chest 2 View  Result Date: 12/15/2017 CLINICAL DATA:  84 year old male status post cardiac device placement. EXAM: CHEST - 2 VIEW COMPARISON:  12/12/2017 and earlier. FINDINGS: AP and lateral views of the chest. Left chest dual lead cardiac pacemaker placed. Subclavian approach leads extend to the RV apex and right atrium region. Underlying low lung volumes. Prior CABG. Stable cardiac size and mediastinal contours. No pneumothorax. Stable pulmonary vascularity. No pleural effusion or consolidation. No acute osseous abnormality identified. Negative visible bowel gas pattern. IMPRESSION: 1. Left chest dual lead cardiac pacemaker placed with no adverse features. 2. Low lung volumes.  No new cardiopulmonary abnormality. Electronically Signed   By: Genevie Ann M.D.   On: 12/15/2017 08:38   Dg Chest 2 View  Result Date: 12/12/2017 CLINICAL DATA:  Syncope. EXAM: CHEST - 2 VIEW COMPARISON:  09/12/2017. FINDINGS: The heart size appears within normal limits. Previous median sternotomy CABG procedure. Decreased lung volumes. There is no pleural effusion or edema. No airspace opacities identified. IMPRESSION: No active cardiopulmonary disease. Electronically Signed   By: Kerby Moors M.D.   On: 12/12/2017 20:49   Ct Head Wo Contrast  Result Date: 12/12/2017 CLINICAL DATA:  Acute onset of syncope and fall. Hit head. Laceration at the back of the  head. Concern for cervical spine injury. EXAM: CT HEAD WITHOUT CONTRAST CT CERVICAL SPINE WITHOUT CONTRAST TECHNIQUE: Multidetector CT imaging of the head and cervical spine was performed following the standard protocol without intravenous contrast. Multiplanar CT image reconstructions of the cervical spine were also generated. COMPARISON:  Paranasal sinus CT performed 10/02/2014 FINDINGS: CT HEAD FINDINGS Brain: No evidence of acute infarction, hemorrhage, hydrocephalus, extra-axial collection or mass lesion / mass effect. Prominence of the ventricles and sulci reflects mild cortical volume loss. Scattered periventricular and subcortical white matter change likely reflects small vessel ischemic microangiopathy. The brainstem and fourth ventricle are within normal limits. The basal ganglia are unremarkable in appearance. The cerebral hemispheres demonstrate grossly normal gray-white differentiation. No mass effect or midline shift is seen. Vascular: No hyperdense vessel or unexpected calcification. Skull: There is no evidence of fracture; visualized osseous structures are unremarkable in appearance. Sinuses/Orbits: The visualized portions of the orbits are within normal limits. A polypoid mass is again noted filling the surgically opened right maxillary antrum and right-sided nasal passages, increased in size from the prior study and extending inferiorly into the oropharynx. There is partial opacification of the right frontal sinus. The remaining paranasal sinuses and mastoid air cells are well-aerated. Other: Soft tissue swelling and laceration are noted at the occiput. CT CERVICAL SPINE FINDINGS Alignment: There is grade 1 anterolisthesis of C4 on C5, reflecting underlying facet disease. Mild degenerative change is noted about the  dens. Skull base and vertebrae: No acute fracture. There is mild chronic loss of height at vertebral body C5. No primary bone lesion or focal pathologic process. Soft tissues and spinal  canal: No prevertebral fluid or swelling. No visible canal hematoma. Disc levels: Multilevel disc space narrowing is noted along the cervical spine, with underlying anterior and posterior disc osteophyte complexes. Upper chest: The visualized portions of the thyroid gland are unremarkable. The visualized lung apices are clear. Other: No additional soft tissue abnormalities are seen. IMPRESSION: 1. No evidence of traumatic intracranial injury or fracture. 2. No evidence of fracture or subluxation along the cervical spine. 3. Soft tissue swelling and laceration at the occiput. 4. 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 Given extension into the oropharynx, surgical consultation is recommended. Corresponding partial opacification of the right frontal sinus. 5. Mild cortical volume loss and scattered small vessel ischemic microangiopathy. 6. Mild degenerative change noted along the cervical spine. Electronically Signed   By: Garald Balding M.D.   On: 12/12/2017 21:18   Ct Cervical Spine Wo Contrast  Result Date: 12/12/2017 CLINICAL DATA:  Acute onset of syncope and fall. Hit head. Laceration at the back of the head. Concern for cervical spine injury. EXAM: CT HEAD WITHOUT CONTRAST CT CERVICAL SPINE WITHOUT CONTRAST TECHNIQUE: Multidetector CT imaging of the head and cervical spine was performed following the standard protocol without intravenous contrast. Multiplanar CT image reconstructions of the cervical spine were also generated. COMPARISON:  Paranasal sinus CT performed 10/02/2014 FINDINGS: CT HEAD FINDINGS Brain: No evidence of acute infarction, hemorrhage, hydrocephalus, extra-axial collection or mass lesion / mass effect. Prominence of the ventricles and sulci reflects mild cortical volume loss. Scattered  periventricular and subcortical white matter change likely reflects small vessel ischemic microangiopathy. The brainstem and fourth ventricle are within normal limits. The basal ganglia are unremarkable in appearance. The cerebral hemispheres demonstrate grossly normal gray-white differentiation. No mass effect or midline shift is seen. Vascular: No hyperdense vessel or unexpected calcification. Skull: There is no evidence of fracture; visualized osseous structures are unremarkable in appearance. Sinuses/Orbits: The visualized portions of the orbits are within normal limits. A polypoid mass is again noted filling the surgically opened right maxillary antrum and right-sided nasal passages, increased in size from the prior study and extending inferiorly into the oropharynx. There is partial opacification of the right frontal sinus. The remaining paranasal sinuses and mastoid air cells are well-aerated. Other: Soft tissue swelling and laceration are noted at the occiput. CT CERVICAL SPINE FINDINGS Alignment: There is grade 1 anterolisthesis of C4 on C5, reflecting underlying facet disease. Mild degenerative change is noted about the dens. Skull base and vertebrae: No acute fracture. There is mild chronic loss of height at vertebral body C5. No primary bone lesion or focal pathologic process. Soft tissues and spinal canal: No prevertebral fluid or swelling. No visible canal hematoma. Disc levels: Multilevel disc space narrowing is noted along the cervical spine, with underlying anterior and posterior disc osteophyte complexes. Upper chest: The visualized portions of the thyroid gland are unremarkable. The visualized lung apices are clear. Other: No additional soft tissue abnormalities are seen. IMPRESSION: 1. No evidence of traumatic intracranial injury or fracture. 2. No evidence of fracture or subluxation along the cervical spine. 3. Soft tissue swelling and laceration at the occiput. 4. Enlarging polypoid mass  filling  the surgically open right maxillary antrum and right-sided nasal passages. 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 Given extension into the oropharynx, surgical consultation is recommended. Corresponding partial opacification of the right frontal sinus. 5. Mild cortical volume loss and scattered small vessel ischemic microangiopathy. 6. Mild degenerative change noted along the cervical spine. Electronically Signed   By: Garald Balding M.D.   On: 12/12/2017 21:18     Discharge Exam: BP 122/72 (BP Location: Right Arm)   Pulse 87   Temp 98 F (36.7 C) (Oral)   Resp 18   Ht 5\' 11"  (1.803 m)   Wt 121.1 kg (266 lb 15.6 oz)   SpO2 97%   BMI 37.24 kg/m   General: Lying in bed, no apparent distress Eyes: EOMI, anicteric ENT: Oral Mucosa clear and moist Cardiovascular: regular rate and rhythm, no murmurs, rubs or gallops,  no edema, no JVD Respiratory: Normal respiratory effort, lungs clear to auscultation bilaterally Abdomen: soft, non-distended, non-tender, normal bowel sounds Skin: No Rash Musculoskeletal:Good ROM, no contractures. Normal muscle tone Neurologic: Grossly no focal neuro deficit.Mental status AAOx3, speech normal, Psychiatric:Appropriate affect, and mood   Discharge Instructions You were cared for by a hospitalist during your hospital stay. If you have any questions about your discharge medications or the care you received while you were in the hospital after you are discharged, you can call the unit and asked to speak with the hospitalist on call if the hospitalist that took care of you is not available. Once you are discharged, your primary care physician will handle any further medical issues. Please note that NO REFILLS for any discharge medications will be authorized once you are discharged, as it is imperative that you return to your primary care physician (or establish  a relationship with a primary care physician if you do not have one) for your aftercare needs so that they can reassess your need for medications and monitor your lab values.  Discharge Instructions    Diet - low sodium heart healthy   Complete by:  As directed    Diet - low sodium heart healthy   Complete by:  As directed    Increase activity slowly   Complete by:  As directed    Increase activity slowly   Complete by:  As directed      Allergies as of 12/15/2017      Reactions   Other Other (See Comments)   Cream cheese-makes very sick      Medication List    STOP taking these medications   BYSTOLIC 10 MG tablet Generic drug:  nebivolol   predniSONE 10 MG tablet Commonly known as:  DELTASONE     TAKE these medications   albuterol 108 (90 Base) MCG/ACT inhaler Commonly known as:  PROVENTIL HFA;VENTOLIN HFA Inhale 2 puffs into the lungs every 6 (six) hours as needed for wheezing or shortness of breath.   albuterol (2.5 MG/3ML) 0.083% nebulizer solution Commonly known as:  PROVENTIL Take 3 mLs (2.5 mg total) by nebulization every 6 (six) hours as needed for wheezing.   ARTIFICIAL TEAR OP Place 2 drops into both eyes daily as needed. For dry eyes   aspirin 81 MG tablet Take 1 tablet (81 mg total) by mouth daily.   atorvastatin 80 MG tablet Commonly known as:  LIPITOR Take 1 tablet (80 mg total) by mouth daily at 6 PM.   budesonide-formoterol 160-4.5 MCG/ACT inhaler  Commonly known as:  SYMBICORT Inhale 2 puffs into the lungs 2 (two) times daily.   clopidogrel 75 MG tablet Commonly known as:  PLAVIX Take 1 tablet (75 mg total) by mouth daily with breakfast.   fluticasone 50 MCG/ACT nasal spray Commonly known as:  FLONASE Place 2 sprays into both nostrils daily.   furosemide 40 MG tablet Commonly known as:  LASIX Take 1.5 tablets (60 mg total) by mouth 2 (two) times daily.   glimepiride 2 MG tablet Commonly known as:  AMARYL Take 2 mg by mouth daily with  breakfast.   isosorbide mononitrate 30 MG 24 hr tablet Commonly known as:  IMDUR Take 1 tablet (30 mg total) by mouth daily.   montelukast 10 MG tablet Commonly known as:  SINGULAIR Take 10 mg by mouth at bedtime.   MULTIVITAMIN & MINERAL PO Take 1 tablet by mouth daily.   nitroGLYCERIN 0.4 MG SL tablet Commonly known as:  NITROSTAT Place 1 tablet (0.4 mg total) under the tongue every 5 (five) minutes as needed for chest pain.   pantoprazole 40 MG tablet Commonly known as:  PROTONIX Take 1 tablet (40 mg total) by mouth daily. Patient overdue for an appointment. Needs to call and schedule for further refills 2nd attempt   potassium chloride SA 20 MEQ tablet Commonly known as:  K-DUR,KLOR-CON Take 1 tablet (20 mEq total) by mouth 2 (two) times daily. Please make overdue yearly appt with Dr.Nahser before anymore refills 2nd attempt   tiotropium 18 MCG inhalation capsule Commonly known as:  SPIRIVA Place 1 capsule (18 mcg total) into inhaler and inhale daily.      Allergies  Allergen Reactions  . Other Other (See Comments)    Cream cheese-makes very sick   Follow-up Information    [facility] Follow up on 12/27/2017.   Specialty:  Cardiology Why:  3:00PM, wound check visit Contact information: [address] [place]       Constance Haw, MD Follow up on 03/16/2018.   Specialty:  Cardiology Why:  10:45AM Contact information: [address] [place] [address] [place] Follow up.   Specialty:  Home Health Services Why:  Physical Therapy/ Occupational Therapy, Registered Nurse.  Contact information: [address] [place] [address] [phone]            The results of significant diagnostics from this hospitalization (including imaging, microbiology, ancillary and laboratory) are listed below for reference.     Significant Diagnostic Studies: Dg Chest 2 View  Result Date: 12/15/2017 CLINICAL DATA:  84 year old male status post cardiac device placement. EXAM: CHEST - 2 VIEW COMPARISON:  12/12/2017 and earlier. FINDINGS: AP and lateral views of the chest. Left chest dual lead cardiac pacemaker placed. Subclavian approach leads extend to the RV apex and right atrium region. Underlying low lung volumes. Prior CABG. Stable cardiac size and mediastinal contours. No pneumothorax. Stable pulmonary vascularity. No pleural effusion or consolidation. No acute osseous abnormality identified. Negative visible bowel gas pattern. IMPRESSION: 1. Left chest dual lead cardiac pacemaker placed with no adverse features. 2. Low lung volumes.  No new cardiopulmonary abnormality. Electronically Signed   By: Genevie Ann M.D.   On: 12/15/2017 08:38   Dg Chest 2 View  Result Date: 12/12/2017 CLINICAL DATA:  Syncope. EXAM: CHEST - 2 VIEW COMPARISON:  09/12/2017. FINDINGS: The heart size appears within  normal limits. Previous median sternotomy CABG procedure. Decreased lung volumes. There is no pleural effusion or edema. No airspace opacities identified. IMPRESSION: No active cardiopulmonary disease. Electronically Signed   By: Kerby Moors M.D.   On: 12/12/2017 20:49   Ct Head Wo Contrast  Result Date: 12/12/2017 CLINICAL DATA:  Acute onset of syncope and fall. Hit head. Laceration at the back of the head. Concern for cervical spine injury. EXAM: CT HEAD WITHOUT CONTRAST CT CERVICAL SPINE WITHOUT CONTRAST TECHNIQUE: Multidetector CT imaging of the head and cervical spine was performed following the standard protocol without intravenous contrast. Multiplanar CT image reconstructions of the cervical spine were also generated. COMPARISON:  Paranasal sinus CT performed 10/02/2014 FINDINGS: CT HEAD FINDINGS Brain: No evidence of acute infarction, hemorrhage, hydrocephalus, extra-axial collection or mass lesion / mass effect. Prominence of  the ventricles and sulci reflects mild cortical volume loss. Scattered periventricular and subcortical white matter change likely reflects small vessel ischemic microangiopathy. The brainstem and fourth ventricle are within normal limits. The basal ganglia are unremarkable in appearance. The cerebral hemispheres demonstrate grossly normal gray-white differentiation. No mass effect or midline shift is seen. Vascular: No hyperdense vessel or unexpected calcification. Skull: There is no evidence of fracture; visualized osseous structures are unremarkable in appearance. Sinuses/Orbits: The visualized portions of the orbits are within normal limits. A polypoid mass is again noted filling the surgically opened right maxillary antrum and right-sided nasal passages, increased in size from the prior study and extending inferiorly into the oropharynx. There is partial opacification of the right frontal sinus. The remaining paranasal sinuses and mastoid air cells are well-aerated. Other: Soft tissue swelling and laceration are noted at the occiput. CT CERVICAL SPINE FINDINGS Alignment: There is grade 1 anterolisthesis of C4 on C5, reflecting underlying facet disease. Mild degenerative change is noted about the dens. Skull base and vertebrae: No acute fracture. There is mild chronic loss of height at vertebral body C5. No primary bone lesion or focal pathologic process. Soft tissues and spinal canal: No prevertebral fluid or swelling. No visible canal hematoma. Disc levels: Multilevel disc space narrowing is noted along the cervical spine, with underlying anterior and posterior disc osteophyte complexes. Upper chest: The visualized portions of the thyroid gland are unremarkable. The visualized lung apices are clear. Other: No additional soft tissue abnormalities are seen. IMPRESSION: 1. No evidence of traumatic intracranial injury or fracture. 2. No evidence of fracture or subluxation along the cervical spine. 3. Soft tissue  swelling and laceration at the occiput. 4. 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 Given extension into the oropharynx, surgical consultation is recommended. Corresponding partial opacification of the right frontal sinus. 5. Mild cortical volume loss and scattered small vessel ischemic microangiopathy. 6. Mild degenerative change noted along the cervical spine. Electronically Signed   By: Garald Balding M.D.   On: 12/12/2017 21:18   Ct Cervical Spine Wo Contrast  Result Date: 12/12/2017 CLINICAL DATA:  Acute onset of syncope and fall. Hit head. Laceration at the back of the head. Concern for cervical spine injury. EXAM: CT HEAD WITHOUT CONTRAST CT CERVICAL SPINE WITHOUT CONTRAST TECHNIQUE: Multidetector CT imaging of the head and cervical spine was performed following the standard protocol without intravenous contrast. Multiplanar CT image reconstructions of the cervical spine were also generated. COMPARISON:  Paranasal sinus CT performed 10/02/2014 FINDINGS: CT  HEAD FINDINGS Brain: No evidence of acute infarction, hemorrhage, hydrocephalus, extra-axial collection or mass lesion / mass effect. Prominence of the ventricles and sulci reflects mild cortical volume loss. Scattered periventricular and subcortical white matter change likely reflects small vessel ischemic microangiopathy. The brainstem and fourth ventricle are within normal limits. The basal ganglia are unremarkable in appearance. The cerebral hemispheres demonstrate grossly normal gray-white differentiation. No mass effect or midline shift is seen. Vascular: No hyperdense vessel or unexpected calcification. Skull: There is no evidence of fracture; visualized osseous structures are unremarkable in appearance. Sinuses/Orbits: The visualized portions  of the orbits are within normal limits. A polypoid mass is again noted filling the surgically opened right maxillary antrum and right-sided nasal passages, increased in size from the prior study and extending inferiorly into the oropharynx. There is partial opacification of the right frontal sinus. The remaining paranasal sinuses and mastoid air cells are well-aerated. Other: Soft tissue swelling and laceration are noted at the occiput. CT CERVICAL SPINE FINDINGS Alignment: There is grade 1 anterolisthesis of C4 on C5, reflecting underlying facet disease. Mild degenerative change is noted about the dens. Skull base and vertebrae: No acute fracture. There is mild chronic loss of height at vertebral body C5. No primary bone lesion or focal pathologic process. Soft tissues and spinal canal: No prevertebral fluid or swelling. No visible canal hematoma. Disc levels: Multilevel disc space narrowing is noted along the cervical spine, with underlying anterior and posterior disc osteophyte complexes. Upper chest: The visualized portions of the thyroid gland are unremarkable. The visualized lung apices are clear. Other: No additional soft tissue abnormalities are seen. IMPRESSION: 1. No evidence of traumatic intracranial injury or fracture. 2. No evidence of fracture or subluxation along the cervical spine. 3. Soft tissue swelling and laceration at the occiput. 4. 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 Given extension into the oropharynx, surgical consultation is recommended. Corresponding partial opacification of the right frontal sinus. 5. Mild cortical volume loss and scattered small vessel ischemic microangiopathy. 6. Mild degenerative change noted along the cervical spine. Electronically Signed   By: Garald Balding  M.D.   On: 12/12/2017 21:18    Microbiology: Recent Results (from the past 240 hour(s))  MRSA PCR Screening     Status: None   Collection Time: 12/14/17 12:06 AM  Result Value Ref Range Status   MRSA by PCR NEGATIVE NEGATIVE Final    Comment:        The GeneXpert MRSA Assay (FDA approved for NASAL specimens only), is one component of a comprehensive MRSA colonization surveillance program. It is not intended to diagnose MRSA infection nor to guide or monitor treatment for MRSA infections. Performed at [facility], [place] [address], [place], [place] [address]   Surgical PCR screen     Status: None   Collection Time: 12/14/17  9:54 AM  Result Value Ref Range Status   MRSA, PCR NEGATIVE NEGATIVE Final   Staphylococcus aureus NEGATIVE NEGATIVE Final    Comment: (NOTE) The Xpert SA Assay (FDA approved for NASAL specimens in patients 36 years of age and older), is one component of a comprehensive surveillance program. It is not intended to diagnose infection nor to guide or monitor treatment. Performed at [facility], [place] [address] [place], [place] [address]      Labs: Basic  Metabolic Panel: Recent Labs  Lab 12/12/17 2028 12/14/17 0409  NA 138 141  K 3.9 3.7  CL 96* 98*  CO2 34* 36*  GLUCOSE 205* 103*  BUN 21* 21*  CREATININE 1.79* 1.62*  CALCIUM 8.7* 8.6*   Liver Function Tests: No results for input(s): AST, ALT, ALKPHOS, BILITOT, PROT, ALBUMIN in the last 168 hours. No results for input(s): LIPASE, AMYLASE in the last 168 hours. No results for input(s): AMMONIA in the last 168 hours. CBC: Recent Labs  Lab 12/12/17 2028 12/14/17 0409  WBC 8.2 7.9  HGB 13.4 12.7*  HCT 42.9 40.6  MCV 95.8 94.4  PLT 180 168   Cardiac Enzymes: Recent Labs  Lab 12/13/17 0443 12/13/17 1335  TROPONINI <0.03 <0.03   BNP: BNP (last 3 results) No results for input(s): BNP in the last 8760 hours.  ProBNP (last 3 results) No results for input(s): PROBNP in the  last 8760 hours.  CBG: Recent Labs  Lab 12/14/17 1640 12/14/17 1751 12/14/17 2148 12/15/17 0759 12/15/17 1155  GLUCAP 96 79 200* 101* 161*       Signed:  Desiree Hane, MD [facility] 12/15/2017, 2:27 PM

## 2017-12-15 NOTE — Progress Notes (Signed)
Pt found to be very confused throughout the night. Multiple attempts made to exit bed with bed alarm activation. Pt stating that he "needs to get up and do things". Pt had  removed leads, gown and condom cath. Sling in place but pt using arm with no restriction. Pt expressed desire to go to sleep but not able to. Stimulation decreased and sleep aid given but not effective. Pt able to answer all orientation questions appropriately but states he feels confused. Pt redirected on multiple occasions, will continue to monitor closely.  Jacqlyn Larsen, RN

## 2017-12-15 NOTE — Care Management Note (Signed)
Case Management Note  Patient Details  Name: [name] MRN: [id number] Date of Birth: April 01, 1931  Subjective/Objective: Pt presented for Syncope. Pt is from home alone. Has support of sons to check in with him. PTA previously active with [facility] for RN, PT, OT- pt will need resumption orders and F2F once stable to transition home. Pt has DME 02 via [facility] at 3 L.                     Action/Plan: CM did make liaison aware that possible plan to transition home today. No further needs from CM at this time.   Expected Discharge Date:                  Expected Discharge Plan:  [place]  In-House Referral:  NA  Discharge planning Services  CM Consult  Post Acute Care Choice:  Home Health, Resumption of Svcs/PTA Provider Choice offered to:  Patient  DME Arranged:  N/A DME Agency:  NA  HH Arranged:  PT, OT, RN, Disease Management [place] Agency:  [facility]  Status of Service:  Completed, signed off  If discussed at [place] of Stay Meetings, dates discussed:    Additional Comments:  Bethena Roys, RN 12/15/2017, 12:43 PM

## 2017-12-15 NOTE — Discharge Instructions (Signed)
° ° °  Supplemental Discharge Instructions for  Pacemaker/Defibrillator Patients  Activity No heavy lifting or vigorous activity with your left/right arm for 6 to 8 weeks.  Do not raise your left/right arm above your head for one week.  Gradually raise your affected arm as drawn below.              12/18/17                      12/19/17                      12/20/17                     12/21/17 __  NO DRIVING (patient does not drive).  WOUND CARE - Keep the wound area clean and dry.  Do not get this area wet, no showers until cleared to at your wound check visit. - The tape/steri-strips on your wound will fall off; do not pull them off.  No bandage is needed on the site.  DO  NOT apply any creams, oils, or ointments to the wound area. - If you notice any drainage or discharge from the wound, any swelling or bruising at the site, or you develop a fever > 101? F after you are discharged home, call the office at once.  Special Instructions - You are still able to use cellular telephones; use the ear opposite the side where you have your pacemaker/defibrillator.  Avoid carrying your cellular phone near your device. - When traveling through airports, show security personnel your identification card to avoid being screened in the metal detectors.  Ask the security personnel to use the hand wand. - Avoid arc welding equipment, MRI testing (magnetic resonance imaging), TENS units (transcutaneous nerve stimulators).  Call the office for questions about other devices. - Avoid electrical appliances that are in poor condition or are not properly grounded. - Microwave ovens are safe to be near or to operate.  Additional information for defibrillator patients should your device go off: - If your device goes off ONCE and you feel fine afterward, notify the device clinic nurses. - If your device goes off ONCE and you do not feel well afterward, call 911. - If your device goes off TWICE, call 911. - If your device  goes off THREE times in one day, call 911.  DO NOT DRIVE YOURSELF OR A FAMILY MEMBER WITH A DEFIBRILLATOR TO THE HOSPITAL--CALL 911.

## 2017-12-22 ENCOUNTER — Other Ambulatory Visit: Payer: Self-pay | Admitting: Cardiovascular Disease

## 2017-12-22 MED ORDER — POTASSIUM CHLORIDE CRYS ER 20 MEQ PO TBCR: 20.0000 meq | EXTENDED_RELEASE_TABLET | Freq: Two times a day (BID) | ORAL | 0 refills | Status: AC

## 2017-12-27 ENCOUNTER — Ambulatory Visit (INDEPENDENT_AMBULATORY_CARE_PROVIDER_SITE_OTHER): Payer: Medicare Other | Admitting: *Deleted

## 2017-12-27 DIAGNOSIS — R55 Syncope and collapse: Secondary | ICD-10-CM | POA: Diagnosis not present

## 2017-12-27 LAB — CUP PACEART INCLINIC DEVICE CHECK
Battery Remaining Longevity: 141 mo
Battery Voltage: 3.17 V
Brady Statistic AP VP Percent: 7.15 %
Brady Statistic RA Percent Paced: 8.02 %
Implantable Lead Implant Date: 20190529
Implantable Lead Location: 753860
Implantable Lead Model: 5076
Implantable Pulse Generator Implant Date: 20190529
Lead Channel Impedance Value: 304 Ohm
Lead Channel Impedance Value: 456 Ohm
Lead Channel Impedance Value: 551 Ohm
Lead Channel Pacing Threshold Amplitude: 0.75 V
Lead Channel Pacing Threshold Pulse Width: 0.4 ms
Lead Channel Pacing Threshold Pulse Width: 0.4 ms
Lead Channel Sensing Intrinsic Amplitude: 1 mV
Lead Channel Sensing Intrinsic Amplitude: 1.125 mV
Lead Channel Setting Pacing Amplitude: 3.5 V
Lead Channel Setting Sensing Sensitivity: 2 mV
MDC IDC LEAD IMPLANT DT: 20190529
MDC IDC LEAD LOCATION: 753859
MDC IDC MSMT LEADCHNL RA IMPEDANCE VALUE: 475 Ohm
MDC IDC MSMT LEADCHNL RV PACING THRESHOLD AMPLITUDE: 0.5 V
MDC IDC MSMT LEADCHNL RV SENSING INTR AMPL: 11.75 mV
MDC IDC MSMT LEADCHNL RV SENSING INTR AMPL: 20 mV
MDC IDC SESS DTM: 20190611154156
MDC IDC SET LEADCHNL RV PACING AMPLITUDE: 3.5 V
MDC IDC SET LEADCHNL RV PACING PULSEWIDTH: 0.4 ms
MDC IDC STAT BRADY AP VS PERCENT: 0 %
MDC IDC STAT BRADY AS VP PERCENT: 91.11 %
MDC IDC STAT BRADY AS VS PERCENT: 1.74 %
MDC IDC STAT BRADY RV PERCENT PACED: 98.26 %

## 2017-12-27 NOTE — Progress Notes (Signed)
Wound check appointment. Steri-strips removed. Wound without redness or edema. Incision edges approximated, wound well healed. Normal device function. Thresholds, sensing, and impedances consistent with implant measurements. Device programmed at 3.5V on for extra safety margin until 3 month visit. Histogram distribution appropriate for patient and level of activity. No mode switches or high ventricular rates noted. Patient educated about wound care, arm mobility, lifting restrictions. ROV in 03/16/2018 w/ WC

## 2017-12-29 ENCOUNTER — Telehealth: Payer: Self-pay | Admitting: Cardiovascular Disease

## 2017-12-29 NOTE — Telephone Encounter (Signed)
Left message for Jessica to call back.  

## 2017-12-29 NOTE — Telephone Encounter (Signed)
Janett Billow, RN from [facility] called to inform us that patient has been enrolled in their [place] program. She requests patient's most recent EF which I reported was 60-65% from echo on 12/14/17. She verified Dr. Elmarie Shiley fax number and physical address and thanked me for my help.

## 2017-12-29 NOTE — Telephone Encounter (Signed)
New Message:       Janett Billow from [employer] is calling and states that the pt has been enrolled in the [place] program and she needed to get some information to complete the patients enrollment.

## 2017-12-31 ENCOUNTER — Other Ambulatory Visit: Payer: Self-pay | Admitting: Cardiovascular Disease

## 2017-12-31 DIAGNOSIS — I2510 Atherosclerotic heart disease of native coronary artery without angina pectoris: Secondary | ICD-10-CM

## 2018-01-31 ENCOUNTER — Other Ambulatory Visit: Payer: Self-pay | Admitting: Cardiovascular Disease

## 2018-01-31 MED ORDER — ATORVASTATIN CALCIUM 80 MG PO TABS: 80.0000 mg | ORAL_TABLET | Freq: Every day | ORAL | 0 refills | Status: DC

## 2018-02-20 ENCOUNTER — Other Ambulatory Visit: Payer: Self-pay | Admitting: Cardiovascular Disease

## 2018-02-20 DIAGNOSIS — I2510 Atherosclerotic heart disease of native coronary artery without angina pectoris: Secondary | ICD-10-CM

## 2018-03-16 ENCOUNTER — Encounter: Payer: Medicare Other | Admitting: Cardiology

## 2018-03-16 NOTE — Progress Notes (Deleted)
Electrophysiology Office Note   Date:  03/16/2018   ID:  Tonnie, [name] Mar 25, 1931, MRN [id number]  PCP:  Lawerance Cruel, MD  Cardiologist:  *** Primary Electrophysiologist: *** Valery Amedee [name], MD    No chief complaint on file.    History of Present Illness: [name] is a 84 y.o. male who is being seen today for the evaluation of *** at the request of Lawerance Cruel, MD. Presenting today for electrophysiology evaluation.  He has a history of coronary artery disease status post multiple PCI, diastolic heart failure, COPD, hypertension, OSA, stage III CKD who presented to the hospital 11/2017 with episode of syncope and was found to be in 2-1 AV block. He had a Medtronic dual-chamber pacemaker implanted 12/15/2017.  Today, he denies*** symptoms of palpitations, chest pain, shortness of breath, orthopnea, PND, lower extremity edema, claudication, dizziness, presyncope, syncope, bleeding, or neurologic sequela. The patient is tolerating medications without difficulties.    Past Medical History:  Diagnosis Date  . Acute blood loss anemia    a. 06/2013 due to GIB.  Marland Kitchen Arthritis    "knees; hands" (07/11/2013)  . Asthma   . CAD (coronary artery disease)    a. s/p CABG x 3 in 1995; b.  NSTEMI 12/2011 -> Cath 6/21: LM 20-30, LAD 90, RI 95, LCX 46m, RCA 90/178m (treated w/ 2.0x16 Promus DES), VG->RCA ok, VG->RI 70-12m (treated w/ 4.0x28 Promus), LIMA->LAD ok, EF 55-60%.  . Chronic diastolic CHF (congestive heart failure) ([place])    a. Echo 10/2011: EF 55-60%;  b. elevated EDP req diuresis.  . Chronic respiratory failure ([place]) 07/11/2013   a. 3L home O2 24/7.  Marland Kitchen CKD (chronic kidney disease)    a. ?Based on Cr 2013 - 1.3-1.7.  . COPD (chronic obstructive pulmonary disease) ([place])    a. on home O2  . GERD (gastroesophageal reflux disease)   . GI bleed    a. 06/2013 adm - prepyloric ulcer, path neg for malignancy. A/w ABL anemia.  Marland Kitchen HLD (hyperlipidemia)   . HOH (hard of  hearing)    left ear  . HTN (hypertension)   . Hypothyroidism   . Morbid obesity ([place])   . Orthopnea    a. Has been sleeping in a recliner x 30 yrs.  . Sleep apnea   . Syncope    Past Surgical History:  Procedure Laterality Date  . [place]   "when I was in the [place]" (07/11/2013)  . CORONARY ANGIOPLASTY WITH STENT PLACEMENT  12/2011  . CORONARY ARTERY BYPASS GRAFT  1995   CABG X3  . ESOPHAGOGASTRODUODENOSCOPY N/A 07/12/2013   Procedure: ESOPHAGOGASTRODUODENOSCOPY (EGD);  Surgeon: Inda Castle, MD;  Location: [facility];  Service: Endoscopy;  Laterality: N/A;  . EXCISIONAL HEMORRHOIDECTOMY     "twice cut them out; burnt them out once"  . FUNCTIONAL ENDOSCOPIC SINUS SURGERY  2006  . HYDROCELE EXCISION  2005  . INGUINAL HERNIA REPAIR Right 2006  . KNEE ARTHROSCOPY Right 1990's  . LEFT HEART CATHETERIZATION WITH CORONARY/GRAFT ANGIOGRAM  01/07/2012   Procedure: LEFT HEART CATHETERIZATION WITH Beatrix Fetters;  Surgeon: Peter M Martinique, MD;  Location[facility] LAB;  Service: Cardiovascular;;  . PACEMAKER IMPLANT N/A 12/14/2017   Procedure: PACEMAKER IMPLANT;  Surgeon: Constance Haw, MD;  Location: [facility] CV LAB;  Service: Cardiovascular;  Laterality: N/A;  . PERCUTANEOUS CORONARY STENT INTERVENTION (PCI-S) N/A 01/10/2012   Procedure: PERCUTANEOUS CORONARY STENT INTERVENTION (PCI-S);  Surgeon: Ander Slade  Martinique, MD;  Location: [facility] CATH LAB;  Service: Cardiovascular;  Laterality: N/A;  . PROSTATE SURGERY  1998     Current Outpatient Medications  Medication Sig Dispense Refill  . albuterol (PROVENTIL HFA;VENTOLIN HFA) 108 (90 BASE) MCG/ACT inhaler Inhale 2 puffs into the lungs every 6 (six) hours as needed for wheezing or shortness of breath.    Marland Kitchen albuterol (PROVENTIL) (2.5 MG/3ML) 0.083% nebulizer solution Take 3 mLs (2.5 mg total) by nebulization every 6 (six) hours as needed for wheezing. 75 mL 12  . ARTIFICIAL TEAR OP Place 2 drops into both eyes daily  as needed. For dry eyes    . aspirin 81 MG tablet Take 1 tablet (81 mg total) by mouth daily.    Marland Kitchen atorvastatin (LIPITOR) 80 MG tablet Take 1 tablet (80 mg total) by mouth daily at 6 PM. Please make overdue appt with Dr. Acie Fredrickson before anymore refills. 2nd attempt 15 tablet 0  . budesonide-formoterol (SYMBICORT) 160-4.5 MCG/ACT inhaler Inhale 2 puffs into the lungs 2 (two) times daily.    . clopidogrel (PLAVIX) 75 MG tablet Take 1 tablet (75 mg total) by mouth daily with breakfast. Please schedule yearly appt for more refills, thanks! [phone] 1st attmpt 30 tablet 0  . fluticasone (FLONASE) 50 MCG/ACT nasal spray Place 2 sprays into both nostrils daily. 16 g 0  . furosemide (LASIX) 40 MG tablet Take 1.5 tablets (60 mg total) by mouth 2 (two) times daily. 90 tablet 0  . glimepiride (AMARYL) 2 MG tablet Take 2 mg by mouth daily with breakfast.    . isosorbide mononitrate (IMDUR) 30 MG 24 hr tablet Take 1 tablet (30 mg total) by mouth daily. Please make overdue appt with Dr. Acie Fredrickson before anymore refills. 2nd attempt 15 tablet 0  . montelukast (SINGULAIR) 10 MG tablet Take 10 mg by mouth at bedtime.    . Multiple Vitamins-Minerals (MULTIVITAMIN & MINERAL PO) Take 1 tablet by mouth daily.      . nitroGLYCERIN (NITROSTAT) 0.4 MG SL tablet Place 1 tablet (0.4 mg total) under the tongue every 5 (five) minutes as needed for chest pain. 25 tablet 0  . pantoprazole (PROTONIX) 40 MG tablet Take 1 tablet (40 mg total) by mouth daily. Patient overdue for an appointment. Needs to call and schedule for further refills 2nd attempt 15 tablet 0  . potassium chloride SA (K-DUR,KLOR-CON) 20 MEQ tablet Take 1 tablet (20 mEq total) by mouth 2 (two) times daily. Please make overdue appt with Dr.Nahser. 3rd and Final attempt 30 tablet 0  . tiotropium (SPIRIVA) 18 MCG inhalation capsule Place 1 capsule (18 mcg total) into inhaler and inhale daily. 30 capsule 12   No current facility-administered medications for this  visit.     Allergies:   Other   Social History:  The patient  reports that he has never smoked. He has never used smokeless tobacco. He reports that he drinks alcohol. He reports that he does not use drugs.   Family History:  The patient's family history includes CAD (age of onset: 86) in his father.    ROS:  Please see the history of present illness.   Otherwise, review of systems is positive for ***.   All other systems are reviewed and negative.    PHYSICAL EXAM: VS:  There were no vitals taken for this visit. , BMI There is no height or weight on file to calculate BMI. GEN: Well nourished, well developed, in no acute distress  HEENT: normal  Neck: no  JVD, carotid bruits, or masses Cardiac: ***RRR; no murmurs, rubs, or gallops,no edema  Respiratory:  clear to auscultation bilaterally, normal work of breathing GI: soft, nontender, nondistended, + BS MS: no deformity or atrophy  Skin: warm and dry,  device pocket is well healed Neuro:  Strength and sensation are intact Psych: euthymic mood, full affect  EKG:  EKG {ACTION; IS/IS SWF:[id number]} ordered today. Personal review of the ekg ordered shows ***  *** Device interrogation is reviewed today in detail.  See PaceArt for details.   Recent Labs: 12/13/2017: TSH 2.534 12/14/2017: BUN 21; Creatinine, Ser 1.62; Hemoglobin 12.7; Platelets 168; Potassium 3.7; Sodium 141    Lipid Panel     Component Value Date/Time   CHOL 118 12/13/2017 0442   TRIG 82 12/13/2017 0442   HDL 41 12/13/2017 0442   CHOLHDL 2.9 12/13/2017 0442   VLDL 16 12/13/2017 0442   LDLCALC 61 12/13/2017 0442     Wt Readings from Last 3 Encounters:  12/15/17 266 lb 15.6 oz (121.1 kg)  09/12/17 258 lb 13.1 oz (117.4 kg)  11/05/16 260 lb 12.8 oz (118.3 kg)      Other studies Reviewed: Additional studies/ records that were reviewed today include: 09/13/17: TTE  Review of the above records today demonstrates:  - Left ventricle: The cavity size was normal.  Wall thickness was increased in a pattern of mild LVH. Systolic function was normal. The estimated ejection fraction was in the range of 55% to 60%. Wall motion was normal; there were no regional wall motion abnormalities. Doppler parameters are consistent with abnormal left ventricular relaxation (grade 1 diastolic dysfunction). Indeterminate filling pressures. - Aortic valve: Moderately calcified annulus. Trileaflet. - Right ventricle: Systolic function was mildly reduced.    ASSESSMENT AND PLAN:  1.  Secondary heart block: Status post Medtronic dual-chamber pacemaker implanted 12/06/2017.***  2.  Coronary artery disease:***  3.  Diastolic heart failure:***    Current medicines are reviewed at length with the patient today.   The patient {ACTIONS; HAS/DOES NOT HAVE:19233} concerns regarding his medicines.  The following changes were made today:  {NONE DEFAULTED:18576::"none"}  Labs/ tests ordered today include: *** No orders of the defined types were placed in this encounter.    Disposition:   FU with Pheonix Clinkscale {gen number 3-22:025427} {Days to years:10300}  Signed, Marietta Sikkema [name], MD  03/16/2018 8:13 AM     [facility] [address] [place] [address] [phone] (office) [phone] (fax)

## 2018-03-21 ENCOUNTER — Other Ambulatory Visit: Payer: Self-pay | Admitting: Cardiovascular Disease

## 2018-03-21 DIAGNOSIS — I2510 Atherosclerotic heart disease of native coronary artery without angina pectoris: Secondary | ICD-10-CM

## 2018-03-21 MED ORDER — ISOSORBIDE MONONITRATE ER 30 MG PO TB24: 30.0000 mg | ORAL_TABLET | Freq: Every day | ORAL | 0 refills | Status: DC

## 2018-03-22 ENCOUNTER — Other Ambulatory Visit: Payer: Self-pay | Admitting: Cardiovascular Disease

## 2018-03-31 ENCOUNTER — Other Ambulatory Visit: Payer: Self-pay

## 2018-03-31 NOTE — Telephone Encounter (Signed)
ASSESSMENT AND PLAN:  1. coronary artery disease-status post CABG 1995, status post DES in June, 2013 Primary MD manages his lipids   2. COPD- due to asthma ( never smoker)  on home O2,   3. Chronic kidney disease stage III  4. Hypertension - BP is well controlled.   5. Chronic combined systolic and diastolic congestive heart failure EF = 45-50%. Is on home O2 Continue current meds.   6. Generalized leg weakness:  Not likely due to a cardiac condition or a medication .  I've advised him to ask his primary medical doctor about this.   Staggers a bit.   Uses a wheelchair on occasion

## 2018-04-21 ENCOUNTER — Other Ambulatory Visit: Payer: Self-pay | Admitting: Cardiovascular Disease

## 2018-04-26 ENCOUNTER — Encounter: Payer: Self-pay | Admitting: Cardiology

## 2018-04-26 ENCOUNTER — Ambulatory Visit: Payer: Medicare Other | Admitting: Cardiology

## 2018-04-26 VITALS — BP 130/66 | HR 83 | Ht 71.0 in | Wt 251.0 lb

## 2018-04-26 DIAGNOSIS — I2510 Atherosclerotic heart disease of native coronary artery without angina pectoris: Secondary | ICD-10-CM | POA: Diagnosis not present

## 2018-04-26 DIAGNOSIS — I5032 Chronic diastolic (congestive) heart failure: Secondary | ICD-10-CM | POA: Diagnosis not present

## 2018-04-26 DIAGNOSIS — I441 Atrioventricular block, second degree: Secondary | ICD-10-CM | POA: Diagnosis not present

## 2018-04-26 LAB — CUP PACEART INCLINIC DEVICE CHECK
Implantable Lead Implant Date: 20190529
Implantable Lead Location: 753860
MDC IDC LEAD IMPLANT DT: 20190529
MDC IDC LEAD LOCATION: 753859
MDC IDC PG IMPLANT DT: 20190529
MDC IDC SESS DTM: 20191009122902

## 2018-04-26 NOTE — Progress Notes (Signed)
Electrophysiology Office Note   Date:  04/26/2018   ID:  [name], DOB 08-Sep-1930, MRN [id number]  PCP:  Lawerance Cruel, MD  Cardiologist:  Nahser Primary Electrophysiologist:  Jazz Rogala [name], MD    No chief complaint on file.    History of Present Illness: [name] is a 84 y.o. male who is being seen today for the evaluation of heart block at the request of Lawerance Cruel, MD. Presenting today for electrophysiology evaluation.  He has a history of coronary artery disease status post CABG in 1995, drug-eluting stent in June 2013, COPD, chronic respiratory failure on home oxygen, CKD stage III, hypertension, morbid obesity, OSA, history of GI bleed.  He presented to the hospital 12/12/2017 after blacking out with no prodrome.  He was found to have Mobitz 1 heart block with brief periods of 2-1 conduction.  He had a Medtronic dual-chamber pacemaker implanted 12/15/2017.   Today, he denies symptoms of palpitations, chest pain, shortness of breath, orthopnea, PND, lower extremity edema, claudication, dizziness, presyncope, syncope, bleeding, or neurologic sequela. The patient is tolerating medications without difficulties.  Overall he is feeling well.  He has not had any further episodes of syncope since his pacemaker was implanted.  He is able to do all of his daily activities without restriction.   Past Medical History:  Diagnosis Date  . Acute blood loss anemia    a. 06/2013 due to GIB.  Marland Kitchen Arthritis    "knees; hands" (07/11/2013)  . Asthma   . CAD (coronary artery disease)    a. s/p CABG x 3 in 1995; b.  NSTEMI 12/2011 -> Cath 6/21: LM 20-30, LAD 90, RI 95, LCX 26m, RCA 90/158m (treated w/ 2.0x16 Promus DES), VG->RCA ok, VG->RI 70-8m (treated w/ 4.0x28 Promus), LIMA->LAD ok, EF 55-60%.  . Chronic diastolic CHF (congestive heart failure) ([place])    a. Echo 10/2011: EF 55-60%;  b. elevated EDP req diuresis.  . Chronic respiratory failure ([place]) 07/11/2013   a. 3L  home O2 24/7.  Marland Kitchen CKD (chronic kidney disease)    a. ?Based on Cr 2013 - 1.3-1.7.  . COPD (chronic obstructive pulmonary disease) ([place])    a. on home O2  . GERD (gastroesophageal reflux disease)   . GI bleed    a. 06/2013 adm - prepyloric ulcer, path neg for malignancy. A/w ABL anemia.  Marland Kitchen HLD (hyperlipidemia)   . HOH (hard of hearing)    left ear  . HTN (hypertension)   . Hypothyroidism   . Morbid obesity ([place])   . Orthopnea    a. Has been sleeping in a recliner x 30 yrs.  . Sleep apnea   . Syncope    Past Surgical History:  Procedure Laterality Date  . [place]   "when I was in the [place]" (07/11/2013)  . CORONARY ANGIOPLASTY WITH STENT PLACEMENT  12/2011  . CORONARY ARTERY BYPASS GRAFT  1995   CABG X3  . ESOPHAGOGASTRODUODENOSCOPY N/A 07/12/2013   Procedure: ESOPHAGOGASTRODUODENOSCOPY (EGD);  Surgeon: Inda Castle, MD;  Location: [facility];  Service: Endoscopy;  Laterality: N/A;  . EXCISIONAL HEMORRHOIDECTOMY     "twice cut them out; burnt them out once"  . FUNCTIONAL ENDOSCOPIC SINUS SURGERY  2006  . HYDROCELE EXCISION  2005  . INGUINAL HERNIA REPAIR Right 2006  . KNEE ARTHROSCOPY Right 1990's  . LEFT HEART CATHETERIZATION WITH CORONARY/GRAFT ANGIOGRAM  01/07/2012   Procedure: LEFT HEART CATHETERIZATION WITH Beatrix Fetters;  Surgeon: Collier Salina  M Martinique, MD;  Location: [facility] C[facility] LAB;  Service: Cardiovascular;;  . PACEMAKER IMPLANT N/A 12/14/2017   Procedure: PACEMAKER IMPLANT;  Surgeon: Constance Haw, MD;  Location: [facility] CV LAB;  Service: Cardiovascular;  Laterality: N/A;  . PERCUTANEOUS CORONARY STENT INTERVENTION (PCI-S) N/A 01/10/2012   Procedure: PERCUTANEOUS CORONARY STENT INTERVENTION (PCI-S);  Surgeon: Peter M Martinique, MD;  Location[facility] C[facility] LAB;  Service: Cardiovascular;  Laterality: N/A;  . PROSTATE SURGERY  1998     Current Outpatient Medications  Medication Sig Dispense Refill  . albuterol (PROVENTIL HFA;VENTOLIN HFA) 108 (90  BASE) MCG/ACT inhaler Inhale 2 puffs into the lungs every 6 (six) hours as needed for wheezing or shortness of breath.    Marland Kitchen albuterol (PROVENTIL) (2.5 MG/3ML) 0.083% nebulizer solution Take 3 mLs (2.5 mg total) by nebulization every 6 (six) hours as needed for wheezing. 75 mL 12  . ARTIFICIAL TEAR OP Place 2 drops into both eyes daily as needed. For dry eyes    . aspirin 81 MG tablet Take 1 tablet (81 mg total) by mouth daily.    Marland Kitchen atorvastatin (LIPITOR) 80 MG tablet Take 1 tablet (80 mg total) by mouth daily at 6 PM. Please make overdue appt with Dr. Acie Fredrickson before anymore refills. 2nd attempt 15 tablet 0  . budesonide-formoterol (SYMBICORT) 160-4.5 MCG/ACT inhaler Inhale 2 puffs into the lungs 2 (two) times daily.    . clopidogrel (PLAVIX) 75 MG tablet Take 1 tablet (75 mg total) by mouth daily with breakfast. Please make overdue appt with Dr. Acie Fredrickson before anymore refills. 3rd attempt 15 tablet 0  . fluticasone (FLONASE) 50 MCG/ACT nasal spray Place 2 sprays into both nostrils daily. 16 g 0  . furosemide (LASIX) 40 MG tablet Take 1.5 tablets (60 mg total) by mouth 2 (two) times daily. 90 tablet 0  . glimepiride (AMARYL) 2 MG tablet Take 2 mg by mouth daily with breakfast.    . isosorbide mononitrate (IMDUR) 30 MG 24 hr tablet Take 1 tablet (30 mg total) by mouth daily. Please make overdue appt with Dr. Acie Fredrickson before anymore refills. 3rd and Final attempt 15 tablet 0  . montelukast (SINGULAIR) 10 MG tablet Take 10 mg by mouth at bedtime.    . Multiple Vitamins-Minerals (MULTIVITAMIN & MINERAL PO) Take 1 tablet by mouth daily.      . nitroGLYCERIN (NITROSTAT) 0.4 MG SL tablet Place 1 tablet (0.4 mg total) under the tongue every 5 (five) minutes as needed for chest pain. 25 tablet 0  . pantoprazole (PROTONIX) 40 MG tablet Take 1 tablet (40 mg total) by mouth daily. Patient overdue for an appointment. Needs to call and schedule for further refills 2nd attempt 15 tablet 0  . potassium chloride SA  (K-DUR,KLOR-CON) 20 MEQ tablet Take 1 tablet (20 mEq total) by mouth 2 (two) times daily. Please make overdue appt with Dr.Nahser. 3rd and Final attempt 30 tablet 0  . tiotropium (SPIRIVA) 18 MCG inhalation capsule Place 1 capsule (18 mcg total) into inhaler and inhale daily. 30 capsule 12   No current facility-administered medications for this visit.     Allergies:   Other   Social History:  The patient  reports that he has never smoked. He has never used smokeless tobacco. He reports that he drinks alcohol. He reports that he does not use drugs.   Family History:  The patient's family history includes CAD (age of onset: 64) in his father.    ROS:  Please see the history of  present illness.   Otherwise, review of systems is positive for none.   All other systems are reviewed and negative.    PHYSICAL EXAM: VS:  BP 130/66   Pulse 83   Ht 5\' 11"  (1.803 m)   Wt 251 lb (113.9 kg)   BMI 35.01 kg/m  , BMI Body mass index is 35.01 kg/m. GEN: Well nourished, well developed, in no acute distress  HEENT: normal  Neck: no JVD, carotid bruits, or masses Cardiac: RRR; no murmurs, rubs, or gallops,no edema  Respiratory:  clear to auscultation bilaterally, normal work of breathing GI: soft, nontender, nondistended, + BS MS: no deformity or atrophy  Skin: warm and dry, device pocket is well healed Neuro:  Strength and sensation are intact Psych: euthymic mood, full affect  EKG:  EKG is ordered today. Personal review of the ekg ordered shows sinus rhythm, V paced  Device interrogation is reviewed today in detail.  See PaceArt for details.   Recent Labs: 12/13/2017: TSH 2.534 12/14/2017: BUN 21; Creatinine, Ser 1.62; Hemoglobin 12.7; Platelets 168; Potassium 3.7; Sodium 141    Lipid Panel     Component Value Date/Time   CHOL 118 12/13/2017 0442   TRIG 82 12/13/2017 0442   HDL 41 12/13/2017 0442   CHOLHDL 2.9 12/13/2017 0442   VLDL 16 12/13/2017 0442   LDLCALC 61 12/13/2017 0442      Wt Readings from Last 3 Encounters:  04/26/18 251 lb (113.9 kg)  12/15/17 266 lb 15.6 oz (121.1 kg)  09/12/17 258 lb 13.1 oz (117.4 kg)      Other studies Reviewed: Additional studies/ records that were reviewed today include: TTE 12/14/17  Review of the above records today demonstrates:  - Left ventricle: The cavity size was normal. Wall thickness was   normal. Systolic function was normal. The estimated ejection   fraction was in the range of 60% to 65%. Wall motion was normal;   there were no regional wall motion abnormalities. Doppler   parameters are consistent with abnormal left ventricular   relaxation (grade 1 diastolic dysfunction). The E/e&' ratio is   between 8-15, suggesting indeterminate LV filling pressure. - Mitral valve: Mildly thickened leaflets . There was trivial   regurgitation. - Left atrium: The atrium was mildly dilated.   ASSESSMENT AND PLAN:  1.  Second-degree AV block: Status post Medtronic dual-chamber pacemaker implanted 12/06/2017.  Device functioning appropriately.  No changes.  2.  Coronary artery disease: No current chest pain.  Continue with current management per primary cardiology  3.  Chronic heart failure: Action fraction is improved.  No signs of volume overload.  Has grade 1 diastolic dysfunction.  4.  COPD: Currently on home oxygen.  Plan per primary physician    Current medicines are reviewed at length with the patient today.   The patient does not have concerns regarding his medicines.  The following changes were made today:  none  Labs/ tests ordered today include:  Orders Placed This Encounter  Procedures  . EKG 12-Lead     Disposition:   FU with Pratik Dalziel 9 months  Signed, Nidal Rivet [name], MD  04/26/2018 12:09 PM     [address] [place] [id number] [phone] (office) [phone] (fax)

## 2018-04-26 NOTE — Patient Instructions (Signed)
Medication Instructions:  Your physician recommends that you continue on your current medications as directed. Please refer to the Current Medication list given to you today.  If you need a refill on your cardiac medications before your next appointment, please call your pharmacy.   Lab work: None ordered  Testing/Procedures: None ordered  Follow-Up: Remote monitoring is used to monitor your Pacemaker of ICD from home. This monitoring reduces the number of office visits required to check your device to one time per year. It allows [place] to keep an eye on the functioning of your device to ensure it is working properly. You are scheduled for a device check from home on 07/26/2018. You may send your transmission at any time that day. If you have a wireless device, the transmission will be sent automatically. After your physician reviews your transmission, you will receive a postcard with your next transmission date.  At [facility], you and your health needs are our priority.  As part of our continuing mission to provide you with exceptional heart care, we have created designated Provider Care Teams.  These Care Teams include your primary Cardiologist (physician) and Advanced Practice Providers (APPs -  Physician Assistants and Nurse Practitioners) who all work together to provide you with the care you need, when you need it. You will need a follow up appointment in 9 months.  Please call our office 2 months in advance to schedule this appointment.  You may see Will [name], MD or one of the following Advanced Practice Providers on your designated Care Team:   Chanetta Marshall, NP . [name], PA-C  Thank you for choosing [facility]!!   Trinidad Curet, RN [phone]

## 2018-07-25 ENCOUNTER — Other Ambulatory Visit: Payer: Self-pay

## 2018-07-25 ENCOUNTER — Encounter (HOSPITAL_COMMUNITY): Payer: Self-pay | Admitting: Emergency Medicine

## 2018-07-25 ENCOUNTER — Emergency Department (HOSPITAL_COMMUNITY)
Admission: EM | Admit: 2018-07-25 | Discharge: 2018-07-26 | Disposition: A | Discharge status: Home / Self Care | Payer: Medicare Other | Attending: Emergency Medicine | Admitting: Emergency Medicine

## 2018-07-25 ENCOUNTER — Emergency Department (HOSPITAL_COMMUNITY): Payer: Medicare Other

## 2018-07-25 DIAGNOSIS — Z7901 Long term (current) use of anticoagulants: Secondary | ICD-10-CM | POA: Insufficient documentation

## 2018-07-25 DIAGNOSIS — R05 Cough: Secondary | ICD-10-CM | POA: Insufficient documentation

## 2018-07-25 DIAGNOSIS — E039 Hypothyroidism, unspecified: Secondary | ICD-10-CM | POA: Insufficient documentation

## 2018-07-25 DIAGNOSIS — I2510 Atherosclerotic heart disease of native coronary artery without angina pectoris: Secondary | ICD-10-CM | POA: Insufficient documentation

## 2018-07-25 DIAGNOSIS — Z951 Presence of aortocoronary bypass graft: Secondary | ICD-10-CM | POA: Diagnosis not present

## 2018-07-25 DIAGNOSIS — R441 Visual hallucinations: Secondary | ICD-10-CM | POA: Diagnosis present

## 2018-07-25 DIAGNOSIS — R410 Disorientation, unspecified: Secondary | ICD-10-CM | POA: Insufficient documentation

## 2018-07-25 DIAGNOSIS — R220 Localized swelling, mass and lump, head: Secondary | ICD-10-CM | POA: Diagnosis not present

## 2018-07-25 DIAGNOSIS — R443 Hallucinations, unspecified: Secondary | ICD-10-CM

## 2018-07-25 DIAGNOSIS — N183 Chronic kidney disease, stage 3 (moderate): Secondary | ICD-10-CM | POA: Diagnosis not present

## 2018-07-25 DIAGNOSIS — I129 Hypertensive chronic kidney disease with stage 1 through stage 4 chronic kidney disease, or unspecified chronic kidney disease: Secondary | ICD-10-CM | POA: Insufficient documentation

## 2018-07-25 DIAGNOSIS — F23 Brief psychotic disorder: Secondary | ICD-10-CM | POA: Insufficient documentation

## 2018-07-25 DIAGNOSIS — J3489 Other specified disorders of nose and nasal sinuses: Secondary | ICD-10-CM

## 2018-07-25 LAB — URINALYSIS, ROUTINE W REFLEX MICROSCOPIC
Bilirubin Urine: NEGATIVE
Glucose, UA: NEGATIVE mg/dL
Hgb urine dipstick: NEGATIVE
Ketones, ur: NEGATIVE mg/dL
Leukocytes, UA: NEGATIVE
Nitrite: NEGATIVE
Protein, ur: NEGATIVE mg/dL
Specific Gravity, Urine: 1.01 (ref 1.005–1.030)
pH: 7 (ref 5.0–8.0)

## 2018-07-25 LAB — HEPATIC FUNCTION PANEL
ALT: 24 U/L (ref 0–44)
AST: 25 U/L (ref 15–41)
Albumin: 3.1 g/dL — ABNORMAL LOW (ref 3.5–5.0)
Alkaline Phosphatase: 69 U/L (ref 38–126)
Bilirubin, Direct: 0.1 mg/dL (ref 0.0–0.2)
Indirect Bilirubin: 0.5 mg/dL (ref 0.3–0.9)
Total Bilirubin: 0.6 mg/dL (ref 0.3–1.2)
Total Protein: 7.1 g/dL (ref 6.5–8.1)

## 2018-07-25 LAB — CBC
HCT: 45.7 % (ref 39.0–52.0)
Hemoglobin: 13.6 g/dL (ref 13.0–17.0)
MCH: 28.4 pg (ref 26.0–34.0)
MCHC: 29.8 g/dL — ABNORMAL LOW (ref 30.0–36.0)
MCV: 95.4 fL (ref 80.0–100.0)
PLATELETS: 199 10*3/uL (ref 150–400)
RBC: 4.79 MIL/uL (ref 4.22–5.81)
RDW: 14.5 % (ref 11.5–15.5)
WBC: 9 10*3/uL (ref 4.0–10.5)
nRBC: 0 % (ref 0.0–0.2)

## 2018-07-25 LAB — CBG MONITORING, ED
Glucose-Capillary: 137 mg/dL — ABNORMAL HIGH (ref 70–99)
Glucose-Capillary: 129 mg/dL — ABNORMAL HIGH (ref 70–99)

## 2018-07-25 LAB — AMMONIA: AMMONIA: 34 umol/L (ref 9–35)

## 2018-07-25 LAB — BASIC METABOLIC PANEL
Anion gap: 9 (ref 5–15)
BUN: 19 mg/dL (ref 8–23)
CALCIUM: 8.7 mg/dL — AB (ref 8.9–10.3)
CO2: 34 mmol/L — ABNORMAL HIGH (ref 22–32)
Chloride: 95 mmol/L — ABNORMAL LOW (ref 98–111)
Creatinine, Ser: 1.52 mg/dL — ABNORMAL HIGH (ref 0.61–1.24)
GFR calc Af Amer: 48 mL/min — ABNORMAL LOW (ref >60)
GFR calc non Af Amer: 41 mL/min — ABNORMAL LOW (ref >60)
GLUCOSE: 166 mg/dL — AB (ref 70–99)
Potassium: 4 mmol/L (ref 3.5–5.1)
Sodium: 138 mmol/L (ref 135–145)

## 2018-07-25 LAB — I-STAT ARTERIAL BLOOD GAS, ED
Acid-Base Excess: 9 mmol/L — ABNORMAL HIGH (ref 0.0–2.0)
Bicarbonate: 37.9 mmol/L — ABNORMAL HIGH (ref 20.0–28.0)
O2 SAT: 94 %
PCO2 ART: 72 mmHg — AB (ref 32.0–48.0)
TCO2: 40 mmol/L — ABNORMAL HIGH (ref 22–32)
pH, Arterial: 7.33 — ABNORMAL LOW (ref 7.350–7.450)
pO2, Arterial: 79 mmHg — ABNORMAL LOW (ref 83.0–108.0)

## 2018-07-25 MED ORDER — ONDANSETRON HCL 4 MG PO TABS: 4.0000 mg | ORAL_TABLET | Freq: Three times a day (TID) | ORAL | Status: DC | PRN

## 2018-07-25 MED ORDER — MULTIVITAMIN & MINERAL PO LIQD: Freq: Every day | ORAL | Status: DC

## 2018-07-25 MED ORDER — ISOSORBIDE MONONITRATE ER 30 MG PO TB24
30.0000 mg | ORAL_TABLET | Freq: Every day | ORAL | Status: DC
  Administered 2018-07-25 – 2018-07-26 (×2): 30 mg via ORAL
  Filled 2018-07-25 (×2): qty 1

## 2018-07-25 MED ORDER — GLIMEPIRIDE 2 MG PO TABS
2.0000 mg | ORAL_TABLET | Freq: Every day | ORAL | Status: DC
  Administered 2018-07-26: 2 mg via ORAL
  Filled 2018-07-25: qty 1

## 2018-07-25 MED ORDER — POTASSIUM CHLORIDE CRYS ER 20 MEQ PO TBCR
20.0000 meq | EXTENDED_RELEASE_TABLET | Freq: Two times a day (BID) | ORAL | Status: DC
  Administered 2018-07-25 – 2018-07-26 (×2): 20 meq via ORAL
  Filled 2018-07-25 (×2): qty 1

## 2018-07-25 MED ORDER — SODIUM CHLORIDE 0.9 % IV BOLUS
1000.0000 mL | Freq: Once | INTRAVENOUS | Status: AC
  Administered 2018-07-25: 1000 mL via INTRAVENOUS

## 2018-07-25 MED ORDER — FUROSEMIDE 20 MG PO TABS
60.0000 mg | ORAL_TABLET | Freq: Two times a day (BID) | ORAL | Status: DC
  Administered 2018-07-26: 60 mg via ORAL
  Filled 2018-07-25 (×2): qty 3

## 2018-07-25 MED ORDER — FLUTICASONE PROPIONATE 50 MCG/ACT NA SUSP
2.0000 | Freq: Every day | NASAL | Status: DC
  Administered 2018-07-26: 2 via NASAL
  Filled 2018-07-25: qty 16

## 2018-07-25 MED ORDER — ALUM & MAG HYDROXIDE-SIMETH 200-200-20 MG/5ML PO SUSP: 30.0000 mL | Freq: Four times a day (QID) | ORAL | Status: DC | PRN

## 2018-07-25 MED ORDER — ADULT MULTIVITAMIN W/MINERALS CH
1.0000 | ORAL_TABLET | Freq: Every day | ORAL | Status: DC
  Administered 2018-07-26: 1 via ORAL
  Filled 2018-07-25: qty 1

## 2018-07-25 MED ORDER — CLOPIDOGREL BISULFATE 75 MG PO TABS
75.0000 mg | ORAL_TABLET | Freq: Every day | ORAL | Status: DC
  Administered 2018-07-26: 75 mg via ORAL
  Filled 2018-07-25: qty 1

## 2018-07-25 MED ORDER — ALBUTEROL SULFATE (2.5 MG/3ML) 0.083% IN NEBU
2.5000 mg | INHALATION_SOLUTION | Freq: Four times a day (QID) | RESPIRATORY_TRACT | Status: DC | PRN
  Administered 2018-07-25 – 2018-07-26 (×2): 2.5 mg via RESPIRATORY_TRACT
  Filled 2018-07-25 (×2): qty 3

## 2018-07-25 MED ORDER — PANTOPRAZOLE SODIUM 40 MG PO TBEC
40.0000 mg | DELAYED_RELEASE_TABLET | Freq: Every day | ORAL | Status: DC
  Administered 2018-07-25 – 2018-07-26 (×2): 40 mg via ORAL
  Filled 2018-07-25 (×2): qty 1

## 2018-07-25 MED ORDER — ATORVASTATIN CALCIUM 80 MG PO TABS: 80.0000 mg | ORAL_TABLET | Freq: Every day | ORAL | Status: DC

## 2018-07-25 MED ORDER — ASPIRIN EC 81 MG PO TBEC
81.0000 mg | DELAYED_RELEASE_TABLET | Freq: Every day | ORAL | Status: DC
  Administered 2018-07-25 – 2018-07-26 (×2): 81 mg via ORAL
  Filled 2018-07-25 (×2): qty 1

## 2018-07-25 MED ORDER — ACETAMINOPHEN 325 MG PO TABS: 650.0000 mg | ORAL_TABLET | ORAL | Status: DC | PRN

## 2018-07-25 MED ORDER — SODIUM CHLORIDE 0.9 % IV SOLN
INTRAVENOUS | Status: DC
  Administered 2018-07-25: 19:00:00 via INTRAVENOUS

## 2018-07-25 MED ORDER — MONTELUKAST SODIUM 10 MG PO TABS
10.0000 mg | ORAL_TABLET | Freq: Every day | ORAL | Status: DC
  Administered 2018-07-25: 10 mg via ORAL
  Filled 2018-07-25 (×2): qty 1

## 2018-07-25 MED ORDER — ALBUTEROL SULFATE HFA 108 (90 BASE) MCG/ACT IN AERS: 2.0000 | INHALATION_SPRAY | Freq: Four times a day (QID) | RESPIRATORY_TRACT | Status: DC | PRN

## 2018-07-25 MED ORDER — MOMETASONE FURO-FORMOTEROL FUM 200-5 MCG/ACT IN AERO
2.0000 | INHALATION_SPRAY | Freq: Two times a day (BID) | RESPIRATORY_TRACT | Status: DC
  Administered 2018-07-25 – 2018-07-26 (×2): 2 via RESPIRATORY_TRACT
  Filled 2018-07-25 (×2): qty 8.8

## 2018-07-25 NOTE — ED Notes (Signed)
Patient transported to CT 

## 2018-07-25 NOTE — ED Provider Notes (Signed)
[facility] EMERGENCY DEPARTMENT Provider Note   CSN: [id number] Arrival date & time: 07/25/18  1508     History   Chief Complaint Chief Complaint  Patient presents with  . Hallucinations  . Cough    HPI EUGUNE SINE is a 85 y.o. male.  Pt presents to the ED today with confusion.  The pt lives alone and family has been trying to get him into a SNF.  On Thursday, 1/2, he called the police saying there were people in his home.  They came to check, and there was no one there.  The pt's daughter said he has developed a cough.  She took him to pt's pcp office today who felt that pt may have pna.  They told her to bring him here for eval and possible admission.  Pt has not had a fever.  He is supposed to wear 3L O2, but does not always wear his oxygen as he is supposed to do.  The pt is also supposed to walk with a walker and does not always do it.  He denies any recent falls.  Daughter is not aware of any recent falls.  The pt is on Plavix.  Pt denies any pain.     Past Medical History:  Diagnosis Date  . Acute blood loss anemia    a. 06/2013 due to GIB.  Marland Kitchen Arthritis    "knees; hands" (07/11/2013)  . Asthma   . CAD (coronary artery disease)    a. s/p CABG x 3 in 1995; b.  NSTEMI 12/2011 -> Cath 6/21: LM 20-30, LAD 90, RI 95, LCX 67m, RCA 90/138m (treated w/ 2.0x16 Promus DES), VG->RCA ok, VG->RI 70-46m (treated w/ 4.0x28 Promus), LIMA->LAD ok, EF 55-60%.  . Chronic diastolic CHF (congestive heart failure) ([place])    a. Echo 10/2011: EF 55-60%;  b. elevated EDP req diuresis.  . Chronic respiratory failure ([place]) 07/11/2013   a. 3L home O2 24/7.  Marland Kitchen CKD (chronic kidney disease)    a. ?Based on Cr 2013 - 1.3-1.7.  . COPD (chronic obstructive pulmonary disease) ([place])    a. on home O2  . GERD (gastroesophageal reflux disease)   . GI bleed    a. 06/2013 adm - prepyloric ulcer, path neg for malignancy. A/w ABL anemia.  Marland Kitchen HLD (hyperlipidemia)   . HOH (hard of hearing)      left ear  . HTN (hypertension)   . Hypothyroidism   . Morbid obesity ([place])   . Orthopnea    a. Has been sleeping in a recliner x 30 yrs.  . Sleep apnea   . Syncope     Patient Active Problem List   Diagnosis Date Noted  . Second degree AV block   . 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 GERD (gastroesophageal reflux disease) 12/12/2017  . Fall 12/12/2017  . COPD with acute exacerbation ([place]) 09/12/2017  . Chronic combined systolic and diastolic CHF (congestive heart failure) ([place]) 01/29/2016  . History of recent fall 10/17/2015  . Risk for falls 10/17/2015  . Recurrent falls 10/17/2015  . Chest pain at rest   . DNR (do not resuscitate) 10/23/2014  . Thrush, oral 10/10/2014  . Sinusitis, chronic 10/07/2014  . Bronchitis, acute 10/06/2014  . Acute kidney injury ([place])   . Atrial fibrillation ([place])   . Acute on chronic diastolic heart failure ([place]) 10/04/2014  . Chest pain 10/04/2014  . New onset atrial fibrillation ([place]) 10/04/2014  . COPD exacerbation ([place]) 10/04/2014  .  COLD (chronic obstructive lung disease) ([place])   . 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 GI bleed   . Acute on chronic diastolic CHF (congestive heart failure) ([place]) 08/07/2013  . COPD (chronic obstructive pulmonary disease) ([place]) 08/07/2013  . CKD (chronic kidney disease), stage III ([place]) 08/07/2013  . Acute blood loss anemia   . Elevated serum creatinine 07/11/2013  . Chronic respiratory failure ([place]) 07/11/2013  . Chronic diastolic CHF (congestive heart failure) ([place])   . CAD (coronary artery disease)   . HTN (hypertension)   . HLD (hyperlipidemia)   . History of acute myocardial infarction 01/06/2012  . Type II diabetes mellitus with renal manifestations ([place]) 01/06/2012  . Obstructive chronic bronchitis without exacerbation ([place]) 10/16/2010  . Syncope 08/06/2010  . DIZZINESS 08/06/2010  . CAD, NATIVE VESSEL 01/27/2010  . CAD, ARTERY BYPASS GRAFT 01/27/2010  . OBESITY 01/24/2009    Past  Surgical History:  Procedure Laterality Date  . [place]   "when I was in the [facility]" (07/11/2013)  . CORONARY ANGIOPLASTY WITH STENT PLACEMENT  12/2011  . CORONARY ARTERY BYPASS GRAFT  1995   CABG X3  . ESOPHAGOGASTRODUODENOSCOPY N/A 07/12/2013   Procedure: ESOPHAGOGASTRODUODENOSCOPY (EGD);  Surgeon: Inda Castle, MD;  Location: [facility];  Service: Endoscopy;  Laterality: N/A;  . EXCISIONAL HEMORRHOIDECTOMY     "twice cut them out; burnt them out once"  . FUNCTIONAL ENDOSCOPIC SINUS SURGERY  2006  . HYDROCELE EXCISION  2005  . INGUINAL HERNIA REPAIR Right 2006  . KNEE ARTHROSCOPY Right 1990's  . LEFT HEART CATHETERIZATION WITH CORONARY/GRAFT ANGIOGRAM  01/07/2012   Procedure: LEFT HEART CATHETERIZATION WITH Beatrix Fetters;  Surgeon: Peter M Martinique, MD;  Location: [facility] CATH LAB;  Service: Cardiovascular;;  . PACEMAKER IMPLANT N/A 12/14/2017   Procedure: PACEMAKER IMPLANT;  Surgeon: Constance Haw, MD;  Location: [facility] CV LAB;  Service: Cardiovascular;  Laterality: N/A;  . PERCUTANEOUS CORONARY STENT INTERVENTION (PCI-S) N/A 01/10/2012   Procedure: PERCUTANEOUS CORONARY STENT INTERVENTION (PCI-S);  Surgeon: Peter M Martinique, MD;  Location: [facility] CATH LAB;  Service: Cardiovascular;  Laterality: N/A;  . [place] Medications    Prior to Admission medications   Medication Sig Start Date End Date Taking? Authorizing Provider  albuterol (PROVENTIL HFA;VENTOLIN HFA) 108 (90 BASE) MCG/ACT inhaler Inhale 2 puffs into the lungs every 6 (six) hours as needed for wheezing or shortness of breath.   Yes [provider]  albuterol (PROVENTIL) (2.5 MG/3ML) 0.083% nebulizer solution Take 3 mLs (2.5 mg total) by nebulization every 6 (six) hours as needed for wheezing. 07/05/13  Yes Bonnielee Haff, MD  ARTIFICIAL TEAR OP Place 2 drops into both eyes daily as needed. For dry eyes   Yes [provider]  aspirin 81 MG tablet Take 1  tablet (81 mg total) by mouth daily. 08/10/13  Yes Dunn, Dayna N, PA-C  atorvastatin (LIPITOR) 80 MG tablet Take 1 tablet (80 mg total) by mouth daily at 6 PM. Please make overdue appt with Dr. Acie Fredrickson before anymore refills. 2nd attempt 01/31/18  Yes Nahser, Wonda Cheng, MD  budesonide-formoterol [facility]) 160-4.5 MCG/ACT inhaler Inhale 2 puffs into the lungs 2 (two) times daily.   Yes [provider]  clopidogrel (PLAVIX) 75 MG tablet Take 1 tablet (75 mg total) by mouth daily with breakfast. Please make overdue appt with Dr. Acie Fredrickson before anymore refills. 3rd attempt 04/21/18  Yes Nahser, Wonda Cheng, MD  fluticasone [facility]) 50  MCG/ACT nasal spray Place 2 sprays into both nostrils daily. 10/13/14  Yes Short, Noah Delaine, MD  furosemide (LASIX) 40 MG tablet Take 1.5 tablets (60 mg total) by mouth 2 (two) times daily. 06/19/15  Yes Nahser, Wonda Cheng, MD  glimepiride (AMARYL) 2 MG tablet Take 2 mg by mouth daily with breakfast.   Yes Lawerance Cruel, MD  isosorbide mononitrate (IMDUR) 30 MG 24 hr tablet Take 1 tablet (30 mg total) by mouth daily. Please make overdue appt with Dr. Acie Fredrickson before anymore refills. 3rd and Final attempt 03/21/18  Yes Nahser, Wonda Cheng, MD  montelukast (SINGULAIR) 10 MG tablet Take 10 mg by mouth at bedtime.   Yes [provider]  Multiple Vitamins-Minerals (MULTIVITAMIN & MINERAL PO) Take 1 tablet by mouth daily.     Yes [provider]  nitroGLYCERIN (NITROSTAT) 0.4 MG SL tablet Place 1 tablet (0.4 mg total) under the tongue every 5 (five) minutes as needed for chest pain. 11/02/17  Yes Nahser, Wonda Cheng, MD  pantoprazole (PROTONIX) 40 MG tablet Take 1 tablet (40 mg total) by mouth daily. Patient overdue for an appointment. Needs to call and schedule for further refills 2nd attempt 11/02/17  Yes Nahser, Wonda Cheng, MD  potassium chloride SA (K-DUR,KLOR-CON) 20 MEQ tablet Take 1 tablet (20 mEq total) by mouth 2 (two) times daily. Please make overdue appt with  Dr.Nahser. 3rd and Final attempt 12/22/17  Yes Nahser, Wonda Cheng, MD  tiotropium (SPIRIVA) 18 MCG inhalation capsule Place 1 capsule (18 mcg total) into inhaler and inhale daily. Patient not taking: Reported on 07/25/2018 07/05/13   Bonnielee Haff, MD    Family History Family History  Problem Relation Age of Onset  . CAD Father 39    Social History Social History   Tobacco Use  . Smoking status: Never Smoker  . Smokeless tobacco: Never Used  Substance Use Topics  . Alcohol use: Yes    Comment: prior alcohol, no sig use now  . Drug use: No     Allergies   Other   Review of Systems Review of Systems  Respiratory: Positive for cough.   Psychiatric/Behavioral: Positive for hallucinations.  All other systems reviewed and are negative.    Physical Exam Updated Vital Signs BP (!) 160/119   Pulse 89   Temp 97.6 F (36.4 C) (Oral)   Resp (!) 22   SpO2 100%   Physical Exam Vitals signs and nursing note reviewed.  Constitutional:      Appearance: Normal appearance. He is normal weight.  HENT:     Head: Normocephalic and atraumatic.     Right Ear: External ear normal.     Left Ear: External ear normal.     Nose: Nose normal.     Mouth/Throat:     Mouth: Mucous membranes are moist.  Eyes:     Comments: Right eye blind  Neck:     Musculoskeletal: Normal range of motion.  Cardiovascular:     Rate and Rhythm: Normal rate and regular rhythm.     Pulses: Normal pulses.     Heart sounds: Normal heart sounds.  Pulmonary:     Effort: Pulmonary effort is normal.     Breath sounds: Normal breath sounds.  Abdominal:     General: Abdomen is flat. Bowel sounds are normal.     Palpations: Abdomen is soft.  Musculoskeletal: Normal range of motion.  Skin:    General: Skin is warm and dry.     Capillary Refill: Capillary  refill takes less than 2 seconds.  Neurological:     General: No focal deficit present.     Mental Status: He is alert and oriented to person, place, and  time.  Psychiatric:        Mood and Affect: Mood normal.        Behavior: Behavior normal.      ED Treatments / Results  Labs (all labs ordered are listed, but only abnormal results are displayed) Labs Reviewed  BASIC METABOLIC PANEL - Abnormal; Notable for the following components:      Result Value   Chloride 95 (*)    CO2 34 (*)    Glucose, Bld 166 (*)    Creatinine, Ser 1.52 (*)    Calcium 8.7 (*)    GFR calc non Af Amer 41 (*)    GFR calc Af Amer 48 (*)    All other components within normal limits  CBC - Abnormal; Notable for the following components:   MCHC 29.8 (*)    All other components within normal limits  HEPATIC FUNCTION PANEL - Abnormal; Notable for the following components:   Albumin 3.1 (*)    All other components within normal limits  CBG MONITORING, ED - Abnormal; Notable for the following components:   Glucose-Capillary 137 (*)    All other components within normal limits  I-STAT ARTERIAL BLOOD GAS, ED - Abnormal; Notable for the following components:   pH, Arterial 7.330 (*)    pCO2 arterial 72.0 (*)    pO2, Arterial 79.0 (*)    Bicarbonate 37.9 (*)    TCO2 40 (*)    Acid-Base Excess 9.0 (*)    All other components within normal limits  [facility]IA  URINALYSIS, ROUTINE W REFLEX MICROSCOPIC  CBG MONITORING, ED    EKG None  Radiology Dg Chest 2 View  Result Date: 07/25/2018 CLINICAL DATA:  Cough and confusion EXAM: CHEST - 2 VIEW COMPARISON:  12/15/2017 FINDINGS: Post sternotomy changes. Left-sided pacing device as before. Streaky atelectasis or scar at the lingula. No focal consolidation or effusion. Cardiomediastinal silhouette within normal limits. Aortic atherosclerosis. No pneumothorax. IMPRESSION: 1. Streaky atelectasis or scar at the lingula. No focal pulmonary infiltrate. Electronically Signed   By: Donavan Foil M.D.   On: 07/25/2018 16:05   Ct Head Wo Contrast  Result Date: 07/25/2018 CLINICAL DATA:  Altered LOC EXAM: CT HEAD WITHOUT  CONTRAST TECHNIQUE: Contiguous axial images were obtained from the base of the skull through the vertex without intravenous contrast. COMPARISON:  12/12/2017, 10/02/2014 sinus CT FINDINGS: Brain: No acute territorial infarction, hemorrhage or intracranial mass. Mild small vessel ischemic changes of the white matter. Stable ventricle size. Vascular: No hyperdense vessels.  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 Mass has increased in size since 12/12/2017. Mucosal thickening is present in the left maxillary sinus and ethmoid sinuses. Other: None IMPRESSION: 1. No CT evidence for acute intracranial abnormality. 2. Further growth of polypoid mass within the right maxillary and ethmoid sinuses, extending into the pharyngeal airway. Surgical consultation was previously recommended. Electronically Signed   By: Donavan Foil M.D.   On: 07/25/2018 18:59    Procedures Procedures (including critical care time)  Medications Ordered in ED Medications  sodium chloride 0.9 % bolus 1,000 mL (0 mLs Intravenous Stopped 07/25/18 1756)    And  0.9 %  sodium chloride infusion ( Intravenous New Bag/Given 07/25/18 1902)  acetaminophen (TYLENOL) tablet 650 mg (has no administration in time range)  ondansetron (ZOFRAN) tablet 4 mg (has no administration in time range)  alum & mag hydroxide-simeth (MAALOX/MYLANTA) 200-200-20 MG/5ML suspension 30 mL (has no administration in time range)  albuterol (PROVENTIL HFA;VENTOLIN HFA) 108 (90 Base) MCG/ACT inhaler 2 puff (has no administration in time range)  albuterol (PROVENTIL) (2.5 MG/3ML) 0.083% nebulizer solution 2.5 mg (has no administration in time range)  aspirin EC tablet 81 mg (has no administration in time range)  atorvastatin (LIPITOR) tablet 80 mg (has no administration in time range)  mometasone-formoterol (DULERA) 200-5 MCG/ACT inhaler 2  puff (has no administration in time range)  clopidogrel (PLAVIX) tablet 75 mg (has no administration in time range)  fluticasone (FLONASE) 50 MCG/ACT nasal spray 2 spray (has no administration in time range)  furosemide (LASIX) tablet 60 mg (has no administration in time range)  glimepiride (AMARYL) tablet 2 mg (has no administration in time range)  isosorbide mononitrate (IMDUR) 24 hr tablet 30 mg (has no administration in time range)  montelukast (SINGULAIR) tablet 10 mg (has no administration in time range)  pantoprazole (PROTONIX) EC tablet 40 mg (has no administration in time range)  potassium chloride SA (K-DUR,KLOR-CON) CR tablet 20 mEq (has no administration in time range)  multivitamin with minerals tablet 1 tablet (has no administration in time range)     Initial Impression / Assessment and Plan / ED Course  I have reviewed the triage vital signs and the nursing notes.  Pertinent labs & imaging results that were available during my care of the patient were reviewed by me and considered in my medical decision making (see chart for details).   No medical etiology for hallucinations.  He was ambulated with a walker and was 100% with movement.   Pt is seeing turkeys in the room now.  He told his daughter that the ambulance picked up 6 of the people at his house and that she's not to go there by herself.  She thinks he has not been sleeping b/c of the hallucinations.    I will get a TTS consult.  Pt may need inpatient at geropsych for stabilization.  Final Clinical Impressions(s) / ED Diagnoses   Final diagnoses:  Hallucinations    ED Discharge Orders    None       Isla Pence, MD 07/25/18 2113

## 2018-07-25 NOTE — ED Triage Notes (Signed)
Pt sent by PCP for reports of cough and hallucinations since Thursday. Per family PCP wanted him to come in for admission. Congested cough heard in triage. Pt wears 3L of O2 at home at baseline. Family say that patient has been stating that there are people in the house and staring at the ceiling, A&O x 4

## 2018-07-25 NOTE — BH Assessment (Addendum)
Assessment Note  [name] is an 85 y.o. male.  The pt stated he came in due to having falls.  According to the pt's daughter, the pt has been having hallucinations. On 07/20/2018 pt called the police and stated there were people in his home.  The police didn't find anything in the home.  The pt stated there are people coming in his house and taking things.  He told his daughter not to go to the house alone because of the people at the house. The daughter also stated there were not other people in the home.  While at the hospital, the pt stated he was seeing turkeys in the hospital room.  The pt's daughter stated she was concerned the hallucinations were due to a lack of oxygen. The pt uses oxygen.  The pt denies SI and HI.  He denies any previous mental health treatment.  The pt lives alone.  He denies self harm and legal issues.  The pt stated he has been sleeping about 2 hours a night for the past few nights.  The pt stated he eats well.  He denies symptoms of depression.   Pt is dressed in a hospital gown. He is alert and oriented x4. Pt speaks in a clear tone, at a loud volume and normal pace.  The pt has problems with his hearing Eye contact is good. Pt's mood is pleasant. Thought process is coherent and relevant.  Pt was cooperative throughout assessment.    Diagnosis: F23 Brief psychotic disorder   Past Medical History:  Past Medical History:  Diagnosis Date  . Acute blood loss anemia    a. 06/2013 due to GIB.  Marland Kitchen Arthritis    "knees; hands" (07/11/2013)  . Asthma   . CAD (coronary artery disease)    a. s/p CABG x 3 in 1995; b.  NSTEMI 12/2011 -> Cath 6/21: LM 20-30, LAD 90, RI 95, LCX 43m, RCA 90/159m (treated w/ 2.0x16 Promus DES), VG->RCA ok, VG->RI 70-22m (treated w/ 4.0x28 Promus), LIMA->LAD ok, EF 55-60%.  . Chronic diastolic CHF (congestive heart failure) ([place])    a. Echo 10/2011: EF 55-60%;  b. elevated EDP req diuresis.  . Chronic respiratory failure ([place]) 07/11/2013   a.  3L home O2 24/7.  Marland Kitchen CKD (chronic kidney disease)    a. ?Based on Cr 2013 - 1.3-1.7.  . COPD (chronic obstructive pulmonary disease) ([place])    a. on home O2  . GERD (gastroesophageal reflux disease)   . GI bleed    a. 06/2013 adm - prepyloric ulcer, path neg for malignancy. A/w ABL anemia.  Marland Kitchen HLD (hyperlipidemia)   . HOH (hard of hearing)    left ear  . HTN (hypertension)   . Hypothyroidism   . Morbid obesity ([place])   . Orthopnea    a. Has been sleeping in a recliner x 30 yrs.  . Sleep apnea   . Syncope     Past Surgical History:  Procedure Laterality Date  . [name]   "when I was in the [place]" (07/11/2013)  . CORONARY ANGIOPLASTY WITH STENT PLACEMENT  12/2011  . CORONARY ARTERY BYPASS GRAFT  1995   CABG X3  . ESOPHAGOGASTRODUODENOSCOPY N/A 07/12/2013   Procedure: ESOPHAGOGASTRODUODENOSCOPY (EGD);  Surgeon: Inda Castle, MD;  Location: [facility];  Service: Endoscopy;  Laterality: N/A;  . EXCISIONAL HEMORRHOIDECTOMY     "twice cut them out; burnt them out once"  . FUNCTIONAL ENDOSCOPIC SINUS SURGERY  2006  .  HYDROCELE EXCISION  2005  . INGUINAL HERNIA REPAIR Right 2006  . KNEE ARTHROSCOPY Right 1990's  . LEFT HEART CATHETERIZATION WITH CORONARY/GRAFT ANGIOGRAM  01/07/2012   Procedure: LEFT HEART CATHETERIZATION WITH Beatrix Fetters;  Surgeon: Peter M Martinique, MD;  Location: [facility] CATH LAB;  Service: Cardiovascular;;  . PACEMAKER IMPLANT N/A 12/14/2017   Procedure: PACEMAKER IMPLANT;  Surgeon: Constance Haw, MD;  Location: [facility] CV LAB;  Service: Cardiovascular;  Laterality: N/A;  . PERCUTANEOUS CORONARY STENT INTERVENTION (PCI-S) N/A 01/10/2012   Procedure: PERCUTANEOUS CORONARY STENT INTERVENTION (PCI-S);  Surgeon: Peter M Martinique, MD;  [facility] CATH LAB;  Service: Cardiovascular;  Laterality: N/A;  . PROSTATE SURGERY  1998    Family History:  Family History  Problem Relation Age of Onset  . CAD Father 48    Social History:  reports  that he has never smoked. He has never used smokeless tobacco. He reports current alcohol use. He reports that he does not use drugs.  Additional Social History:  Alcohol / Drug Use Pain Medications: See MAR Prescriptions: See MAR Over the Counter: See MAR History of alcohol / drug use?: No history of alcohol / drug abuse Longest period of sobriety (when/how long): NA  CIWA: CIWA-Ar BP: (!) 163/86 Pulse Rate: 84 COWS:    Allergies:  Allergies  Allergen Reactions  . Other Other (See Comments)    Cream cheese-makes very sick    Home Medications: (Not in a hospital admission)   OB/GYN Status:  No LMP for male patient.  General Assessment Data Assessment unable to be completed: Yes Reason for not completing assessment: RN with pt at this time Location of Assessment: [facility] ED TTS Assessment: Out of system Is this a Tele or Face-to-Face Assessment?: Face-to-Face Is this an Initial Assessment or a Re-assessment for this encounter?: Initial Assessment Patient Accompanied by:: N/A Language Other than English: No Living Arrangements: Other (Comment)(home) What gender do you identify as?: Male Marital status: Widowed Booker name: NA Pregnancy Status: No Living Arrangements: Alone Can pt return to current living arrangement?: No Admission Status: Voluntary Is patient capable of signing voluntary admission?: Yes Referral Source: Self/Family/Friend Insurance type: medicare     Crisis Care Plan Living Arrangements: Alone Legal Guardian: Other:(Self) Name of Psychiatrist: none Name of Therapist: none  Education Status Is patient currently in school?: No Is the patient employed, unemployed or receiving disability?: Receiving disability income(retired)  Risk to self with the past 6 months Suicidal Ideation: No Has patient been a risk to self within the past 6 months prior to admission? : No Suicidal Intent: No Has patient had any suicidal intent within the past 6 months  prior to admission? : No Is patient at risk for suicide?: No Suicidal Plan?: No Has patient had any suicidal plan within the past 6 months prior to admission? : No Access to Means: No What has been your use of drugs/alcohol within the last 12 months?: none Previous Attempts/Gestures: No How many times?: 0 Other Self Harm Risks: none Triggers for Past Attempts: None known Intentional Self Injurious Behavior: None Family Suicide History: No Recent stressful life event(s): Recent negative physical changes Persecutory voices/beliefs?: Yes Depression: No Substance abuse history and/or treatment for substance abuse?: No Suicide prevention information given to non-admitted patients: Not applicable  Risk to Others within the past 6 months Homicidal Ideation: No Does patient have any lifetime risk of violence toward others beyond the six months prior to admission? : No Thoughts of Harm to Others: No  Current Homicidal Intent: No Current Homicidal Plan: No Access to Homicidal Means: No Identified Victim: none History of harm to others?: No Assessment of Violence: None Noted Violent Behavior Description: none Does patient have access to weapons?: No Criminal Charges Pending?: No Does patient have a court date: No Is patient on probation?: No  Psychosis Hallucinations: Auditory, Visual Delusions: None noted  Mental Status Report Appearance/Hygiene: In hospital gown, Unremarkable Eye Contact: Good Motor Activity: Unable to assess Speech: Logical/coherent Level of Consciousness: Alert Mood: Pleasant Affect: Appropriate to circumstance Anxiety Level: None Thought Processes: Coherent, Relevant Judgement: Partial Orientation: Person, Place, Time, Situation Obsessive Compulsive Thoughts/Behaviors: None  Cognitive Functioning Concentration: Normal Memory: Recent Intact, Remote Intact Is patient IDD: No Insight: Fair Impulse Control: Fair Appetite: Good Have you had any weight  changes? : No Change Sleep: Decreased Total Hours of Sleep: 2 Vegetative Symptoms: None  ADLScreening [facility] Assessment Services) Patient's cognitive ability adequate to safely complete daily activities?: Yes Patient able to express need for assistance with ADLs?: Yes Independently performs ADLs?: Yes (appropriate for developmental age)  Prior Inpatient Therapy Prior Inpatient Therapy: No  Prior Outpatient Therapy Prior Outpatient Therapy: No Does patient have an ACCT team?: No Does patient have Intensive In-House Services?  : No Does patient have [facility] services? : No Does patient have P4CC services?: No  ADL Screening (condition at time of admission) Patient's cognitive ability adequate to safely complete daily activities?: Yes Patient able to express need for assistance with ADLs?: Yes Independently performs ADLs?: Yes (appropriate for developmental age)       Abuse/Neglect Assessment (Assessment to be complete while patient is alone) Abuse/Neglect Assessment Can Be Completed: Yes Physical Abuse: Denies Verbal Abuse: Denies Sexual Abuse: Denies Exploitation of patient/patient's resources: Denies Self-Neglect: Denies Values / Beliefs Cultural Requests During Hospitalization: None Spiritual Requests During Hospitalization: None Consults Spiritual Care Consult Needed: No Social Work Consult Needed: No Regulatory affairs officer (For Healthcare) Does Patient Have a Medical Advance Directive?: No Would patient like information on creating a medical advance directive?: No - Patient declined          Disposition:  Disposition Initial Assessment Completed for this Encounter: Yes  PA Patriciaann Clan recommends the pt be observed overnight for safety and stabilization.  The pt is to be reassessed by psychiatry in the morning.   On Site Evaluation by:   Reviewed with Physician:    Enzo Montgomery 07/25/2018 10:37 PM

## 2018-07-25 NOTE — ED Notes (Signed)
Pt. ambulated around [facility] with 3 tech assist. Pt. 02 remained at 100% through duration of his walk.

## 2018-07-26 ENCOUNTER — Ambulatory Visit (INDEPENDENT_AMBULATORY_CARE_PROVIDER_SITE_OTHER): Payer: Medicare Other

## 2018-07-26 DIAGNOSIS — I441 Atrioventricular block, second degree: Secondary | ICD-10-CM | POA: Diagnosis not present

## 2018-07-26 LAB — CBG MONITORING, ED: Glucose-Capillary: 101 mg/dL — ABNORMAL HIGH (ref 70–99)

## 2018-07-26 MED ORDER — AEROCHAMBER PLUS FLO-VU LARGE MISC
1.0000 | Freq: Once | Status: AC
  Administered 2018-07-26: 1
  Filled 2018-07-26: qty 1

## 2018-07-26 MED ORDER — GUAIFENESIN 100 MG/5ML PO SOLN
10.0000 mL | Freq: Once | ORAL | Status: DC
  Filled 2018-07-26: qty 10

## 2018-07-26 NOTE — ED Notes (Signed)
Son verbalizes understanding of discharge instructions.

## 2018-07-26 NOTE — ED Notes (Signed)
Pt ambulated back from the restroom, no distress noted. Some increased dyspnea when walking. Pt had large bowel movement, clean brief provided, pt sitting on side of his bed eating breakfast at this time.

## 2018-07-26 NOTE — ED Notes (Signed)
Meal tray delivered to pt

## 2018-07-26 NOTE — ED Notes (Signed)
Robitussin arrived from pharmacy.  Patient sleeping soundly.  To hold cough syrup until patient wakes up coughing again.

## 2018-07-26 NOTE — ED Notes (Signed)
Lunch delivered to pt.

## 2018-07-26 NOTE — ED Notes (Signed)
Assisted to use urinal.  States I'm awful winded lately.  Breathing treatment started per order.  Bed alarm activated for patient safety.

## 2018-07-26 NOTE — ED Notes (Signed)
Pt discharged with son.

## 2018-07-26 NOTE — Discharge Instructions (Signed)
Please follow-up as an outpatient with your primary care provider and social services.  As noted in your attached CT scan you have further growth of the mass noted.  Please follow-up with the ENT specialist for further evaluation and management of this

## 2018-07-26 NOTE — ED Notes (Signed)
Pt denies SI, denies HI, denies hallucinations. Pt on 3 l . States that he doesn't know how his breathing feels and won't until he gets up and walks around. Breakfast still at bedside.

## 2018-07-26 NOTE — ED Notes (Signed)
Son at bedside. Pts belongings returned. Pt getting dressed with help of son.

## 2018-07-26 NOTE — ED Notes (Signed)
Family left for the night.  Left instructions to call them for any change or updates.  Jerilynn Birkenhead [phone] daughter, Joanne, Br[place] [phone] son.

## 2018-07-26 NOTE — Progress Notes (Signed)
Disposition CSW asked to follow up with pt's son, Cledis, Sohn. to ask about his typical mental status and to recommend that pt have some supportive care in his home, as he apparently lives alone.  Son verified that pt does live alone but [place] has arranged for patient to have a [employer]  aide coming in 2-3 times weekly.  Son reports that the family is getting assistance from the [place] with an ALF placement for patient at [facility] in [place].  However, [facility] caseworker resigned at the end of October 2019 and they have made no progress in that direction since November 2019.  Son encouraged to follow-up with the [place] regarding getting a new case Freight forwarder.    CSW contacted [facility] and found out that patient is likely part of their Integrated Case Management Services.  From their web site, these services provide:  Referring and/or coordinating Medicaid covered diagnostic services for individuals that have functional impairments or behavioral health disabilities, that require specialized health care procedures covered by Medicaid Performing functional assessments of strength and need assessments and providing follow-up contact to ensure that an individual has received the prescribed medical/behavioral health services Referrals or evaluations to Medicaid covered services to assist with case coordination for individuals with specialized medical or behavioral health needs Monitoring and evaluating the Medicaid covered medical components of the individual's service plan and ensuring that service plan objectives are achieved and appropriate Participating in treatment plan meetings to coordinate and monitor the medical portion of a client's service plan with other staff & gathering information for facilitating prior authorizations  In-home training with a parent or family that improves the coordination/delivery of medical/mental health services to the client  Patient is  otherwise psych cleared,  Romie Minus T. Judi Cong, MSW, [place] Disposition Clinical Social Work [phone] (cell) [phone] (office)

## 2018-07-26 NOTE — ED Notes (Signed)
TTS in progress at bedside.

## 2018-07-26 NOTE — Consult Note (Addendum)
Telepsych Consultation   Reason for Consult:  Hallucinations Referring Physician:  EPD Location of Patient:  C04 Location of Provider: [facility]  Patient Identification: [name] MRN:  [id number] Principal Diagnosis: <principal problem not specified> Diagnosis:  Active Problems:   * No active hospital problems. *   Total Time spent with patient: 15 minutes  Subjective:   [name] is a 85 y.o. male patient admitted with hallucinations.  Patient was evaluated via tele-assessment.  Appears to have a difficult time time hearing questions. (Nurse L. Bishop provided translation)  [name] is awake alert and oriented to self and place only.   Chart reviewed no reported symptoms  charted by staff. unknown past medical history.  Patient appears to be pleasant, calm and cooperative.  Continues denies suicidal or homicidal ideations with auditory or visual hallucinations.  NP and SW  contacted patient's son [name])  regarding collateral information.  Who denies patient has a family/personal history of mental illness.  Denies previous suicidal ideations plan or intent.  Reports cognitive and hallucinations are new, however states he is unsure when symptoms first started. Son reported that  patient resides by hisself and family has been attempting to get additional support through the [place].    Son reported patient called to be admitted due to "cold related symptoms". Stated that patient is followed by the [facility] and is hopeful to find  living placement.  Reports patient already has port in place social worker visiting/monitoring 3 times a week. [facility].  CSW to provide additional resources support encouragement, reassurance was provided.  HPI: per assessment not by tts counselor:[name] is an 85 y.o. male.  The pt stated he came in due to having falls.  According to the pt's daughter, the pt has been having hallucinations. On 07/20/2018 pt called the police  and stated there were people in his home.  The police didn't find anything in the home.  The pt stated there are people coming in his house and taking things.  He told his daughter not to go to the house alone because of the people at the house. The daughter also stated there were not other people in the home.  While at the hospital, the pt stated he was seeing turkeys in the hospital room.  The pt's daughter stated she was concerned the hallucinations were due to a lack of oxygen. The pt uses oxygen.  The pt denies SI and HI.  He denies any previous mental health treatment.  The pt lives alone.  He denies self harm and legal issues.  The pt stated he has been sleeping about 2 hours a night for the past few nights.  The pt stated he eats well.  He denies symptoms of depression.   Past Psychiatric History: was denied by patient son B. Wallcewho provided additional collateral information  Risk to Self: Suicidal Ideation: No Suicidal Intent: No Is patient at risk for suicide?: No Suicidal Plan?: No Access to Means: No What has been your use of drugs/alcohol within the last 12 months?: none How many times?: 0 Other Self Harm Risks: none Triggers for Past Attempts: None known Intentional Self Injurious Behavior: None Risk to Others: Homicidal Ideation: No Thoughts of Harm to Others: No Current Homicidal Intent: No Current Homicidal Plan: No Access to Homicidal Means: No Identified Victim: none History of harm to others?: No Assessment of Violence: None Noted Violent Behavior Description: none Does patient have access to weapons?: No Criminal  Charges Pending?: No Does patient have a court date: No Prior Inpatient Therapy: Prior Inpatient Therapy: No Prior Outpatient Therapy: Prior Outpatient Therapy: No Does patient have an ACCT team?: No Does patient have Intensive In-House Services?  : No Does patient have [facility] services? : No Does patient have P4CC services?: No  Past Medical  History:  Past Medical History:  Diagnosis Date  . Acute blood loss anemia    a. 06/2013 due to GIB.  Marland Kitchen Arthritis    "knees; hands" (07/11/2013)  . Asthma   . CAD (coronary artery disease)    a. s/p CABG x 3 in 1995; b.  NSTEMI 12/2011 -> Cath 6/21: LM 20-30, LAD 90, RI 95, LCX 59m, RCA 90/181m (treated w/ 2.0x16 Promus DES), VG->RCA ok, VG->RI 70-65m (treated w/ 4.0x28 Promus), LIMA->LAD ok, EF 55-60%.  . Chronic diastolic CHF (congestive heart failure) ([place])    a. Echo 10/2011: EF 55-60%;  b. elevated EDP req diuresis.  . Chronic respiratory failure ([place]) 07/11/2013   a. 3L home O2 24/7.  Marland Kitchen CKD (chronic kidney disease)    a. ?Based on Cr 2013 - 1.3-1.7.  . COPD (chronic obstructive pulmonary disease) ([place])    a. on home O2  . GERD (gastroesophageal reflux disease)   . GI bleed    a. 06/2013 adm - prepyloric ulcer, path neg for malignancy. A/w ABL anemia.  Marland Kitchen HLD (hyperlipidemia)   . HOH (hard of hearing)    left ear  . HTN (hypertension)   . Hypothyroidism   . Morbid obesity ([place])   . Orthopnea    a. Has been sleeping in a recliner x 30 yrs.  . Sleep apnea   . Syncope     Past Surgical History:  Procedure Laterality Date  . [place]   "when I was in the [place]" (07/11/2013)  . CORONARY ANGIOPLASTY WITH STENT PLACEMENT  12/2011  . CORONARY ARTERY BYPASS GRAFT  1995   CABG X3  . ESOPHAGOGASTRODUODENOSCOPY N/A 07/12/2013   Procedure: ESOPHAGOGASTRODUODENOSCOPY (EGD);  Surgeon: Inda Castle, MD;  Location: [facility];  Service: Endoscopy;  Laterality: N/A;  . EXCISIONAL HEMORRHOIDECTOMY     "twice cut them out; burnt them out once"  . FUNCTIONAL ENDOSCOPIC SINUS SURGERY  2006  . HYDROCELE EXCISION  2005  . INGUINAL HERNIA REPAIR Right 2006  . KNEE ARTHROSCOPY Right 1990's  . LEFT HEART CATHETERIZATION WITH CORONARY/GRAFT ANGIOGRAM  01/07/2012   Procedure: LEFT HEART CATHETERIZATION WITH [name];  Surgeon: Peter M Martinique, MD;  Location: [facility] CATH  LAB;  Service: Cardiovascular;;  . PACEMAKER IMPLANT N/A 12/14/2017   Procedure: PACEMAKER IMPLANT;  Surgeon: Constance Haw, MD;  Location: [facility] CV LAB;  Service: Cardiovascular;  Laterality: N/A;  . PERCUTANEOUS CORONARY STENT INTERVENTION (PCI-S) N/A 01/10/2012   Procedure: PERCUTANEOUS CORONARY STENT INTERVENTION (PCI-S);  Surgeon: Peter M Martinique, MD;  Location: [facility] CATH LAB;  Service: Cardiovascular;  Laterality: N/A;  . PROSTATE SURGERY  1998   Family History:  Family History  Problem Relation Age of Onset  . CAD Father 30   Family Psychiatric  History:  Social History:  Social History   Substance and Sexual Activity  Alcohol Use Yes   Comment: prior alcohol, no sig use now     Social History   Substance and Sexual Activity  Drug Use No    Social History   Socioeconomic History  . Marital status: Widowed    Spouse name: Not on file  .  Number of children: 3  . Years of education: Not on file  . Highest education level: Not on file  Occupational History  . Occupation: Retired    Comment: Patent examiner  Social Needs  . Financial resource strain: Not on file  . Food insecurity:    Worry: Not on file    Inability: Not on file  . Transportation needs:    Medical: Not on file    Non-medical: Not on file  Tobacco Use  . Smoking status: Never Smoker  . Smokeless tobacco: Never Used  Substance and Sexual Activity  . Alcohol use: Yes    Comment: prior alcohol, no sig use now  . Drug use: No  . Sexual activity: Never  Lifestyle  . Physical activity:    Days per week: Not on file    Minutes per session: Not on file  . Stress: Not on file  Relationships  . Social connections:    Talks on phone: Not on file    Gets together: Not on file    Attends religious service: Not on file    Active member of club or organization: Not on file    Attends meetings of clubs or organizations: Not on file    Relationship status: Not on file  Other  Topics Concern  . Not on file  Social History Narrative   Widower.  Lives with son.     Additional Social History:    Allergies:   Allergies  Allergen Reactions  . Other Other (See Comments)    Cream cheese-makes very sick    Labs:  Results for orders placed or performed during the hospital encounter of 07/25/18 (from the past 48 hour(s))  Basic metabolic panel     Status: Abnormal   Collection Time: 07/25/18  4:00 PM  Result Value Ref Range   Sodium 138 135 - 145 mmol/L   Potassium 4.0 3.5 - 5.1 mmol/L   Chloride 95 (L) 98 - 111 mmol/L   CO2 34 (H) 22 - 32 mmol/L   Glucose, Bld 166 (H) 70 - 99 mg/dL   BUN 19 8 - 23 mg/dL   Creatinine, Ser 1.52 (H) 0.61 - 1.24 mg/dL   Calcium 8.7 (L) 8.9 - 10.3 mg/dL   GFR calc non Af Amer 41 (L) >60 mL/min   GFR calc Af Amer 48 (L) >60 mL/min   Anion gap 9 5 - 15    Comment: Performed at [facility], [address], [place], [place] [address]  CBC     Status: Abnormal   Collection Time: 07/25/18  4:00 PM  Result Value Ref Range   WBC 9.0 4.0 - 10.5 K/uL   RBC 4.79 4.22 - 5.81 MIL/uL   Hemoglobin 13.6 13.0 - 17.0 g/dL   HCT 45.7 39.0 - 52.0 %   MCV 95.4 80.0 - 100.0 fL   MCH 28.4 26.0 - 34.0 pg   MCHC 29.8 (L) 30.0 - 36.0 g/dL   RDW 14.5 11.5 - 15.5 %   Platelets 199 150 - 400 K/uL   nRBC 0.0 0.0 - 0.2 %    Comment: Performed at [facility], [place] [address] [place], [place] [address]  Hepatic function panel     Status: Abnormal   Collection Time: 07/25/18  4:00 PM  Result Value Ref Range   Total Protein 7.1 6.5 - 8.1 g/dL   Albumin 3.1 (L) 3.5 - 5.0 g/dL   AST 25 15 - 41  U/L   ALT 24 0 - 44 U/L   Alkaline Phosphatase 69 38 - 126 U/L   Total Bilirubin 0.6 0.3 - 1.2 mg/dL   Bilirubin, Direct 0.1 0.0 - 0.2 mg/dL   Indirect Bilirubin 0.5 0.3 - 0.9 mg/dL    Comment: Performed at [facility] [address], [place], [place] [address]  CBG monitoring, ED     Status: Abnormal   Collection Time: 07/25/18  4:57  PM  Result Value Ref Range   Glucose-Capillary 137 (H) 70 - 99 mg/dL   Comment 1 Notify RN    Comment 2 Document in Chart   Ammonia     Status: None   Collection Time: 07/25/18  5:00 PM  Result Value Ref Range   Ammonia 34 9 - 35 umol/L    Comment: Performed at [facility], [place] [address] [place], [place] [address]  I-Stat arterial blood gas, ED     Status: Abnormal   Collection Time: 07/25/18  5:02 PM  Result Value Ref Range   pH, Arterial 7.330 (L) 7.350 - 7.450   pCO2 arterial 72.0 (HH) 32.0 - 48.0 mmHg   pO2, Arterial 79.0 (L) 83.0 - 108.0 mmHg   Bicarbonate 37.9 (H) 20.0 - 28.0 mmol/L   TCO2 40 (H) 22 - 32 mmol/L   O2 Saturation 94.0 %   Acid-Base Excess 9.0 (H) 0.0 - 2.0 mmol/L   Patient temperature HIDE    Sample type ARTERIAL    Comment NOTIFIED PHYSICIAN   Urinalysis, Routine w reflex microscopic     Status: None   Collection Time: 07/25/18  5:45 PM  Result Value Ref Range   Color, Urine YELLOW YELLOW   APPearance CLEAR CLEAR   Specific Gravity, Urine 1.010 1.005 - 1.030   pH 7.0 5.0 - 8.0   Glucose, UA NEGATIVE NEGATIVE mg/dL   Hgb urine dipstick NEGATIVE NEGATIVE   Bilirubin Urine NEGATIVE NEGATIVE   Ketones, ur NEGATIVE NEGATIVE mg/dL   Protein, ur NEGATIVE NEGATIVE mg/dL   Nitrite NEGATIVE NEGATIVE   Leukocytes, UA NEGATIVE NEGATIVE    Comment: Microscopic not done on urines with negative protein, blood, leukocytes, nitrite, or glucose < 500 mg/dL. Performed at [facility], [place] [address] [place], [place] [address]   POC CBG, ED     Status: Abnormal   Collection Time: 07/25/18 10:35 PM  Result Value Ref Range   Glucose-Capillary 129 (H) 70 - 99 mg/dL  POC CBG, ED     Status: Abnormal   Collection Time: 07/26/18  6:38 AM  Result Value Ref Range   Glucose-Capillary 101 (H) 70 - 99 mg/dL    Medications:  Current Facility-Administered Medications  Medication Dose Route Frequency Provider Last Rate Last Dose  . acetaminophen (TYLENOL)  tablet 650 mg  650 mg Oral Q4H PRN Isla Pence, MD      . albuterol (PROVENTIL HFA;VENTOLIN HFA) 108 (90 Base) MCG/ACT inhaler 2 puff  2 puff Inhalation Q6H PRN Isla Pence, MD      . albuterol (PROVENTIL) (2.5 MG/3ML) 0.083% nebulizer solution 2.5 mg  2.5 mg Nebulization Q6H PRN Isla Pence, MD   2.5 mg at 07/26/18 0525  . alum & mag hydroxide-simeth (MAALOX/MYLANTA) 200-200-20 MG/5ML suspension 30 mL  30 mL Oral Q6H PRN Isla Pence, MD      . aspirin EC tablet 81 mg  81 mg Oral Daily Isla Pence, MD   81 mg at 07/26/18 1035  . atorvastatin (LIPITOR) tablet  80 mg  80 mg Oral q1800 Isla Pence, MD      . clopidogrel (PLAVIX) tablet 75 mg  75 mg Oral Q breakfast Isla Pence, MD   75 mg at 07/26/18 1035  . fluticasone (FLONASE) 50 MCG/ACT nasal spray 2 spray  2 spray Each Nare Daily Isla Pence, MD   2 spray at 07/26/18 1036  . furosemide (LASIX) tablet 60 mg  60 mg Oral BID Isla Pence, MD   60 mg at 07/26/18 1035  . glimepiride (AMARYL) tablet 2 mg  2 mg Oral Q breakfast Isla Pence, MD   2 mg at 07/26/18 1042  . guaiFENesin (ROBITUSSIN) 100 MG/5ML solution 200 mg  10 mL Oral Once Ward, Kristen N, DO      . isosorbide mononitrate (IMDUR) 24 hr tablet 30 mg  30 mg Oral Daily Isla Pence, MD   30 mg at 07/26/18 1035  . mometasone-formoterol (DULERA) 200-5 MCG/ACT inhaler 2 puff  2 puff Inhalation BID Isla Pence, MD   2 puff at 07/26/18 1036  . montelukast (SINGULAIR) tablet 10 mg  10 mg Oral QHS Isla Pence, MD   10 mg at 07/25/18 2211  . multivitamin with minerals tablet 1 tablet  1 tablet Oral Daily Isla Pence, MD   1 tablet at 07/26/18 1035  . ondansetron (ZOFRAN) tablet 4 mg  4 mg Oral Q8H PRN Isla Pence, MD      . pantoprazole (PROTONIX) EC tablet 40 mg  40 mg Oral Daily Isla Pence, MD   40 mg at 07/26/18 1036  . potassium chloride SA (K-DUR,KLOR-CON) CR tablet 20 mEq  20 mEq Oral BID Isla Pence, MD   20 mEq at 07/26/18 1035    Current Outpatient Medications  Medication Sig Dispense Refill  . albuterol (PROVENTIL HFA;VENTOLIN HFA) 108 (90 BASE) MCG/ACT inhaler Inhale 2 puffs into the lungs every 6 (six) hours as needed for wheezing or shortness of breath.    Marland Kitchen albuterol (PROVENTIL) (2.5 MG/3ML) 0.083% nebulizer solution Take 3 mLs (2.5 mg total) by nebulization every 6 (six) hours as needed for wheezing. 75 mL 12  . ARTIFICIAL TEAR OP Place 2 drops into both eyes daily as needed. For dry eyes    . aspirin 81 MG tablet Take 1 tablet (81 mg total) by mouth daily.    Marland Kitchen atorvastatin (LIPITOR) 80 MG tablet Take 1 tablet (80 mg total) by mouth daily at 6 PM. Please make overdue appt with Dr. Acie Fredrickson before anymore refills. 2nd attempt 15 tablet 0  . budesonide-formoterol (SYMBICORT) 160-4.5 MCG/ACT inhaler Inhale 2 puffs into the lungs 2 (two) times daily.    . clopidogrel (PLAVIX) 75 MG tablet Take 1 tablet (75 mg total) by mouth daily with breakfast. Please make overdue appt with Dr. Acie Fredrickson before anymore refills. 3rd attempt 15 tablet 0  . fluticasone (FLONASE) 50 MCG/ACT nasal spray Place 2 sprays into both nostrils daily. 16 g 0  . furosemide (LASIX) 40 MG tablet Take 1.5 tablets (60 mg total) by mouth 2 (two) times daily. 90 tablet 0  . glimepiride (AMARYL) 2 MG tablet Take 2 mg by mouth daily with breakfast.    . isosorbide mononitrate (IMDUR) 30 MG 24 hr tablet Take 1 tablet (30 mg total) by mouth daily. Please make overdue appt with Dr. Acie Fredrickson before anymore refills. 3rd and Final attempt 15 tablet 0  . montelukast (SINGULAIR) 10 MG tablet Take 10 mg by mouth at bedtime.    . Multiple Vitamins-Minerals (MULTIVITAMIN & MINERAL  PO) Take 1 tablet by mouth daily.      . nitroGLYCERIN (NITROSTAT) 0.4 MG SL tablet Place 1 tablet (0.4 mg total) under the tongue every 5 (five) minutes as needed for chest pain. 25 tablet 0  . pantoprazole (PROTONIX) 40 MG tablet Take 1 tablet (40 mg total) by mouth daily. Patient overdue for  an appointment. Needs to call and schedule for further refills 2nd attempt 15 tablet 0  . potassium chloride SA (K-DUR,KLOR-CON) 20 MEQ tablet Take 1 tablet (20 mEq total) by mouth 2 (two) times daily. Please make overdue appt with Dr.Nahser. 3rd and Final attempt 30 tablet 0  . tiotropium (SPIRIVA) 18 MCG inhalation capsule Place 1 capsule (18 mcg total) into inhaler and inhale daily. (Patient not taking: Reported on 07/25/2018) 30 capsule 12    Musculoskeletal: Strength & Muscle Tone: u/a Gait & Station: uta Patient leans: N/A  Psychiatric Specialty Exam: Physical Exam  Constitutional: He appears well-developed.  Neurological: He is alert.  Psychiatric: He has a normal mood and affect. His behavior is normal.    Review of Systems  Psychiatric/Behavioral: Negative for hallucinations and suicidal ideas. The patient is not nervous/anxious.   All other systems reviewed and are negative.   Blood pressure 135/84, pulse 90, temperature 98.1 F (36.7 C), temperature source Oral, resp. rate 18, SpO2 100 %.There is no height or weight on file to calculate BMI.  General Appearance: Guarded  Eye Contact:  Fair  Speech:  Clear and Coherent  Volume:  Increased  Mood:  NA  Affect:  Congruent  Thought Process:  Coherent  Orientation:  Full (Time, Place, and Person)  Thought Content:  NA  Suicidal Thoughts:  No  Homicidal Thoughts:  No  Memory:  Immediate;   Poor  Judgement:  Fair  Insight:  Fair  Psychomotor Activity:  N/A  Concentration:  Concentration: Fair  Recall:  Poor  Fund of Knowledge:  Poor  Language:  Fair  Akathisia:  NA  Handed:  Right  AIMS (if indicated):     Assets:  Communication Skills Desire for Improvement Resilience Social Support  ADL's:  Intact  Cognition:  WNL  Sleep:        Treatment Plan Summary: Daily contact with patient to assess and evaluate symptoms and progress in treatment and Medication management  NP attempted to contact [facility]. [facility] regarding   of discharged disposition    Disposition: No evidence of imminent risk to self or others at present.   Patient does not meet criteria for psychiatric inpatient admission. Supportive therapy provided about ongoing stressors. Refer to IOP. Discussed crisis plan, support from social network, calling 911, coming to the Emergency Department, and calling Suicide Hotline. - [facility] provide additional assistance with assisted living as requested by patient's son Cris Talavera   This service was provided via telemedicine using a 2-way, interactive audio and Radiographer, therapeutic.  Names of all persons participating in this telemedicine service and their role in this encounter. Name: Galvin Aversa  Role:patient   Name: T.lewis Role: NP  Name: Ardelle Park Role: nurse       Derrill Center, NP 07/26/2018 10:48 AM

## 2018-07-26 NOTE — ED Notes (Signed)
This RN called the pts son, he does not get off of work until 3:30 pm. He will come and get the pt at that time.

## 2018-07-26 NOTE — ED Provider Notes (Signed)
85 year old male here with hallucinations.  Please see previous providers note for full H&P.  Patient was medically cleared, evaluated by behavioral health.  They feel patient is stable for outpatient management.  Case management notes that they he is involved with the rocking him [place] DSS integrated case management services.  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  Patient cannot provide a very clear history at bedside, he does note some difficulty with the sinus and has been followed before.  I will encourage patient follow-up as an outpatient with ENT for further evaluation and management.  Nursing staff contacting family for patient disposition.  Vitals:   07/26/18 0600 07/26/18 1213  BP: 135/84 132/69  Pulse: 90 94  Resp: 18 18  Temp: 98.1 F (36.7 C) 97.8 F (36.6 C)  SpO2: 100% 96%     Okey Regal, PA-C 07/26/18 1339    Maudie Flakes, MD 07/26/18 1407

## 2018-07-26 NOTE — ED Notes (Signed)
Pt ambulated to restroom. 

## 2018-07-26 NOTE — ED Notes (Signed)
pts mother, Webb Silversmith, called for an update, says he has her number

## 2018-07-27 LAB — CUP PACEART REMOTE DEVICE CHECK
Battery Remaining Longevity: 126 mo
Battery Voltage: 3.04 V
Brady Statistic AP VP Percent: 1.22 %
Brady Statistic AP VS Percent: 0.39 %
Brady Statistic AS VP Percent: 92.9 %
Brady Statistic AS VS Percent: 5.49 %
Brady Statistic RA Percent Paced: 1.52 %
Brady Statistic RV Percent Paced: 94.12 %
Date Time Interrogation Session: 20200108221540
Implantable Lead Implant Date: 20190529
Implantable Lead Implant Date: 20190529
Implantable Lead Location: 753859
Implantable Lead Location: 753860
Implantable Lead Model: 5076
Implantable Pulse Generator Implant Date: 20190529
Lead Channel Impedance Value: 266 Ohm
Lead Channel Impedance Value: 342 Ohm
Lead Channel Impedance Value: 437 Ohm
Lead Channel Impedance Value: 494 Ohm
Lead Channel Pacing Threshold Amplitude: 0.5 V
Lead Channel Pacing Threshold Amplitude: 0.875 V
Lead Channel Pacing Threshold Pulse Width: 0.4 ms
Lead Channel Pacing Threshold Pulse Width: 0.4 ms
Lead Channel Sensing Intrinsic Amplitude: 1.5 mV
Lead Channel Sensing Intrinsic Amplitude: 1.5 mV
Lead Channel Sensing Intrinsic Amplitude: 18.375 mV
Lead Channel Setting Pacing Amplitude: 2 V
Lead Channel Setting Pacing Pulse Width: 0.4 ms
Lead Channel Setting Sensing Sensitivity: 2 mV
MDC IDC MSMT LEADCHNL RV SENSING INTR AMPL: 18.375 mV
MDC IDC SET LEADCHNL RV PACING AMPLITUDE: 2.5 V

## 2018-07-27 NOTE — Progress Notes (Signed)
Remote pacemaker transmission.   

## 2018-08-23 ENCOUNTER — Encounter (HOSPITAL_COMMUNITY): Payer: Self-pay | Admitting: Emergency Medicine

## 2018-08-23 ENCOUNTER — Other Ambulatory Visit: Payer: Self-pay

## 2018-08-23 ENCOUNTER — Emergency Department (HOSPITAL_COMMUNITY)
Admission: EM | Admit: 2018-08-23 | Discharge: 2018-08-24 | Disposition: A | Discharge status: Home / Self Care | Payer: Medicare Other | Attending: Emergency Medicine | Admitting: Emergency Medicine

## 2018-08-23 DIAGNOSIS — I130 Hypertensive heart and chronic kidney disease with heart failure and stage 1 through stage 4 chronic kidney disease, or unspecified chronic kidney disease: Secondary | ICD-10-CM | POA: Insufficient documentation

## 2018-08-23 DIAGNOSIS — S51012A Laceration without foreign body of left elbow, initial encounter: Secondary | ICD-10-CM | POA: Diagnosis not present

## 2018-08-23 DIAGNOSIS — R55 Syncope and collapse: Secondary | ICD-10-CM | POA: Insufficient documentation

## 2018-08-23 DIAGNOSIS — E1122 Type 2 diabetes mellitus with diabetic chronic kidney disease: Secondary | ICD-10-CM | POA: Diagnosis not present

## 2018-08-23 DIAGNOSIS — Z7984 Long term (current) use of oral hypoglycemic drugs: Secondary | ICD-10-CM | POA: Diagnosis not present

## 2018-08-23 DIAGNOSIS — Z7982 Long term (current) use of aspirin: Secondary | ICD-10-CM | POA: Diagnosis not present

## 2018-08-23 DIAGNOSIS — Y9389 Activity, other specified: Secondary | ICD-10-CM | POA: Diagnosis not present

## 2018-08-23 DIAGNOSIS — N183 Chronic kidney disease, stage 3 (moderate): Secondary | ICD-10-CM | POA: Insufficient documentation

## 2018-08-23 DIAGNOSIS — J449 Chronic obstructive pulmonary disease, unspecified: Secondary | ICD-10-CM | POA: Insufficient documentation

## 2018-08-23 DIAGNOSIS — I5032 Chronic diastolic (congestive) heart failure: Secondary | ICD-10-CM | POA: Insufficient documentation

## 2018-08-23 DIAGNOSIS — Z7901 Long term (current) use of anticoagulants: Secondary | ICD-10-CM | POA: Insufficient documentation

## 2018-08-23 DIAGNOSIS — Y999 Unspecified external cause status: Secondary | ICD-10-CM | POA: Diagnosis not present

## 2018-08-23 DIAGNOSIS — W1839XA Other fall on same level, initial encounter: Secondary | ICD-10-CM | POA: Diagnosis not present

## 2018-08-23 DIAGNOSIS — Y929 Unspecified place or not applicable: Secondary | ICD-10-CM | POA: Insufficient documentation

## 2018-08-23 DIAGNOSIS — Z950 Presence of cardiac pacemaker: Secondary | ICD-10-CM | POA: Insufficient documentation

## 2018-08-23 MED ORDER — SODIUM CHLORIDE 0.9% FLUSH
3.0000 mL | Freq: Once | INTRAVENOUS | Status: AC
  Administered 2018-08-24: 3 mL via INTRAVENOUS

## 2018-08-23 NOTE — ED Triage Notes (Signed)
Pt brought in by [facility] for c/o fall; pt states he passed out x 2 times tonight for approximately 30 seconds; pt has 2 skin tears to left arm; cbg 122 with ems

## 2018-08-24 LAB — CBG MONITORING, ED: GLUCOSE-CAPILLARY: 93 mg/dL (ref 70–99)

## 2018-08-24 LAB — CBC
HCT: 46.7 % (ref 39.0–52.0)
Hemoglobin: 14.3 g/dL (ref 13.0–17.0)
MCH: 28.6 pg (ref 26.0–34.0)
MCHC: 30.6 g/dL (ref 30.0–36.0)
MCV: 93.4 fL (ref 80.0–100.0)
Platelets: 146 10*3/uL — ABNORMAL LOW (ref 150–400)
RBC: 5 MIL/uL (ref 4.22–5.81)
RDW: 14.6 % (ref 11.5–15.5)
WBC: 7.5 10*3/uL (ref 4.0–10.5)
nRBC: 0 % (ref 0.0–0.2)

## 2018-08-24 LAB — BASIC METABOLIC PANEL
Anion gap: 7 (ref 5–15)
BUN: 22 mg/dL (ref 8–23)
CO2: 31 mmol/L (ref 22–32)
Calcium: 9 mg/dL (ref 8.9–10.3)
Chloride: 100 mmol/L (ref 98–111)
Creatinine, Ser: 1.51 mg/dL — ABNORMAL HIGH (ref 0.61–1.24)
GFR calc Af Amer: 48 mL/min — ABNORMAL LOW (ref >60)
GFR, EST NON AFRICAN AMERICAN: 42 mL/min — AB (ref >60)
GLUCOSE: 107 mg/dL — AB (ref 70–99)
Potassium: 4 mmol/L (ref 3.5–5.1)
Sodium: 138 mmol/L (ref 135–145)

## 2018-08-24 MED ORDER — SODIUM CHLORIDE 0.9 % IV BOLUS
500.0000 mL | Freq: Once | INTRAVENOUS | Status: AC
  Administered 2018-08-24: 500 mL via INTRAVENOUS

## 2018-08-24 MED ORDER — BACITRACIN ZINC 500 UNIT/GM EX OINT
TOPICAL_OINTMENT | CUTANEOUS | Status: AC
  Administered 2018-08-24: 2
  Filled 2018-08-24: qty 1.8

## 2018-08-24 NOTE — ED Notes (Signed)
Patient given oral fluids and rounding done. Patient comfortable at this time.

## 2018-08-24 NOTE — ED Notes (Signed)
Patient sent home with some dressing supplies. Patient's daughter picked patient up for transport home.

## 2018-08-24 NOTE — Discharge Instructions (Addendum)
Local wound care with bacitracin and dressing changes twice daily.  Continue medications as previously prescribed.  Follow-up with your primary doctor in the next week, and return to the ER if symptoms worsen or change.

## 2018-08-24 NOTE — ED Notes (Signed)
Patient waiting on a ride home. Unable to get in touch with family at this time. Patient on 2 liters of oxygen. Patient still on wall unit at this time.

## 2018-08-24 NOTE — ED Notes (Signed)
Wound care complete. Patient's left forearm cleaned and bacitracin applied. Non-adherent dressing with cling applied area wrapped and dressed. Patient educated on wound care.

## 2018-08-24 NOTE — ED Notes (Signed)
Orthostatic vitals completed

## 2018-08-24 NOTE — ED Provider Notes (Signed)
[facility] EMERGENCY DEPARTMENT Provider Note   CSN: [id number] Arrival date & time: 08/23/18  2345     History   Chief Complaint Chief Complaint  Patient presents with  . Loss of Consciousness    HPI [name] is a 85 y.o. male.  Patient is an 85 year old male with extensive past medical history including coronary artery disease, CHF, chronic renal insufficiency, hypertension.  He presents today for evaluation of syncope.  He states he woke up in the night to go to the bathroom.  He began to feel lightheaded, then states "everything went black".  He fell to the floor causing a skin tear to his left elbow.  He reports being unconscious for several seconds, then waking up.  He denies any palpitations, chest pain, difficulty breathing.  He denies any headache.  He reports a history of having similar episodes "for the past 30 years".  He states that he was told that his blood pressure drops and this is the likely cause.  He denies any recent illness.  He denies any fevers or chills.  The history is provided by the patient.  Loss of Consciousness  Episode history:  Single Most recent episode:  Today Progression:  Resolved Chronicity:  Recurrent Context: normal activity and standing up   Witnessed: no   Relieved by:  Nothing Worsened by:  Nothing Ineffective treatments:  None tried   Past Medical History:  Diagnosis Date  . Acute blood loss anemia    a. 06/2013 due to GIB.  Marland Kitchen Arthritis    "knees; hands" (07/11/2013)  . Asthma   . CAD (coronary artery disease)    a. s/p CABG x 3 in 1995; b.  NSTEMI 12/2011 -> Cath 6/21: LM 20-30, LAD 90, RI 95, LCX 29m, RCA 90/159m (treated w/ 2.0x16 Promus DES), VG->RCA ok, VG->RI 70-79m (treated w/ 4.0x28 Promus), LIMA->LAD ok, EF 55-60%.  . Chronic diastolic CHF (congestive heart failure) ([place])    a. Echo 10/2011: EF 55-60%;  b. elevated EDP req diuresis.  . Chronic respiratory failure ([place]) 07/11/2013   a. 3L home O2 24/7.  Marland Kitchen CKD  (chronic kidney disease)    a. ?Based on Cr 2013 - 1.3-1.7.  . COPD (chronic obstructive pulmonary disease) ([place])    a. on home O2  . GERD (gastroesophageal reflux disease)   . GI bleed    a. 06/2013 adm - prepyloric ulcer, path neg for malignancy. A/w ABL anemia.  Marland Kitchen HLD (hyperlipidemia)   . HOH (hard of hearing)    left ear  . HTN (hypertension)   . Hypothyroidism   . Morbid obesity ([place])   . Orthopnea    a. Has been sleeping in a recliner x 30 yrs.  . Sleep apnea   . Syncope     Patient Active Problem List   Diagnosis Date Noted  . Second degree AV block   . 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 GERD (gastroesophageal reflux disease) 12/12/2017  . Fall 12/12/2017  . COPD with acute exacerbation ([place]) 09/12/2017  . Chronic combined systolic and diastolic CHF (congestive heart failure) ([place]) 01/29/2016  . History of recent fall 10/17/2015  . Risk for falls 10/17/2015  . Recurrent falls 10/17/2015  . Chest pain at rest   . DNR (do not resuscitate) 10/23/2014  . Thrush, oral 10/10/2014  . Sinusitis, chronic 10/07/2014  . Bronchitis, acute 10/06/2014  . Acute kidney injury ([place])   . Atrial fibrillation ([place])   . Acute on chronic diastolic heart  failure ([place]) 10/04/2014  . Chest pain 10/04/2014  . New onset atrial fibrillation ([place]) 10/04/2014  . COPD exacerbation ([place]) 10/04/2014  . COLD (chronic obstructive lung disease) ([place])   . 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 GI bleed   . Acute on chronic diastolic CHF (congestive heart failure) ([place]) 08/07/2013  . COPD (chronic obstructive pulmonary disease) ([place]) 08/07/2013  . CKD (chronic kidney disease), stage III ([place]) 08/07/2013  . Acute blood loss anemia   . Elevated serum creatinine 07/11/2013  . Chronic respiratory failure ([place]) 07/11/2013  . Chronic diastolic CHF (congestive heart failure) ([place])   . CAD (coronary artery disease)   . HTN (hypertension)   . HLD (hyperlipidemia)   . History of acute myocardial  infarction 01/06/2012  . Type II diabetes mellitus with renal manifestations ([place]) 01/06/2012  . Obstructive chronic bronchitis without exacerbation ([place]) 10/16/2010  . Syncope 08/06/2010  . DIZZINESS 08/06/2010  . CAD, NATIVE VESSEL 01/27/2010  . CAD, ARTERY BYPASS GRAFT 01/27/2010  . OBESITY 01/24/2009    Past Surgical History:  Procedure Laterality Date  . [place]   "when I was in the [place]" (07/11/2013)  . CORONARY ANGIOPLASTY WITH STENT PLACEMENT  12/2011  . CORONARY ARTERY BYPASS GRAFT  1995   CABG X3  . ESOPHAGOGASTRODUODENOSCOPY N/A 07/12/2013   Procedure: ESOPHAGOGASTRODUODENOSCOPY (EGD);  Surgeon: Inda Castle, MD;  Location: [place];  Service: Endoscopy;  Laterality: N/A;  . EXCISIONAL HEMORRHOIDECTOMY     "twice cut them out; burnt them out once"  . FUNCTIONAL ENDOSCOPIC SINUS SURGERY  2006  . HYDROCELE EXCISION  2005  . INGUINAL HERNIA REPAIR Right 2006  . KNEE ARTHROSCOPY Right 1990's  . LEFT HEART CATHETERIZATION WITH CORONARY/GRAFT ANGIOGRAM  01/07/2012   Procedure: LEFT HEART CATHETERIZATION WITH Beatrix Fetters;  Surgeon: Peter M Martinique, MD;  Location: [facility] CATH LAB;  Service: Cardiovascular;;  . PACEMAKER IMPLANT N/A 12/14/2017   Procedure: PACEMAKER IMPLANT;  Surgeon: Constance Haw, MD;  Location: [place] CV LAB;  Service: Cardiovascular;  Laterality: N/A;  . PERCUTANEOUS CORONARY STENT INTERVENTION (PCI-S) N/A 01/10/2012   Procedure: PERCUTANEOUS CORONARY STENT INTERVENTION (PCI-S);  Surgeon: Peter M Martinique, MD;  Location: [facility] CATH LAB;  Service: Cardiovascular;  Laterality: N/A;  . [place] Medications    Prior to Admission medications   Medication Sig Start Date End Date Taking? Authorizing Provider  albuterol (PROVENTIL HFA;VENTOLIN HFA) 108 (90 BASE) MCG/ACT inhaler Inhale 2 puffs into the lungs every 6 (six) hours as needed for wheezing or shortness of breath.    [provider]    albuterol (PROVENTIL) (2.5 MG/3ML) 0.083% nebulizer solution Take 3 mLs (2.5 mg total) by nebulization every 6 (six) hours as needed for wheezing. 07/05/13   Bonnielee Haff, MD  ARTIFICIAL TEAR OP Place 2 drops into both eyes daily as needed. For dry eyes    [provider]  aspirin 81 MG tablet Take 1 tablet (81 mg total) by mouth daily. 08/10/13   Dunn, Nedra Hai, PA-C  atorvastatin (LIPITOR) 80 MG tablet Take 1 tablet (80 mg total) by mouth daily at 6 PM. Please make overdue appt with Dr. Acie Fredrickson before anymore refills. 2nd attempt 01/31/18   Nahser, Wonda Cheng, MD  budesonide-formoterol [facility]) 160-4.5 MCG/ACT inhaler Inhale 2 puffs into the lungs 2 (two) times daily.    [provider]  clopidogrel (PLAVIX) 75 MG tablet Take 1 tablet (75 mg total) by mouth daily  with breakfast. Please make overdue appt with Dr. Acie Fredrickson before anymore refills. 3rd attempt 04/21/18   Nahser, Wonda Cheng, MD  fluticasone [facility]) 50 MCG/ACT nasal spray Place 2 sprays into both nostrils daily. 10/13/14   Janece Canterbury, MD  furosemide (LASIX) 40 MG tablet Take 1.5 tablets (60 mg total) by mouth 2 (two) times daily. 06/19/15   Nahser, Wonda Cheng, MD  glimepiride (AMARYL) 2 MG tablet Take 2 mg by mouth daily with breakfast.    Lawerance Cruel, MD  isosorbide mononitrate (IMDUR) 30 MG 24 hr tablet Take 1 tablet (30 mg total) by mouth daily. Please make overdue appt with Dr. Acie Fredrickson before anymore refills. 3rd and Final attempt 03/21/18   Nahser, Wonda Cheng, MD  montelukast (SINGULAIR) 10 MG tablet Take 10 mg by mouth at bedtime.    [provider]  Multiple Vitamins-Minerals (MULTIVITAMIN & MINERAL PO) Take 1 tablet by mouth daily.      [provider]  nitroGLYCERIN (NITROSTAT) 0.4 MG SL tablet Place 1 tablet (0.4 mg total) under the tongue every 5 (five) minutes as needed for chest pain. 11/02/17   Nahser, Wonda Cheng, MD  pantoprazole (PROTONIX) 40 MG tablet Take 1 tablet (40 mg total) by mouth  daily. Patient overdue for an appointment. Needs to call and schedule for further refills 2nd attempt 11/02/17   Nahser, Wonda Cheng, MD  potassium chloride SA (K-DUR,KLOR-CON) 20 MEQ tablet Take 1 tablet (20 mEq total) by mouth 2 (two) times daily. Please make overdue appt with Dr.Nahser. 3rd and Final attempt 12/22/17   Nahser, Wonda Cheng, MD  tiotropium (SPIRIVA) 18 MCG inhalation capsule Place 1 capsule (18 mcg total) into inhaler and inhale daily. Patient not taking: Reported on 07/25/2018 07/05/13   Bonnielee Haff, MD    Family History Family History  Problem Relation Age of Onset  . CAD Father 16    Social History Social History   Tobacco Use  . Smoking status: Never Smoker  . Smokeless tobacco: Never Used  Substance Use Topics  . Alcohol use: Yes    Comment: prior alcohol, no sig use now  . Drug use: No     Allergies   Other   Review of Systems Review of Systems  Cardiovascular: Positive for syncope.  All other systems reviewed and are negative.    Physical Exam Updated Vital Signs BP (!) 164/99 (BP Location: Right Arm)   Pulse 94   Temp 98.1 F (36.7 C) (Oral)   Resp (!) 27   Wt 109.8 kg   SpO2 (!) 2%   BMI 33.75 kg/m   Physical Exam Vitals signs and nursing note reviewed.  Constitutional:      General: He is not in acute distress.    Appearance: He is well-developed. He is not diaphoretic.  HENT:     Head: Normocephalic and atraumatic.     Mouth/Throat:     Mouth: Mucous membranes are moist.  Neck:     Musculoskeletal: Normal range of motion and neck supple.  Cardiovascular:     Rate and Rhythm: Normal rate and regular rhythm.     Heart sounds: No murmur. No friction rub.  Pulmonary:     Effort: Pulmonary effort is normal. No respiratory distress.     Breath sounds: Normal breath sounds. No wheezing or rales.  Abdominal:     General: Bowel sounds are normal. There is no distension.     Palpations: Abdomen is soft.     Tenderness: There is no  abdominal tenderness.  Musculoskeletal: Normal range of motion.     Comments: There is a large superficial skin tear to the left elbow.  He has full range of motion and distal PMS is intact.  Skin:    General: Skin is warm and dry.  Neurological:     General: No focal deficit present.     Mental Status: He is alert and oriented to person, place, and time.     Cranial Nerves: No cranial nerve deficit.     Coordination: Coordination normal.      ED Treatments / Results  Labs (all labs ordered are listed, but only abnormal results are displayed) Labs Reviewed  CBC - Abnormal; Notable for the following components:      Result Value   Platelets 146 (*)    All other components within normal limits  BASIC METABOLIC PANEL  URINALYSIS, ROUTINE W REFLEX MICROSCOPIC  CBG MONITORING, ED    ED ECG REPORT   Date: 08/24/2018  Rate: 94  Rhythm: Ventricular paced  QRS Axis: indeterminate  Intervals: Indeterminate  ST/T Wave abnormalities: normal  Conduction Disutrbances:none  Narrative Interpretation:   Old EKG Reviewed: none available  I have personally reviewed the EKG tracing and agree with the computerized printout as noted.   Radiology No results found.  Procedures Procedures (including critical care time)  Medications Ordered in ED Medications  sodium chloride 0.9 % bolus 500 mL (has no administration in time range)  sodium chloride flush (NS) 0.9 % injection 3 mL (3 mLs Intravenous Given 08/24/18 0011)     Initial Impression / Assessment and Plan / ED Course  I have reviewed the triage vital signs and the nursing notes.  Pertinent labs & imaging results that were available during my care of the patient were reviewed by me and considered in my medical decision making (see chart for details).  Patient presents here after a syncopal episode.  He has no complaints other than a left elbow skin tear.  He tells me he has been experiencing these episodes for many years.  He  has been told that this is related to his blood pressure intermittently dropping.  Patient appears hemodynamically stable here in the ER.  Laboratory studies are reassuring no EKG is unchanged.  As he has been having these episodes for many years, I do not feel as though emergent hospitalization is indicated.  I feel as though he is appropriate for discharge and outpatient follow-up.  Final Clinical Impressions(s) / ED Diagnoses   Final diagnoses:  None    ED Discharge Orders    None       Veryl Speak, MD 08/24/18 [phone]

## 2018-10-03 ENCOUNTER — Telehealth: Payer: Self-pay | Admitting: Cardiology

## 2018-10-03 NOTE — Telephone Encounter (Signed)
She was calling to confirm dose of Lasix pt was taking.  Informed that pt has not had that medication filled through our office since 2016.  She appreciates the call back and will try and find who is prescribing now.

## 2018-10-03 NOTE — Telephone Encounter (Signed)
New Message          Beth with [facility] is calling to get the clarification on the patient's medication. Pls call to advise.

## 2018-10-25 ENCOUNTER — Ambulatory Visit (INDEPENDENT_AMBULATORY_CARE_PROVIDER_SITE_OTHER): Payer: Medicare Other | Admitting: *Deleted

## 2018-10-25 ENCOUNTER — Other Ambulatory Visit: Payer: Self-pay

## 2018-10-25 DIAGNOSIS — I441 Atrioventricular block, second degree: Secondary | ICD-10-CM

## 2018-10-25 LAB — CUP PACEART REMOTE DEVICE CHECK
Battery Remaining Longevity: 126 mo
Battery Voltage: 3.02 V
Brady Statistic AP VP Percent: 1.77 %
Brady Statistic AP VS Percent: 0.67 %
Brady Statistic AS VP Percent: 92.27 %
Brady Statistic AS VS Percent: 5.29 %
Brady Statistic RA Percent Paced: 2.47 %
Brady Statistic RV Percent Paced: 94.04 %
Date Time Interrogation Session: 20200408061301
Implantable Lead Implant Date: 20190529
Implantable Lead Implant Date: 20190529
Implantable Lead Location: 753859
Implantable Lead Location: 753860
Implantable Lead Model: 5076
Implantable Lead Model: 5076
Implantable Pulse Generator Implant Date: 20190529
Lead Channel Impedance Value: 285 Ohm
Lead Channel Impedance Value: 380 Ohm
Lead Channel Impedance Value: 399 Ohm
Lead Channel Impedance Value: 475 Ohm
Lead Channel Pacing Threshold Amplitude: 0.5 V
Lead Channel Pacing Threshold Amplitude: 0.625 V
Lead Channel Pacing Threshold Pulse Width: 0.4 ms
Lead Channel Pacing Threshold Pulse Width: 0.4 ms
Lead Channel Sensing Intrinsic Amplitude: 1.375 mV
Lead Channel Sensing Intrinsic Amplitude: 1.375 mV
Lead Channel Sensing Intrinsic Amplitude: 17.625 mV
Lead Channel Sensing Intrinsic Amplitude: 17.625 mV
Lead Channel Setting Pacing Amplitude: 2 V
Lead Channel Setting Pacing Amplitude: 2.5 V
Lead Channel Setting Pacing Pulse Width: 0.4 ms
Lead Channel Setting Sensing Sensitivity: 2 mV

## 2018-11-03 ENCOUNTER — Encounter: Payer: Self-pay | Admitting: Cardiology

## 2018-11-03 NOTE — Progress Notes (Signed)
Remote pacemaker transmission.   

## 2018-11-15 ENCOUNTER — Other Ambulatory Visit: Payer: Self-pay | Admitting: *Deleted

## 2018-11-21 ENCOUNTER — Other Ambulatory Visit: Payer: Self-pay | Admitting: Cardiovascular Disease

## 2019-01-24 ENCOUNTER — Ambulatory Visit (INDEPENDENT_AMBULATORY_CARE_PROVIDER_SITE_OTHER): Payer: Medicare Other | Admitting: *Deleted

## 2019-01-24 DIAGNOSIS — I441 Atrioventricular block, second degree: Secondary | ICD-10-CM

## 2019-01-24 LAB — CUP PACEART REMOTE DEVICE CHECK
Battery Remaining Longevity: 122 mo
Battery Voltage: 3.01 V
Brady Statistic AP VP Percent: 2.09 %
Brady Statistic AP VS Percent: 0.24 %
Brady Statistic AS VP Percent: 96.02 %
Brady Statistic AS VS Percent: 1.65 %
Brady Statistic RA Percent Paced: 2.26 %
Brady Statistic RV Percent Paced: 98.11 %
Date Time Interrogation Session: 20200708060207
Implantable Lead Implant Date: 20190529
Implantable Lead Implant Date: 20190529
Implantable Lead Location: 753859
Implantable Lead Location: 753860
Implantable Lead Model: 5076
Implantable Lead Model: 5076
Implantable Pulse Generator Implant Date: 20190529
Lead Channel Impedance Value: 285 Ohm
Lead Channel Impedance Value: 361 Ohm
Lead Channel Impedance Value: 456 Ohm
Lead Channel Impedance Value: 513 Ohm
Lead Channel Pacing Threshold Amplitude: 0.5 V
Lead Channel Pacing Threshold Amplitude: 0.875 V
Lead Channel Pacing Threshold Pulse Width: 0.4 ms
Lead Channel Pacing Threshold Pulse Width: 0.4 ms
Lead Channel Sensing Intrinsic Amplitude: 1.5 mV
Lead Channel Sensing Intrinsic Amplitude: 1.5 mV
Lead Channel Sensing Intrinsic Amplitude: 17.375 mV
Lead Channel Sensing Intrinsic Amplitude: 17.375 mV
Lead Channel Setting Pacing Amplitude: 2 V
Lead Channel Setting Pacing Amplitude: 2.5 V
Lead Channel Setting Pacing Pulse Width: 0.4 ms
Lead Channel Setting Sensing Sensitivity: 2 mV

## 2019-02-05 ENCOUNTER — Encounter: Payer: Self-pay | Admitting: Cardiology

## 2019-02-05 NOTE — Progress Notes (Signed)
Remote pacemaker transmission.   

## 2019-03-14 ENCOUNTER — Ambulatory Visit: Payer: Medicare Other | Admitting: Podiatry

## 2019-03-14 ENCOUNTER — Other Ambulatory Visit: Payer: Self-pay

## 2019-03-23 ENCOUNTER — Telehealth: Payer: Self-pay

## 2019-03-23 NOTE — Telephone Encounter (Signed)

## 2019-03-27 ENCOUNTER — Other Ambulatory Visit: Payer: Self-pay

## 2019-03-27 ENCOUNTER — Telehealth (INDEPENDENT_AMBULATORY_CARE_PROVIDER_SITE_OTHER): Payer: Medicare Other | Admitting: Cardiovascular Disease

## 2019-03-27 DIAGNOSIS — I2510 Atherosclerotic heart disease of native coronary artery without angina pectoris: Secondary | ICD-10-CM

## 2019-03-27 DIAGNOSIS — Z7189 Other specified counseling: Secondary | ICD-10-CM

## 2019-03-27 DIAGNOSIS — R55 Syncope and collapse: Secondary | ICD-10-CM

## 2019-03-27 NOTE — Progress Notes (Signed)
Virtual Visit via Telephone Note   This visit type was conducted due to national recommendations for restrictions regarding the COVID-19 Pandemic (e.g. social distancing) in an effort to limit this patient's exposure and mitigate transmission in our community.  Due to his co-morbid illnesses, this patient is at least at moderate risk for complications without adequate follow up.  This format is felt to be most appropriate for this patient at this time.  The patient did not have access to video technology/had technical difficulties with video requiring transitioning to audio format only (telephone).  All issues noted in this document were discussed and addressed.  No physical exam could be performed with this format.  Please refer to the patient's chart for his  consent to telehealth for [facility].   Date:  03/27/2019   ID:  [name], DOB [birth year], MRN [id number]  Patient Location: Home Provider Location: Home  PCP:  Lawerance Cruel, MD  Cardiologist:  Victor Langenbach Electrophysiologist:  Constance Haw, MD   Problem List 1. coronary artery disease-status post CABG 1995, status post DES in June, 2013 2. COPD- due to asthma ( never smoker)  on home O2,  3. Chronic kidney disease stage III 4. Hypertension 5. Chronic combined systolic and diastolic congestive heart failure EF = 45-50%.    Previous Notes [name] is a 85 y.o. male with a hx CAD s/p CABG 1995, NSTEMI treated with DES x 2 in 12/2011, COPD with chronic respiratory failure on home O2, probable CKD stage III, HTN, chronic diastolic CHF.  2D Echo 10/26/11: normal LV, EF 55-60%, mod dilated LA, calcified right and noncoronary commisure.    He was admitted to the hospital in December for a GI bleed. He was hospitalized in early December with COPD exacerbation (TnI neg). He returned to the hospital around Christmas with melena and ABL anemia. He required 2 units PRBC for Hgb into the 7 range. EGD confirmed a  prepyloric ulcer with path negative for malignancy. Hospital course was complicated by AKI (dc Cr 1.62). He likely has CKD as Cr in 2013 ran 1.3-1.7. Last Hgb on 07/26/13 was stable at 9.7 with negative FOBT. Aspirin and Plavix were held and restarted.    He was seen in the office for post hospital follow up on 08/07/13 and was volume overloaded (up 16 lbs) and complaining of chest pain with minimal activity.  He was admitted again [id number] with a/c diastolic CHF.   He was diuresed with IV Lasix.  Echo (08/07/2013):  Mod LVH, EF 50-55%, severe LaE, mild to mod reduced RVSF.  CEs were neg.  Chest pain resolved with diuresis.  He was kept on ASA and Plavix.  D/c weight 295.  Of note, ARB held at d/c due to low BP and AKI in setting of diuresis.    Since discharge, he is doing well. His breathing is overall improved. However, with COPD he continues with chronic NYHA class III dyspnea. He sleeps in a recliner chronically. He denies PND. LE edema is much improved. He denies chest pain. He denies syncope.  Dec 13, 2013:  [name] seems to be doing quite a bit better. His shortness breath eventually resolved after a prednisone taper.  He has a big garden - tomatoes, green beans, peas.    Walks on occasion.    Stays active in his garden.    His BP is a bit high today .  Typically it's normal.    Tries to avoid salt. He  eats at home most of the time ( son cooks).  He is retired as a Engineer, building services   Dec. 2, 2015:  [name] is seen today for follow up of his CAD , /hTN and chronic diastilic CHF. He is short of breath today.   wheezing significantly. His BP dropped recently . He was sen my his medical doctor and his hydralazine was stopped. He was also found to be volume overloaded and was started on metolazone 3 times a week.  January 29, 2016:  [name] is doing well.  Hx of CAD, HTN, and chronic diastolic dysfunction  Has had progressive leg weakness.   Uses a cane, walker or a wheelchair at  home. He was examined in the wheelchair today. No chest pain or shortness breath.  Has some constipation  Prednisone is listed on med list but he has not been taking it .  Has home O2 .  November 05, 2016:  [name] is seen today for follow up of his CAD and chronic diastolic CHF. Staying active.   Tries to stay active.  Medical doctor manages his lipids and writes his script  for Lipitor    Evaluation Performed:  Follow-Up Visit  Chief Complaint:  CAD   Sept. 8, 2020    [name] is a 85 y.o. male with HX of CAD  Has a hx of GI bleeding.  He also has a history of syncope and was found to have heart block.  He status post pacemaker implantation. Feels better with his pacer, Is active.  Walks in the morning .  -about a mile each day . No VS available today Does not drive anymore, daughter brings groceries. Avoids salty food    The patient does not have symptoms concerning for COVID-19 infection (fever, chills, cough, or new shortness of breath).    Past Medical History:  Diagnosis Date   Acute blood loss anemia    a. 06/2013 due to GIB.   Arthritis    "knees; hands" (07/11/2013)   Asthma    CAD (coronary artery disease)    a. s/p CABG x 3 in 1995; b.  NSTEMI 12/2011 -> Cath 6/21: LM 20-30, LAD 90, RI 95, LCX 56m, RCA 90/152m (treated w/ 2.0x16 Promus DES), VG->RCA ok, VG->RI 70-42m (treated w/ 4.0x28 Promus), LIMA->LAD ok, EF 55-60%.   Chronic diastolic CHF (congestive heart failure) ([place])    a. Echo 10/2011: EF 55-60%;  b. elevated EDP req diuresis.   Chronic respiratory failure ([place]) 07/11/2013   a. 3L home O2 24/7.   CKD (chronic kidney disease)    a. ?Based on Cr 2013 - 1.3-1.7.   COPD (chronic obstructive pulmonary disease) ([place])    a. on home O2   GERD (gastroesophageal reflux disease)    GI bleed    a. 06/2013 adm - prepyloric ulcer, path neg for malignancy. A/w ABL anemia.   HLD (hyperlipidemia)    HOH (hard of hearing)    left ear   HTN  (hypertension)    Hypothyroidism    Morbid obesity ([place])    Orthopnea    a. Has been sleeping in a recliner x 30 yrs.   Sleep apnea    Syncope    Past Surgical History:  Procedure Laterality Date   BACK SURGERY  1952   "when I was in the [place]" (07/11/2013)   CORONARY ANGIOPLASTY WITH STENT PLACEMENT  12/2011   CORONARY ARTERY BYPASS GRAFT  1995   CABG X3   ESOPHAGOGASTRODUODENOSCOPY  N/A 07/12/2013   Procedure: ESOPHAGOGASTRODUODENOSCOPY (EGD);  Surgeon: Inda Castle, MD;  Location: [place];  Service: Endoscopy;  Laterality: N/A;   EXCISIONAL HEMORRHOIDECTOMY     "twice cut them out; burnt them out once"   FUNCTIONAL ENDOSCOPIC SINUS SURGERY  2006   HYDROCELE EXCISION  2005   INGUINAL HERNIA REPAIR Right 2006   KNEE ARTHROSCOPY Right 1990's   LEFT HEART CATHETERIZATION WITH CORONARY/GRAFT ANGIOGRAM  01/07/2012   Procedure: LEFT HEART CATHETERIZATION WITH Beatrix Fetters;  Surgeon: Peter M Martinique, MD;  Location: [facility] CATH LAB;  Service: Cardiovascular;;   PACEMAKER IMPLANT N/A 12/14/2017   Procedure: PACEMAKER IMPLANT;  Surgeon: Constance Haw, MD;  Location: [place] CV LAB;  Service: Cardiovascular;  Laterality: N/A;   PERCUTANEOUS CORONARY STENT INTERVENTION (PCI-S) N/A 01/10/2012   Procedure: PERCUTANEOUS CORONARY STENT INTERVENTION (PCI-S);  Surgeon: Peter M Martinique, MD;  Location: [facility] CATH LAB;  Service: Cardiovascular;  Laterality: N/A;   PROSTATE SURGERY  1998     No outpatient medications have been marked as taking for the 03/27/19 encounter (Appointment) with Jonan Seufert, Wonda Cheng, MD.     Allergies:   Other   Social History   Tobacco Use   Smoking status: Never Smoker   Smokeless tobacco: Never Used  Substance Use Topics   Alcohol use: Yes    Comment: prior alcohol, no sig use now   Drug use: No     Family Hx: The patient's family history includes CAD (age of onset: 58) in his father.  ROS:   Please see the history of  present illness.     All other systems reviewed and are negative.   Prior CV studies:   The following studies were reviewed today:    Labs/Other Tests and Data Reviewed:    EKG:  No ECG reviewed.  Recent Labs: 07/25/2018: ALT 24 08/24/2018: BUN 22; Creatinine, Ser 1.51; Hemoglobin 14.3; Platelets 146; Potassium 4.0; Sodium 138   Recent Lipid Panel Lab Results  Component Value Date/Time   CHOL 118 12/13/2017 04:42 AM   TRIG 82 12/13/2017 04:42 AM   HDL 41 12/13/2017 04:42 AM   CHOLHDL 2.9 12/13/2017 04:42 AM   LDLCALC 61 12/13/2017 04:42 AM    Wt Readings from Last 3 Encounters:  08/23/18 242 lb (109.8 kg)  04/26/18 251 lb (113.9 kg)  12/15/17 266 lb 15.6 oz (121.1 kg)     Objective:    Vital Signs:  There were no vitals taken for this visit.     ASSESSMENT & PLAN:    1. CAD :     Demontra seems to be doing well.  Is not having any episodes of angina.  Continue current medications  2.  Syncope: He has not had any further episodes of syncope following his pacemaker implantation.  3.  Hyperlipidemia: Continue atorvastatin.  Will check labs in 6 months.  COVID-19 Education: The signs and symptoms of COVID-19 were discussed with the patient and how to seek care for testing (follow up with PCP or arrange E-visit).  The importance of social distancing was discussed today.  Time:   Today, I have spent  17 minutes with the patient with telehealth technology discussing the above problems.     Medication Adjustments/Labs and Tests Ordered: Current medicines are reviewed at length with the patient today.  Concerns regarding medicines are outlined above.   Tests Ordered: No orders of the defined types were placed in this encounter.   Medication Changes: No orders of the defined  types were placed in this encounter.   Follow Up:  In Person in 6 month(s) with an APP   Signed, Mertie Moores, MD  03/27/2019 8:37 AM    [place]

## 2019-03-28 NOTE — Patient Instructions (Signed)
Medication Instructions:  Your physician recommends that you continue on your current medications as directed. Please refer to the Current Medication list given to you today.  Labwork: You will get lab work when you come for your next appointment:  BMP, lipid profile and liver enzymes.  Testing/Procedures: None ordered.  Follow-Up: Your physician wants you to follow-up in: 6 months with an APP from Dr. Elmarie Shiley team.   Dennis Bast will receive a reminder letter in the mail two months in advance. If you don't receive a letter, please call our office to schedule the follow-up appointment.   Any Other Special Instructions Will Be Listed Below (If Applicable).  If you need a refill on your cardiac medications before your next appointment, please call your pharmacy.

## 2019-04-25 ENCOUNTER — Ambulatory Visit (INDEPENDENT_AMBULATORY_CARE_PROVIDER_SITE_OTHER): Payer: Medicare Other | Admitting: *Deleted

## 2019-04-25 DIAGNOSIS — R55 Syncope and collapse: Secondary | ICD-10-CM | POA: Diagnosis not present

## 2019-04-25 DIAGNOSIS — I441 Atrioventricular block, second degree: Secondary | ICD-10-CM

## 2019-04-26 LAB — CUP PACEART REMOTE DEVICE CHECK
Battery Remaining Longevity: 119 mo
Battery Voltage: 3 V
Brady Statistic AP VP Percent: 1.3 %
Brady Statistic AP VS Percent: 0.52 %
Brady Statistic AS VP Percent: 90.7 %
Brady Statistic AS VS Percent: 7.48 %
Brady Statistic RA Percent Paced: 1.65 %
Brady Statistic RV Percent Paced: 92 %
Date Time Interrogation Session: 20201007060153
Implantable Lead Implant Date: 20190529
Implantable Lead Implant Date: 20190529
Implantable Lead Location: 753859
Implantable Lead Location: 753860
Implantable Lead Model: 5076
Implantable Lead Model: 5076
Implantable Pulse Generator Implant Date: 20190529
Lead Channel Impedance Value: 285 Ohm
Lead Channel Impedance Value: 380 Ohm
Lead Channel Impedance Value: 437 Ohm
Lead Channel Impedance Value: 456 Ohm
Lead Channel Pacing Threshold Amplitude: 0.625 V
Lead Channel Pacing Threshold Amplitude: 0.75 V
Lead Channel Pacing Threshold Pulse Width: 0.4 ms
Lead Channel Pacing Threshold Pulse Width: 0.4 ms
Lead Channel Sensing Intrinsic Amplitude: 1.25 mV
Lead Channel Sensing Intrinsic Amplitude: 1.25 mV
Lead Channel Sensing Intrinsic Amplitude: 16.375 mV
Lead Channel Sensing Intrinsic Amplitude: 16.375 mV
Lead Channel Setting Pacing Amplitude: 2 V
Lead Channel Setting Pacing Amplitude: 2.5 V
Lead Channel Setting Pacing Pulse Width: 0.4 ms
Lead Channel Setting Sensing Sensitivity: 2 mV

## 2019-05-07 NOTE — Progress Notes (Signed)
Remote pacemaker transmission.   

## 2019-07-25 ENCOUNTER — Ambulatory Visit (INDEPENDENT_AMBULATORY_CARE_PROVIDER_SITE_OTHER): Payer: Medicare HMO | Admitting: *Deleted

## 2019-07-25 DIAGNOSIS — R55 Syncope and collapse: Secondary | ICD-10-CM | POA: Diagnosis not present

## 2019-07-26 LAB — CUP PACEART REMOTE DEVICE CHECK
Battery Remaining Longevity: 116 mo
Battery Voltage: 3 V
Brady Statistic AP VP Percent: 1.33 %
Brady Statistic AP VS Percent: 0.14 %
Brady Statistic AS VP Percent: 97.48 %
Brady Statistic AS VS Percent: 1.05 %
Brady Statistic RA Percent Paced: 1.41 %
Brady Statistic RV Percent Paced: 98.81 %
Date Time Interrogation Session: 20210106010211
Implantable Lead Implant Date: 20190529
Implantable Lead Implant Date: 20190529
Implantable Lead Location: 753859
Implantable Lead Location: 753860
Implantable Lead Model: 5076
Implantable Lead Model: 5076
Implantable Pulse Generator Implant Date: 20190529
Lead Channel Impedance Value: 285 Ohm
Lead Channel Impedance Value: 361 Ohm
Lead Channel Impedance Value: 456 Ohm
Lead Channel Impedance Value: 513 Ohm
Lead Channel Pacing Threshold Amplitude: 0.5 V
Lead Channel Pacing Threshold Amplitude: 0.875 V
Lead Channel Pacing Threshold Pulse Width: 0.4 ms
Lead Channel Pacing Threshold Pulse Width: 0.4 ms
Lead Channel Sensing Intrinsic Amplitude: 1.5 mV
Lead Channel Sensing Intrinsic Amplitude: 1.5 mV
Lead Channel Sensing Intrinsic Amplitude: 18.875 mV
Lead Channel Sensing Intrinsic Amplitude: 18.875 mV
Lead Channel Setting Pacing Amplitude: 2 V
Lead Channel Setting Pacing Amplitude: 2.5 V
Lead Channel Setting Pacing Pulse Width: 0.4 ms
Lead Channel Setting Sensing Sensitivity: 2 mV

## 2019-09-21 ENCOUNTER — Emergency Department (HOSPITAL_COMMUNITY): Payer: Medicare Other

## 2019-09-21 ENCOUNTER — Emergency Department (HOSPITAL_COMMUNITY)
Admission: EM | Admit: 2019-09-21 | Discharge: 2019-09-21 | Disposition: A | Discharge status: Home / Self Care | Payer: Medicare Other | Attending: Emergency Medicine | Admitting: Emergency Medicine

## 2019-09-21 ENCOUNTER — Other Ambulatory Visit: Payer: Self-pay

## 2019-09-21 DIAGNOSIS — Z79899 Other long term (current) drug therapy: Secondary | ICD-10-CM | POA: Insufficient documentation

## 2019-09-21 DIAGNOSIS — R531 Weakness: Secondary | ICD-10-CM | POA: Diagnosis not present

## 2019-09-21 DIAGNOSIS — I130 Hypertensive heart and chronic kidney disease with heart failure and stage 1 through stage 4 chronic kidney disease, or unspecified chronic kidney disease: Secondary | ICD-10-CM | POA: Diagnosis not present

## 2019-09-21 DIAGNOSIS — I2510 Atherosclerotic heart disease of native coronary artery without angina pectoris: Secondary | ICD-10-CM | POA: Diagnosis not present

## 2019-09-21 DIAGNOSIS — J449 Chronic obstructive pulmonary disease, unspecified: Secondary | ICD-10-CM | POA: Diagnosis not present

## 2019-09-21 DIAGNOSIS — Y92002 Bathroom of unspecified non-institutional (private) residence single-family (private) house as the place of occurrence of the external cause: Secondary | ICD-10-CM | POA: Diagnosis not present

## 2019-09-21 DIAGNOSIS — N183 Chronic kidney disease, stage 3 unspecified: Secondary | ICD-10-CM | POA: Insufficient documentation

## 2019-09-21 DIAGNOSIS — E039 Hypothyroidism, unspecified: Secondary | ICD-10-CM | POA: Diagnosis not present

## 2019-09-21 DIAGNOSIS — Y939 Activity, unspecified: Secondary | ICD-10-CM | POA: Insufficient documentation

## 2019-09-21 DIAGNOSIS — W19XXXA Unspecified fall, initial encounter: Secondary | ICD-10-CM | POA: Diagnosis not present

## 2019-09-21 DIAGNOSIS — Z20822 Contact with and (suspected) exposure to covid-19: Secondary | ICD-10-CM | POA: Diagnosis not present

## 2019-09-21 DIAGNOSIS — R410 Disorientation, unspecified: Secondary | ICD-10-CM | POA: Insufficient documentation

## 2019-09-21 DIAGNOSIS — Z7982 Long term (current) use of aspirin: Secondary | ICD-10-CM | POA: Diagnosis not present

## 2019-09-21 DIAGNOSIS — I5032 Chronic diastolic (congestive) heart failure: Secondary | ICD-10-CM | POA: Insufficient documentation

## 2019-09-21 DIAGNOSIS — R441 Visual hallucinations: Secondary | ICD-10-CM | POA: Diagnosis not present

## 2019-09-21 DIAGNOSIS — Y999 Unspecified external cause status: Secondary | ICD-10-CM | POA: Insufficient documentation

## 2019-09-21 LAB — CBC WITH DIFFERENTIAL/PLATELET
Abs Immature Granulocytes: 0.02 10*3/uL (ref 0.00–0.07)
Basophils Absolute: 0 10*3/uL (ref 0.0–0.1)
Basophils Relative: 0 %
Eosinophils Absolute: 0 10*3/uL (ref 0.0–0.5)
Eosinophils Relative: 0 %
HCT: 53.2 % — ABNORMAL HIGH (ref 39.0–52.0)
Hemoglobin: 16.7 g/dL (ref 13.0–17.0)
Immature Granulocytes: 0 %
Lymphocytes Relative: 7 %
Lymphs Abs: 0.7 10*3/uL (ref 0.7–4.0)
MCH: 30.5 pg (ref 26.0–34.0)
MCHC: 31.4 g/dL (ref 30.0–36.0)
MCV: 97.1 fL (ref 80.0–100.0)
Monocytes Absolute: 0.4 10*3/uL (ref 0.1–1.0)
Monocytes Relative: 4 %
Neutro Abs: 8.4 10*3/uL — ABNORMAL HIGH (ref 1.7–7.7)
Neutrophils Relative %: 89 %
Platelets: 161 10*3/uL (ref 150–400)
RBC: 5.48 MIL/uL (ref 4.22–5.81)
RDW: 13.4 % (ref 11.5–15.5)
WBC: 9.5 10*3/uL (ref 4.0–10.5)
nRBC: 0 % (ref 0.0–0.2)

## 2019-09-21 LAB — COMPREHENSIVE METABOLIC PANEL
ALT: 23 U/L (ref 0–44)
AST: 54 U/L — ABNORMAL HIGH (ref 15–41)
Albumin: 3.8 g/dL (ref 3.5–5.0)
Alkaline Phosphatase: 74 U/L (ref 38–126)
Anion gap: 9 (ref 5–15)
BUN: 34 mg/dL — ABNORMAL HIGH (ref 8–23)
CO2: 31 mmol/L (ref 22–32)
Calcium: 8.6 mg/dL — ABNORMAL LOW (ref 8.9–10.3)
Chloride: 100 mmol/L (ref 98–111)
Creatinine, Ser: 1.58 mg/dL — ABNORMAL HIGH (ref 0.61–1.24)
GFR calc Af Amer: 45 mL/min — ABNORMAL LOW (ref >60)
GFR calc non Af Amer: 39 mL/min — ABNORMAL LOW (ref >60)
Glucose, Bld: 90 mg/dL (ref 70–99)
Potassium: 4.1 mmol/L (ref 3.5–5.1)
Sodium: 140 mmol/L (ref 135–145)
Total Bilirubin: 1.4 mg/dL — ABNORMAL HIGH (ref 0.3–1.2)
Total Protein: 7.4 g/dL (ref 6.5–8.1)

## 2019-09-21 LAB — URINALYSIS, ROUTINE W REFLEX MICROSCOPIC
Bacteria, UA: NONE SEEN
Bilirubin Urine: NEGATIVE
Glucose, UA: NEGATIVE mg/dL
Ketones, ur: 20 mg/dL — AB
Leukocytes,Ua: NEGATIVE
Nitrite: NEGATIVE
Protein, ur: 100 mg/dL — AB
Specific Gravity, Urine: 1.021 (ref 1.005–1.030)
pH: 5 (ref 5.0–8.0)

## 2019-09-21 MED ORDER — SODIUM CHLORIDE 0.9 % IV BOLUS
500.0000 mL | Freq: Once | INTRAVENOUS | Status: AC
  Administered 2019-09-21: 14:00:00 500 mL via INTRAVENOUS

## 2019-09-21 MED ORDER — SODIUM CHLORIDE 0.9 % IV BOLUS
500.0000 mL | Freq: Once | INTRAVENOUS | Status: AC
  Administered 2019-09-21: 13:00:00 500 mL via INTRAVENOUS

## 2019-09-21 NOTE — ED Provider Notes (Signed)
[facility] EMERGENCY DEPARTMENT Provider Note   CSN: [id number] Arrival date & time: 09/21/19  1025     History Chief Complaint  Patient presents with  . Fall    [name] is a 86 y.o. male.  Level 5 caveat for altered mental status.  Most of history obtained from daughter.  Daughter arrived at the patient's home and found the shower running and the patient lying on the floor.  Uncertain length of time.  Also reports confusion and visual hallucinations.  Per daughter's history, patient has been confused for a couple of years.  No fever, chills, dysuria, cough, chest pain, dysuria.        Past Medical History:  Diagnosis Date  . Acute blood loss anemia    a. 06/2013 due to GIB.  Marland Kitchen Arthritis    "knees; hands" (07/11/2013)  . Asthma   . CAD (coronary artery disease)    a. s/p CABG x 3 in 1995; b.  NSTEMI 12/2011 -> Cath 6/21: LM 20-30, LAD 90, RI 95, LCX 51m, RCA 90/146m (treated w/ 2.0x16 Promus DES), VG->RCA ok, VG->RI 70-92m (treated w/ 4.0x28 Promus), LIMA->LAD ok, EF 55-60%.  . Chronic diastolic CHF (congestive heart failure) ([place])    a. Echo 10/2011: EF 55-60%;  b. elevated EDP req diuresis.  . Chronic respiratory failure ([place]) 07/11/2013   a. 3L home O2 24/7.  Marland Kitchen CKD (chronic kidney disease)    a. ?Based on Cr 2013 - 1.3-1.7.  . COPD (chronic obstructive pulmonary disease) ([place])    a. on home O2  . GERD (gastroesophageal reflux disease)   . GI bleed    a. 06/2013 adm - prepyloric ulcer, path neg for malignancy. A/w ABL anemia.  Marland Kitchen HLD (hyperlipidemia)   . HOH (hard of hearing)    left ear  . HTN (hypertension)   . Hypothyroidism   . Morbid obesity ([place])   . Orthopnea    a. Has been sleeping in a recliner x 30 yrs.  . Sleep apnea   . Syncope     Patient Active Problem List   Diagnosis Date Noted  . Second degree AV block   . 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 GERD (gastroesophageal reflux disease) 12/12/2017  . Fall 12/12/2017  . COPD with acute  exacerbation ([place]) 09/12/2017  . Chronic combined systolic and diastolic CHF (congestive heart failure) ([place]) 01/29/2016  . History of recent fall 10/17/2015  . Risk for falls 10/17/2015  . Recurrent falls 10/17/2015  . Chest pain at rest   . DNR (do not resuscitate) 10/23/2014  . Thrush, oral 10/10/2014  . Sinusitis, chronic 10/07/2014  . Bronchitis, acute 10/06/2014  . Acute kidney injury ([place])   . Atrial fibrillation ([place])   . Acute on chronic diastolic heart failure ([place]) 10/04/2014  . Chest pain 10/04/2014  . New onset atrial fibrillation ([place]) 10/04/2014  . COPD exacerbation ([place]) 10/04/2014  . COLD (chronic obstructive lung disease) ([place])   . 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 GI bleed   . Acute on chronic diastolic CHF (congestive heart failure) ([place]) 08/07/2013  . COPD (chronic obstructive pulmonary disease) ([place]) 08/07/2013  . CKD (chronic kidney disease), stage III 08/07/2013  . Acute blood loss anemia   . Elevated serum creatinine 07/11/2013  . Chronic respiratory failure ([place]) 07/11/2013  . Chronic diastolic CHF (congestive heart failure) ([place])   . CAD (coronary artery disease)   . HTN (hypertension)   . HLD (hyperlipidemia)   . History of acute myocardial  infarction 01/06/2012  . Type II diabetes mellitus with renal manifestations ([place]) 01/06/2012  . Obstructive chronic bronchitis without exacerbation ([place]) 10/16/2010  . Syncope 08/06/2010  . DIZZINESS 08/06/2010  . CAD, NATIVE VESSEL 01/27/2010  . CAD, ARTERY BYPASS GRAFT 01/27/2010  . OBESITY 01/24/2009    Past Surgical History:  Procedure Laterality Date  . [place]   "when I was in the [facility]" (07/11/2013)  . CORONARY ANGIOPLASTY WITH STENT PLACEMENT  12/2011  . CORONARY ARTERY BYPASS GRAFT  1995   CABG X3  . ESOPHAGOGASTRODUODENOSCOPY N/A 07/12/2013   Procedure: ESOPHAGOGASTRODUODENOSCOPY (EGD);  Surgeon: Inda Castle, MD;  Location: [facility];  Service: Endoscopy;  Laterality:  N/A;  . EXCISIONAL HEMORRHOIDECTOMY     "twice cut them out; burnt them out once"  . FUNCTIONAL ENDOSCOPIC SINUS SURGERY  2006  . HYDROCELE EXCISION  2005  . INGUINAL HERNIA REPAIR Right 2006  . KNEE ARTHROSCOPY Right 1990's  . LEFT HEART CATHETERIZATION WITH CORONARY/GRAFT ANGIOGRAM  01/07/2012   Procedure: LEFT HEART CATHETERIZATION WITH Beatrix Fetters;  Surgeon: Peter M Martinique, MD;  Location[facility] CATH LAB;  Service: Cardiovascular;;  . PACEMAKER IMPLANT N/A 12/14/2017   Procedure: PACEMAKER IMPLANT;  Surgeon: Constance Haw, MD;  Location: [facility] CV LAB;  Service: Cardiovascular;  Laterality: N/A;  . PERCUTANEOUS CORONARY STENT INTERVENTION (PCI-S) N/A 01/10/2012   Procedure: PERCUTANEOUS CORONARY STENT INTERVENTION (PCI-S);  Surgeon: Peter M Martinique, MD;  Location: [facility] CATH LAB;  Service: Cardiovascular;  Laterality: N/A;  . PROSTATE SURGERY  1998       Family History  Problem Relation Age of Onset  . CAD Father 29    Social History   Tobacco Use  . Smoking status: Never Smoker  . Smokeless tobacco: Never Used  Substance Use Topics  . Alcohol use: Yes    Comment: prior alcohol, no sig use now  . Drug use: No    Home Medications Prior to Admission medications   Medication Sig Start Date End Date Taking? Authorizing Provider  albuterol (PROVENTIL HFA;VENTOLIN HFA) 108 (90 BASE) MCG/ACT inhaler Inhale 2 puffs into the lungs every 6 (six) hours as needed for wheezing or shortness of breath.   Yes [provider]  albuterol (PROVENTIL) (2.5 MG/3ML) 0.083% nebulizer solution Take 3 mLs (2.5 mg total) by nebulization every 6 (six) hours as needed for wheezing. 07/05/13  Yes Bonnielee Haff, MD  ARTIFICIAL TEAR OP Place 2 drops into both eyes daily as needed. For dry eyes   Yes [provider]  aspirin 81 MG tablet Take 1 tablet (81 mg total) by mouth daily. 08/10/13  Yes Dunn, Dayna N, PA-C  atorvastatin (LIPITOR) 80 MG tablet Take 1 tablet (80 mg  total) by mouth daily at 6 PM. Please make overdue appt with Dr. Acie Fredrickson before anymore refills. 2nd attempt 01/31/18  Yes Nahser, Wonda Cheng, MD  budesonide-formoterol [facility]) 160-4.5 MCG/ACT inhaler Inhale 2 puffs into the lungs 2 (two) times daily.   Yes [provider]  clopidogrel (PLAVIX) 75 MG tablet Take 1 tablet (75 mg total) by mouth daily with breakfast. Please make overdue appt with Dr. Acie Fredrickson before anymore refills. 3rd attempt 04/21/18  Yes Nahser, Wonda Cheng, MD  fluticasone [facility]) 50 MCG/ACT nasal spray Place 2 sprays into both nostrils daily. 10/13/14  Yes Short, Noah Delaine, MD  furosemide (LASIX) 40 MG tablet Take 1.5 tablets (60 mg total) by mouth 2 (two) times daily. 06/19/15  Yes Nahser, Wonda Cheng, MD  glimepiride [facility])  2 MG tablet Take 2 mg by mouth daily with breakfast.   Yes Lawerance Cruel, MD  isosorbide mononitrate (IMDUR) 30 MG 24 hr tablet Take 1 tablet (30 mg total) by mouth daily. Please make overdue appt with Dr. Acie Fredrickson before anymore refills. 3rd and Final attempt 03/21/18  Yes Nahser, Wonda Cheng, MD  montelukast (SINGULAIR) 10 MG tablet Take 10 mg by mouth at bedtime.   Yes [provider]  Multiple Vitamins-Minerals (MULTIVITAMIN & MINERAL PO) Take 1 tablet by mouth daily.     Yes [provider]  nitroGLYCERIN (NITROSTAT) 0.4 MG SL tablet Place 1 tablet (0.4 mg total) under the tongue every 5 (five) minutes as needed for chest pain. 11/02/17  Yes Nahser, Wonda Cheng, MD  pantoprazole (PROTONIX) 40 MG tablet TAKE 1 TABLET BY MOUTH  DAILY 11/21/18  Yes Nahser, Wonda Cheng, MD  potassium chloride SA (K-DUR,KLOR-CON) 20 MEQ tablet Take 1 tablet (20 mEq total) by mouth 2 (two) times daily. Please make overdue appt with Dr.Nahser. 3rd and Final attempt 12/22/17  Yes Nahser, Wonda Cheng, MD  tiotropium (SPIRIVA) 18 MCG inhalation capsule Place 1 capsule (18 mcg total) into inhaler and inhale daily. 07/05/13  Yes Bonnielee Haff, MD    Allergies     Other  Review of Systems   Review of Systems  Unable to perform ROS: Mental status change    Physical Exam Updated Vital Signs BP (!) 161/93   Pulse 95   Temp (!) 97.5 F (36.4 C) (Oral)   Resp 18   Ht 5\' 11"  (1.803 m)   Wt 90.7 kg   SpO2 100%   BMI 27.89 kg/m   Physical Exam Vitals and nursing note reviewed.  Constitutional:      Appearance: He is well-developed.     Comments: Patient is able to conduct a reasonable conversation.  No somatic complaints.  HENT:     Head: Normocephalic and atraumatic.  Eyes:     Conjunctiva/sclera: Conjunctivae normal.  Cardiovascular:     Rate and Rhythm: Normal rate and regular rhythm.  Pulmonary:     Effort: Pulmonary effort is normal.     Breath sounds: Normal breath sounds.  Abdominal:     General: Bowel sounds are normal.     Palpations: Abdomen is soft.  Musculoskeletal:        General: Normal range of motion.     Cervical back: Neck supple.  Skin:    General: Skin is warm and dry.  Neurological:     General: No focal deficit present.     Mental Status: He is alert.  Psychiatric:        Behavior: Behavior normal.     Comments: Uncertain baseline     ED Results / Procedures / Treatments   Labs (all labs ordered are listed, but only abnormal results are displayed) Labs Reviewed  CBC WITH DIFFERENTIAL/PLATELET - Abnormal; Notable for the following components:      Result Value   HCT 53.2 (*)    Neutro Abs 8.4 (*)    All other components within normal limits  COMPREHENSIVE METABOLIC PANEL - Abnormal; Notable for the following components:   BUN 34 (*)    Creatinine, Ser 1.58 (*)    Calcium 8.6 (*)    AST 54 (*)    Total Bilirubin 1.4 (*)    GFR calc non Af Amer 39 (*)    GFR calc Af Amer 45 (*)    All other components within normal  limits  URINALYSIS, ROUTINE W REFLEX MICROSCOPIC - Abnormal; Notable for the following components:   Hgb urine dipstick LARGE (*)    Ketones, ur 20 (*)    Protein, ur 100 (*)     All other components within normal limits  SARS CORONAVIRUS 2 (TAT 6-24 HRS)    EKG None  Radiology CT Head Wo Contrast  Result Date: 09/21/2019 CLINICAL DATA:  Increased confusion over the past week. EXAM: CT HEAD WITHOUT CONTRAST TECHNIQUE: Contiguous axial images were obtained from the base of the skull through the vertex without intravenous contrast. COMPARISON:  Head CT scan 07/25/2018. FINDINGS: Brain: No evidence of acute infarction, hemorrhage, hydrocephalus, extra-axial collection or mass lesion/mass effect. There is some atrophy and chronic microvascular ischemic change. Vascular: No hyperdense vessel or unexpected calcification. Skull: Intact.  No focal lesion. Sinuses/Orbits: Sinonasal polyposis on the right again seen. Other: None. IMPRESSION: No acute abnormality. Atrophy and chronic microvascular ischemic change. Sinonasal polyposis on the right. Electronically Signed   By: Inge Rise M.D.   On: 09/21/2019 13:10   DG Chest Port 1 View  Result Date: 09/21/2019 CLINICAL DATA:  Altered mental status. Status post fall last night. EXAM: PORTABLE CHEST 1 VIEW COMPARISON:  PA and lateral chest 07/25/2018 and 12/15/2017. FINDINGS: The patient is status post CABG with a pacing device in place. Heart size is mildly enlarged. There is vascular congestion. No consolidative process, pneumothorax or effusion. IMPRESSION: No acute disease. Electronically Signed   By: Inge Rise M.D.   On: 09/21/2019 12:25    Procedures Procedures (including critical care time)  Medications Ordered in ED Medications  sodium chloride 0.9 % bolus 500 mL (0 mLs Intravenous Stopped 09/21/19 1339)    ED Course  I have reviewed the triage vital signs and the nursing notes.  Pertinent labs & imaging results that were available during my care of the patient were reviewed by me and considered in my medical decision making (see chart for details).    MDM Rules/Calculators/A&P                       Discussed clinical situation with his son Leodan at [phone].  Patient will be discharged home with the caveat of increased home health services.  Son agrees to this treatment plan. Final Clinical Impression(s) / ED Diagnoses Final diagnoses:  Fall, initial encounter  Weakness    Rx / DC Orders ED Discharge Orders    None       Nat Christen, MD 09/26/19 [phone]

## 2019-09-21 NOTE — ED Triage Notes (Signed)
Ems reports pt lives at home alone and fell some time during the night in the bathroom.  Reports was unable to get up on his own.  Family came over and called ems.  Home health nurse also came over and encouraged pt to go to the ER.  Pt has history of copd and wears o2 at 3 liters continuously.  Reports he did not have it on all night after he fell.  WHen ems arrived they were unable to obtain o2 sat but placed pt on o2 at 4liters.  PT alert and oriented.  Pt alert and oriented at this time.  Pt says he was only in the floor for approx 1 hour.  CBG 108 per ems.  Pt also has pacemaker.

## 2019-09-21 NOTE — Discharge Instructions (Addendum)
We checked many things today including blood work, chest x-ray, urine sample, CT head.  None of these tests show any life-threatening condition.  Covid test is pending.  Rest.  Try to eat regular meals.  Follow-up with your primary care doctor or return if worse.

## 2019-09-21 NOTE — ED Notes (Signed)
Pt's daughter called and said pt has some confusion but sounds very lucid when you talk to him.  Reports has had increased confusion this week.  She said she arrived to find pt laying in the floor of the bathroom with shower running.  Unsure how long pt had been there but feels like it was longer the hour that pt told ER staff.  Daughter says pt has no concept of time. ALso says pt has been hallucinating.  Pt called his pcp yesterday telling him about "all the people in his house."   Daughter says his confusion has been going on for 2 years.  Pt's Daugther:  Jerilynn Birkenhead, [phone].

## 2019-09-22 LAB — SARS CORONAVIRUS 2 (TAT 6-24 HRS): SARS Coronavirus 2: NEGATIVE

## 2019-10-01 NOTE — ED Provider Notes (Signed)
[facility] EMERGENCY DEPARTMENT Provider Note   CSN: [id number] Arrival date & time: 09/21/19  1025     History Chief Complaint  Patient presents with  . Fall    [name] is a 86 y.o. male.  Level 5 caveat for altered mental status.  History obtained from EMS and family.  Patient felt and was on the floor for approximately 1 hour.  He was unable to get up.  Daughter reports increasing confusion over the past 2 years, worse recently.  No prodromal illnesses.  No obvious Covid exposures.  He has COPD and is on 3 L of nasal cannula on a regular basis.  Patient declares no specific somatic complaints.  Specifically no extremity pain, chest pain, dyspnea, dysuria, fever, sweats, chills.        Past Medical History:  Diagnosis Date  . Acute blood loss anemia    a. 06/2013 due to GIB.  Marland Kitchen Arthritis    "knees; hands" (07/11/2013)  . Asthma   . CAD (coronary artery disease)    a. s/p CABG x 3 in 1995; b.  NSTEMI 12/2011 -> Cath 6/21: LM 20-30, LAD 90, RI 95, LCX 81m, RCA 90/123m (treated w/ 2.0x16 Promus DES), VG->RCA ok, VG->RI 70-24m (treated w/ 4.0x28 Promus), LIMA->LAD ok, EF 55-60%.  . Chronic diastolic CHF (congestive heart failure) ([place])    a. Echo 10/2011: EF 55-60%;  b. elevated EDP req diuresis.  . Chronic respiratory failure ([place]) 07/11/2013   a. 3L home O2 24/7.  Marland Kitchen CKD (chronic kidney disease)    a. ?Based on Cr 2013 - 1.3-1.7.  . COPD (chronic obstructive pulmonary disease) ([place])    a. on home O2  . GERD (gastroesophageal reflux disease)   . GI bleed    a. 06/2013 adm - prepyloric ulcer, path neg for malignancy. A/w ABL anemia.  Marland Kitchen HLD (hyperlipidemia)   . HOH (hard of hearing)    left ear  . HTN (hypertension)   . Hypothyroidism   . Morbid obesity ([place])   . Orthopnea    a. Has been sleeping in a recliner x 30 yrs.  . Sleep apnea   . Syncope     Patient Active Problem List   Diagnosis Date Noted  . Second degree AV block   . 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 GERD (gastroesophageal reflux disease) 12/12/2017  . Fall 12/12/2017  . COPD with acute exacerbation ([place]) 09/12/2017  . Chronic combined systolic and diastolic CHF (congestive heart failure) ([place]) 01/29/2016  . History of recent fall 10/17/2015  . Risk for falls 10/17/2015  . Recurrent falls 10/17/2015  . Chest pain at rest   . DNR (do not resuscitate) 10/23/2014  . Thrush, oral 10/10/2014  . Sinusitis, chronic 10/07/2014  . Bronchitis, acute 10/06/2014  . Acute kidney injury ([place])   . Atrial fibrillation ([place])   . Acute on chronic diastolic heart failure ([place]) 10/04/2014  . Chest pain 10/04/2014  . New onset atrial fibrillation ([place]) 10/04/2014  . COPD exacerbation ([place]) 10/04/2014  . COLD (chronic obstructive lung disease) ([place])   . 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 GI bleed   . Acute on chronic diastolic CHF (congestive heart failure) ([place]) 08/07/2013  . COPD (chronic obstructive pulmonary disease) ([place]) 08/07/2013  . CKD (chronic kidney disease), stage III 08/07/2013  . Acute blood loss anemia   . Elevated serum creatinine 07/11/2013  . Chronic respiratory failure ([place]) 07/11/2013  . Chronic diastolic CHF (congestive heart failure) ([place])   .  CAD (coronary artery disease)   . HTN (hypertension)   . HLD (hyperlipidemia)   . History of acute myocardial infarction 01/06/2012  . Type II diabetes mellitus with renal manifestations ([place]) 01/06/2012  . Obstructive chronic bronchitis without exacerbation ([place]) 10/16/2010  . Syncope 08/06/2010  . DIZZINESS 08/06/2010  . CAD, NATIVE VESSEL 01/27/2010  . CAD, ARTERY BYPASS GRAFT 01/27/2010  . OBESITY 01/24/2009    Past Surgical History:  Procedure Laterality Date  . [place]   "when I was in the [place]" (07/11/2013)  . CORONARY ANGIOPLASTY WITH STENT PLACEMENT  12/2011  . CORONARY ARTERY BYPASS GRAFT  1995   CABG X3  . ESOPHAGOGASTRODUODENOSCOPY N/A 07/12/2013   Procedure:  ESOPHAGOGASTRODUODENOSCOPY (EGD);  Surgeon: Inda Castle, MD;  Location: [place];  Service: Endoscopy;  Laterality: N/A;  . EXCISIONAL HEMORRHOIDECTOMY     "twice cut them out; burnt them out once"  . FUNCTIONAL ENDOSCOPIC SINUS SURGERY  2006  . HYDROCELE EXCISION  2005  . INGUINAL HERNIA REPAIR Right 2006  . KNEE ARTHROSCOPY Right 1990's  . LEFT HEART CATHETERIZATION WITH CORONARY/GRAFT ANGIOGRAM  01/07/2012   Procedure: LEFT HEART CATHETERIZATION WITH Beatrix Fetters;  Surgeon: Peter M Martinique, MD;  Location: [facility] CATH LAB;  Service: Cardiovascular;;  . PACEMAKER IMPLANT N/A 12/14/2017   Procedure: PACEMAKER IMPLANT;  Surgeon: Constance Haw, MD;  Location: [place] CV LAB;  Service: Cardiovascular;  Laterality: N/A;  . PERCUTANEOUS CORONARY STENT INTERVENTION (PCI-S) N/A 01/10/2012   Procedure: PERCUTANEOUS CORONARY STENT INTERVENTION (PCI-S);  Surgeon: Peter M Martinique, MD;  Location: [facility] CATH LAB;  Service: Cardiovascular;  Laterality: N/A;  . PROSTATE SURGERY  1998       Family History  Problem Relation Age of Onset  . CAD Father 58    Social History   Tobacco Use  . Smoking status: Never Smoker  . Smokeless tobacco: Never Used  Substance Use Topics  . Alcohol use: Yes    Comment: prior alcohol, no sig use now  . Drug use: No    Home Medications Prior to Admission medications   Medication Sig Start Date End Date Taking? Authorizing Provider  albuterol (PROVENTIL HFA;VENTOLIN HFA) 108 (90 BASE) MCG/ACT inhaler Inhale 2 puffs into the lungs every 6 (six) hours as needed for wheezing or shortness of breath.   Yes [provider]  albuterol (PROVENTIL) (2.5 MG/3ML) 0.083% nebulizer solution Take 3 mLs (2.5 mg total) by nebulization every 6 (six) hours as needed for wheezing. 07/05/13  Yes Bonnielee Haff, MD  ARTIFICIAL TEAR OP Place 2 drops into both eyes daily as needed. For dry eyes   Yes [provider]  aspirin 81 MG tablet Take 1  tablet (81 mg total) by mouth daily. 08/10/13  Yes Dunn, Dayna N, PA-C  atorvastatin (LIPITOR) 80 MG tablet Take 1 tablet (80 mg total) by mouth daily at 6 PM. Please make overdue appt with Dr. Acie Fredrickson before anymore refills. 2nd attempt 01/31/18  Yes Nahser, Wonda Cheng, MD  budesonide-formoterol [facility]) 160-4.5 MCG/ACT inhaler Inhale 2 puffs into the lungs 2 (two) times daily.   Yes [provider]  clopidogrel (PLAVIX) 75 MG tablet Take 1 tablet (75 mg total) by mouth daily with breakfast. Please make overdue appt with Dr. Acie Fredrickson before anymore refills. 3rd attempt 04/21/18  Yes Nahser, Wonda Cheng, MD  fluticasone [facility]) 50 MCG/ACT nasal spray Place 2 sprays into both nostrils daily. 10/13/14  Yes Short, Noah Delaine, MD  furosemide (LASIX) 40 MG tablet Take  1.5 tablets (60 mg total) by mouth 2 (two) times daily. 06/19/15  Yes Nahser, Wonda Cheng, MD  glimepiride (AMARYL) 2 MG tablet Take 2 mg by mouth daily with breakfast.   Yes Lawerance Cruel, MD  isosorbide mononitrate (IMDUR) 30 MG 24 hr tablet Take 1 tablet (30 mg total) by mouth daily. Please make overdue appt with Dr. Acie Fredrickson before anymore refills. 3rd and Final attempt 03/21/18  Yes Nahser, Wonda Cheng, MD  montelukast (SINGULAIR) 10 MG tablet Take 10 mg by mouth at bedtime.   Yes [provider]  Multiple Vitamins-Minerals (MULTIVITAMIN & MINERAL PO) Take 1 tablet by mouth daily.     Yes [provider]  nitroGLYCERIN (NITROSTAT) 0.4 MG SL tablet Place 1 tablet (0.4 mg total) under the tongue every 5 (five) minutes as needed for chest pain. 11/02/17  Yes Nahser, Wonda Cheng, MD  pantoprazole (PROTONIX) 40 MG tablet TAKE 1 TABLET BY MOUTH  DAILY 11/21/18  Yes Nahser, Wonda Cheng, MD  potassium chloride SA (K-DUR,KLOR-CON) 20 MEQ tablet Take 1 tablet (20 mEq total) by mouth 2 (two) times daily. Please make overdue appt with Dr.Nahser. 3rd and Final attempt 12/22/17  Yes Nahser, Wonda Cheng, MD  tiotropium (SPIRIVA) 18 MCG inhalation capsule  Place 1 capsule (18 mcg total) into inhaler and inhale daily. 07/05/13  Yes Bonnielee Haff, MD    Allergies    Other  Review of Systems   Review of Systems  Unable to perform ROS: Mental status change    Physical Exam Updated Vital Signs BP (!) 156/83   Pulse 94   Temp 98.5 F (36.9 C) (Axillary)   Resp 16   Ht 5\' 11"  (1.803 m)   Wt 90.7 kg   SpO2 98%   BMI 27.89 kg/m   Physical Exam Vitals and nursing note reviewed.  Constitutional:      Appearance: He is well-developed.     Comments: Answers questions reasonably well.  HENT:     Head: Normocephalic and atraumatic.     Comments: No facial asymmetry Eyes:     Conjunctiva/sclera: Conjunctivae normal.  Cardiovascular:     Rate and Rhythm: Normal rate and regular rhythm.  Pulmonary:     Effort: Pulmonary effort is normal.     Breath sounds: Normal breath sounds.  Abdominal:     General: Bowel sounds are normal.     Palpations: Abdomen is soft.  Musculoskeletal:        General: Normal range of motion.     Cervical back: Neck supple.  Skin:    General: Skin is warm and dry.  Neurological:     General: No focal deficit present.     Mental Status: He is alert.     Comments: Able to move his arms and legs appropriately.  Psychiatric:        Behavior: Behavior normal.     ED Results / Procedures / Treatments   Labs (all labs ordered are listed, but only abnormal results are displayed) Labs Reviewed  CBC WITH DIFFERENTIAL/PLATELET - Abnormal; Notable for the following components:      Result Value   HCT 53.2 (*)    Neutro Abs 8.4 (*)    All other components within normal limits  COMPREHENSIVE METABOLIC PANEL - Abnormal; Notable for the following components:   BUN 34 (*)    Creatinine, Ser 1.58 (*)    Calcium 8.6 (*)    AST 54 (*)    Total Bilirubin 1.4 (*)  GFR calc non Af Amer 39 (*)    GFR calc Af Amer 45 (*)    All other components within normal limits  URINALYSIS, ROUTINE W REFLEX MICROSCOPIC -  Abnormal; Notable for the following components:   Hgb urine dipstick LARGE (*)    Ketones, ur 20 (*)    Protein, ur 100 (*)    All other components within normal limits  SARS CORONAVIRUS 2 (TAT 6-24 HRS)    EKG None  Radiology No results found.  Procedures Procedures (including critical care time)  Medications Ordered in ED Medications  sodium chloride 0.9 % bolus 500 mL (0 mLs Intravenous Stopped 09/21/19 1339)  sodium chloride 0.9 % bolus 500 mL (0 mLs Intravenous Stopped 09/21/19 1450)    ED Course  I have reviewed the triage vital signs and the nursing notes.  Pertinent labs & imaging results that were available during my care of the patient were reviewed by me and considered in my medical decision making (see chart for details).    MDM Rules/Calculators/A&P                      Patient was observed for several hours in the ED.  Screening labs reasonable.  Creatinine slightly bumped at 1.58.  Ketones noted in the urine.  Likely dehydration.  Patient was given IV fluids and was hemodynamically stable at discharge.  I discussed his care with the son with the phone number on his demographic sheet.  Patient will be discharged home and follow-up with primary care. Final Clinical Impression(s) / ED Diagnoses Final diagnoses:  Fall, initial encounter  Weakness    Rx / DC Orders ED Discharge Orders    None       Nat Christen, MD 10/01/19 [phone]

## 2019-10-15 ENCOUNTER — Telehealth: Payer: Self-pay

## 2019-10-15 DIAGNOSIS — I2510 Atherosclerotic heart disease of native coronary artery without angina pectoris: Secondary | ICD-10-CM

## 2019-10-15 MED ORDER — CLOPIDOGREL BISULFATE 75 MG PO TABS: 75.0000 mg | ORAL_TABLET | Freq: Every day | ORAL | 0 refills | Status: AC

## 2019-10-15 MED ORDER — ISOSORBIDE MONONITRATE ER 30 MG PO TB24: 30.0000 mg | ORAL_TABLET | Freq: Every day | ORAL | 3 refills | Status: AC

## 2019-10-15 MED ORDER — ATORVASTATIN CALCIUM 80 MG PO TABS: 80.0000 mg | ORAL_TABLET | Freq: Every day | ORAL | 3 refills | Status: AC

## 2019-10-15 NOTE — Telephone Encounter (Signed)
Daughter calling in to see if patient should be taking lipitor, plavix, imdur because his last refills were in 2019 for 15 tabs only. There is no medication list in his last televisit appointment to go by. Daughter Jerilynn Birkenhead) would like a call back to discuss. [phone]  I called daughter in response to message above. Lelon Frohlich states that her father is now living with his son and they are trying to make sure he has all of his medications. She found empty bottles for atorvastatin, isosorbide, and clopidogrel. I advised that patient should be taking those medications and verified patient's pharmacy. She gave me updated contact information for the patient and thanked me for my help.

## 2019-10-24 ENCOUNTER — Ambulatory Visit (INDEPENDENT_AMBULATORY_CARE_PROVIDER_SITE_OTHER): Payer: Medicare Other | Admitting: *Deleted

## 2019-10-24 DIAGNOSIS — R55 Syncope and collapse: Secondary | ICD-10-CM | POA: Diagnosis not present

## 2019-10-24 LAB — CUP PACEART REMOTE DEVICE CHECK
Battery Remaining Longevity: 112 mo
Battery Voltage: 3 V
Brady Statistic AP VP Percent: 2.1 %
Brady Statistic AP VS Percent: 0.83 %
Brady Statistic AS VP Percent: 87.44 %
Brady Statistic AS VS Percent: 9.63 %
Brady Statistic RA Percent Paced: 2.67 %
Brady Statistic RV Percent Paced: 89.54 %
Date Time Interrogation Session: 20210407020445
Implantable Lead Implant Date: 20190529
Implantable Lead Implant Date: 20190529
Implantable Lead Location: 753859
Implantable Lead Location: 753860
Implantable Lead Model: 5076
Implantable Lead Model: 5076
Implantable Pulse Generator Implant Date: 20190529
Lead Channel Impedance Value: 266 Ohm
Lead Channel Impedance Value: 342 Ohm
Lead Channel Impedance Value: 437 Ohm
Lead Channel Impedance Value: 494 Ohm
Lead Channel Pacing Threshold Amplitude: 0.5 V
Lead Channel Pacing Threshold Amplitude: 0.875 V
Lead Channel Pacing Threshold Pulse Width: 0.4 ms
Lead Channel Pacing Threshold Pulse Width: 0.4 ms
Lead Channel Sensing Intrinsic Amplitude: 1.375 mV
Lead Channel Sensing Intrinsic Amplitude: 1.375 mV
Lead Channel Sensing Intrinsic Amplitude: 23 mV
Lead Channel Sensing Intrinsic Amplitude: 23 mV
Lead Channel Setting Pacing Amplitude: 2 V
Lead Channel Setting Pacing Amplitude: 2.5 V
Lead Channel Setting Pacing Pulse Width: 0.4 ms
Lead Channel Setting Sensing Sensitivity: 2 mV

## 2019-10-24 NOTE — Progress Notes (Signed)
PPM Remote  

## 2019-11-21 ENCOUNTER — Encounter: Payer: Self-pay | Admitting: *Deleted

## 2020-01-23 ENCOUNTER — Ambulatory Visit (INDEPENDENT_AMBULATORY_CARE_PROVIDER_SITE_OTHER): Payer: Medicare Other | Admitting: *Deleted

## 2020-01-23 DIAGNOSIS — I441 Atrioventricular block, second degree: Secondary | ICD-10-CM

## 2020-01-23 DIAGNOSIS — R55 Syncope and collapse: Secondary | ICD-10-CM

## 2020-01-23 LAB — CUP PACEART REMOTE DEVICE CHECK
Battery Remaining Longevity: 112 mo
Battery Voltage: 3 V
Brady Statistic AP VP Percent: 2 %
Brady Statistic AP VS Percent: 1.5 %
Brady Statistic AS VP Percent: 71.76 %
Brady Statistic AS VS Percent: 24.74 %
Brady Statistic RA Percent Paced: 3.11 %
Brady Statistic RV Percent Paced: 73.76 %
Date Time Interrogation Session: 20210707020635
Implantable Lead Implant Date: 20190529
Implantable Lead Implant Date: 20190529
Implantable Lead Location: 753859
Implantable Lead Location: 753860
Implantable Lead Model: 5076
Implantable Lead Model: 5076
Implantable Pulse Generator Implant Date: 20190529
Lead Channel Impedance Value: 266 Ohm
Lead Channel Impedance Value: 361 Ohm
Lead Channel Impedance Value: 456 Ohm
Lead Channel Impedance Value: 475 Ohm
Lead Channel Pacing Threshold Amplitude: 0.5 V
Lead Channel Pacing Threshold Amplitude: 0.875 V
Lead Channel Pacing Threshold Pulse Width: 0.4 ms
Lead Channel Pacing Threshold Pulse Width: 0.4 ms
Lead Channel Sensing Intrinsic Amplitude: 0.875 mV
Lead Channel Sensing Intrinsic Amplitude: 0.875 mV
Lead Channel Sensing Intrinsic Amplitude: 19.625 mV
Lead Channel Sensing Intrinsic Amplitude: 19.625 mV
Lead Channel Setting Pacing Amplitude: 2 V
Lead Channel Setting Pacing Amplitude: 2.5 V
Lead Channel Setting Pacing Pulse Width: 0.4 ms
Lead Channel Setting Sensing Sensitivity: 2 mV

## 2020-01-25 NOTE — Progress Notes (Signed)
Remote pacemaker transmission.   

## 2020-04-23 ENCOUNTER — Ambulatory Visit (INDEPENDENT_AMBULATORY_CARE_PROVIDER_SITE_OTHER): Payer: Medicare Other

## 2020-04-23 DIAGNOSIS — R55 Syncope and collapse: Secondary | ICD-10-CM

## 2020-04-23 DIAGNOSIS — I441 Atrioventricular block, second degree: Secondary | ICD-10-CM

## 2020-04-23 LAB — CUP PACEART REMOTE DEVICE CHECK
Battery Remaining Longevity: 108 mo
Battery Voltage: 2.99 V
Brady Statistic AP VP Percent: 1.24 %
Brady Statistic AP VS Percent: 0.2 %
Brady Statistic AS VP Percent: 96.17 %
Brady Statistic AS VS Percent: 2.39 %
Brady Statistic RA Percent Paced: 1.37 %
Brady Statistic RV Percent Paced: 97.41 %
Date Time Interrogation Session: 20211006020617
Implantable Lead Implant Date: 20190529
Implantable Lead Implant Date: 20190529
Implantable Lead Location: 753859
Implantable Lead Location: 753860
Implantable Lead Model: 5076
Implantable Lead Model: 5076
Implantable Pulse Generator Implant Date: 20190529
Lead Channel Impedance Value: 285 Ohm
Lead Channel Impedance Value: 380 Ohm
Lead Channel Impedance Value: 456 Ohm
Lead Channel Impedance Value: 475 Ohm
Lead Channel Pacing Threshold Amplitude: 0.5 V
Lead Channel Pacing Threshold Amplitude: 0.875 V
Lead Channel Pacing Threshold Pulse Width: 0.4 ms
Lead Channel Pacing Threshold Pulse Width: 0.4 ms
Lead Channel Sensing Intrinsic Amplitude: 1.25 mV
Lead Channel Sensing Intrinsic Amplitude: 1.25 mV
Lead Channel Sensing Intrinsic Amplitude: 20 mV
Lead Channel Sensing Intrinsic Amplitude: 20 mV
Lead Channel Setting Pacing Amplitude: 2 V
Lead Channel Setting Pacing Amplitude: 2.5 V
Lead Channel Setting Pacing Pulse Width: 0.4 ms
Lead Channel Setting Sensing Sensitivity: 2 mV

## 2020-04-28 NOTE — Progress Notes (Signed)
Remote pacemaker transmission.   

## 2020-07-19 DEATH — deceased
